# Patient Record
Sex: Female | Born: 1975 | Race: Black or African American | Hispanic: No | Smoking: Never smoker
Health system: Southern US, Community
[De-identification: ages and names within clinical notes are randomized; demographics above are authoritative.]

## PROBLEM LIST (undated history)

## (undated) ENCOUNTER — Inpatient Hospital Stay (HOSPITAL_COMMUNITY): Payer: Self-pay

## (undated) DIAGNOSIS — G8929 Other chronic pain: Secondary | ICD-10-CM

## (undated) DIAGNOSIS — E282 Polycystic ovarian syndrome: Secondary | ICD-10-CM

## (undated) DIAGNOSIS — R42 Dizziness and giddiness: Secondary | ICD-10-CM

## (undated) DIAGNOSIS — D219 Benign neoplasm of connective and other soft tissue, unspecified: Secondary | ICD-10-CM

## (undated) DIAGNOSIS — D373 Neoplasm of uncertain behavior of appendix: Secondary | ICD-10-CM

## (undated) DIAGNOSIS — O009 Unspecified ectopic pregnancy without intrauterine pregnancy: Secondary | ICD-10-CM

## (undated) DIAGNOSIS — N760 Acute vaginitis: Secondary | ICD-10-CM

## (undated) DIAGNOSIS — N939 Abnormal uterine and vaginal bleeding, unspecified: Secondary | ICD-10-CM

## (undated) DIAGNOSIS — T7840XA Allergy, unspecified, initial encounter: Secondary | ICD-10-CM

## (undated) DIAGNOSIS — B029 Zoster without complications: Secondary | ICD-10-CM

## (undated) DIAGNOSIS — A749 Chlamydial infection, unspecified: Secondary | ICD-10-CM

## (undated) DIAGNOSIS — M25562 Pain in left knee: Secondary | ICD-10-CM

## (undated) DIAGNOSIS — IMO0002 Reserved for concepts with insufficient information to code with codable children: Secondary | ICD-10-CM

## (undated) DIAGNOSIS — B9689 Other specified bacterial agents as the cause of diseases classified elsewhere: Secondary | ICD-10-CM

## (undated) HISTORY — PX: OTHER SURGICAL HISTORY: SHX169

## (undated) HISTORY — PX: ECTOPIC PREGNANCY SURGERY: SHX613

## (undated) HISTORY — PX: APPENDECTOMY: SHX54

## (undated) HISTORY — DX: Reserved for concepts with insufficient information to code with codable children: IMO0002

## (undated) HISTORY — PX: EAR TUBE REMOVAL: SHX1486

## (undated) HISTORY — DX: Allergy, unspecified, initial encounter: T78.40XA

## (undated) HISTORY — PX: WISDOM TOOTH EXTRACTION: SHX21

---

## 1998-12-29 ENCOUNTER — Other Ambulatory Visit: Admission: RE | Admit: 1998-12-29 | Discharge: 1998-12-29 | Payer: Self-pay | Admitting: Family Medicine

## 1999-08-21 ENCOUNTER — Other Ambulatory Visit: Admission: RE | Admit: 1999-08-21 | Discharge: 1999-08-21 | Payer: Self-pay | Admitting: Family Medicine

## 1999-10-11 ENCOUNTER — Other Ambulatory Visit: Admission: RE | Admit: 1999-10-11 | Discharge: 1999-10-11 | Payer: Self-pay | Admitting: Obstetrics and Gynecology

## 1999-10-11 ENCOUNTER — Encounter (INDEPENDENT_AMBULATORY_CARE_PROVIDER_SITE_OTHER): Payer: Self-pay

## 2000-01-11 ENCOUNTER — Encounter: Payer: Self-pay | Admitting: *Deleted

## 2000-01-11 ENCOUNTER — Inpatient Hospital Stay (HOSPITAL_COMMUNITY): Admission: AD | Admit: 2000-01-11 | Discharge: 2000-01-11 | Payer: Self-pay | Admitting: Obstetrics & Gynecology

## 2000-03-27 ENCOUNTER — Ambulatory Visit (HOSPITAL_COMMUNITY): Admission: RE | Admit: 2000-03-27 | Discharge: 2000-03-27 | Payer: Self-pay | Admitting: *Deleted

## 2000-03-29 ENCOUNTER — Inpatient Hospital Stay (HOSPITAL_COMMUNITY): Admission: AD | Admit: 2000-03-29 | Discharge: 2000-03-29 | Payer: Self-pay | Admitting: Obstetrics & Gynecology

## 2000-03-30 ENCOUNTER — Inpatient Hospital Stay (HOSPITAL_COMMUNITY): Admission: AD | Admit: 2000-03-30 | Discharge: 2000-03-30 | Payer: Self-pay | Admitting: Obstetrics

## 2000-04-25 ENCOUNTER — Inpatient Hospital Stay (HOSPITAL_COMMUNITY): Admission: AD | Admit: 2000-04-25 | Discharge: 2000-04-27 | Payer: Self-pay | Admitting: *Deleted

## 2001-04-23 ENCOUNTER — Emergency Department (HOSPITAL_COMMUNITY): Admission: EM | Admit: 2001-04-23 | Discharge: 2001-04-23 | Payer: Self-pay | Admitting: Emergency Medicine

## 2001-04-24 ENCOUNTER — Emergency Department (HOSPITAL_COMMUNITY): Admission: EM | Admit: 2001-04-24 | Discharge: 2001-04-24 | Payer: Self-pay | Admitting: Emergency Medicine

## 2001-08-31 ENCOUNTER — Encounter: Payer: Self-pay | Admitting: Obstetrics and Gynecology

## 2001-08-31 ENCOUNTER — Inpatient Hospital Stay (HOSPITAL_COMMUNITY): Admission: AD | Admit: 2001-08-31 | Discharge: 2001-08-31 | Payer: Self-pay | Admitting: *Deleted

## 2001-12-14 ENCOUNTER — Emergency Department (HOSPITAL_COMMUNITY): Admission: EM | Admit: 2001-12-14 | Discharge: 2001-12-14 | Payer: Self-pay | Admitting: Physical Therapy

## 2002-02-10 ENCOUNTER — Emergency Department (HOSPITAL_COMMUNITY): Admission: EM | Admit: 2002-02-10 | Discharge: 2002-02-10 | Payer: Self-pay | Admitting: *Deleted

## 2002-11-22 ENCOUNTER — Emergency Department (HOSPITAL_COMMUNITY): Admission: EM | Admit: 2002-11-22 | Discharge: 2002-11-22 | Payer: Self-pay | Admitting: Emergency Medicine

## 2002-12-30 ENCOUNTER — Encounter: Admission: RE | Admit: 2002-12-30 | Discharge: 2002-12-30 | Payer: Self-pay | Admitting: Family Medicine

## 2003-05-17 ENCOUNTER — Emergency Department (HOSPITAL_COMMUNITY): Admission: EM | Admit: 2003-05-17 | Discharge: 2003-05-17 | Payer: Self-pay | Admitting: Emergency Medicine

## 2004-02-18 ENCOUNTER — Emergency Department (HOSPITAL_COMMUNITY): Admission: EM | Admit: 2004-02-18 | Discharge: 2004-02-19 | Payer: Self-pay | Admitting: Emergency Medicine

## 2004-05-18 ENCOUNTER — Emergency Department (HOSPITAL_COMMUNITY): Admission: EM | Admit: 2004-05-18 | Discharge: 2004-05-18 | Payer: Self-pay | Admitting: Emergency Medicine

## 2004-09-05 ENCOUNTER — Inpatient Hospital Stay (HOSPITAL_COMMUNITY): Admission: AD | Admit: 2004-09-05 | Discharge: 2004-09-06 | Payer: Self-pay | Admitting: Family Medicine

## 2004-10-27 ENCOUNTER — Emergency Department (HOSPITAL_COMMUNITY): Admission: EM | Admit: 2004-10-27 | Discharge: 2004-10-27 | Payer: Self-pay | Admitting: Emergency Medicine

## 2004-12-03 ENCOUNTER — Emergency Department (HOSPITAL_COMMUNITY): Admission: EM | Admit: 2004-12-03 | Discharge: 2004-12-03 | Payer: Self-pay | Admitting: Emergency Medicine

## 2005-02-23 ENCOUNTER — Emergency Department (HOSPITAL_COMMUNITY): Admission: EM | Admit: 2005-02-23 | Discharge: 2005-02-24 | Payer: Self-pay | Admitting: Emergency Medicine

## 2005-04-02 ENCOUNTER — Emergency Department (HOSPITAL_COMMUNITY): Admission: EM | Admit: 2005-04-02 | Discharge: 2005-04-02 | Payer: Self-pay | Admitting: *Deleted

## 2005-04-06 ENCOUNTER — Emergency Department (HOSPITAL_COMMUNITY): Admission: EM | Admit: 2005-04-06 | Discharge: 2005-04-06 | Payer: Self-pay | Admitting: *Deleted

## 2005-06-26 ENCOUNTER — Emergency Department (HOSPITAL_COMMUNITY): Admission: EM | Admit: 2005-06-26 | Discharge: 2005-06-26 | Payer: Self-pay | Admitting: Emergency Medicine

## 2005-09-13 ENCOUNTER — Emergency Department (HOSPITAL_COMMUNITY): Admission: EM | Admit: 2005-09-13 | Discharge: 2005-09-13 | Payer: Self-pay | Admitting: Emergency Medicine

## 2005-09-20 ENCOUNTER — Ambulatory Visit: Payer: Self-pay | Admitting: Internal Medicine

## 2005-10-21 ENCOUNTER — Emergency Department (HOSPITAL_COMMUNITY): Admission: EM | Admit: 2005-10-21 | Discharge: 2005-10-22 | Payer: Self-pay | Admitting: Emergency Medicine

## 2006-02-27 ENCOUNTER — Ambulatory Visit: Payer: Self-pay | Admitting: Internal Medicine

## 2006-02-27 ENCOUNTER — Encounter (INDEPENDENT_AMBULATORY_CARE_PROVIDER_SITE_OTHER): Payer: Self-pay | Admitting: Pulmonary Disease

## 2006-02-27 ENCOUNTER — Encounter (INDEPENDENT_AMBULATORY_CARE_PROVIDER_SITE_OTHER): Payer: Self-pay | Admitting: Specialist

## 2006-02-27 LAB — CONVERTED CEMR LAB
Candida species: NEGATIVE
GC Probe Amp, Genital: NEGATIVE
Gardnerella vaginalis: POSITIVE — AB
Trichomonal Vaginitis: NEGATIVE

## 2006-03-01 ENCOUNTER — Emergency Department (HOSPITAL_COMMUNITY): Admission: EM | Admit: 2006-03-01 | Discharge: 2006-03-01 | Payer: Self-pay | Admitting: Emergency Medicine

## 2006-03-18 ENCOUNTER — Ambulatory Visit: Payer: Self-pay | Admitting: Hospitalist

## 2006-03-18 ENCOUNTER — Encounter (INDEPENDENT_AMBULATORY_CARE_PROVIDER_SITE_OTHER): Payer: Self-pay | Admitting: Unknown Physician Specialty

## 2006-03-18 LAB — CONVERTED CEMR LAB: Preg, Serum: NEGATIVE

## 2006-05-16 DIAGNOSIS — H6690 Otitis media, unspecified, unspecified ear: Secondary | ICD-10-CM | POA: Insufficient documentation

## 2006-06-10 DIAGNOSIS — N760 Acute vaginitis: Secondary | ICD-10-CM | POA: Insufficient documentation

## 2006-09-02 ENCOUNTER — Emergency Department (HOSPITAL_COMMUNITY): Admission: EM | Admit: 2006-09-02 | Discharge: 2006-09-02 | Payer: Self-pay | Admitting: Emergency Medicine

## 2006-10-02 ENCOUNTER — Emergency Department (HOSPITAL_COMMUNITY): Admission: EM | Admit: 2006-10-02 | Discharge: 2006-10-02 | Payer: Self-pay | Admitting: Emergency Medicine

## 2006-12-10 ENCOUNTER — Encounter (INDEPENDENT_AMBULATORY_CARE_PROVIDER_SITE_OTHER): Payer: Self-pay | Admitting: Nurse Practitioner

## 2006-12-10 ENCOUNTER — Ambulatory Visit: Payer: Self-pay | Admitting: Internal Medicine

## 2006-12-11 ENCOUNTER — Ambulatory Visit: Payer: Self-pay | Admitting: *Deleted

## 2007-04-10 ENCOUNTER — Emergency Department (HOSPITAL_COMMUNITY): Admission: EM | Admit: 2007-04-10 | Discharge: 2007-04-11 | Payer: Self-pay | Admitting: Emergency Medicine

## 2007-06-17 ENCOUNTER — Emergency Department (HOSPITAL_COMMUNITY): Admission: EM | Admit: 2007-06-17 | Discharge: 2007-06-17 | Payer: Self-pay | Admitting: Emergency Medicine

## 2007-07-12 ENCOUNTER — Emergency Department (HOSPITAL_COMMUNITY): Admission: EM | Admit: 2007-07-12 | Discharge: 2007-07-12 | Payer: Self-pay | Admitting: Emergency Medicine

## 2007-07-30 ENCOUNTER — Emergency Department (HOSPITAL_COMMUNITY): Admission: EM | Admit: 2007-07-30 | Discharge: 2007-07-30 | Payer: Self-pay | Admitting: Emergency Medicine

## 2007-12-26 ENCOUNTER — Emergency Department (HOSPITAL_COMMUNITY): Admission: EM | Admit: 2007-12-26 | Discharge: 2007-12-26 | Payer: Self-pay | Admitting: Emergency Medicine

## 2008-01-05 ENCOUNTER — Emergency Department (HOSPITAL_COMMUNITY): Admission: EM | Admit: 2008-01-05 | Discharge: 2008-01-05 | Payer: Self-pay | Admitting: Emergency Medicine

## 2008-01-09 ENCOUNTER — Ambulatory Visit: Payer: Self-pay | Admitting: Internal Medicine

## 2008-01-10 ENCOUNTER — Encounter (INDEPENDENT_AMBULATORY_CARE_PROVIDER_SITE_OTHER): Payer: Self-pay | Admitting: Internal Medicine

## 2008-03-17 ENCOUNTER — Emergency Department (HOSPITAL_COMMUNITY): Admission: EM | Admit: 2008-03-17 | Discharge: 2008-03-17 | Payer: Self-pay | Admitting: Emergency Medicine

## 2008-08-30 DIAGNOSIS — IMO0002 Reserved for concepts with insufficient information to code with codable children: Secondary | ICD-10-CM

## 2008-08-30 HISTORY — DX: Reserved for concepts with insufficient information to code with codable children: IMO0002

## 2008-11-03 ENCOUNTER — Ambulatory Visit: Payer: Self-pay | Admitting: Obstetrics and Gynecology

## 2008-11-03 ENCOUNTER — Encounter: Payer: Self-pay | Admitting: Obstetrics & Gynecology

## 2008-11-03 ENCOUNTER — Other Ambulatory Visit: Admission: RE | Admit: 2008-11-03 | Discharge: 2008-11-03 | Payer: Self-pay | Admitting: Obstetrics and Gynecology

## 2008-11-03 LAB — CONVERTED CEMR LAB: GC Probe Amp, Genital: NEGATIVE

## 2008-12-03 ENCOUNTER — Ambulatory Visit: Payer: Self-pay | Admitting: Obstetrics & Gynecology

## 2009-01-31 ENCOUNTER — Emergency Department (HOSPITAL_COMMUNITY): Admission: EM | Admit: 2009-01-31 | Discharge: 2009-01-31 | Payer: Self-pay | Admitting: Emergency Medicine

## 2009-02-05 ENCOUNTER — Emergency Department (HOSPITAL_COMMUNITY): Admission: EM | Admit: 2009-02-05 | Discharge: 2009-02-05 | Payer: Self-pay | Admitting: Emergency Medicine

## 2009-02-23 ENCOUNTER — Ambulatory Visit: Payer: Self-pay | Admitting: Obstetrics and Gynecology

## 2009-02-24 ENCOUNTER — Encounter: Payer: Self-pay | Admitting: Obstetrics and Gynecology

## 2009-02-24 LAB — CONVERTED CEMR LAB
Trich, Wet Prep: NONE SEEN
Yeast Wet Prep HPF POC: NONE SEEN

## 2009-03-17 ENCOUNTER — Ambulatory Visit: Payer: Self-pay | Admitting: Obstetrics and Gynecology

## 2009-03-18 ENCOUNTER — Encounter: Payer: Self-pay | Admitting: Obstetrics and Gynecology

## 2009-03-18 LAB — CONVERTED CEMR LAB
Chlamydia, DNA Probe: NEGATIVE
HCV Ab: NEGATIVE
Hepatitis B Surface Ag: NEGATIVE

## 2009-03-19 ENCOUNTER — Encounter: Payer: Self-pay | Admitting: Obstetrics and Gynecology

## 2009-03-19 LAB — CONVERTED CEMR LAB: Yeast Wet Prep HPF POC: NONE SEEN

## 2009-04-29 ENCOUNTER — Emergency Department (HOSPITAL_COMMUNITY): Admission: EM | Admit: 2009-04-29 | Discharge: 2009-04-29 | Payer: Self-pay | Admitting: Emergency Medicine

## 2009-05-14 HISTORY — PX: OTHER SURGICAL HISTORY: SHX169

## 2009-08-11 ENCOUNTER — Ambulatory Visit: Payer: Self-pay | Admitting: Obstetrics and Gynecology

## 2009-08-11 LAB — CONVERTED CEMR LAB: Pap Smear: NEGATIVE

## 2009-08-12 ENCOUNTER — Encounter: Payer: Self-pay | Admitting: Obstetrics and Gynecology

## 2009-08-12 LAB — CONVERTED CEMR LAB
Trich, Wet Prep: NONE SEEN
Yeast Wet Prep HPF POC: NONE SEEN

## 2009-08-31 ENCOUNTER — Emergency Department (HOSPITAL_COMMUNITY): Admission: EM | Admit: 2009-08-31 | Discharge: 2009-08-31 | Payer: Self-pay | Admitting: Emergency Medicine

## 2009-11-03 ENCOUNTER — Emergency Department (HOSPITAL_COMMUNITY): Admission: EM | Admit: 2009-11-03 | Discharge: 2009-11-03 | Payer: Self-pay | Admitting: Emergency Medicine

## 2009-11-23 ENCOUNTER — Emergency Department (HOSPITAL_COMMUNITY): Admission: EM | Admit: 2009-11-23 | Discharge: 2009-11-23 | Payer: Self-pay | Admitting: Emergency Medicine

## 2010-03-22 ENCOUNTER — Ambulatory Visit: Payer: Self-pay | Admitting: Obstetrics and Gynecology

## 2010-03-24 ENCOUNTER — Ambulatory Visit (HOSPITAL_COMMUNITY): Admission: RE | Admit: 2010-03-24 | Discharge: 2010-03-24 | Payer: Self-pay | Admitting: Family Medicine

## 2010-04-04 ENCOUNTER — Emergency Department (HOSPITAL_COMMUNITY): Admission: EM | Admit: 2010-04-04 | Discharge: 2010-04-04 | Payer: Self-pay | Admitting: Emergency Medicine

## 2010-04-16 ENCOUNTER — Emergency Department (HOSPITAL_COMMUNITY)
Admission: EM | Admit: 2010-04-16 | Discharge: 2010-04-16 | Payer: Self-pay | Source: Home / Self Care | Admitting: Emergency Medicine

## 2010-05-10 ENCOUNTER — Encounter (INDEPENDENT_AMBULATORY_CARE_PROVIDER_SITE_OTHER): Payer: Self-pay | Admitting: *Deleted

## 2010-05-10 ENCOUNTER — Ambulatory Visit: Payer: Self-pay | Admitting: Obstetrics and Gynecology

## 2010-05-10 LAB — CONVERTED CEMR LAB
HIV: NONREACTIVE
Hepatitis B Surface Ag: NEGATIVE
Yeast Wet Prep HPF POC: NONE SEEN

## 2010-05-11 ENCOUNTER — Encounter: Payer: Self-pay | Admitting: Obstetrics and Gynecology

## 2010-06-26 ENCOUNTER — Encounter: Payer: Self-pay | Admitting: Obstetrics and Gynecology

## 2010-06-26 ENCOUNTER — Other Ambulatory Visit: Payer: Self-pay

## 2010-06-26 ENCOUNTER — Ambulatory Visit (INDEPENDENT_AMBULATORY_CARE_PROVIDER_SITE_OTHER): Payer: Medicaid Other | Admitting: Occupational Therapy

## 2010-06-26 ENCOUNTER — Encounter: Payer: Self-pay | Admitting: Physician Assistant

## 2010-06-26 DIAGNOSIS — R5383 Other fatigue: Secondary | ICD-10-CM

## 2010-06-26 DIAGNOSIS — N912 Amenorrhea, unspecified: Secondary | ICD-10-CM

## 2010-06-26 DIAGNOSIS — R635 Abnormal weight gain: Secondary | ICD-10-CM

## 2010-06-26 DIAGNOSIS — R5381 Other malaise: Secondary | ICD-10-CM

## 2010-06-26 LAB — CONVERTED CEMR LAB
FSH: 2.9 milliintl units/mL
HCT: 38.4 % (ref 36.0–46.0)
Hemoglobin: 12.8 g/dL (ref 12.0–15.0)
LH: <0.1 milliintl units/mL
RBC: 4.5 M/uL (ref 3.87–5.11)
RDW: 14.3 % (ref 11.5–15.5)

## 2010-06-26 LAB — GLUCOSE, CAPILLARY: Glucose-Capillary: 111 mg/dL — ABNORMAL HIGH (ref 70–99)

## 2010-06-26 LAB — POCT PREGNANCY, URINE: Preg Test, Ur: NEGATIVE

## 2010-06-27 ENCOUNTER — Encounter: Payer: Self-pay | Admitting: Obstetrics and Gynecology

## 2010-06-27 LAB — CONVERTED CEMR LAB
Trich, Wet Prep: NONE SEEN
Yeast Wet Prep HPF POC: NONE SEEN

## 2010-07-13 ENCOUNTER — Emergency Department (HOSPITAL_COMMUNITY): Payer: Medicaid Other

## 2010-07-13 ENCOUNTER — Emergency Department (HOSPITAL_COMMUNITY)
Admission: EM | Admit: 2010-07-13 | Discharge: 2010-07-13 | Disposition: A | Discharge status: Home / Self Care | Payer: Medicaid Other | Attending: Emergency Medicine | Admitting: Emergency Medicine

## 2010-07-13 DIAGNOSIS — R5381 Other malaise: Secondary | ICD-10-CM | POA: Insufficient documentation

## 2010-07-13 DIAGNOSIS — J069 Acute upper respiratory infection, unspecified: Secondary | ICD-10-CM | POA: Insufficient documentation

## 2010-07-13 DIAGNOSIS — R509 Fever, unspecified: Secondary | ICD-10-CM | POA: Insufficient documentation

## 2010-07-13 DIAGNOSIS — H9209 Otalgia, unspecified ear: Secondary | ICD-10-CM | POA: Insufficient documentation

## 2010-07-13 DIAGNOSIS — J3489 Other specified disorders of nose and nasal sinuses: Secondary | ICD-10-CM | POA: Insufficient documentation

## 2010-07-13 DIAGNOSIS — R110 Nausea: Secondary | ICD-10-CM | POA: Insufficient documentation

## 2010-07-13 DIAGNOSIS — R0989 Other specified symptoms and signs involving the circulatory and respiratory systems: Secondary | ICD-10-CM | POA: Insufficient documentation

## 2010-07-13 DIAGNOSIS — R070 Pain in throat: Secondary | ICD-10-CM | POA: Insufficient documentation

## 2010-07-13 DIAGNOSIS — R059 Cough, unspecified: Secondary | ICD-10-CM | POA: Insufficient documentation

## 2010-07-13 DIAGNOSIS — R0609 Other forms of dyspnea: Secondary | ICD-10-CM | POA: Insufficient documentation

## 2010-07-13 DIAGNOSIS — R51 Headache: Secondary | ICD-10-CM | POA: Insufficient documentation

## 2010-07-13 DIAGNOSIS — R5383 Other fatigue: Secondary | ICD-10-CM | POA: Insufficient documentation

## 2010-07-13 DIAGNOSIS — R05 Cough: Secondary | ICD-10-CM | POA: Insufficient documentation

## 2010-07-13 DIAGNOSIS — B9789 Other viral agents as the cause of diseases classified elsewhere: Secondary | ICD-10-CM | POA: Insufficient documentation

## 2010-07-13 LAB — CBC
HCT: 39 % (ref 36.0–46.0)
MCH: 27.5 pg (ref 26.0–34.0)
MCV: 84.4 fL (ref 78.0–100.0)
RBC: 4.62 MIL/uL (ref 3.87–5.11)
RDW: 14 % (ref 11.5–15.5)
WBC: 5 10*3/uL (ref 4.0–10.5)

## 2010-07-13 LAB — DIFFERENTIAL
Eosinophils Relative: 5 % (ref 0–5)
Lymphocytes Relative: 42 % (ref 12–46)
Lymphs Abs: 2.1 10*3/uL (ref 0.7–4.0)
Monocytes Relative: 10 % (ref 3–12)

## 2010-07-13 LAB — POCT I-STAT, CHEM 8
BUN: 20 mg/dL (ref 6–23)
Chloride: 106 mEq/L (ref 96–112)
Glucose, Bld: 84 mg/dL (ref 70–99)
HCT: 41 % (ref 36.0–46.0)
Potassium: 4.4 mEq/L (ref 3.5–5.1)

## 2010-07-20 ENCOUNTER — Ambulatory Visit (INDEPENDENT_AMBULATORY_CARE_PROVIDER_SITE_OTHER): Payer: Medicaid Other | Admitting: Physician Assistant

## 2010-07-20 DIAGNOSIS — E282 Polycystic ovarian syndrome: Secondary | ICD-10-CM

## 2010-07-21 ENCOUNTER — Ambulatory Visit: Payer: Self-pay | Admitting: Family Medicine

## 2010-07-30 LAB — CBC
Hemoglobin: 12.9 g/dL (ref 12.0–15.0)
MCH: 29.4 pg (ref 26.0–34.0)
MCV: 88.5 fL (ref 78.0–100.0)
RBC: 4.38 MIL/uL (ref 3.87–5.11)

## 2010-07-30 LAB — POCT I-STAT, CHEM 8
Calcium, Ion: 1.1 mmol/L — ABNORMAL LOW (ref 1.12–1.32)
Creatinine, Ser: 0.9 mg/dL (ref 0.4–1.2)
Glucose, Bld: 88 mg/dL (ref 70–99)
Hemoglobin: 13.9 g/dL (ref 12.0–15.0)
TCO2: 27 mmol/L (ref 0–100)

## 2010-07-30 LAB — DIFFERENTIAL
Eosinophils Absolute: 0.1 10*3/uL (ref 0.0–0.7)
Eosinophils Relative: 3 % (ref 0–5)
Lymphs Abs: 1.9 10*3/uL (ref 0.7–4.0)
Monocytes Relative: 8 % (ref 3–12)

## 2010-08-04 NOTE — Progress Notes (Signed)
NAME:  Melissa [name], Melissa [name]               ACCOUNT NO.:  [id number]  MEDICAL RECORD NO.:  [id number]           PATIENT TYPE:  A  [facility]ATION[facility]                   FACILITY:  [facility]  PHYSICIAN:  Maylon Cos, CNM    DATE OF BIRTH:  [birth year]  DATE OF SERVICE:  07/20/2010                                 CLINIC NOTE  The patient is being seen in GYN Clinic at [facility].  Reason for today's visit is 2-week followup for results.  HISTORY OF PRESENT ILLNESS:  The patient is a 35 year old gravida 2, para 1-0-1-1 who originally presented to me 1 month ago with complaints of amenorrhea, weight gain, and fatigue.  At that time, labs were drawn to assess for PCOS.  The patient is strongly desiring pregnancy.  She was given Provera 10 mg for 10 days for withdrawal bleed.  She reports that she did have spotting that lasted for 2 days after she finished the Provera and none since.  The labs that were drawn are resulted as follow:  She had a wet prep that showed many clues.  Gonorrhea and Chlamydia were negative x2.  Her hemoglobin is 12.8, hematocrit is 38.4, her platelets are 309.  Her TSH is 1.5.  Her FSH is 2.9 and LH is less than 0.1.  I have discussed these results with the patient today and the diagnosis is in fact leading towards PCOS as we did suspect.  She is extensively counselled on diet modification due to insulin sensitivity, need for weight loss, and to start medications of metformin.  She is in agreement with this plan.  ASSESSMENT: 1. Polycystic ovary syndrome. 2. Amenorrhea. 3. Weight gain. 4. Fatigue.  PLAN:  The patient is handed information on PCOS, diabetic diet, and exercise today.  Additionally, the patient is being started on metformin 500 mg p.o. at bedtime x7 days and increasing to 500 mg twice a day x10 days with the ultimate goal of increasing to 1000 mg twice a day.  FOLLOWUP:  The patient should follow up in 2 months to assess her weight loss as well as  regulation of her menstrual cycles.  Additionally, the patient is also scheduled to follow up for repeat Pap secondary to history of abnormal Pap.  We will conduct both of those visits into the next with repeat Pap and follow up.  The patient should follow up p.r.n. prior to that if she should have problems.          ______________________________ Maylon Cos, CNM    SS/MEDQ  D:  07/20/2010  T:  07/21/2010  Job:  [id number]

## 2010-08-04 NOTE — Progress Notes (Signed)
NAME:  Melissa [name], Melissa [name]               ACCOUNT NO.:  [id number]  MEDICAL RECORD NO.:  [id number]           PATIENT TYPE:  A  [facility]                   FACILITY:  [facility]  PHYSICIAN:  Maylon Cos, CNM    DATE OF BIRTH:  04/30/1976  DATE OF SERVICE:  06/26/2010                                 CLINIC NOTE  The patient is being seen in GYN Clinic at [facility].  REASON FOR TODAY'S VISIT:  Pelvic pain, extreme fatigue, increased appetite and weight gain.  HISTORY OF PRESENT ILLNESS:  The patient is a 35 year old, gravida 2, para 1-0-1-1, whose last menstrual period was in October 2011.  She presents with complaints that started about a week ago for pelvic pain that is similar to when she was here in November and diagnosed with a resolving corpus luteum cyst on her left ovary.  She states that the pain is in her lower left side.  It first began a week ago, that is crampy and sharp at the same time.  It was not relieved by 800 mg of ibuprofen; however, her pain has resolved and lessened over the last 7 days, and she is really not feeling pain today.  Her main complaint today is over the last month she has had extreme amounts of fatigue, increased weight gain, and increased appetite.  She states that she is sleeping most hours of the day, she took a 2-hour nap prior to her visit today and is sleepy during her visit.  She is not currently on any medications.  She was given a Depo-Provera shot for presumed endometriosis in November of last year and has not received any since then.  She has not resumed having periods since having the Depo initiated in November.  PHYSICAL EXAMINATION:  GENERAL:  Shera is a morbidly obese African American female who appears to be her stated age. HEENT:  Hirsutism on her chin. NECK:  She may have slight enlargement of the left side of her thyroid gland, is nontender.  No nodules are palpated. ABDOMEN:  Large and obese and nontender to  palpation.  No hepatosplenomegaly. GENITOURINARY:  On examination, mucous membranes are pink with scant amount of mucousy discharge without odor, irregular rugae with moderate tone.  Cervix is pink and parous without lesions.  It is nonfriable. Bimanual exam is limited by the patient's habitus; however, it is nontender on examination.  Ovaries are palpated bilaterally and there is no enlargement on examination and they are nontender.  Urine pregnancy test today is negative.  ASSESSMENT: 1. Amenorrhea. 2. Weight gain. 3. Fatigue.  PLAN:  We will obtain labs today assessing for PCOS and also assessing her thyroid for causes of her weight gain, fatigue, and amenorrhea. Additionally, the patient is being given a prescription for Provera 10 times ten days to see if we can elicit a withdrawal bleed.  The patient is in much desire of a future pregnancy and is not interested in any hormonal treatment if this is PCOS.  I have discussed with her diet modifications. She is in agreement with the plan today and will return in 2 weeks for the results of her  labs and to see if she was able to have a withdrawal bleed on the Provera challenge.          ______________________________ Maylon Cos, CNM    SS/MEDQ  D:  06/26/2010  T:  06/27/2010  Job:  [id number]

## 2010-08-17 ENCOUNTER — Other Ambulatory Visit: Payer: Self-pay | Admitting: Advanced Practice Midwife

## 2010-08-17 ENCOUNTER — Ambulatory Visit (INDEPENDENT_AMBULATORY_CARE_PROVIDER_SITE_OTHER): Payer: Medicaid Other | Admitting: Advanced Practice Midwife

## 2010-08-17 DIAGNOSIS — Z01419 Encounter for gynecological examination (general) (routine) without abnormal findings: Secondary | ICD-10-CM

## 2010-08-18 NOTE — Progress Notes (Unsigned)
NAME:  Melissa [name], Melissa [name]               ACCOUNT NO.:  [id number]  MEDICAL RECORD NO.:  [id number]           PATIENT TYPE:  A  [facility]ATION[facility]                   FACILITY:  [facility]  PHYSICIAN:  Wynelle Bourgeois, CNM    DATE OF BIRTH:  1975-07-05  DATE OF SERVICE:  08/17/2010                                 CLINIC NOTE  This is a 35 year old gravida 2, para 1-0-1-1, who presents for an annual exam today.  She was last seen by Maylon Cos a month ago for test results, which showed polycystic ovarian syndrome.  She has had persistent oligomenorrhea with her last period being in October 2011. She has been trying to get pregnant over the past year without success because she has such infrequent periods.  She states that she has trouble exercising because her metformin makes her very sleepy.  She has no other symptoms to report other than her oligomenorrhea.  She does state that she feels like she had a cyst on her left ovary with intermittent pain there.  PHYSICAL EXAMINATION:  Temperature 97.8, pulse 73, blood pressure 128/75, weight 108.5, height 65 inches.  Weight l08.5 kg, she has lost 2 pounds in the last few weeks and 6 pounds over the last month.  ALLERGIES:  None.  History per previous notes.  HEENT:  Shows hirsutism on her chin. Thyroid normal, not enlarged.  Chest:  Clear to auscultation.  Heart, rate regular rate and rhythm.  Abdomen:  Obese, nontender.  No masses. Pelvic exam shows BUS within normal limits with normal vaginal rugae. Cervix is multiparous and closed and long.  Uterus is small and firm without masses.  Adnexa, nontender without masses, but difficult to palpate secondary to habitus.  Urine pregnancy test pending.  ASSESSMENT: 1. Polycystic ovarian syndrome. 2. Oligomenorrhea. 3. Obesity. 4. Fatigue.  PLAN: 1. Pap sent. 2. Declines cultures. 3. Continue metformin. 4. The patient expresses interest in proceeding with fertility     treatment including  Clomid.  We discussed that she needs to come to     the Infertility Clinic for that and we will discuss costs with the     fertility staff.          ______________________________ Wynelle Bourgeois, CNM   MW/MEDQ  D:  08/17/2010  T:  08/18/2010  Job:  [id number]

## 2010-09-27 ENCOUNTER — Ambulatory Visit: Payer: Self-pay | Admitting: Physician Assistant

## 2010-10-16 ENCOUNTER — Inpatient Hospital Stay (HOSPITAL_COMMUNITY)
Admission: AD | Admit: 2010-10-16 | Discharge: 2010-10-16 | Disposition: A | Discharge status: Home / Self Care | Payer: Medicaid Other | Source: Ambulatory Visit | Attending: Obstetrics & Gynecology | Admitting: Obstetrics & Gynecology

## 2010-10-16 DIAGNOSIS — N390 Urinary tract infection, site not specified: Secondary | ICD-10-CM

## 2010-10-16 DIAGNOSIS — R300 Dysuria: Secondary | ICD-10-CM

## 2010-10-16 LAB — URINE MICROSCOPIC-ADD ON

## 2010-10-16 LAB — DIFFERENTIAL
Basophils Absolute: 0 10*3/uL (ref 0.0–0.1)
Basophils Relative: 1 % (ref 0–1)
Monocytes Absolute: 0.4 10*3/uL (ref 0.1–1.0)
Neutro Abs: 2.3 10*3/uL (ref 1.7–7.7)

## 2010-10-16 LAB — POCT PREGNANCY, URINE: Preg Test, Ur: NEGATIVE

## 2010-10-16 LAB — URINALYSIS, ROUTINE W REFLEX MICROSCOPIC
Nitrite: POSITIVE — AB
Specific Gravity, Urine: >1.03 — ABNORMAL HIGH (ref 1.005–1.030)
pH: 5 (ref 5.0–8.0)

## 2010-10-16 LAB — CBC
Hemoglobin: 13.9 g/dL (ref 12.0–15.0)
MCHC: 32.7 g/dL (ref 30.0–36.0)
Platelets: 325 10*3/uL (ref 150–400)
RDW: 15 % (ref 11.5–15.5)

## 2010-10-19 ENCOUNTER — Ambulatory Visit (INDEPENDENT_AMBULATORY_CARE_PROVIDER_SITE_OTHER): Payer: Medicaid Other | Admitting: Physician Assistant

## 2010-10-19 DIAGNOSIS — E282 Polycystic ovarian syndrome: Secondary | ICD-10-CM

## 2010-10-19 DIAGNOSIS — N912 Amenorrhea, unspecified: Secondary | ICD-10-CM

## 2010-10-20 NOTE — Group Therapy Note (Signed)
NAME:  Melissa [name], Melissa [name]               ACCOUNT NO.:  [id number]  MEDICAL RECORD NO.:  [id number]           PATIENT TYPE:  A  [facility]                   [facility]:  [facility]  PHYSICIAN:  Maylon Cos, CNM    DATE OF BIRTH:  [birth year]  DATE OF SERVICE:  10/19/2010                                 CLINIC NOTE  REASON FOR TODAY'S VISIT:  Discuss medications.  HISTORY OF PRESENT ILLNESS:  The patient is a 35 year old gravida 2, para 1-0-1-1, who I had originally seen several months ago for amenorrhea and fatigue and I have given her the diagnosis of PCOS.  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  At her last visit in March, the patient was started on metformin 500 mg p.o. at bedtime and it was to increase her dose to a max dose of 1500 mg, 500 mg 3 times a day for a total of 1500 mg in hopes of treating her amenorrhea, anovulatory cycles.  The patient returns today stating that she did have a period.  She had been taking the medications with some GI upset.  She did have a period on September 07, 2010; however, she stopped taking the medications and did not have a period again in May and had a questionable period beginning of June as she had brown spotting that only lasted a couple of days.  She is very hopeful for the use of metformin and would thus desire to continue.  PHYSICAL EXAMINATION:  GENERAL:  [name] is a pleasant African American female who appears her stated age of 54.  She is in no apparent distress. HEENT:  Grossly normal. NEUROLOGICAL:  Cranial nerves are intact grossly and the patient is appropriate during the course of our discussion.  MEDICATION CONSULT:  We have discussed that we are likely on the right track saying that she did achieve a period when she was on the medications and I strongly recommend restarting those medications. Given the patient's hormone levels during her last  visit and LH being less than 0.1, I do recommend once that we are achieving a period that we start Clomid.  The patient agrees as she is strongly desiring pregnancy.  We have also again discussed need for her weight loss and that will aid in this process as well.  ASSESSMENT: 1. Polycystic ovarian syndrome. 2. Oligomenorrhea. 3. Obesity. 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  PLAN: 1. Prescription given for metformin 500 mg to be taken at bedtime and     increase to 500 mg t.i.d. to achieve period. 2. We have also given a prescription for Clomid as the infertility     treatment was 50 mg on days 5 through 9.  Instruction sheet is     given and the patient should follow up in Infertility Clinic in the     next 2 months if     pregnancy has not been achieved so we can dose these medications,     maximum dosing of Clomid in our clinic will go up to 150 mg daily     on  days 5 through 9.  The patient understands this and agrees with     plan.          ______________________________ Maylon Cos, CNM    SS/MEDQ  D:  10/19/2010  T:  10/20/2010  Job:  [id number]

## 2010-12-21 ENCOUNTER — Encounter: Payer: Self-pay | Admitting: *Deleted

## 2010-12-21 DIAGNOSIS — IMO0002 Reserved for concepts with insufficient information to code with codable children: Secondary | ICD-10-CM | POA: Insufficient documentation

## 2010-12-21 DIAGNOSIS — E669 Obesity, unspecified: Secondary | ICD-10-CM

## 2010-12-21 DIAGNOSIS — E282 Polycystic ovarian syndrome: Secondary | ICD-10-CM

## 2010-12-21 DIAGNOSIS — R87612 Low grade squamous intraepithelial lesion on cytologic smear of cervix (LGSIL): Secondary | ICD-10-CM | POA: Insufficient documentation

## 2010-12-21 DIAGNOSIS — N912 Amenorrhea, unspecified: Secondary | ICD-10-CM | POA: Insufficient documentation

## 2010-12-21 DIAGNOSIS — R5383 Other fatigue: Secondary | ICD-10-CM

## 2010-12-28 ENCOUNTER — Ambulatory Visit: Payer: Medicaid Other | Admitting: Physician Assistant

## 2011-01-24 ENCOUNTER — Ambulatory Visit: Payer: Self-pay | Admitting: Advanced Practice Midwife

## 2011-02-02 LAB — URINALYSIS, ROUTINE W REFLEX MICROSCOPIC
Glucose, UA: NEGATIVE
Glucose, UA: NEGATIVE
Hgb urine dipstick: NEGATIVE
Nitrite: NEGATIVE
Protein, ur: NEGATIVE
Specific Gravity, Urine: 1.028
Urobilinogen, UA: 1
pH: 6.5

## 2011-02-02 LAB — URINE MICROSCOPIC-ADD ON

## 2011-02-05 LAB — I-STAT 8, (EC8 V) (CONVERTED LAB)
BUN: 13
Bicarbonate: 27.4 — ABNORMAL HIGH
Chloride: 107
Glucose, Bld: 86
HCT: 44
Hemoglobin: 15
Sodium: 138

## 2011-02-05 LAB — CBC
HCT: 39.7
MCV: 87.8
RBC: 4.52
WBC: 4.4

## 2011-02-05 LAB — URINALYSIS, ROUTINE W REFLEX MICROSCOPIC
Bilirubin Urine: NEGATIVE
Glucose, UA: NEGATIVE
Hgb urine dipstick: NEGATIVE
Protein, ur: NEGATIVE
Urobilinogen, UA: 1

## 2011-02-05 LAB — DIFFERENTIAL
Eosinophils Absolute: 0.2
Eosinophils Relative: 4
Lymphocytes Relative: 31
Lymphs Abs: 1.4
Monocytes Relative: 10

## 2011-02-05 LAB — POCT I-STAT CREATININE
Creatinine, Ser: 0.9
Operator id: 146091

## 2011-02-13 LAB — URINALYSIS, ROUTINE W REFLEX MICROSCOPIC
Glucose, UA: NEGATIVE
Nitrite: NEGATIVE
Specific Gravity, Urine: 1.027
pH: 5.5

## 2011-02-13 LAB — URINE CULTURE

## 2011-02-13 LAB — URINE MICROSCOPIC-ADD ON

## 2011-02-13 LAB — POCT PREGNANCY, URINE: Preg Test, Ur: NEGATIVE

## 2011-02-16 ENCOUNTER — Ambulatory Visit: Payer: Self-pay | Admitting: Obstetrics and Gynecology

## 2011-03-22 ENCOUNTER — Ambulatory Visit (INDEPENDENT_AMBULATORY_CARE_PROVIDER_SITE_OTHER): Payer: Medicaid Other | Admitting: Physician Assistant

## 2011-03-22 ENCOUNTER — Encounter: Payer: Self-pay | Admitting: Physician Assistant

## 2011-03-22 DIAGNOSIS — F411 Generalized anxiety disorder: Secondary | ICD-10-CM

## 2011-03-22 DIAGNOSIS — F419 Anxiety disorder, unspecified: Secondary | ICD-10-CM

## 2011-03-22 DIAGNOSIS — R55 Syncope and collapse: Secondary | ICD-10-CM

## 2011-03-22 LAB — COMPREHENSIVE METABOLIC PANEL
BUN: 16 mg/dL (ref 6–23)
CO2: 26 mEq/L (ref 19–32)
Calcium: 9 mg/dL (ref 8.4–10.5)
Chloride: 106 mEq/L (ref 96–112)
Creat: 0.88 mg/dL (ref 0.50–1.10)
Total Bilirubin: 0.3 mg/dL (ref 0.3–1.2)

## 2011-03-22 LAB — CBC
HCT: 38.9 % (ref 36.0–46.0)
MCH: 27.9 pg (ref 26.0–34.0)
MCV: 87.4 fL (ref 78.0–100.0)
RBC: 4.45 MIL/uL (ref 3.87–5.11)
WBC: 4.6 10*3/uL (ref 4.0–10.5)

## 2011-03-22 LAB — POCT PREGNANCY, URINE: Preg Test, Ur: NEGATIVE

## 2011-03-22 MED ORDER — ALPRAZOLAM 0.5 MG PO TABS: 0.5000 mg | ORAL_TABLET | Freq: Three times a day (TID) | ORAL | Status: DC | PRN

## 2011-03-22 NOTE — Patient Instructions (Signed)
Anxiety and Panic Attacks Your caregiver has informed you that you are having an anxiety or panic attack. There may be many forms of this. Most of the time these attacks come suddenly and without warning. They come at any time of day, including periods of sleep, and at any time of life. They may be strong and unexplained. Although panic attacks are very scary, they are physically harmless. Sometimes the cause of your anxiety is not known. Anxiety is a protective mechanism of the body in its fight or flight mechanism. Most of these perceived danger situations are actually nonphysical situations (such as anxiety over losing a job). CAUSES  The causes of an anxiety or panic attack are many. Panic attacks may occur in otherwise healthy people given a certain set of circumstances. There may be a genetic cause for panic attacks. Some medications may also have anxiety as a side effect. SYMPTOMS  Some of the most common feelings are:  Intense terror.   Dizziness, feeling faint.   Hot and cold flashes.   Fear of going crazy.   Feelings that nothing is real.   Sweating.   Shaking.   Chest pain or a fast heartbeat (palpitations).   Smothering, choking sensations.   Feelings of impending doom and that death is near.   Tingling of extremities, this may be from over-breathing.   Altered reality (derealization).   Being detached from yourself (depersonalization).  Several symptoms can be present to make up anxiety or panic attacks. DIAGNOSIS  The evaluation by your caregiver will depend on the type of symptoms you are experiencing. The diagnosis of anxiety or panic attack is made when no physical illness can be determined to be a cause of the symptoms. TREATMENT  Treatment to prevent anxiety and panic attacks may include:  Avoidance of circumstances that cause anxiety.   Reassurance and relaxation.   Regular exercise.   Relaxation therapies, such as yoga.   Psychotherapy with a  psychiatrist or therapist.   Avoidance of caffeine, alcohol and illegal drugs.   Prescribed medication.  SEEK IMMEDIATE MEDICAL CARE IF:   You experience panic attack symptoms that are different than your usual symptoms.   You have any worsening or concerning symptoms.  Document Released: 04/30/2005 Document Revised: 01/10/2011 Document Reviewed: 09/01/2009 [facility] Patient Information 2012 [place], [place].Syncope You have had a fainting (syncopal) spell. A fainting episode is a sudden, short-lived loss of consciousness. It results in complete recovery. It occurs because there has been a temporary shortage of oxygen and/or sugar (glucose) to the brain. CAUSES   Blood pressure pills and other medications that may lower blood pressure below normal. Sudden changes in posture (sudden standing).   Over-medication. Take your medications as directed.   Standing too long. This can cause blood to pool in the legs.   Seizure disorders.   Low blood sugar (hypoglycemia) of diabetes. This more commonly causes coma.   Bearing down to go to the bathroom. This can cause your blood pressure to rise suddenly. Your body compensates by making the blood pressure too low when you stop bearing down.   Hardening of the arteries where the brain temporarily does not receive enough blood.   Irregular heart beat and circulatory problems.   Fear, emotional distress, injury, sight of blood, or illness.  Your caregiver will send you home if the syncope was from non-worrisome causes (benign). Depending on your age and health, you may stay to be monitored and observed. If you return home, have someone stay  with you if your caregiver feels that is desirable. It is very important to keep all follow-up referrals and appointments in order to properly manage this condition. This is a serious problem which can lead to serious illness and death if not carefully managed.  WARNING: Do not drive or operate machinery until  your caregiver feels that it is safe for you to do so. SEEK IMMEDIATE MEDICAL CARE IF:   You have another fainting episode or faint while lying or sitting down. DO NOT DRIVE YOURSELF. Call 911 if no other help is available.   You have chest pain, are feeling sick to your stomach (nausea), vomiting or abdominal pain.   You have an irregular heartbeat or one that is very fast (pulse over 120 beats per minute).   You have a loss of feeling in some part of your body or lose movement in your arms or legs.   You have difficulty with speech, confusion, severe weakness, or visual problems.   You become sweaty and/or feel light headed.  Make sure you are rechecked as instructed. Document Released: 04/30/2005 Document Revised: 01/10/2011 Document Reviewed: 12/19/2006 [facility] Patient Information 2012 [place], [place].

## 2011-03-22 NOTE — Progress Notes (Signed)
Chief Complaint:  Vaginal Pain, Bleeding post intercourse and Near Syncope   Melissa [name] is  35 y.o. G2P1011.  Patient's last menstrual period was 02/25/2011.Marland Kitchen  Her pregnancy status is negative.  She presents complaining of Vaginal Pain, Bleeding post intercourse and Near Syncope  Reports multiple syncopal and near syncopal events over the last 2-3 months. First happened with husband was trying to break-up an argument b/t 2 other men in neighborhood. Last event was yesterday during argument with husband. States with both the 1st and most recent event patient's heart started racing, then everything went dark. Pt states awoke to husbands slapping at cheeks to wake up.  Obstetrical/Gynecological History: OB History    Grav Para Term Preterm Abortions TAB SAB Ect Mult Living   2 1 1  0 1  1 0 0 1      Past Medical History: Past Medical History  Diagnosis Date  . Abnormal Pap smear 08/30/2008    LGSIL  . Infertility     Secondary PCOS    Past Surgical History: History reviewed. No pertinent past surgical history.  Family History: History reviewed. No pertinent family history.  Social History: History  Substance Use Topics  . Smoking status: Never Smoker   . Smokeless tobacco: Never Used  . Alcohol Use: No    Allergies: No Known Allergies   Review of Systems - Negative except what has been reviewed in the HPI  Physical Exam   Blood pressure 130/78, pulse 94, temperature 97.8 F (36.6 C), temperature source Oral, height 5\' 9"  (1.753 m), weight 251 lb 6.4 oz (114.034 kg), last menstrual period 02/25/2011.  General: General appearance - alert, well appearing, and in no distress, oriented to person, place, and time and overweight Mental status - alert, oriented to person, place, and time, normal mood, behavior, speech, dress, motor activity, and thought processes, affect appropriate to mood Heart:RRR, no murmurs or bruits, orthostatic VS NL Focused Gynecological Exam: VULVA:  Abrasion noted at 11 o'clock, peri-urethral, VAGINA: normal appearing vagina with normal color and discharge, no lesions, CERVIX: normal appearing cervix without discharge or lesions, UTERUS: Non tender, unable to appreciate size r/t pt habitus, ADNEXA:  Non tender, unable to appreciate size r/t pt habitus  Referrals: Cardiology  Labs: CBC, CMET   Assessment: Vaginal abrasion Probable Anxiety Syncope  Plan: Recommend additional lubricant with intimacy to decrease friction IH:KVQQV .5mg  po q 8 hours prn anxiety Discussed with patient syncopal events likely r/t to anxiety, however, need full wu to rule out other etiology. CBC, CMP, TSH today. Cardiology Referral Plan to start daily anti-anxiety med if improvement of s/s and NL cards WU, Pt agrees with plan.  Melissa [name] E. 03/22/2011,4:57 PM

## 2011-04-13 ENCOUNTER — Encounter: Payer: Self-pay | Admitting: Cardiovascular Disease

## 2011-04-13 ENCOUNTER — Ambulatory Visit (INDEPENDENT_AMBULATORY_CARE_PROVIDER_SITE_OTHER): Payer: Medicaid Other | Admitting: Cardiovascular Disease

## 2011-04-13 VITALS — BP 126/81 | HR 65 | Ht 66.0 in | Wt 254.4 lb

## 2011-04-13 DIAGNOSIS — R55 Syncope and collapse: Secondary | ICD-10-CM

## 2011-04-13 NOTE — Patient Instructions (Signed)
Your physician recommends that you schedule a follow-up appointment ; AS NEEDED BASIS,  Your physician has requested that you have an echocardiogram. Echocardiography is a painless test that uses sound waves to create images of your heart. It provides your doctor with information about the size and shape of your heart and how well your heart's chambers and valves are working. This procedure takes approximately one hour. There are no restrictions for this procedure.  Your physician has recommended that you wear an event monitor. Event monitors are medical devices that record the heart's electrical activity. Doctors most often [place] these monitors to diagnose arrhythmias. Arrhythmias are problems with the speed or rhythm of the heartbeat. The monitor is a small, portable device. You can wear one while you do your normal daily activities. This is usually used to diagnose what is causing palpitations/syncope (passing out).

## 2011-04-13 NOTE — Assessment & Plan Note (Signed)
Melissa [name] presents with several episodes of syncope. I doubt that there is any cardiac etiology. We will get an echocardiogram and place a monitor on her.  I should think that she has a sleep disorder. Her husband states that she doesn't snore. She's she just does not sleep well at night. She'll stay up all night long and sleep during the day. She's tired all the time. She has a chronic headache.  I've advised her to try to eat and sleep on regular basis. She is to get exercise on regular basis. I think that this will help her sleep pattern. We'll see her on an as-needed basis he'll see her sooner if the echocardiogram or event monitor reveal any significant problems.

## 2011-04-13 NOTE — Progress Notes (Signed)
    Melissa [name] Date of Birth  1976-04-08 [facility] [address]    [address] [place], [place]  [address] [phone]  Fax  [phone]  History of Present Illness:  35 yo with a hx of syncope.  She's had 3 episodes of syncope. One episode occurred when she received out from her husband. She was out for about 30 seconds. He had a temperature several times to get her to wake up. Was no postictal symptoms. No incontinence.    She had another episode of 60 that occurred when she was walking to the bathroom. She has occasional episodes of lightheadedness that proceed these episodes.  She does not get any regular exercise. She's tired all daylong. She wakes up with a headache and keeps a headache all along.  She does not snore according to her husband. She admits that she does not sleep well. She'll  Stay up at all night long.  Current Outpatient Prescriptions on File Prior to Visit  Medication Sig Dispense Refill  . cetirizine (ZYRTEC) 10 MG tablet Take 10 mg by mouth daily as needed.        . Prenatal Vit-Fe Psac Cmplx-FA (PRENATAL MULTIVITAMIN) 60-1 MG tablet Take 1 tablet by mouth daily.          No Known Allergies  Past Medical History  Diagnosis Date  . Abnormal Pap smear 08/30/2008    LGSIL  . Infertility     Secondary PCOS    No past surgical history on file.  History  Smoking status  . Never Smoker   Smokeless tobacco  . Never Used    History  Alcohol Use No    No family history on file.  Reviw of Systems:  Reviewed in the HPI.  All other systems are negative.  Physical Exam: BP 126/81  Pulse 65  Ht 5\' 6"  (1.676 m)  Wt 254 lb 6.4 oz (115.395 kg)  BMI 41.06 kg/m2  LMP 02/25/2011 The patient is alert and oriented x 3.  The mood and affect are normal.   Skin: warm and dry.  Color is normal.    HEENT:   Normocephalic/atraumatic. She is no JVD. Carotids are normal. Her neck is supple.  Lungs: Lung exam is clear   Heart: Regular rate  S1-S2.    Abdomen: Abdominal exam is moderately obese. Chest good bowel sounds. There is no hepatosplenomegaly.  Extremities:  No clubbing cyanosis or edema.  Neuro:  Neuro exam is nonfocal    ECG: Normal sinus rhythm with sinus arrhythmia. It is a normal EKG.  Assessment / Plan:

## 2011-04-19 ENCOUNTER — Encounter (INDEPENDENT_AMBULATORY_CARE_PROVIDER_SITE_OTHER): Payer: Medicaid Other

## 2011-04-19 ENCOUNTER — Ambulatory Visit (HOSPITAL_COMMUNITY): Payer: Medicaid Other | Attending: Internal Medicine | Admitting: Radiology

## 2011-04-19 DIAGNOSIS — I359 Nonrheumatic aortic valve disorder, unspecified: Secondary | ICD-10-CM | POA: Insufficient documentation

## 2011-04-19 DIAGNOSIS — R5381 Other malaise: Secondary | ICD-10-CM | POA: Insufficient documentation

## 2011-04-19 DIAGNOSIS — E669 Obesity, unspecified: Secondary | ICD-10-CM | POA: Insufficient documentation

## 2011-04-19 DIAGNOSIS — R55 Syncope and collapse: Secondary | ICD-10-CM

## 2011-04-19 DIAGNOSIS — I079 Rheumatic tricuspid valve disease, unspecified: Secondary | ICD-10-CM | POA: Insufficient documentation

## 2011-04-19 DIAGNOSIS — R5383 Other fatigue: Secondary | ICD-10-CM | POA: Insufficient documentation

## 2011-05-03 ENCOUNTER — Ambulatory Visit: Payer: Medicaid Other | Admitting: Family

## 2011-05-15 HISTORY — PX: ECTOPIC PREGNANCY SURGERY: SHX613

## 2011-06-07 ENCOUNTER — Ambulatory Visit: Payer: Medicaid Other | Admitting: Physician Assistant

## 2011-06-12 ENCOUNTER — Telehealth: Payer: Self-pay | Admitting: *Deleted

## 2011-06-12 NOTE — Telephone Encounter (Signed)
Message copied by Antony Odea on Tue Jun 12, 2011 11:57 AM ------      Message from: Pricilla Holm      Created: Fri Apr 13, 2011 12:11 PM      Regarding: monitor/echo        04-19-11  Echo/ event monitor                   Melissa [name]  - pt didn't get monitor put on by [facility].

## 2011-06-12 NOTE — Telephone Encounter (Signed)
Called with normal event monitor

## 2011-06-15 DIAGNOSIS — O009 Unspecified ectopic pregnancy without intrauterine pregnancy: Secondary | ICD-10-CM

## 2011-06-15 HISTORY — DX: Unspecified ectopic pregnancy without intrauterine pregnancy: O00.90

## 2011-06-15 HISTORY — PX: DILATION AND CURETTAGE OF UTERUS: SHX78

## 2011-06-27 ENCOUNTER — Ambulatory Visit: Payer: Medicaid Other | Admitting: Physician Assistant

## 2011-07-02 ENCOUNTER — Encounter: Payer: Self-pay | Admitting: Physician Assistant

## 2011-07-02 ENCOUNTER — Ambulatory Visit (INDEPENDENT_AMBULATORY_CARE_PROVIDER_SITE_OTHER): Payer: Medicaid Other | Admitting: Physician Assistant

## 2011-07-02 VITALS — BP 134/78 | HR 74 | Temp 99.1°F | Ht 65.0 in | Wt 252.3 lb

## 2011-07-02 DIAGNOSIS — N912 Amenorrhea, unspecified: Secondary | ICD-10-CM

## 2011-07-02 DIAGNOSIS — Z01812 Encounter for preprocedural laboratory examination: Secondary | ICD-10-CM

## 2011-07-02 DIAGNOSIS — Z3201 Encounter for pregnancy test, result positive: Secondary | ICD-10-CM

## 2011-07-02 LAB — CBC
Platelets: 328 10*3/uL (ref 150–400)
RBC: 4.65 MIL/uL (ref 3.87–5.11)
WBC: 4.9 10*3/uL (ref 4.0–10.5)

## 2011-07-02 LAB — POCT PREGNANCY, URINE: Preg Test, Ur: POSITIVE — AB

## 2011-07-02 NOTE — Patient Instructions (Addendum)
________________________________________     To schedule your Maternity Eligibility Appointment, please call [phone].  When you arrive for your appointment you must bring the following items or information listed below.  Your appointment will be rescheduled if you do not have these items or are 15 minutes late. If currently receiving Medicaid, you MUST bring: 1. Medicaid Card 2. Social Security Card 3. Picture ID 4. Proof of Pregnancy 5. Verification of current address if the address on Medicaid card is incorrect "postmarked mail" If not receiving Medicaid, you MUST bring: 1. Social Security Card 2. Picture ID 3. Birth Certificate (if available) Passport or *Green Card 4. Proof of Pregnancy 5. Verification of current address "postmarked mail" for each income presented. 6. Verification of insurance coverage, if any 7. Check stubs from each employer for the previous month (if unable to present check stub  for each week, we will accept check stub for the first and last week ill the same month.) If you can't locate check stubs, you must bring a letter from the employer(s) and it must have the following information on letterhead, typed, in English: o name of company o company telephone number o how long been with the company, if less than one month o how much person earns per hour o how many hours per week work o the gross pay the person earned for the previous month If you are 34 years old or less, you do not have to bring proof of income unless you work or live with the father of the baby and at that time we will need proof of income from you and/or the father of the baby. Green Card recipients are eligible for Medicaid for Pregnant Women (MPW)   Pregnancy If you are planning on getting pregnant, it is a good idea to make a preconception appointment with your care- giver to discuss having a healthy lifestyle before getting pregnant. Such as, diet, weight, exercise, taking  prenatal vitamins especially folic acid (it helps prevent brain and spinal cord defects), avoiding alcohol, smoking and illegal drugs, medical problems (diabetes, convulsions), family history of genetic problems, working conditions and immunizations. It is better to have knowledge of these things and do something about them before getting pregnant. In your pregnancy, it is important to follow certain guidelines to have a healthy baby. It is very important to get good prenatal care and follow your caregiver's instructions. Prenatal care includes all the medical care you receive before your baby's birth. This helps to prevent problems during the pregnancy and childbirth. HOME CARE INSTRUCTIONS  Start your prenatal visits by the 12th week of pregnancy or before when possible. They are usually scheduled monthly at first. They are more often in the last 2 months before delivery. It is important that you keep your caregiver's appointments and follow your caregiver's instructions regarding medication use, exercise, and diet.  During pregnancy, you are providing food for you and your baby. Eat a regular, well-balanced diet. Choose foods such as meat, fish, milk and other dairy products, vegetables, fruits, whole-grain breads and cereals. Your caregiver will inform you of the ideal weight gain depending on your current height and weight. Drink lots of liquids. Try to drink 8 glasses of water a day.  Alcohol is associated with a number of birth defects including fetal alcohol syndrome. It is best to avoid alcohol completely. Smoking will cause low birth rate and prematurity. Use of alcohol and nicotine during your pregnancy also increases the chances that your child  will be chemically dependent later in their life and may contribute to SIDS (Sudden Infant Death Syndrome).  Do not use illegal drugs.  Only take prescription or over-the-counter medications that are recommended by your caregiver. Other medications can  cause genetic and physical problems in the baby.  Morning sickness can often be helped by keeping soda crackers at the bedside. Eat a couple before arising in the morning.  A sexual relationship may be continued until near the end of pregnancy if there are no other problems such as early (premature) leaking of amniotic fluid from the membranes, vaginal bleeding, painful intercourse or belly (abdominal) pain.  Exercise regularly. Check with your caregiver if you are unsure of the safety of some of your exercises.  Do not use hot tubs, steam rooms or saunas. These increase the risk of fainting or passing out and hurting yourself and the baby. Swimming is OK for exercise. Get plenty of rest, including afternoon naps when possible especially in the third trimester.  Avoid toxic odors and chemicals.  Do not wear high heels. They may cause you to lose your balance and fall.  Do not lift over 5 pounds. If you do lift anything, lift with your legs and thighs, not your back.  Avoid long trips, especially in the third trimester.  If you have to travel out of the city or state, take a copy of your medical records with you.  SEEK IMMEDIATE MEDICAL CARE IF:  You develop an unexplained oral temperature above 102 F (38.9 C), or as your caregiver suggests.  You have leaking of fluid from the vagina. If leaking membranes are suspected, take your temperature and inform your caregiver of this when you call.  There is vaginal spotting or bleeding. Notify your caregiver of the amount and how many pads are used.  You continue to feel sick to your stomach (nauseous) and have no relief from remedies suggested, or you throw up (vomit) blood or coffee ground like materials.  You develop upper abdominal pain.  You have round ligament discomfort in the lower abdominal area. This still must be evaluated by your caregiver.  You feel contractions of the uterus.  You do not feel the baby move, or there is less movement than  before.  You have painful urination.  You have abnormal vaginal discharge.  You have persistent diarrhea.  You get a severe headache.  You have problems with your vision.  You develop muscle weakness.  You feel dizzy and faint.  You develop shortness of breath.  You develop chest pain.  You have back pain that travels down to your leg and feet.  You feel irregular or a very fast heartbeat.  You develop excessive weight gain in a short period of time (5 pounds in 3 to 5 days).  You are involved with a domestic violence situation.  Document Released: 04/30/2005 Document Revised: 01/10/2011 Document Reviewed: 10/22/2008 [facility] Patient Information 2012 [place], [place].

## 2011-07-03 LAB — RH TYPE: Rh Type: POSITIVE

## 2011-07-03 LAB — ABO

## 2011-07-03 LAB — HCG, QUANTITATIVE, PREGNANCY: hCG, Beta Chain, Quant, S: 3276.5 m[IU]/mL

## 2011-07-04 ENCOUNTER — Encounter (HOSPITAL_COMMUNITY): Payer: Self-pay | Admitting: *Deleted

## 2011-07-04 ENCOUNTER — Inpatient Hospital Stay (HOSPITAL_COMMUNITY): Payer: Medicaid Other

## 2011-07-04 ENCOUNTER — Inpatient Hospital Stay (HOSPITAL_COMMUNITY)
Admission: AD | Admit: 2011-07-04 | Discharge: 2011-07-05 | Disposition: A | Discharge status: Home / Self Care | Payer: Medicaid Other | Source: Ambulatory Visit | Attending: Obstetrics and Gynecology | Admitting: Obstetrics and Gynecology

## 2011-07-04 DIAGNOSIS — R109 Unspecified abdominal pain: Secondary | ICD-10-CM

## 2011-07-04 DIAGNOSIS — E282 Polycystic ovarian syndrome: Secondary | ICD-10-CM | POA: Insufficient documentation

## 2011-07-04 DIAGNOSIS — O99891 Other specified diseases and conditions complicating pregnancy: Secondary | ICD-10-CM | POA: Insufficient documentation

## 2011-07-04 DIAGNOSIS — R1031 Right lower quadrant pain: Secondary | ICD-10-CM | POA: Insufficient documentation

## 2011-07-04 DIAGNOSIS — O26899 Other specified pregnancy related conditions, unspecified trimester: Secondary | ICD-10-CM

## 2011-07-04 HISTORY — DX: Polycystic ovarian syndrome: E28.2

## 2011-07-04 LAB — DIFFERENTIAL
Blasts: 0 %
Metamyelocytes Relative: 0 %
Monocytes Absolute: 0.6 10*3/uL (ref 0.1–1.0)
Monocytes Relative: 9 % (ref 3–12)
Neutrophils Relative %: 41 % — ABNORMAL LOW (ref 43–77)
nRBC: 0 /100 WBC

## 2011-07-04 LAB — CBC
MCV: 86.6 fL (ref 78.0–100.0)
Platelets: 286 10*3/uL (ref 150–400)
RDW: 14.7 % (ref 11.5–15.5)
WBC: 6.7 10*3/uL (ref 4.0–10.5)

## 2011-07-04 MED ORDER — HYDROMORPHONE HCL PF 1 MG/ML IJ SOLN: 1.0000 mg | Freq: Once | INTRAMUSCULAR | Status: DC

## 2011-07-04 MED ORDER — HYDROMORPHONE HCL PF 1 MG/ML IJ SOLN
1.0000 mg | Freq: Once | INTRAMUSCULAR | Status: AC
  Administered 2011-07-04: 1 mg via INTRAMUSCULAR
  Filled 2011-07-04: qty 1

## 2011-07-04 MED ORDER — LACTATED RINGERS IV SOLN: INTRAVENOUS | Status: DC

## 2011-07-04 NOTE — Progress Notes (Signed)
Patient complains of lower right abdominal pain that shoots down her right leg. Presently the pain is a 9/10 and is constant. The pain started around 5pm this evening. No vaginal bleeding or discharge

## 2011-07-04 NOTE — ED Provider Notes (Signed)
History     Chief Complaint  Patient presents with  . Abdominal Pain   HPI Melissa [name] is 36 y.o. G2P1011 Unknown weeks presenting with sudden onset of right lower quadrant  pain at 5pm today.  Hx of PCOS and had a +UPT and BHCG 3276.5 in [facility] 2/18.  Patient is very concerned.  Tearful.  Last bowel movement today, normal.  Last ate at 8pm.  Normal appetite.     Past Medical History  Diagnosis Date  . Abnormal Pap smear 08/30/2008    LGSIL  . Infertility     Secondary PCOS  . PCOS (polycystic ovarian syndrome)     Past Surgical History  Procedure Date  . Wisdom tooth extraction   . Right ear surgery     Family History  Problem Relation Age of Onset  . Anesthesia problems Neg Hx     History  Substance Use Topics  . Smoking status: Never Smoker   . Smokeless tobacco: Never Used  . Alcohol Use: No    Allergies:  Allergies  Allergen Reactions  . Spinach Itching    Prescriptions prior to admission  Medication Sig Dispense Refill  . cetirizine (ZYRTEC) 10 MG tablet Take 10 mg by mouth daily as needed. For allergies      . Prenatal Vit-Fe Psac Cmplx-FA (PRENATAL MULTIVITAMIN) 60-1 MG tablet Take 1 tablet by mouth daily.          Review of Systems  Constitutional:       Good appetite  Gastrointestinal: Positive for abdominal pain (lower right sided pain). Negative for nausea and vomiting.   Physical Exam   Blood pressure 151/83, pulse 74, temperature 98.4 F (36.9 C), temperature source Oral, resp. rate 20, last menstrual period 06/10/2011, SpO2 100.00%.  Physical Exam  Constitutional: She is oriented to person, place, and time. She appears well-developed and well-nourished.       Uncomfortable, teary  Cardiovascular: Normal rate.   Respiratory: Effort normal.  GI: Soft. She exhibits no distension and no mass. There is no tenderness. There is no rebound and no guarding.  Genitourinary: Uterus is not enlarged and not tender. Cervix exhibits no motion  tenderness, no discharge and no friability. Right adnexum displays no mass, no tenderness and no fullness. Left adnexum displays no mass, no tenderness and no fullness. There is bleeding (a very small amount of blood mixed in mucus on the cervix) around the vagina.  Neurological: She is alert and oriented to person, place, and time.  Skin: Skin is warm and dry.   BLOOD TYPE PREVIOUS RECORD  O Positive.     *RADIOLOGY REPORT*  Clinical Data: Right lower quadrant abdominal pain.  OBSTETRIC <14 WK [place] AND TRANSVAGINAL OB US  Technique: Both transabdominal and transvaginal ultrasound  examinations were performed for complete evaluation of the  gestation as well as the maternal uterus, adnexal regions, and  pelvic cul-de-sac. Transvaginal technique was performed to assess  early pregnancy.  Comparison: Prior pelvic ultrasound performed 03/24/2010  Intrauterine gestational sac: None seen.  Yolk sac: N/A  Embryo: N/A  Cardiac Activity: N/A  Maternal uterus/adnexae:  The uterus demonstrates a 2.3 x 2.3 x 2.1 cm right anterolateral  subserosal fibroid, as well as two tiny posterior fibroids. The  uterus is otherwise unremarkable in appearance. The endometrial  echo complex measures approximately 1.3 cm in thickness.  The ovaries are within normal limits. The right ovary measures 4.6  x 4.1 x 3.5 cm, while the left ovary  measures 2.5 x 1.3 x 1.7 cm.  A right adnexal anechoic cyst, measuring 3.7 cm in size, is likely  physiologic in nature. There is no definite evidence for ectopic  pregnancy. There is no evidence for ovarian torsion.  The adnexa are difficult to fully characterize due to the patient's  habitus and overlying bowel gas.  Trace free fluid is noted within the pelvic cul-de-sac.  IMPRESSION:  1. No intrauterine gestational sac seen. Given the quantitative  beta HCG level of 3276 on 07/02/2011, this raises suspicion for a  spontaneous abortion. Would follow up quantitative beta  HCG level;  if it continues to trend upward, this would raise concern for a non-  visualized ectopic pregnancy, though follow-up pelvic ultrasound  could be considered in 7-10 days for further evaluation, as deemed  clinically appropriate.  2. Fibroid uterus noted.  3. 3.7 cm right adnexal anechoic cyst is likely physiologic in  nature. This could be further assessed in two to three cycles'  time, to ensure resolution.  Original Report Authenticated By: Tonia Ghent, M.D.   Results for orders placed during the hospital encounter of 07/04/11 (from the past 24 hour(s))  CBC     Status: Normal   Collection Time   07/04/11 11:20 PM      Component Value Range   WBC 6.7  4.0 - 10.5 (K/uL)   RBC 4.34  3.87 - 5.11 (MIL/uL)   Hemoglobin 12.4  12.0 - 15.0 (g/dL)   HCT 40.9  81.1 - 91.4 (%)   MCV 86.6  78.0 - 100.0 (fL)   MCH 28.6  26.0 - 34.0 (pg)   MCHC 33.0  30.0 - 36.0 (g/dL)   RDW 78.2  95.6 - 21.3 (%)   Platelets 286  150 - 400 (K/uL)  DIFFERENTIAL     Status: Abnormal   Collection Time   07/04/11 11:20 PM      Component Value Range   Neutrophils Relative 41 (*) 43 - 77 (%)   Lymphocytes Relative 43  12 - 46 (%)   Monocytes Relative 9  3 - 12 (%)   Eosinophils Relative 4  0 - 5 (%)   Basophils Relative 0  0 - 1 (%)   Band Neutrophils 3  0 - 10 (%)   Metamyelocytes Relative 0     Myelocytes 0     Promyelocytes Absolute 0     Blasts 0     nRBC 0  0 (/100 WBC)   Neutro Abs 2.9  1.7 - 7.7 (K/uL)   Lymphs Abs 2.9  0.7 - 4.0 (K/uL)   Monocytes Absolute 0.6  0.1 - 1.0 (K/uL)   Eosinophils Absolute 0.3  0.0 - 0.7 (K/uL)   Basophils Absolute 0.0  0.0 - 0.1 (K/uL)  ABO/RH     Status: Normal   Collection Time   07/04/11 11:20 PM      Component Value Range   ABO/RH(D) O POS    HCG, QUANTITATIVE, PREGNANCY     Status: Abnormal   Collection Time   07/05/11 12:50 AM      Component Value Range   hCG, Beta Chain, Quant, S 3482 (*) <5 (mIU/mL)  WET PREP, GENITAL     Status: Normal    Collection Time   07/05/11  1:08 AM      Component Value Range   Yeast Wet Prep HPF POC NONE SEEN  NONE SEEN    Trich, Wet Prep NONE SEEN  NONE SEEN  Clue Cells Wet Prep HPF POC NONE SEEN  NONE SEEN    WBC, Wet Prep HPF POC NONE SEEN  NONE SEEN     MAU Course  Procedures  Rn reports difficulty getting IV started.  Will change Dilaudid to IM for now to get pain relieved.   Per Ultrasound tech-IUP not seen.  Will order BHCG.  MDM Discussed patient with Dr. Emelda Fear.  Will start IV and Give Dilaudid 1mg  IV for pain control. Discussed difficulty of IV insertion with Dr. Emelda Fear.  Will hold off inserting at this time until [facility] and Ultrasound report back.   Exam was done after Dilaudid and back from Ultrasound.    1:20  Ultrasound report and individual views reviewed by Dr. Emelda Fear.  Order given to follow BHCGs--Saturday am.  Dr. Emelda Fear in to see patient. Reviewed labs and [place] with the patient and concern for ectopic pregnancy. Return in 48 hrs for repeat BHCG>  Instructed patient to return before that time if pain or bleeding worsens.  Dr. Emelda Fear came out of the room and stated it is the patient's wish to follow blood levels.    Assessment and Plan  A:  Abdominal pain in early pregnancy     Probably ectopic pregnancy given the slight rise of BHCG in 48 hrs       P:  Return Saturday Jul 07, 2011 for repeat lab work or sooner if pain or bleeding increases.   Eupha Lobb,EVE M 07/04/2011, 11:15 PM   Matt Holmes, NP 07/05/11 0153

## 2011-07-05 ENCOUNTER — Ambulatory Visit: Payer: Self-pay | Admitting: Physician Assistant

## 2011-07-05 ENCOUNTER — Encounter (HOSPITAL_COMMUNITY): Payer: Self-pay

## 2011-07-05 ENCOUNTER — Encounter (HOSPITAL_COMMUNITY): Payer: Self-pay | Admitting: Anesthesiology

## 2011-07-05 ENCOUNTER — Encounter (HOSPITAL_COMMUNITY)
Admission: AD | Disposition: A | Discharge status: Home / Self Care | Payer: Self-pay | Source: Ambulatory Visit | Attending: Obstetrics & Gynecology

## 2011-07-05 ENCOUNTER — Inpatient Hospital Stay (HOSPITAL_COMMUNITY): Payer: Medicaid Other | Admitting: Anesthesiology

## 2011-07-05 ENCOUNTER — Inpatient Hospital Stay (HOSPITAL_COMMUNITY): Payer: Medicaid Other

## 2011-07-05 ENCOUNTER — Ambulatory Visit (HOSPITAL_COMMUNITY)
Admission: AD | Admit: 2011-07-05 | Discharge: 2011-07-05 | Disposition: A | Discharge status: Home / Self Care | Payer: Medicaid Other | Source: Ambulatory Visit | Attending: Obstetrics & Gynecology | Admitting: Obstetrics & Gynecology

## 2011-07-05 ENCOUNTER — Other Ambulatory Visit: Payer: Self-pay | Admitting: Obstetrics & Gynecology

## 2011-07-05 DIAGNOSIS — O00109 Unspecified tubal pregnancy without intrauterine pregnancy: Principal | ICD-10-CM | POA: Insufficient documentation

## 2011-07-05 DIAGNOSIS — O009 Unspecified ectopic pregnancy without intrauterine pregnancy: Secondary | ICD-10-CM

## 2011-07-05 DIAGNOSIS — R1031 Right lower quadrant pain: Secondary | ICD-10-CM

## 2011-07-05 HISTORY — PX: LAPAROSCOPY: SHX197

## 2011-07-05 HISTORY — DX: Chlamydial infection, unspecified: A74.9

## 2011-07-05 LAB — CBC
Hemoglobin: 12.4 g/dL (ref 12.0–15.0)
MCH: 28.1 pg (ref 26.0–34.0)
MCHC: 32.6 g/dL (ref 30.0–36.0)
MCV: 86 fL (ref 78.0–100.0)

## 2011-07-05 LAB — WET PREP, GENITAL
Clue Cells Wet Prep HPF POC: NONE SEEN
Trich, Wet Prep: NONE SEEN

## 2011-07-05 LAB — HCG, QUANTITATIVE, PREGNANCY: hCG, Beta Chain, Quant, S: 3063 m[IU]/mL — ABNORMAL HIGH (ref <5)

## 2011-07-05 LAB — CREATININE, SERUM

## 2011-07-05 LAB — AST: AST: <5 U/L (ref 0–37)

## 2011-07-05 SURGERY — SALPINGECTOMY, UNILATERAL, LAPAROSCOPIC
Anesthesia: General | Site: Abdomen | Wound class: Clean Contaminated

## 2011-07-05 MED ORDER — LACTATED RINGERS IV SOLN
INTRAVENOUS | Status: DC | PRN
  Administered 2011-07-05 (×2): via INTRAVENOUS

## 2011-07-05 MED ORDER — FAMOTIDINE IN NACL 20-0.9 MG/50ML-% IV SOLN
20.0000 mg | Freq: Once | INTRAVENOUS | Status: AC
  Administered 2011-07-05: 20 mg via INTRAVENOUS

## 2011-07-05 MED ORDER — OXYCODONE-ACETAMINOPHEN 5-325 MG PO TABS: 1.0000 | ORAL_TABLET | Freq: Four times a day (QID) | ORAL | Status: AC | PRN

## 2011-07-05 MED ORDER — LACTATED RINGERS IR SOLN
Status: DC | PRN
  Administered 2011-07-05: 300 mL

## 2011-07-05 MED ORDER — NEOSTIGMINE METHYLSULFATE 1 MG/ML IJ SOLN
INTRAMUSCULAR | Status: DC | PRN
  Administered 2011-07-05: 4 mg via INTRAVENOUS

## 2011-07-05 MED ORDER — ONDANSETRON HCL 4 MG/2ML IJ SOLN
INTRAMUSCULAR | Status: DC | PRN
  Administered 2011-07-05: 4 mg via INTRAVENOUS

## 2011-07-05 MED ORDER — SUCCINYLCHOLINE CHLORIDE 20 MG/ML IJ SOLN
INTRAMUSCULAR | Status: DC | PRN
  Administered 2011-07-05: 140 mg via INTRAVENOUS

## 2011-07-05 MED ORDER — KETOROLAC TROMETHAMINE 30 MG/ML IJ SOLN
INTRAMUSCULAR | Status: DC | PRN
  Administered 2011-07-05: 30 mg via INTRAVENOUS

## 2011-07-05 MED ORDER — PROMETHAZINE HCL 25 MG/ML IJ SOLN: 6.2500 mg | INTRAMUSCULAR | Status: DC | PRN

## 2011-07-05 MED ORDER — CITRIC ACID-SODIUM CITRATE 334-500 MG/5ML PO SOLN
ORAL | Status: AC
  Administered 2011-07-05: 30 mL via ORAL
  Filled 2011-07-05: qty 15

## 2011-07-05 MED ORDER — KETOROLAC TROMETHAMINE 30 MG/ML IJ SOLN: 15.0000 mg | Freq: Once | INTRAMUSCULAR | Status: DC | PRN

## 2011-07-05 MED ORDER — IBUPROFEN 600 MG PO TABS
600.0000 mg | ORAL_TABLET | Freq: Four times a day (QID) | ORAL | Status: AC | PRN
Start: 2011-07-05 — End: 2011-07-15

## 2011-07-05 MED ORDER — ROCURONIUM BROMIDE 100 MG/10ML IV SOLN
INTRAVENOUS | Status: DC | PRN
  Administered 2011-07-05: 20 mg via INTRAVENOUS
  Administered 2011-07-05: 10 mg via INTRAVENOUS

## 2011-07-05 MED ORDER — FENTANYL CITRATE 0.05 MG/ML IJ SOLN: 25.0000 ug | INTRAMUSCULAR | Status: DC | PRN

## 2011-07-05 MED ORDER — LIDOCAINE HCL (CARDIAC) 20 MG/ML IV SOLN
INTRAVENOUS | Status: DC | PRN
  Administered 2011-07-05: 80 mg via INTRAVENOUS

## 2011-07-05 MED ORDER — HYDROMORPHONE HCL PF 1 MG/ML IJ SOLN
1.0000 mg | Freq: Once | INTRAMUSCULAR | Status: AC
  Administered 2011-07-05: 1 mg via INTRAVENOUS
  Filled 2011-07-05: qty 1

## 2011-07-05 MED ORDER — KETOROLAC TROMETHAMINE 30 MG/ML IJ SOLN
INTRAMUSCULAR | Status: AC
  Filled 2011-07-05: qty 1

## 2011-07-05 MED ORDER — CITRIC ACID-SODIUM CITRATE 334-500 MG/5ML PO SOLN
30.0000 mL | Freq: Once | ORAL | Status: AC
  Administered 2011-07-05: 30 mL via ORAL

## 2011-07-05 MED ORDER — PROPOFOL 10 MG/ML IV EMUL
INTRAVENOUS | Status: DC | PRN
  Administered 2011-07-05: 200 mg via INTRAVENOUS

## 2011-07-05 MED ORDER — ACETAMINOPHEN 325 MG PO TABS: 325.0000 mg | ORAL_TABLET | ORAL | Status: DC | PRN

## 2011-07-05 MED ORDER — DEXAMETHASONE SODIUM PHOSPHATE 10 MG/ML IJ SOLN
INTRAMUSCULAR | Status: DC | PRN
Start: 2011-07-05 — End: 2011-07-05
  Administered 2011-07-05: 10 mg via INTRAVENOUS

## 2011-07-05 MED ORDER — FENTANYL CITRATE 0.05 MG/ML IJ SOLN
INTRAMUSCULAR | Status: DC | PRN
  Administered 2011-07-05: 100 ug via INTRAVENOUS
  Administered 2011-07-05: 150 ug via INTRAVENOUS

## 2011-07-05 MED ORDER — GLYCOPYRROLATE 0.2 MG/ML IJ SOLN
INTRAMUSCULAR | Status: DC | PRN
  Administered 2011-07-05: .8 mg via INTRAVENOUS

## 2011-07-05 MED ORDER — SUCCINYLCHOLINE CHLORIDE 20 MG/ML IJ SOLN
INTRAMUSCULAR | Status: AC
  Filled 2011-07-05: qty 10

## 2011-07-05 MED ORDER — LACTATED RINGERS IV SOLN
INTRAVENOUS | Status: DC
  Administered 2011-07-05: 16:00:00 via INTRAVENOUS

## 2011-07-05 MED ORDER — BUPIVACAINE HCL (PF) 0.25 % IJ SOLN
INTRAMUSCULAR | Status: DC | PRN
  Administered 2011-07-05: 30 mL

## 2011-07-05 MED ORDER — MIDAZOLAM HCL 5 MG/5ML IJ SOLN
INTRAMUSCULAR | Status: DC | PRN
  Administered 2011-07-05: 2 mg via INTRAVENOUS

## 2011-07-05 MED ORDER — ONDANSETRON HCL 4 MG/2ML IJ SOLN
4.0000 mg | Freq: Once | INTRAMUSCULAR | Status: AC
  Administered 2011-07-05: 4 mg via INTRAVENOUS
  Filled 2011-07-05: qty 2

## 2011-07-05 MED ORDER — DOCUSATE SODIUM 100 MG PO CAPS: 100.0000 mg | ORAL_CAPSULE | Freq: Two times a day (BID) | ORAL | Status: AC | PRN

## 2011-07-05 MED ORDER — FAMOTIDINE IN NACL 20-0.9 MG/50ML-% IV SOLN
INTRAVENOUS | Status: AC
  Administered 2011-07-05: 20 mg via INTRAVENOUS
  Filled 2011-07-05: qty 50

## 2011-07-05 MED ORDER — ONDANSETRON 4 MG PO TBDP
4.0000 mg | ORAL_TABLET | Freq: Once | ORAL | Status: AC
  Administered 2011-07-05: 4 mg via ORAL

## 2011-07-05 MED ORDER — ROCURONIUM BROMIDE 50 MG/5ML IV SOLN
INTRAVENOUS | Status: AC
  Filled 2011-07-05: qty 1

## 2011-07-05 SURGICAL SUPPLY — 29 items
BAG SPEC RTRVL LRG 6X4 10 (ENDOMECHANICALS) ×1
CABLE HIGH FREQUENCY MONO STRZ (ELECTRODE) IMPLANT
CHLORAPREP W/TINT 26ML (MISCELLANEOUS) ×3 IMPLANT
CLOTH BEACON ORANGE TIMEOUT ST (SAFETY) ×3 IMPLANT
DRSG COVADERM PLUS 2X2 (GAUZE/BANDAGES/DRESSINGS) ×3 IMPLANT
GLOVE BIO SURGEON STRL SZ7 (GLOVE) ×3 IMPLANT
GLOVE BIOGEL PI IND STRL 7.0 (GLOVE) ×4 IMPLANT
GLOVE BIOGEL PI INDICATOR 7.0 (GLOVE) ×2
GOWN PREVENTION PLUS LG XLONG (DISPOSABLE) ×6 IMPLANT
NEEDLE GYRUS 33CM (NEEDLE) ×3 IMPLANT
NEEDLE INSUFFLATION 14GA 120MM (NEEDLE) ×3 IMPLANT
NS IRRIG 1000ML POUR BTL (IV SOLUTION) ×3 IMPLANT
PACK LAPAROSCOPY BASIN (CUSTOM PROCEDURE TRAY) ×3 IMPLANT
POUCH SPECIMEN RETRIEVAL 10MM (ENDOMECHANICALS) ×3 IMPLANT
PROTECTOR NERVE ULNAR (MISCELLANEOUS) ×3 IMPLANT
SEALER TISSUE G2 CVD JAW 35 (ENDOMECHANICALS) IMPLANT
SEALER TISSUE G2 CVD JAW 45CM (ENDOMECHANICALS) IMPLANT
SET IRRIG TUBING LAPAROSCOPIC (IRRIGATION / IRRIGATOR) ×3 IMPLANT
SUT VIC AB 3-0 X1 27 (SUTURE) IMPLANT
SUT VICRYL 0 UR6 27IN ABS (SUTURE) ×6 IMPLANT
SUT VICRYL 4-0 PS2 18IN ABS (SUTURE) ×3 IMPLANT
TOWEL OR 17X24 6PK STRL BLUE (TOWEL DISPOSABLE) ×6 IMPLANT
TRAY FOLEY CATH 14FR (SET/KITS/TRAYS/PACK) ×3 IMPLANT
TROCAR 12M 150ML BLUNT (TROCAR) ×3 IMPLANT
TROCAR 5M 150ML BLDLS (TROCAR) ×6 IMPLANT
TROCAR Z-THREAD BLADED 11X100M (TROCAR) ×6 IMPLANT
TROCAR Z-THREAD BLADED 5X100MM (TROCAR) ×6 IMPLANT
WARMER LAPAROSCOPE (MISCELLANEOUS) ×3 IMPLANT
WATER STERILE IRR 1000ML POUR (IV SOLUTION) ×3 IMPLANT

## 2011-07-05 NOTE — Progress Notes (Signed)
Pt states was here yesterday for rlq pain, given IM injection for pain. Still ahving pain, worsening, rates 6/10.

## 2011-07-05 NOTE — ED Provider Notes (Signed)
History     CSN: [id number]  Arrival date & time 07/05/11  1335   None     Chief Complaint  Patient presents with  . Abdominal Pain    HPI Melissa [name] is a 36 y.o. female who presents to MAU for abdominal pain in pregnancy. She was evaluated her last night and had a Bhcg of 3,482 and ultrasound that showed no IUP. Dr. Emelda Fear discussed with the patient possibility of ectopic pregnancy. The patient was to return in 2 days for follow up Bhcg; however, the pain increased today so she returned. She describes the pain as sharp and radiating from the right lower abdomen to her back and hip. She rates her pain a 7/10. The history was provided by the patient and her previous medical record.  Past Medical History  Diagnosis Date  . Abnormal Pap smear 08/30/2008    LGSIL  . Infertility     Secondary PCOS  . PCOS (polycystic ovarian syndrome)   . Chlamydia     Past Surgical History  Procedure Date  . Wisdom tooth extraction   . Right ear surgery     Family History  Problem Relation Age of Onset  . Anesthesia problems Neg Hx     History  Substance Use Topics  . Smoking status: Never Smoker   . Smokeless tobacco: Never Used  . Alcohol Use: No    OB History    Grav Para Term Preterm Abortions TAB SAB Ect Mult Living   3 1 1  0 1  1 0 0 1      Review of Systems  Constitutional: Negative for fever and chills.  HENT: Negative.   Cardiovascular: Negative.   Gastrointestinal: Positive for nausea, vomiting and abdominal pain.  Genitourinary: Positive for pelvic pain. Negative for dysuria, frequency, vaginal bleeding and vaginal discharge.  Musculoskeletal: Positive for back pain.  Psychiatric/Behavioral: Negative for confusion and agitation.    Allergies  Spinach  Home Medications  No current outpatient prescriptions on file.  LMP 06/10/2011  Physical Exam  Nursing note and vitals reviewed. Constitutional: She is oriented to person, place, and time. She appears  well-developed and well-nourished.  HENT:  Head: Normocephalic.  Eyes: EOM are normal.  Neck: Neck supple.  Pulmonary/Chest: Effort normal.  Abdominal: Soft. There is tenderness in the right lower quadrant. There is guarding. There is no rigidity and no rebound.  Musculoskeletal: Normal range of motion.  Neurological: She is alert and oriented to person, place, and time. No cranial nerve deficit.  Skin: Skin is warm and dry.  Psychiatric: She has a normal mood and affect. Her behavior is normal. Judgment and thought content normal.    ED Course  Procedures US Ob Comp Less 14 Wks  07/05/2011  *RADIOLOGY REPORT*  Clinical Data: Right lower quadrant abdominal pain.  OBSTETRIC <14 WK [place] AND TRANSVAGINAL OB US  Technique:  Both transabdominal and transvaginal ultrasound examinations were performed for complete evaluation of the gestation as well as the maternal uterus, adnexal regions, and pelvic cul-de-sac.  Transvaginal technique was performed to assess early pregnancy.  Comparison:  Prior pelvic ultrasound performed 03/24/2010  Intrauterine gestational sac:  None seen. Yolk sac: N/A Embryo: N/A Cardiac Activity: N/A  Maternal uterus/adnexae: The uterus demonstrates a 2.3 x 2.3 x 2.1 cm right anterolateral subserosal fibroid, as well as two tiny posterior fibroids.  The uterus is otherwise unremarkable in appearance.  The endometrial echo complex measures approximately 1.3 cm in thickness.  The ovaries are  within normal limits.  The right ovary measures 4.6 x 4.1 x 3.5 cm, while the left ovary measures 2.5 x 1.3 x 1.7 cm. A right adnexal anechoic cyst, measuring 3.7 cm in size, is likely physiologic in nature.  There is no definite evidence for ectopic pregnancy.  There is no evidence for ovarian torsion.  The adnexa are difficult to fully characterize due to the patient's habitus and overlying bowel gas.  Trace free fluid is noted within the pelvic cul-de-sac.  IMPRESSION:  1.  No intrauterine  gestational sac seen.  Given the quantitative beta HCG level of 3276 on 07/02/2011, this raises suspicion for a spontaneous abortion.  Would follow up quantitative beta HCG level; if it continues to trend upward, this would raise concern for a non- visualized ectopic pregnancy, though follow-up pelvic ultrasound could be considered in 7-10 days for further evaluation, as deemed clinically appropriate. 2.  Fibroid uterus noted. 3.  3.7 cm right adnexal anechoic cyst is likely physiologic in nature.  This could be further assessed in two to three cycles' time, to ensure resolution.  Original Report Authenticated By: Tonia Ghent, M.D.   US Ob Transvaginal  07/05/2011  *RADIOLOGY REPORT*  Clinical Data: Right lower quadrant abdominal pain.  OBSTETRIC <14 WK [place] AND TRANSVAGINAL OB US  Technique:  Both transabdominal and transvaginal ultrasound examinations were performed for complete evaluation of the gestation as well as the maternal uterus, adnexal regions, and pelvic cul-de-sac.  Transvaginal technique was performed to assess early pregnancy.  Comparison:  Prior pelvic ultrasound performed 03/24/2010  Intrauterine gestational sac:  None seen. Yolk sac: N/A Embryo: N/A Cardiac Activity: N/A  Maternal uterus/adnexae: The uterus demonstrates a 2.3 x 2.3 x 2.1 cm right anterolateral subserosal fibroid, as well as two tiny posterior fibroids.  The uterus is otherwise unremarkable in appearance.  The endometrial echo complex measures approximately 1.3 cm in thickness.  The ovaries are within normal limits.  The right ovary measures 4.6 x 4.1 x 3.5 cm, while the left ovary measures 2.5 x 1.3 x 1.7 cm. A right adnexal anechoic cyst, measuring 3.7 cm in size, is likely physiologic in nature.  There is no definite evidence for ectopic pregnancy.  There is no evidence for ovarian torsion.  The adnexa are difficult to fully characterize due to the patient's habitus and overlying bowel gas.  Trace free fluid is noted within  the pelvic cul-de-sac.  IMPRESSION:  1.  No intrauterine gestational sac seen.  Given the quantitative beta HCG level of 3276 on 07/02/2011, this raises suspicion for a spontaneous abortion.  Would follow up quantitative beta HCG level; if it continues to trend upward, this would raise concern for a non- visualized ectopic pregnancy, though follow-up pelvic ultrasound could be considered in 7-10 days for further evaluation, as deemed clinically appropriate. 2.  Fibroid uterus noted. 3.  3.7 cm right adnexal anechoic cyst is likely physiologic in nature.  This could be further assessed in two to three cycles' time, to ensure resolution.  Original Report Authenticated By: Tonia Ghent, M.D.   Results for orders placed during the hospital encounter of 07/05/11 (from the past 24 hour(s))  CBC     Status: Normal   Collection Time   07/05/11  2:46 PM      Component Value Range   WBC 7.4  4.0 - 10.5 (K/uL)   RBC 4.42  3.87 - 5.11 (MIL/uL)   Hemoglobin 12.4  12.0 - 15.0 (g/dL)   HCT 78.2  95.6 -  46.0 (%)   MCV 86.0  78.0 - 100.0 (fL)   MCH 28.1  26.0 - 34.0 (pg)   MCHC 32.6  30.0 - 36.0 (g/dL)   RDW 16.1  09.6 - 04.5 (%)   Platelets 293  150 - 400 (K/uL)  HCG, QUANTITATIVE, PREGNANCY     Status: Abnormal   Collection Time   07/05/11  2:46 PM      Component Value Range   hCG, Beta Chain, Quant, S 3063 (*) <5 (mIU/mL)   IVLR @ 125 cc/hr Dilaudid 1 mg IV Zofran 4 mg IV  17:00 patient returned from ultrasound and pain is better after dilaudid 17:08 call from Dr. Kyung Rudd in Radiology with report of ectopic pregnancy right  Assessment: Increased pelvic pain   Right ectopic pregnancy   Uterine fibroid   Ovarian cyst  Plan:  Consult with Dr. Macon Large and he will discuss options with patient   Attending Addendum  36 y.o. G3P1011 with right ectopic pregnancy and severe pain on examination.   On exam, she had stable vital signs, stable labs. Patient was counseled regarding need for laparoscopic  removal of right ectopic pregnancy, possible salpingectomy.  Risks of surgery including bleeding which may require transfusion or reoperation, infection, injury to bowel or other surrounding organs, need for additional procedures including laparotomy were explained to patient and written informed consent was obtained.  Patient has been NPO for food since yesterday, but had a cup of water at 1 pm today, and she will remain NPO for procedure.  Anesthesia and OR aware. To OR when ready.  Jaynie Collins, M.D. 07/05/2011 7:01 PM

## 2011-07-05 NOTE — Anesthesia Preprocedure Evaluation (Signed)
Anesthesia Evaluation  Patient identified by MRN, date of birth, ID band Patient awake    Reviewed: Allergy & Precautions, H&P , Patient's Chart, lab work & pertinent test results, reviewed documented beta blocker date and time   History of Anesthesia Complications Negative for: history of anesthetic complications  Airway Mallampati: III TM Distance: >3 FB Neck ROM: full    Dental No notable dental hx.    Pulmonary neg pulmonary ROS,  clear to auscultation  Pulmonary exam normal       Cardiovascular Exercise Tolerance: Good neg cardio ROS regular Normal    Neuro/Psych Negative Neurological ROS  Negative Psych ROS   GI/Hepatic negative GI ROS, Neg liver ROS,   Endo/Other  Negative Endocrine ROSMorbid obesity  Renal/GU negative Renal ROS     Musculoskeletal   Abdominal   Peds  Hematology negative hematology ROS (+)   Anesthesia Other Findings   Reproductive/Obstetrics negative OB ROS                           Anesthesia Physical Anesthesia Plan  ASA: III  Anesthesia Plan: General ETT   Post-op Pain Management:    Induction:   Airway Management Planned:   Additional Equipment:   Intra-op Plan:   Post-operative Plan:   Informed Consent: I have reviewed the patients History and Physical, chart, labs and discussed the procedure including the risks, benefits and alternatives for the proposed anesthesia with the patient or authorized representative who has indicated his/her understanding and acceptance.   Dental Advisory Given  Plan Discussed with: CRNA and Surgeon  Anesthesia Plan Comments:         Anesthesia Quick Evaluation

## 2011-07-05 NOTE — Transfer of Care (Signed)
Immediate Anesthesia Transfer of Care Note  Patient: Melissa [name]  Procedure(s) Performed: Procedure(s) (LRB): LAPAROSCOPY OPERATIVE (N/A) LAPAROSCOPIC UNILATERAL SALPINGECTOMY (Left)  Patient Location: PACU  Anesthesia Type: General  Level of Consciousness: oriented and sedated  Airway & Oxygen Therapy: Patient Spontanous Breathing and Patient connected to nasal cannula oxygen  Post-op Assessment: Report given to PACU RN and Post -op Vital signs reviewed and stable  Post vital signs: stable  Complications: No apparent anesthesia complications

## 2011-07-05 NOTE — Anesthesia Postprocedure Evaluation (Signed)
Anesthesia Post Note  Patient: Melissa [name]  Procedure(s) Performed: Procedure(s) (LRB): LAPAROSCOPY OPERATIVE (N/A) LAPAROSCOPIC UNILATERAL SALPINGECTOMY (Left)  Anesthesia type: GA  Patient location: PACU  Post pain: Pain level controlled  Post assessment: Post-op Vital signs reviewed  Last Vitals:  Filed Vitals:   07/05/11 2130  BP: 143/73  Pulse: 63  Temp: 37.1 C  Resp: 18    Post vital signs: Reviewed  Level of consciousness: sedated  Complications: No apparent anesthesia complications

## 2011-07-05 NOTE — Op Note (Signed)
Melissa [name] PROCEDURE DATE: 07/05/2011  PREOPERATIVE DIAGNOSIS: Ruptured ectopic pregnancy POSTOPERATIVE DIAGNOSIS: Ruptured right fallopian tube ectopic pregnancy PROCEDURE: Laparoscopic right salpingectomy and removal of ectopic pregnancy SURGEON:  Dr. Jaynie Collins ANESTHESIOLOGIST:  Velna Hatchet, MD - Anesthesiologist Fanny Dance, CRNA - CRNA   INDICATIONS: 36 y.o. [id number] with LMP 06/10/11 here with ruptured ectopic pregnancy. On exam, she had stable vital signs, and an acute abdomen. Hgb 12.4,, blood type O POS . Patient was counseled regarding need for laparoscopic salpingectomy. Risks of surgery including bleeding which may require transfusion or reoperation, infection, injury to bowel or other surrounding organs, need for additional procedures including laparotomy and other postoperative/anesthesia complications were explained to patient.  Written informed consent was obtained.  FINDINGS:  Moderate amount of hemoperitoneum estimated to be about 200 ml of blood and clots.  Dilated right fallopian tube containing ectopic gestation. Small normal appearing uterus, normal left fallopian tube, right ovary and left ovary.  ANESTHESIA: General INTRAVENOUS FLUIDS: 1000 ml ESTIMATED BLOOD LOSS: 200 ml URINE OUTPUT: 200 ml SPECIMENS: Right fallopian tube containing ectopic gestation COMPLICATIONS: None immediate  PROCEDURE IN DETAIL:  The patient was taken to the operating room where general anesthesia was administered and was found to be adequate.  She was placed in the dorsal lithotomy position, and was prepped and draped in a sterile manner.  A Foley catheter was inserted into her bladder and attached to constant drainage and a uterine manipulator was then advanced into the uterus .  After an adequate timeout was performed, attention was then turned to the patient's abdomen where a 11-mm skin incision was made on the umbilical fold.  The Veress needle was carefully introduced into the  peritoneal cavity at a 45-degree angle into the abdominal wall.  Intraperitoneal placement was confirmed by drop in intraabdominal pressure with insufflation of carbon dioxide gas.  Adequate pneumoperitoneum was obtained, and the 11-mm trocar and sleeve were then advanced without difficulty into the abdomen where intraabdominal placement was confirmed by the laparoscope. A survey of the patient's pelvis and abdomen revealed the findings as above.  Two 5-mm left lower quadrant ports were placed under direct visualization.  The  Nezhat suction irrigator was then used to suction the hemoperitoneum and irrigate the pelvis.  Attention was then turned to the right fallopian tube which was grasped and ligated from the underlying mesosalpinx and uterine attachment using the Gyrus instrument.  Good hemostasis was noted.  The specimen was placed in an EndoCatch bag and removed from the abdomen intact.  The abdomen was desufflated, and all instruments were removed.  The fascial incisions of the 11-mm site was reapproximated with 0 Vicryl figure-of-eight stich; and all skin incisions were closed Dermabond. The patient tolerated the procedures well.  All instruments, needles, and sponge counts were correct x 2. The patient was taken to the recovery room in stable condition.   The patient will be discharged to home as per PACU criteria.  Routine postoperative instructions given.  She was prescribed Percocet, Ibuprofen and Colace.  She will follow up in the clinic in 3-4 weeks for postoperative evaluation .

## 2011-07-05 NOTE — Discharge Instructions (Signed)
Ectopic Pregnancy An ectopic pregnancy happens when a fertilized egg grows outside the uterus. A pregnancy cannot survive outside of the uterus. You may or may not need surgery to remove it. HOME CARE If you were given medicine only:  Do not drink alcohol.   Do not take vitamins with folic acid.   Only take medicine as told by your doctor.  If you had surgery:  Do not have sex (intercourse) until your doctor says it is okay.   Do not lift anything over 5 pounds (2.3 kilograms).   Only take medicine as told by your doctor. Do not take aspirin.   Avoid alcohol if you are taking pain medicine.   Change your bandages (dressings) as told by your doctor.   Keep all doctor visits as told.   Get plenty of rest.  GET HELP RIGHT AWAY IF:  You were given medicine and:  You feel sick to your stomach (nauseous).   You throw up (vomit).   You have watery poop (diarrhea).   You feel dizzy.  You had surgery and:  You have bleeding from the surgical cut (incision).   You have redness, puffiness (swelling), and pain around the surgical cut.   You have yellowish-white fluid (pus) coming out of the surgical cut.   Your surgical cut opens up.   You are short of breath.   You have chest or leg pain.   You are dizzy or pass out (faint).   You have a fever.  MAKE SURE YOU:   Understand these instructions.   Will watch your condition.   Will get help right away if you are not doing well or get worse.  Document Released: 07/27/2008 Document Revised: 01/10/2011 Document Reviewed: 03/18/2009 [facility] Patient Information 2012 [place], [place].   IT IS IMPORTANT TO RETURN BEFORE THE DATE TO HAVE YOUR BLOOD DRAWN IF YOU HAVE ANY INCREASE OF PAIN OR VAGINAL BLEEDING

## 2011-07-05 NOTE — Progress Notes (Signed)
Seen in MAU during the night today.  C/o continued RLQ pain that was worse during the night, but continues down leg.  No bleeding or d/c.

## 2011-07-05 NOTE — H&P (Signed)
None        Chief Complaint   Patient presents with   .  Abdominal Pain      HPI Melissa [name] is a 36 y.o. female who presents to MAU for abdominal pain in pregnancy. She was evaluated her last night and had a Bhcg of 3,482 and ultrasound that showed no IUP. Dr. Emelda Fear discussed with the patient possibility of ectopic pregnancy. The patient was to return in 2 days for follow up Bhcg; however, the pain increased today so she returned. She describes the pain as sharp and radiating from the right lower abdomen to her back and hip. She rates her pain a 7/10. The history was provided by the patient and her previous medical record.    Past Medical History   Diagnosis  Date   .  Abnormal Pap smear  08/30/2008       LGSIL   .  Infertility         Secondary PCOS   .  PCOS (polycystic ovarian syndrome)     .  Chlamydia         Past Surgical History   Procedure  Date   .  Wisdom tooth extraction     .  Right ear surgery         Family History   Problem  Relation  Age of Onset   .  Anesthesia problems  Neg Hx         History   Substance Use Topics   .  Smoking status:  Never Smoker    .  Smokeless tobacco:  Never Used   .  Alcohol Use:  No       OB History      Grav  Para  Term  Preterm  Abortions  TAB  SAB  Ect  Mult  Living     3  1  1   0  1    1  0  0  1      Review of Systems  Constitutional: Negative for fever and chills.  HENT: Negative.   Cardiovascular: Negative.   Gastrointestinal: Positive for nausea, vomiting and abdominal pain.  Genitourinary: Positive for pelvic pain. Negative for dysuria, frequency, vaginal bleeding and vaginal discharge.  Musculoskeletal: Positive for back pain.  Psychiatric/Behavioral: Negative for confusion and agitation.     Allergies    Spinach    Home Medications    No current outpatient prescriptions on file.   LMP 06/10/2011   Physical Exam  Nursing note and vitals reviewed. Constitutional: She is oriented to person,  place, and time. She appears well-developed and well-nourished.  HENT:   Head: Normocephalic.  Eyes: EOM are normal.  Neck: Neck supple.  Pulmonary/Chest: Effort normal.  Abdominal: Soft. There is tenderness in the right lower quadrant. There is guarding. There is no rigidity and no rebound.  Musculoskeletal: Normal range of motion.  Neurological: She is alert and oriented to person, place, and time. No cranial nerve deficit.  Skin: Skin is warm and dry.  Psychiatric: She has a normal mood and affect. Her behavior is normal. Judgment and thought content normal.     ED Course    Procedures US Ob Comp Less 14 Wks   07/05/2011  *RADIOLOGY REPORT*  Clinical Data: Right lower quadrant abdominal pain.  OBSTETRIC <14 WK [place] AND TRANSVAGINAL OB US  Technique:  Both transabdominal and transvaginal ultrasound examinations were performed for complete evaluation of the gestation as well as the  maternal uterus, adnexal regions, and pelvic cul-de-sac.  Transvaginal technique was performed to assess early pregnancy.  Comparison:  Prior pelvic ultrasound performed 03/24/2010  Intrauterine gestational sac: None seen. Yolk sac: N/A Embryo: N/A Cardiac Activity: N/A  Maternal uterus/adnexae: The uterus demonstrates a 2.3 x 2.3 x 2.1 cm right anterolateral subserosal fibroid, as well as two tiny posterior fibroids.  The uterus is otherwise unremarkable in appearance.  The endometrial echo complex measures approximately 1.3 cm in thickness.  The ovaries are within normal limits.  The right ovary measures 4.6 x 4.1 x 3.5 cm, while the left ovary measures 2.5 x 1.3 x 1.7 cm. A right adnexal anechoic cyst, measuring 3.7 cm in size, is likely physiologic in nature.  There is no definite evidence for ectopic pregnancy.  There is no evidence for ovarian torsion.  The adnexa are difficult to fully characterize due to the patient's habitus and overlying bowel gas.  Trace free fluid is noted within the pelvic cul-de-sac.   IMPRESSION:  1.  No intrauterine gestational sac seen.  Given the quantitative beta HCG level of 3276 on 07/02/2011, this raises suspicion for a spontaneous abortion.  Would follow up quantitative beta HCG level; if it continues to trend upward, this would raise concern for a non- visualized ectopic pregnancy, though follow-up pelvic ultrasound could be considered in 7-10 days for further evaluation, as deemed clinically appropriate. 2.  Fibroid uterus noted. 3.  3.7 cm right adnexal anechoic cyst is likely physiologic in nature.  This could be further assessed in two to three cycles' time, to ensure resolution.  Original Report Authenticated By: Tonia Ghent, M.D.    US Ob Transvaginal   07/05/2011  *RADIOLOGY REPORT*  Clinical Data: Right lower quadrant abdominal pain.  OBSTETRIC <14 WK [place] AND TRANSVAGINAL OB US  Technique:  Both transabdominal and transvaginal ultrasound examinations were performed for complete evaluation of the gestation as well as the maternal uterus, adnexal regions, and pelvic cul-de-sac.  Transvaginal technique was performed to assess early pregnancy.  Comparison:  Prior pelvic ultrasound performed 03/24/2010  Intrauterine gestational sac: None seen. Yolk sac: N/A Embryo: N/A Cardiac Activity: N/A  Maternal uterus/adnexae: The uterus demonstrates a 2.3 x 2.3 x 2.1 cm right anterolateral subserosal fibroid, as well as two tiny posterior fibroids.  The uterus is otherwise unremarkable in appearance.  The endometrial echo complex measures approximately 1.3 cm in thickness.  The ovaries are within normal limits.  The right ovary measures 4.6 x 4.1 x 3.5 cm, while the left ovary measures 2.5 x 1.3 x 1.7 cm. A right adnexal anechoic cyst, measuring 3.7 cm in size, is likely physiologic in nature.  There is no definite evidence for ectopic pregnancy.  There is no evidence for ovarian torsion.  The adnexa are difficult to fully characterize due to the patient's habitus and overlying bowel gas.   Trace free fluid is noted within the pelvic cul-de-sac.  IMPRESSION:  1.  No intrauterine gestational sac seen.  Given the quantitative beta HCG level of 3276 on 07/02/2011, this raises suspicion for a spontaneous abortion.  Would follow up quantitative beta HCG level; if it continues to trend upward, this would raise concern for a non- visualized ectopic pregnancy, though follow-up pelvic ultrasound could be considered in 7-10 days for further evaluation, as deemed clinically appropriate. 2.  Fibroid uterus noted. 3.  3.7 cm right adnexal anechoic cyst is likely physiologic in nature.  This could be further assessed in two to three cycles' time, to  ensure resolution.  Original Report Authenticated By: Tonia Ghent, M.D.   Results for orders placed during the hospital encounter of 07/05/11 (from the past 24 hour(s))   CBC     Status: Normal     Collection Time     07/05/11  2:46 PM       Component  Value  Range     WBC  7.4   4.0 - 10.5 (K/uL)     RBC  4.42   3.87 - 5.11 (MIL/uL)     Hemoglobin  12.4   12.0 - 15.0 (g/dL)     HCT  29.5   62.1 - 46.0 (%)     MCV  86.0   78.0 - 100.0 (fL)     MCH  28.1   26.0 - 34.0 (pg)     MCHC  32.6   30.0 - 36.0 (g/dL)     RDW  30.8   65.7 - 15.5 (%)     Platelets  293   150 - 400 (K/uL)   HCG, QUANTITATIVE, PREGNANCY     Status: Abnormal     Collection Time     07/05/11  2:46 PM       Component  Value  Range     hCG, Beta Chain, Quant, S  3063 (*)  <5 (mIU/mL)    IVLR @ 125 cc/hr Dilaudid 1 mg IV Zofran 4 mg IV   17:00 patient returned from ultrasound and pain is better after dilaudid 17:08 call from Dr. Kyung Rudd in Radiology with report of ectopic pregnancy right   Assessment:   Increased pelvic pain                         Right ectopic pregnancy                         Uterine fibroid                         Ovarian cyst   Plan:                Consult with Dr. Macon Large and he will discuss options with patient  NEESE, HOPE, NP   Attending  Addendum   36 y.o. [phone] with right ectopic pregnancy and severe pain on examination.   On exam, she had stable vital signs, stable labs. Patient was counseled regarding need for laparoscopic removal of right ectopic pregnancy, possible salpingectomy.  Risks of surgery including bleeding which may require transfusion or reoperation, infection, injury to bowel or other surrounding organs, need for additional procedures including laparotomy were explained to patient and written informed consent was obtained.  Patient has been NPO for food since yesterday, but had a cup of water at 1 pm today, and she will remain NPO for procedure.  Anesthesia and OR aware. To OR when ready.   Jaynie Collins, M.D. 07/05/2011 7:01 PM

## 2011-07-05 NOTE — ED Notes (Signed)
Patient offered Zofran for nausea and vomiting prior to leaving. Patient states she has zofran at home and just wants to go home and sleep. Patient taken to the chair via wheelchair.

## 2011-07-06 ENCOUNTER — Encounter (HOSPITAL_COMMUNITY): Payer: Self-pay | Admitting: Obstetrics & Gynecology

## 2011-07-06 LAB — GC/CHLAMYDIA PROBE AMP, GENITAL
Chlamydia, DNA Probe: NEGATIVE
GC Probe Amp, Genital: NEGATIVE

## 2011-07-17 NOTE — ED Provider Notes (Signed)
Attestation of Attending Supervision of Advanced Practitioner: Evaluation and management procedures were performed by the PA/NP/CNM/OB Fellow under my supervision/collaboration. Chart reviewed and agree with management and plan.  Hadasah Brugger V 07/17/2011 8:17 AM

## 2011-07-23 ENCOUNTER — Ambulatory Visit: Payer: Medicaid Other | Admitting: Physician Assistant

## 2011-08-01 ENCOUNTER — Encounter: Payer: Self-pay | Admitting: Obstetrics & Gynecology

## 2011-08-01 ENCOUNTER — Ambulatory Visit (INDEPENDENT_AMBULATORY_CARE_PROVIDER_SITE_OTHER): Payer: Medicaid Other | Admitting: Obstetrics & Gynecology

## 2011-08-01 VITALS — BP 130/73 | HR 91 | Temp 98.7°F | Ht 66.0 in | Wt 252.8 lb

## 2011-08-01 DIAGNOSIS — G4700 Insomnia, unspecified: Secondary | ICD-10-CM

## 2011-08-01 DIAGNOSIS — O009 Unspecified ectopic pregnancy without intrauterine pregnancy: Secondary | ICD-10-CM

## 2011-08-01 DIAGNOSIS — Z09 Encounter for follow-up examination after completed treatment for conditions other than malignant neoplasm: Secondary | ICD-10-CM

## 2011-08-01 MED ORDER — ZOLPIDEM TARTRATE 5 MG PO TABS: 5.0000 mg | ORAL_TABLET | Freq: Every evening | ORAL | Status: DC | PRN

## 2011-08-01 NOTE — Patient Instructions (Signed)
Preventive Care for Adults, Female A healthy lifestyle and preventive care can promote health and wellness. Preventive health guidelines for women include the following key practices.  A routine yearly physical is a good way to check with your caregiver about your health and preventive screening. It is a chance to share any concerns and updates on your health, and to receive a thorough exam.   Visit your dentist for a routine exam and preventive care every 6 months. Brush your teeth twice a day and floss once a day. Good oral hygiene prevents tooth decay and gum disease.   The frequency of eye exams is based on your age, health, family medical history, use of contact lenses, and other factors. Follow your caregiver's recommendations for frequency of eye exams.   Eat a healthy diet. Foods like vegetables, fruits, whole grains, low-fat dairy products, and lean protein foods contain the nutrients you need without too many calories. Decrease your intake of foods high in solid fats, added sugars, and salt. Eat the right amount of calories for you.Get information about a proper diet from your caregiver, if necessary.   Regular physical exercise is one of the most important things you can do for your health. Most adults should get at least 150 minutes of moderate-intensity exercise (any activity that increases your heart rate and causes you to sweat) each week. In addition, most adults need muscle-strengthening exercises on 2 or more days a week.   Maintain a healthy weight. The body mass index (BMI) is a screening tool to identify possible weight problems. It provides an estimate of body fat based on height and weight. Your caregiver can help determine your BMI, and can help you achieve or maintain a healthy weight.For adults 20 years and older:   A BMI below 18.5 is considered underweight.   A BMI of 18.5 to 24.9 is normal.   A BMI of 25 to 29.9 is considered overweight.   A BMI of 30 and above is  considered obese.   Maintain normal blood lipids and cholesterol levels by exercising and minimizing your intake of saturated fat. Eat a balanced diet with plenty of fruit and vegetables. Blood tests for lipids and cholesterol should begin at age 20 and be repeated every 5 years. If your lipid or cholesterol levels are high, you are over 50, or you are at high risk for heart disease, you may need your cholesterol levels checked more frequently.Ongoing high lipid and cholesterol levels should be treated with medicines if diet and exercise are not effective.   If you smoke, find out from your caregiver how to quit. If you do not use tobacco, do not start.   If you are pregnant, do not drink alcohol. If you are breastfeeding, be very cautious about drinking alcohol. If you are not pregnant and choose to drink alcohol, do not exceed 1 drink per day. One drink is considered to be 12 ounces (355 mL) of beer, 5 ounces (148 mL) of wine, or 1.5 ounces (44 mL) of liquor.   Avoid use of street drugs. Do not share needles with anyone. Ask for help if you need support or instructions about stopping the use of drugs.   High blood pressure causes heart disease and increases the risk of stroke. Your blood pressure should be checked at least every 1 to 2 years. Ongoing high blood pressure should be treated with medicines if weight loss and exercise are not effective.   If you are 55 to 34   years old, ask your caregiver if you should take aspirin to prevent strokes.   Diabetes screening involves taking a blood sample to check your fasting blood sugar level. This should be done once every 3 years, after age 45, if you are within normal weight and without risk factors for diabetes. Testing should be considered at a younger age or be carried out more frequently if you are overweight and have at least 1 risk factor for diabetes.   Breast cancer screening is essential preventive care for women. You should practice "breast  self-awareness." This means understanding the normal appearance and feel of your breasts and may include breast self-examination. Any changes detected, no matter how small, should be reported to a caregiver. Women in their 20s and 30s should have a clinical breast exam (CBE) by a caregiver as part of a regular health exam every 1 to 3 years. After age 40, women should have a CBE every year. Starting at age 40, women should consider having a mammography (breast X-ray test) every year. Women who have a family history of breast cancer should talk to their caregiver about genetic screening. Women at a high risk of breast cancer should talk to their caregivers about having magnetic resonance imaging (MRI) and a mammography every year.   The Pap test is a screening test for cervical cancer. A Pap test can show cell changes on the cervix that might become cervical cancer if left untreated. A Pap test is a procedure in which cells are obtained and examined from the lower end of the uterus (cervix).   Women should have a Pap test starting at age 21.   Between ages 21 and 29, Pap tests should be repeated every 2 years.   Beginning at age 30, you should have a Pap test every 3 years as long as the past 3 Pap tests have been normal.   Some women have medical problems that increase the chance of getting cervical cancer. Talk to your caregiver about these problems. It is especially important to talk to your caregiver if a new problem develops soon after your last Pap test. In these cases, your caregiver may recommend more frequent screening and Pap tests.   The above recommendations are the same for women who have or have not gotten the vaccine for human papillomavirus (HPV).   If you had a hysterectomy for a problem that was not cancer or a condition that could lead to cancer, then you no longer need Pap tests. Even if you no longer need a Pap test, a regular exam is a good idea to make sure no other problems are  starting.   If you are between ages 65 and 70, and you have had normal Pap tests going back 10 years, you no longer need Pap tests. Even if you no longer need a Pap test, a regular exam is a good idea to make sure no other problems are starting.   If you have had past treatment for cervical cancer or a condition that could lead to cancer, you need Pap tests and screening for cancer for at least 20 years after your treatment.   If Pap tests have been discontinued, risk factors (such as a new sexual partner) need to be reassessed to determine if screening should be resumed.   The HPV test is an additional test that may be used for cervical cancer screening. The HPV test looks for the virus that can cause the cell changes on the cervix.   The cells collected during the Pap test can be tested for HPV. The HPV test could be used to screen women aged 30 years and older, and should be used in women of any age who have unclear Pap test results. After the age of 30, women should have HPV testing at the same frequency as a Pap test.   Colorectal cancer can be detected and often prevented. Most routine colorectal cancer screening begins at the age of 50 and continues through age 75. However, your caregiver may recommend screening at an earlier age if you have risk factors for colon cancer. On a yearly basis, your caregiver may provide home test kits to check for hidden blood in the stool. Use of a small camera at the end of a tube, to directly examine the colon (sigmoidoscopy or colonoscopy), can detect the earliest forms of colorectal cancer. Talk to your caregiver about this at age 50, when routine screening begins. Direct examination of the colon should be repeated every 5 to 10 years through age 75, unless early forms of pre-cancerous polyps or small growths are found.   Hepatitis C blood testing is recommended for all people born from 1945 through 1965 and any individual with known risks for hepatitis C.    Practice safe sex. Use condoms and avoid high-risk sexual practices to reduce the spread of sexually transmitted infections (STIs). STIs include gonorrhea, chlamydia, syphilis, trichomonas, herpes, HPV, and human immunodeficiency virus (HIV). Herpes, HIV, and HPV are viral illnesses that have no cure. They can result in disability, cancer, and death. Sexually active women aged 25 and younger should be checked for chlamydia. Older women with new or multiple partners should also be tested for chlamydia. Testing for other STIs is recommended if you are sexually active and at increased risk.   Osteoporosis is a disease in which the bones lose minerals and strength with aging. This can result in serious bone fractures. The risk of osteoporosis can be identified using a bone density scan. Women ages 65 and over and women at risk for fractures or osteoporosis should discuss screening with their caregivers. Ask your caregiver whether you should take a calcium supplement or vitamin D to reduce the rate of osteoporosis.   Menopause can be associated with physical symptoms and risks. Hormone replacement therapy is available to decrease symptoms and risks. You should talk to your caregiver about whether hormone replacement therapy is right for you.   Use sunscreen with sun protection factor (SPF) of 30 or more. Apply sunscreen liberally and repeatedly throughout the day. You should seek shade when your shadow is shorter than you. Protect yourself by wearing long sleeves, pants, a wide-brimmed hat, and sunglasses year round, whenever you are outdoors.   Once a month, do a whole body skin exam, using a mirror to look at the skin on your back. Notify your caregiver of new moles, moles that have irregular borders, moles that are larger than a pencil eraser, or moles that have changed in shape or color.   Stay current with required immunizations.   Influenza. You need a dose every fall (or winter). The composition of  the flu vaccine changes each year, so being vaccinated once is not enough.   Pneumococcal polysaccharide. You need 1 to 2 doses if you smoke cigarettes or if you have certain chronic medical conditions. You need 1 dose at age 65 (or older) if you have never been vaccinated.   Tetanus, diphtheria, pertussis (Tdap, Td). Get 1 dose of   Tdap vaccine if you are younger than age 65, are over 65 and have contact with an infant, are a healthcare worker, are pregnant, or simply want to be protected from whooping cough. After that, you need a Td booster dose every 10 years. Consult your caregiver if you have not had at least 3 tetanus and diphtheria-containing shots sometime in your life or have a deep or dirty wound.   HPV. You need this vaccine if you are a woman age 26 or younger. The vaccine is given in 3 doses over 6 months.   Measles, mumps, rubella (MMR). You need at least 1 dose of MMR if you were born in 1957 or later. You may also need a second dose.   Meningococcal. If you are age 19 to 21 and a first-year college student living in a residence hall, or have one of several medical conditions, you need to get vaccinated against meningococcal disease. You may also need additional booster doses.   Zoster (shingles). If you are age 60 or older, you should get this vaccine.   Varicella (chickenpox). If you have never had chickenpox or you were vaccinated but received only 1 dose, talk to your caregiver to find out if you need this vaccine.   Hepatitis A. You need this vaccine if you have a specific risk factor for hepatitis A virus infection or you simply wish to be protected from this disease. The vaccine is usually given as 2 doses, 6 to 18 months apart.   Hepatitis B. You need this vaccine if you have a specific risk factor for hepatitis B virus infection or you simply wish to be protected from this disease. The vaccine is given in 3 doses, usually over 6 months.  Preventive Services /  Frequency Ages 19 to 39  Blood pressure check.** / Every 1 to 2 years.   Lipid and cholesterol check.** / Every 5 years beginning at age 20.   Clinical breast exam.** / Every 3 years for women in their 20s and 30s.   Pap test.** / Every 2 years from ages 21 through 29. Every 3 years starting at age 30 through age 65 or 70 with a history of 3 consecutive normal Pap tests.   HPV screening.** / Every 3 years from ages 30 through ages 65 to 70 with a history of 3 consecutive normal Pap tests.   Hepatitis C blood test.** / For any individual with known risks for hepatitis C.   Skin self-exam. / Monthly.   Influenza immunization.** / Every year.   Pneumococcal polysaccharide immunization.** / 1 to 2 doses if you smoke cigarettes or if you have certain chronic medical conditions.   Tetanus, diphtheria, pertussis (Tdap, Td) immunization. / A one-time dose of Tdap vaccine. After that, you need a Td booster dose every 10 years.   HPV immunization. / 3 doses over 6 months, if you are 26 and younger.   Measles, mumps, rubella (MMR) immunization. / You need at least 1 dose of MMR if you were born in 1957 or later. You may also need a second dose.   Meningococcal immunization. / 1 dose if you are age 19 to 21 and a first-year college student living in a residence hall, or have one of several medical conditions, you need to get vaccinated against meningococcal disease. You may also need additional booster doses.   Varicella immunization.** / Consult your caregiver.   Hepatitis A immunization.** / Consult your caregiver. 2 doses, 6 to 18 months   apart.   Hepatitis B immunization.** / Consult your caregiver. 3 doses usually over 6 months.  Ages 40 to 64  Blood pressure check.** / Every 1 to 2 years.   Lipid and cholesterol check.** / Every 5 years beginning at age 20.   Clinical breast exam.** / Every year after age 40.   Mammogram.** / Every year beginning at age 40 and continuing for as  long as you are in good health. Consult with your caregiver.   Pap test.** / Every 3 years starting at age 30 through age 65 or 70 with a history of 3 consecutive normal Pap tests.   HPV screening.** / Every 3 years from ages 30 through ages 65 to 70 with a history of 3 consecutive normal Pap tests.   Fecal occult blood test (FOBT) of stool. / Every year beginning at age 50 and continuing until age 75. You may not need to do this test if you get a colonoscopy every 10 years.   Flexible sigmoidoscopy or colonoscopy.** / Every 5 years for a flexible sigmoidoscopy or every 10 years for a colonoscopy beginning at age 50 and continuing until age 75.   Hepatitis C blood test.** / For all people born from 1945 through 1965 and any individual with known risks for hepatitis C.   Skin self-exam. / Monthly.   Influenza immunization.** / Every year.   Pneumococcal polysaccharide immunization.** / 1 to 2 doses if you smoke cigarettes or if you have certain chronic medical conditions.   Tetanus, diphtheria, pertussis (Tdap, Td) immunization.** / A one-time dose of Tdap vaccine. After that, you need a Td booster dose every 10 years.   Measles, mumps, rubella (MMR) immunization. / You need at least 1 dose of MMR if you were born in 1957 or later. You may also need a second dose.   Varicella immunization.** / Consult your caregiver.   Meningococcal immunization.** / Consult your caregiver.   Hepatitis A immunization.** / Consult your caregiver. 2 doses, 6 to 18 months apart.   Hepatitis B immunization.** / Consult your caregiver. 3 doses, usually over 6 months.  Ages 65 and over  Blood pressure check.** / Every 1 to 2 years.   Lipid and cholesterol check.** / Every 5 years beginning at age 20.   Clinical breast exam.** / Every year after age 40.   Mammogram.** / Every year beginning at age 40 and continuing for as long as you are in good health. Consult with your caregiver.   Pap test.** /  Every 3 years starting at age 30 through age 65 or 70 with a 3 consecutive normal Pap tests. Testing can be stopped between 65 and 70 with 3 consecutive normal Pap tests and no abnormal Pap or HPV tests in the past 10 years.   HPV screening.** / Every 3 years from ages 30 through ages 65 or 70 with a history of 3 consecutive normal Pap tests. Testing can be stopped between 65 and 70 with 3 consecutive normal Pap tests and no abnormal Pap or HPV tests in the past 10 years.   Fecal occult blood test (FOBT) of stool. / Every year beginning at age 50 and continuing until age 75. You may not need to do this test if you get a colonoscopy every 10 years.   Flexible sigmoidoscopy or colonoscopy.** / Every 5 years for a flexible sigmoidoscopy or every 10 years for a colonoscopy beginning at age 50 and continuing until age 75.   Hepatitis   C blood test.** / For all people born from 1945 through 1965 and any individual with known risks for hepatitis C.   Osteoporosis screening.** / A one-time screening for women ages 65 and over and women at risk for fractures or osteoporosis.   Skin self-exam. / Monthly.   Influenza immunization.** / Every year.   Pneumococcal polysaccharide immunization.** / 1 dose at age 65 (or older) if you have never been vaccinated.   Tetanus, diphtheria, pertussis (Tdap, Td) immunization. / A one-time dose of Tdap vaccine if you are over 65 and have contact with an infant, are a healthcare worker, or simply want to be protected from whooping cough. After that, you need a Td booster dose every 10 years.   Varicella immunization.** / Consult your caregiver.   Meningococcal immunization.** / Consult your caregiver.   Hepatitis A immunization.** / Consult your caregiver. 2 doses, 6 to 18 months apart.   Hepatitis B immunization.** / Check with your caregiver. 3 doses, usually over 6 months.  ** Family history and personal history of risk and conditions may change your caregiver's  recommendations. Document Released: 06/26/2001 Document Revised: 04/19/2011 Document Reviewed: 09/25/2010 [employer] Patient Information 2012 [employer]. 

## 2011-08-01 NOTE — Progress Notes (Signed)
Declines flu shot

## 2011-08-01 NOTE — Progress Notes (Signed)
  Subjective:     Melissa [name] is a 36 y.o. female who presents to the clinic 4 weeks status post laparoscopic right salpingectomy for ruptured ectopic pregnancy. Eating a regular diet without difficulty. Bowel movements are normal. The patient is not having any pain. She is very emotional about her loss and reports insomnia not helped by OTC meds or herbs.  Denies any SI/HI.  No other concerns.  The following portions of the patient's history were reviewed and updated as appropriate: allergies, current medications, past family history, past medical history, past social history, past surgical history and problem list.  Last pap smear in 08/2010 was normal.  Review of Systems Pertinent items are noted in HPI.    Objective:    BP 130/73  Pulse 91  Temp(Src) 98.7 F (37.1 C) (Oral)  Ht 5\' 6"  (1.676 m)  Wt 252 lb 12.8 oz (114.669 kg)  BMI 40.80 kg/m2  LMP 06/10/2011  Breastfeeding? Unknown General:  alert and no distress  Abdomen: soft, bowel sounds active, non-tender  Incision:   healing well, no drainage, no erythema, no hernia, no seroma, no swelling, no dehiscence, incision well approximated   Pathology  07/05/11 Fallopian tube, ectopic pregnancy, right - FALLOPIAN TUBAL TISSUE WITH ASSOCIATED IMMATURE CHORIONIC VILLI AND HEMORRHAGE, CONSISTENT WITH ECTOPIC TUBAL PREGNANCY. Microscopic Comment:There are immature chorionic villi present without evidence of hydatidiform change. Assessment:    Doing well postoperatively. Operative findings again reviewed. Pathology report discussed.    Plan:    1. Discussed that it is appropriate to grieve for her loss, recommended [facility] or support group. Also offered referral to Mental Health provider, patient declined this for now. Ambien Rx given to patient for insomnia.  2. Activity restrictions: none 4. Anticipated return to work: now. 5. Follow up soon for annual exam after 08/2011 or earlier as needed

## 2011-08-08 ENCOUNTER — Encounter: Payer: Self-pay | Admitting: Physician Assistant

## 2011-08-23 ENCOUNTER — Other Ambulatory Visit: Payer: Self-pay | Admitting: Physician Assistant

## 2011-08-23 ENCOUNTER — Other Ambulatory Visit (HOSPITAL_COMMUNITY)
Admission: RE | Admit: 2011-08-23 | Discharge: 2011-08-23 | Disposition: A | Discharge status: Home / Self Care | Payer: Medicaid Other | Source: Ambulatory Visit | Attending: Obstetrics & Gynecology | Admitting: Obstetrics & Gynecology

## 2011-08-23 ENCOUNTER — Ambulatory Visit (INDEPENDENT_AMBULATORY_CARE_PROVIDER_SITE_OTHER): Payer: Medicaid Other | Admitting: Physician Assistant

## 2011-08-23 ENCOUNTER — Encounter: Payer: Self-pay | Admitting: Physician Assistant

## 2011-08-23 VITALS — BP 128/87 | HR 74 | Temp 97.6°F

## 2011-08-23 DIAGNOSIS — Z1159 Encounter for screening for other viral diseases: Secondary | ICD-10-CM | POA: Insufficient documentation

## 2011-08-23 DIAGNOSIS — Z01419 Encounter for gynecological examination (general) (routine) without abnormal findings: Secondary | ICD-10-CM

## 2011-08-23 DIAGNOSIS — N83209 Unspecified ovarian cyst, unspecified side: Secondary | ICD-10-CM

## 2011-08-23 NOTE — Patient Instructions (Signed)
Pregnancy  If you are planning on getting pregnant, it is a good idea to make a preconception appointment with your care- giver to discuss having a healthy lifestyle before getting pregnant. Such as, diet, weight, exercise, taking prenatal vitamins especially folic acid (it helps prevent brain and spinal cord defects), avoiding alcohol, smoking and illegal drugs, medical problems (diabetes, convulsions), family history of genetic problems, working conditions and immunizations. It is better to have knowledge of these things and do something about them before getting pregnant.  In your pregnancy, it is important to follow certain guidelines to have a healthy baby. It is very important to get good prenatal care and follow your caregiver's instructions. Prenatal care includes all the medical care you receive before your baby's birth. This helps to prevent problems during the pregnancy and childbirth.  HOME CARE INSTRUCTIONS    Start your prenatal visits by the 12th week of pregnancy or before when possible. They are usually scheduled monthly at first. They are more often in the last 2 months before delivery. It is important that you keep your caregiver's appointments and follow your caregiver's instructions regarding medication use, exercise, and diet.   During pregnancy, you are providing food for you and your baby. Eat a regular, well-balanced diet. Choose foods such as meat, fish, milk and other dairy products, vegetables, fruits, whole-grain breads and cereals. Your caregiver will inform you of the ideal weight gain depending on your current height and weight. Drink lots of liquids. Try to drink 8 glasses of water a day.   Alcohol is associated with a number of birth defects including fetal alcohol syndrome. It is best to avoid alcohol completely. Smoking will cause low birth rate and prematurity. Use of alcohol and nicotine during your pregnancy also increases the chances that your child will be chemically  dependent later in their life and may contribute to SIDS (Sudden Infant Death Syndrome).   Do not use illegal drugs.   Only take prescription or over-the-counter medications that are recommended by your caregiver. Other medications can cause genetic and physical problems in the baby.   Morning sickness can often be helped by keeping soda crackers at the bedside. Eat a couple before arising in the morning.   A sexual relationship may be continued until near the end of pregnancy if there are no other problems such as early (premature) leaking of amniotic fluid from the membranes, vaginal bleeding, painful intercourse or belly (abdominal) pain.   Exercise regularly. Check with your caregiver if you are unsure of the safety of some of your exercises.   Do not use hot tubs, steam rooms or saunas. These increase the risk of fainting or passing out and hurting yourself and the baby. Swimming is OK for exercise. Get plenty of rest, including afternoon naps when possible especially in the third trimester.   Avoid toxic odors and chemicals.   Do not wear high heels. They may cause you to lose your balance and fall.   Do not lift over 5 pounds. If you do lift anything, lift with your legs and thighs, not your back.   Avoid long trips, especially in the third trimester.   If you have to travel out of the city or state, take a copy of your medical records with you.  SEEK IMMEDIATE MEDICAL CARE IF:    You develop an unexplained oral temperature above 102 F (38.9 C), or as your caregiver suggests.   You have leaking of fluid from the vagina. If   your temperature and inform your caregiver of this when you call.   There is vaginal spotting or bleeding. Notify your caregiver of the amount and how many pads are used.   You continue to feel sick to your stomach (nauseous) and have no relief from remedies suggested, or you throw up (vomit) blood or coffee ground like  materials.   You develop upper abdominal pain.   You have round ligament discomfort in the lower abdominal area. This still must be evaluated by your caregiver.   You feel contractions of the uterus.   You do not feel the baby move, or there is less movement than before.   You have painful urination.   You have abnormal vaginal discharge.   You have persistent diarrhea.   You get a severe headache.   You have problems with your vision.   You develop muscle weakness.   You feel dizzy and faint.   You develop shortness of breath.   You develop chest pain.   You have back pain that travels down to your leg and feet.   You feel irregular or a very fast heartbeat.   You develop excessive weight gain in a short period of time (5 pounds in 3 to 5 days).   You are involved with a domestic violence situation.  Document Released: 04/30/2005 Document Revised: 04/19/2011 Document Reviewed: 10/22/2008 [facility] Patient Information 2012 [place], [place].Pap Test A Pap test is a sampling of cells from a woman's cervix. The cervix is the opening between the vagina (birth canal) and the uterus (the bottom part of the womb). The cells are scraped from the cervix during a pelvic exam. These cells are then looked at under a microscope to see if the cells are normal or to see if a cancer is developing or there are changes that suggest a cancer will develop. Cervical dysplasia is a condition in which a woman has abnormal changes in the top layer of cells of her cervix. These changes are an early sign that cervical cancer may develop. Pap tests also look for the human papilloma virus (HPV) because it has 4 types that are responsible for 70% of cervical cancer. Infections can also be found during a Pap test such as bacteria, fungus, protozoa and viruses.  Cervical cancer is harder to treat and less likely to have a good outcome if left untreated. Catching the disease at an early stage leads to a better  outcome. Since the Pap test was introduced 60 years ago, deaths from cervical cancer have decreased by 70%. Every woman should keep up to date with Pap tests. RISK FACTORS FOR CERVICAL CANCER INCLUDE:   Becoming sexually active before age 76.   Being the daughter of a woman who took diethylstilbestrol (DES) during pregnancy.   Having a sexual partner who has or has had cancer of the penis.   Having a sexual partner whose past partner had cervical cancer or cervical dysplasia (early cell changes which suggest a cancer may develop).   Having a weakened immune system. An example would be HIV or other immunodeficiency disorder.   Having had a sexually transmitted infection such as chlamydia, gonorrhea or HPV.   Having had an abnormal Pap or cancer of the vagina or vulva.   Having had more than one sexual partner.   A history of cervical cancer in a woman's sister or mother.   Not using condoms with new sexual partners.   Smoking.  WHO SHOULD HAVE PAP TESTS  A Pap test is done to screen for cervical cancer.   The first Pap test should be done at age 10.   Between ages 60 and 73, Pap tests are repeated every 2 years.   Beginning at age 47, you are advised to have a Pap test every 3 years as long as your past 3 Pap tests have been normal.   Some women have medical problems that increase the chance of getting cervical cancer. Talk to your caregiver about these problems. It is especially important to talk to your caregiver if a new problem develops soon after your last Pap test. In these cases, your caregiver may recommend more frequent screening and Pap tests.   The above recommendations are the same for women who have or have not gotten the vaccine for HPV (Human Papillomavirus).   If you had a hysterectomy for a problem that was not a cancer or a condition that could lead to cancer, then you no longer need Pap tests. However, even if you no longer need a Pap test, a regular exam is a  good idea to make sure no other problems are starting.    If you are between ages 96 and 47, and you have had normal Pap tests going back 10 years, you no longer need Pap tests. However, even if you no longer need a Pap test, a regular exam is a good idea to make sure no other problems are starting.    If you have had past treatment for cervical cancer or a condition that could lead to cancer, you need Pap tests and screening for cancer for at least 20 years after your treatment.   If Pap tests have been discontinued, risk factors (such as a new sexual partner) need to be re-assessed to determine if screening should be resumed.   Some women may need screenings more often if they are at high risk for cervical cancer.  PREPARATION FOR A PAP TEST A Pap test should be performed during the weeks before the start of menstruation. Women should not douche or have sexual intercourse for 24 hours before the test. No vaginal creams, diaphragms, or tampons should be used for 24 hours before the test. To minimize discomfort, a woman should empty her bladder just before the exam. TAKING THE PAP TEST The caregiver will perform a pelvic exam. A metal or plastic instrument (speculum) is placed in the vagina. This is done before your caregiver does a bimanual exam of your internal female organs. This instrument allows your caregiver to see the inside of the vagina and look at the cervix. A small, sterile brush is used to take a sample of cells from the internal opening of the cervix. A small wooden spatula is used to scrape the outside of the cervix. Neither of these two methods to collect cells will cause you pain. These two scrapings are placed on a glass slide or in a small bottle filled with a special liquid. The cells are looked at later under a microscope in a lab. A specialist will look at these cells and determine if the cells are normal. RESULTS OF YOUR PAP TEST  A healthy Pap test shows no abnormal  cells or evidence of inflammation.   The presence of abnormally growing cells on the surface of the cervix may be reported as an abnormal Pap test. Different categories of findings are used to describe your Pap test. Your caregiver will go over the importance of these findings with you.  The caregiver will then determine what follow-up is needed or when you should have your next pap test.   If you have had two or more abnormal Pap tests:   You may be asked to have a colposcopy. This is a test in which the cervix is viewed with a special lighted microscope.   A cervical tissue sample (biopsy) may also be needed. This involves taking a small tissue sample from the cervix. The sample is looked at under a microscope to find the cause of the abnormal cells. Make sure you find out the results of the Pap test. If you have not received the results within two weeks, contact your caregiver's office for the results. Do not assume everything is normal if you have not heard from your caregiver or medical facility. It is important to follow up on all of your test results.  Document Released: 07/21/2002 Document Revised: 04/19/2011 Document Reviewed: 04/24/2011 [facility] Patient Information 2012 [place], [place].

## 2011-08-23 NOTE — Progress Notes (Signed)
SW called by Clinic staff to see patient due to social stressors and recent ectopic pregnancy.  SW met with patient who was extremely open and pleasant with SW.  She discussed stress within her family and labeled herself as the, "black sheep."  She told SW about her relationships with her husband and daughter, which sound positive for the most part.  She continually said, "it's a lot," but did not always elaborate.  She did not seem closed or guarded, but rather felt like she did not have the time to delve into all of the details.  SW highly recommends outpatient counseling, and specifically CBT.  Patient seems eager and willing to seek therapy and states she has Medicaid at this time.  She does not have a preference on where to go, so SW recommended [facility] and thinks this would be a good fit for patient.  SW gave her the brochure and offered to make referral for her or allow her to call on her own time.  She decided she would like to think about it and will call when she is ready.  SW encouraged her and explained the benefits once again.  She replied, "I'm going to get right on it."  Patient states she is not interested in taking medication and denies SI/HI.  She reports that she, her husband and her 24 year old daughter have been living in a hotel for over a year, paying 600.00 per month.  Patient states desire to find an apartment, but states she cannot pay for rent and utilities.  She states she is not working because the last time she worked, she had heart problems.  SW asked if she has had medical follow up for that.  She said everything checked out fine.  SW asked if she thinks she is suffering from Anxiety.  She considers this as a possibility.  SW states she may benefit from an anti-anxiety medication if all other medical conditions have been ruled out.  She states she has not had the shortness of breath, dizzy spells or chest pains recently and wants to apply for a job in a cafeteria  with the school system.  SW states that if she finds that these symptoms return when she seeks work, to talk with her doctor.  She agreed.  SW gave her information for [facility], which she said her husband would not be interested in, and [employer] as possible housing resources.  Patient was very appreciative and seemed to be in better spirits after talking with SW.

## 2011-08-23 NOTE — Progress Notes (Signed)
Chief Complaint:  Gynecologic Exam   Melissa [name] is  36 y.o. [phone].  Patient's last menstrual period was 08/17/2011.. She presents complaining of Gynecologic Exam  Pt presents for routine gyn exam. No Gyn complaints. Reports continued insomnia not being relieved by [place]. Pt has long standing hx of PCOS and infertility. Had ectopic pregnancy in 06/2011 that ruptured and required surgery. First menstrual cycle last week.  Obstetrical/Gynecological History: OB History    Grav Para Term Preterm Abortions TAB SAB Ect Mult Living   3 1 1  0 2  1 1  0 1      Past Medical History: Past Medical History  Diagnosis Date  . Abnormal Pap smear 08/30/2008    LGSIL  . Infertility     Secondary PCOS  . PCOS (polycystic ovarian syndrome)   . Chlamydia     Past Surgical History: Past Surgical History  Procedure Date  . Wisdom tooth extraction   . Right ear surgery   . Laparoscopy 07/05/2011    Procedure: LAPAROSCOPY OPERATIVE;  Surgeon: Tereso Newcomer, MD;  Location: [facility];  Service: Gynecology;  Laterality: N/A;    Family History: Family History  Problem Relation Age of Onset  . Anesthesia problems Neg Hx   . Diabetes Mother   . Hypertension Mother   . Cancer Father   . Fibroids Sister   . Hypertension Sister   . Crohn's disease Brother   . Cancer Brother   . Diabetes Brother   . Hypertension Brother   . Kidney disease Brother     Social History: History  Substance Use Topics  . Smoking status: Never Smoker   . Smokeless tobacco: Never Used  . Alcohol Use: No    Allergies:  Allergies  Allergen Reactions  . Spinach Itching    Review of Systems - Negative except what has been reviewed in HPI  Physical Exam   Blood pressure 128/87, pulse 74, temperature 97.6 F (36.4 C), temperature source Oral, last menstrual period 08/17/2011.  General: General appearance - alert, well appearing, and in no distress, oriented to person, place, and time and overweight Mental  status - alert, oriented to person, place, and time, normal mood, behavior, speech, dress, motor activity, and thought processes, affect appropriate to mood Eyes - left eye normal, right eye normal Nose - normal and patent, no erythema, discharge or polyps Mouth - mucous membranes moist, pharynx normal without lesions Neck - supple, no significant adenopathy Lymphatics - no palpable lymphadenopathy, no hepatosplenomegaly Chest - clear to auscultation, no wheezes, rales or rhonchi, symmetric air entry Heart - normal rate, regular rhythm, normal S1, S2, no murmurs, rubs, clicks or gallops Abdomen - soft, nontender, nondistended, no masses or organomegaly Breasts - breasts appear normal, no suspicious masses, no skin or nipple changes or axillary nodes Extremities - peripheral pulses normal, no pedal edema, no clubbing or cyanosis Focused Gynecological Exam: VULVA: normal appearing vulva with no masses, tenderness or lesions, VAGINA: normal appearing vagina with normal color and discharge, no lesions, CERVIX: normal appearing cervix without discharge or lesions, UTERUS: uterus is normal size, shape, consistency and nontender, ADNEXA: right sided fullness without pain   Assessment: Well Woman Exam 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 08/23/2011,2:14 PM

## 2011-08-27 ENCOUNTER — Ambulatory Visit (HOSPITAL_COMMUNITY)
Admission: RE | Admit: 2011-08-27 | Discharge: 2011-08-27 | Disposition: A | Discharge status: Home / Self Care | Payer: Medicaid Other | Source: Ambulatory Visit | Attending: Physician Assistant | Admitting: Physician Assistant

## 2011-08-27 ENCOUNTER — Other Ambulatory Visit: Payer: Self-pay | Admitting: Physician Assistant

## 2011-08-27 DIAGNOSIS — Z01419 Encounter for gynecological examination (general) (routine) without abnormal findings: Secondary | ICD-10-CM

## 2011-08-27 DIAGNOSIS — N83209 Unspecified ovarian cyst, unspecified side: Secondary | ICD-10-CM

## 2011-08-27 DIAGNOSIS — D259 Leiomyoma of uterus, unspecified: Secondary | ICD-10-CM | POA: Insufficient documentation

## 2011-08-29 ENCOUNTER — Telehealth: Payer: Self-pay | Admitting: *Deleted

## 2011-08-29 NOTE — Telephone Encounter (Signed)
Pt left message requesting [place] test results

## 2011-08-30 ENCOUNTER — Ambulatory Visit (INDEPENDENT_AMBULATORY_CARE_PROVIDER_SITE_OTHER): Payer: Medicaid Other | Admitting: Obstetrics & Gynecology

## 2011-08-30 ENCOUNTER — Encounter: Payer: Self-pay | Admitting: Obstetrics & Gynecology

## 2011-08-30 VITALS — BP 133/82 | HR 92 | Temp 98.1°F | Ht 66.0 in | Wt 253.2 lb

## 2011-08-30 DIAGNOSIS — D259 Leiomyoma of uterus, unspecified: Secondary | ICD-10-CM

## 2011-08-30 DIAGNOSIS — D219 Benign neoplasm of connective and other soft tissue, unspecified: Secondary | ICD-10-CM | POA: Insufficient documentation

## 2011-08-30 NOTE — Patient Instructions (Signed)
Pelvic Pain in Women, Generic  Pelvic pain may be constant or come and go. It may be mild or severe. It is important to tell your caregiver exactly where the pain is located, when and how it occurs, and if it is related to your menstrual periods or stress. We have not found a definite cause for your pelvic pain today and you may need follow-up testing and examination.  CAUSES    Sexually transmitted diseases (STDS) cause pelvic inflammatory disease (PID). This is one of the most common causes of pelvic pain. It is an infection of the female sexual organs.   Endometriosis - This is a condition where some of the inside lining of the uterus is growing in the pelvis and abdomen outside the uterus. Along with (chronic) pain, this can cause infertility.   Tubal pregnancy - This is a serious condition where the pregnancy has occurred in a fallopian tube. Rupture of the tube can bleed heavily and cause death if it is not found in time.   Interstitial cystitis is an inflammation of the bladder that causes pelvic pain. People with severe cases of IC may urinate as many as 60 times a day.   Fibroids: A small percentage of women have uterine fibroids (non-cancerous smooth muscle growths in the uterus). Fibroids do not always cause pain.   Fibromyalgia is a disorder with symptoms of widespread muscle pain, fatigue and multiple tender points on the body.   Dysmenorrhea is painful menstrual periods.   M[place]lesmertz is pain with ovulation.   Pelvic congestive syndrome, is engorgement of the pelvic veins just before and during a menstrual period.   Cervical stenosis is when the opening of the cervix is too small and causes pain during menstruation.   Adenomyosis (a type of endometriosis) glands that line the inside of the uterus lying in the muscle layer of the uterus.   Intestinal problems such as irritable bowel syndrome colitis or ileitis.   Appendicitis.   Pelvic cancer. Usually the cancer has been there for awhile  before causing pain.   Bladder infection.   Cysts or ovarian tumors.   Kidney stone.   Psychological factors (stress, sexual abuse or depression).   IUD (intrauterine device).   Prolapse (falling down of the uterus).   Retroflexed uterus - the uterus is tipped too far backwards.   Muscle spasms of the pelvic muscles.   Muscular-skeletal problems of the back (herniated disc).  DIAGNOSIS    Your caregiver may order testing, such as:   Blood tests.   Cultures to test for infection.   Ultrasound.   Looking into the bladder with a metal tube with a light (cystoscopy).   Looking into the pelvis and abdomen with very small incisions through a metal tube with a light (laparoscopy).   Looking into the large intestine with a fibro-optic tube with a light (colonoscopy).   CT scan - a type of X-ray to view the internal organs of the pelvis and abdomen.   MRI - views the pelvic and abdominal organs with a magnetic machine.   Intravenous pyelogram - views the kidneys, ureter and bladder after injecting dye through the vein by X-rays.   Injecting barium into the large intestine to view the intestine with X-rays (barium enema).   Not all test results are available during your visit. If your test results are not back during the visit, make an appointment with your caregiver or the medical facility. It is important for you to follow up   on all of your test results.  TREATMENT   Treatment will depend on the cause of the pain, such as:   Medication, antibiotics, pain medication, muscle relaxants, anti-depression drugs, hormones or birth control pills.   Physical therapy.   Acupuncture.   Psychiatric counseling.   Nerve blocks.   Surgery.  HOME CARE INSTRUCTIONS    Finish all medication as prescribed. Incomplete treatment will put you at risk for sterility and tubal pregnancy if your caregiver feels your pain is caused by an infection.   Rest and eat a balanced diet with plenty of fluids.   If you do have an  infection, your recent sexual partners may need treatment even if they are symptom-free or have a negative culture or evaluation. You also need follow-up to make sure you are no longer infected.   Only take over-the-counter or prescription medicines for pain, discomfort or fever as directed by your caregiver.   Apply warm or cold compresses to the lower abdomen depending on which one helps the pain.   Avoid stressful situations that may cause the pain.   Group therapy is sometimes helpful.   Make sure to follow all instructions. Some of the conditions listed above can have very serious outcomes if you do not take the time to follow-up with your caregiver.  SEEK IMMEDIATE MEDICAL CARE IF:    There is heavier bleeding from the birth canal (vagina).   You develop increasing abdominal pain.   You feel lightheaded or pass out.   An unexplained oral temperature above 102 F (38.9 C) develops.   Any of the problems which brought you to us are getting worse.   You are being physically or sexually abused.   You have painful urination.   You are still having pain four hours after taking prescription pain medication.   You have uncontrolled diarrhea.   You have abnormal vaginal discharge.  Document Released: 03/27/2004 Document Revised: 04/19/2011 Document Reviewed: 04/27/2008  [employer] Patient Information 2012 [employer].

## 2011-08-30 NOTE — Progress Notes (Signed)
Patient ID: Melissa [name], female   DOB: 1975/12/22, 36 y.o.   MRN: [id number] Patient's last menstrual period was 08/17/2011. Patient returns to review [place] result from 08/23/11.  Had exam by [place] 08/23/11. Notes regular menses, some pelvic pain, trying to conceive after recent ectopic pregnancy. Past Medical History  Diagnosis Date  . Abnormal Pap smear 08/30/2008    LGSIL  . Infertility     Secondary PCOS  . PCOS (polycystic ovarian syndrome)   . Chlamydia    Past Surgical History  Procedure Date  . Wisdom tooth extraction   . Right ear surgery   . Laparoscopy 07/05/2011    Procedure: LAPAROSCOPY OPERATIVE;  Surgeon: Tereso Newcomer, MD;  Location: [facility];  Service: Gynecology;  Laterality: N/A;  Current outpatient prescriptions:cetirizine (ZYRTEC) 10 MG tablet, Take 10 mg by mouth as needed., Disp: , Rfl: ;  ondansetron (ZOFRAN) 4 MG tablet, Take 4 mg by mouth every 4 (four) hours as needed. For nausea, Disp: , Rfl: ;  Prenatal Vit-Fe Psac Cmplx-FA (PRENATAL MULTIVITAMIN) 60-1 MG tablet, Take 1 tablet by mouth daily.  , Disp: , Rfl:  zolpidem (AMBIEN) 5 MG tablet, Take 1-2 tablets (5-10 mg total) by mouth at bedtime as needed for sleep., Disp: 30 tablet, Rfl: 5;  DISCONTD: diphenhydrAMINE (BENADRYL) 12.5 MG/5ML liquid, Take 12.5 mg by mouth once., Disp: , Rfl:  Allergies  Allergen Reactions  . Spinach Itching   Filed Vitals:   08/30/11 1318  BP: 133/82  Pulse: 92  Temp: 98.1 F (36.7 C)  TempSrc: Oral  Height: 5\' 6"  (1.676 m)  Weight: 114.851 kg (253 lb 3.2 oz)   NAD, pleasant, normal affect    *RADIOLOGY REPORT*  Clinical Data: Reevaluate right ovarian cyst. Salpingectomy  February 2013 for ectopic pregnancy  TRANSVAGINAL ULTRASOUND OF PELVIS  Technique: Transvaginal ultrasound examination of the pelvis was  performed including evaluation of the uterus, ovaries, adnexal  regions, and pelvic cul-de-sac.  Comparison: 07/05/2011  Findings:  Uterus:  Anteverted, anteflexed. 9.1 x 6.3 x 4.5 cm. Right  lateral subserosal / intramural uterine fibroid measures 2.2 x 2.2  x 2.0 cm.  Endometrium: 8 mm. Trilaminar in appearance without focal  abnormality.  Right ovary: 2.6 x 2.0 x 1.6 cm. Normal.  Left ovary: 2.7 x 1.9 x 1.4 cm. Normal.  Other Findings: Trace free fluid.  IMPRESSION:  Uterine fibroid. Otherwise normal exam. Apparent interval  resolution of previously seen right ovarian cyst.  Original Report Authenticated By: Harrel Lemon, M.D.      Imp: No ovarian cyst, 2.2 cm intramural fibroid, o/w normal [place]  Plan: RTC yearly, report sx.  Lelar Farewell 08/30/11

## 2011-08-30 NOTE — Telephone Encounter (Signed)
Telephoned patient at home # and advised results showed fibroid. Patient coming to discuss results today at 1:45.

## 2011-09-19 ENCOUNTER — Other Ambulatory Visit: Payer: Self-pay

## 2011-09-19 DIAGNOSIS — Z3490 Encounter for supervision of normal pregnancy, unspecified, unspecified trimester: Secondary | ICD-10-CM

## 2011-09-19 LAB — HCG, QUANTITATIVE, PREGNANCY: hCG, Beta Chain, Quant, S: 1310 m[IU]/mL

## 2011-09-20 ENCOUNTER — Other Ambulatory Visit: Payer: Self-pay

## 2011-09-20 ENCOUNTER — Telehealth: Payer: Self-pay | Admitting: *Deleted

## 2011-09-20 DIAGNOSIS — Z3201 Encounter for pregnancy test, result positive: Secondary | ICD-10-CM

## 2011-09-20 NOTE — Telephone Encounter (Signed)
Spoke with patient about her results of quant. Per Melissa [name] patient will come in tomorrow for a repeat Beta HCG and run it stat.  Pt agrees with plan.

## 2011-09-20 NOTE — Telephone Encounter (Signed)
Message copied by Mannie Stabile on Thu Sep 20, 2011  8:34 AM ------      Message from: Zola Button L      Created: Wed Sep 19, 2011  2:16 PM      Regarding: results       Melissa [name] called wanting results of her lab work.  Maylon Cos ordered a quant that was drawn today.            I told patient that lab work would be back tomorrow (per TT), and that once Rosalita Chessman looked over it, you all would call her with results.

## 2011-09-21 ENCOUNTER — Other Ambulatory Visit: Payer: Self-pay

## 2011-09-21 DIAGNOSIS — Z3201 Encounter for pregnancy test, result positive: Secondary | ICD-10-CM

## 2011-09-21 NOTE — Progress Notes (Signed)
Patient ID: Melissa [name], female   DOB: 09-13-1975, 36 y.o.   MRN: [id number] Received phone call from lab with results of quant HCG of 2379 (from 1300 09/19/2011). Patient contacted by phone and reports doing well, denying any pelvic pain or vaginal bleeding. Pelvic ultrasound scheduled on May 14 at 10:15am to assess location of pregnancy. Ectopic precautions reviewed with patient.

## 2011-09-25 ENCOUNTER — Ambulatory Visit (HOSPITAL_COMMUNITY)
Admission: RE | Admit: 2011-09-25 | Discharge: 2011-09-25 | Disposition: A | Discharge status: Home / Self Care | Payer: Medicaid Other | Source: Ambulatory Visit | Attending: Obstetrics and Gynecology | Admitting: Obstetrics and Gynecology

## 2011-09-25 ENCOUNTER — Encounter (HOSPITAL_COMMUNITY): Payer: Self-pay

## 2011-09-25 DIAGNOSIS — O3410 Maternal care for benign tumor of corpus uteri, unspecified trimester: Secondary | ICD-10-CM | POA: Insufficient documentation

## 2011-09-25 DIAGNOSIS — O09529 Supervision of elderly multigravida, unspecified trimester: Secondary | ICD-10-CM | POA: Insufficient documentation

## 2011-09-25 DIAGNOSIS — Z3201 Encounter for pregnancy test, result positive: Secondary | ICD-10-CM

## 2011-09-26 ENCOUNTER — Telehealth: Payer: Self-pay | Admitting: *Deleted

## 2011-09-26 DIAGNOSIS — O0910 Supervision of pregnancy with history of ectopic or molar pregnancy, unspecified trimester: Secondary | ICD-10-CM

## 2011-09-26 NOTE — Telephone Encounter (Signed)
Pt left message wanting to know if she needs appt for her pregnancy.  Chart reviewed and pt had [place] yesterday showing [redacted]w[redacted]d gest. sac and yolk sac but no fetal pole. Call will be returned to pt after consult w/Dr. Constant later today.

## 2011-09-27 NOTE — Telephone Encounter (Addendum)
Consult w/ Dr. Jolayne Panther today. She ordered repeat US in 2 wks for viability, then begin prenatal care @ [facility] if appropriate.  Also, pt needs to begin taking prenatal vitamins.  I called pt and explained her [place] results and that she will need a repeat US to confirm pregnancy prior to beginning prenatal care.  I scheduled her follow up US on 10/09/11 @ 1130.  Pt voiced understanding.

## 2011-10-09 ENCOUNTER — Ambulatory Visit (HOSPITAL_COMMUNITY)
Admission: RE | Admit: 2011-10-09 | Discharge: 2011-10-09 | Disposition: A | Discharge status: Home / Self Care | Payer: Medicaid Other | Source: Ambulatory Visit | Attending: Obstetrics and Gynecology | Admitting: Obstetrics and Gynecology

## 2011-10-09 DIAGNOSIS — O3410 Maternal care for benign tumor of corpus uteri, unspecified trimester: Secondary | ICD-10-CM | POA: Insufficient documentation

## 2011-10-09 DIAGNOSIS — O09529 Supervision of elderly multigravida, unspecified trimester: Secondary | ICD-10-CM | POA: Insufficient documentation

## 2011-10-09 DIAGNOSIS — O3680X0 Pregnancy with inconclusive fetal viability, not applicable or unspecified: Secondary | ICD-10-CM | POA: Insufficient documentation

## 2011-10-09 DIAGNOSIS — O0910 Supervision of pregnancy with history of ectopic or molar pregnancy, unspecified trimester: Secondary | ICD-10-CM

## 2011-10-24 ENCOUNTER — Ambulatory Visit (INDEPENDENT_AMBULATORY_CARE_PROVIDER_SITE_OTHER): Payer: Medicaid Other | Admitting: Physician Assistant

## 2011-10-24 ENCOUNTER — Encounter: Payer: Self-pay | Admitting: Physician Assistant

## 2011-10-24 VITALS — BP 129/79 | Temp 97.8°F | Wt 244.0 lb

## 2011-10-24 DIAGNOSIS — K219 Gastro-esophageal reflux disease without esophagitis: Secondary | ICD-10-CM

## 2011-10-24 DIAGNOSIS — O99280 Endocrine, nutritional and metabolic diseases complicating pregnancy, unspecified trimester: Secondary | ICD-10-CM

## 2011-10-24 DIAGNOSIS — IMO0002 Reserved for concepts with insufficient information to code with codable children: Secondary | ICD-10-CM

## 2011-10-24 DIAGNOSIS — E079 Disorder of thyroid, unspecified: Secondary | ICD-10-CM

## 2011-10-24 DIAGNOSIS — Z3480 Encounter for supervision of other normal pregnancy, unspecified trimester: Secondary | ICD-10-CM

## 2011-10-24 LAB — DIFFERENTIAL
Lymphocytes Relative: 35 % (ref 12–46)
Lymphs Abs: 1.4 10*3/uL (ref 0.7–4.0)
Monocytes Absolute: 0.2 10*3/uL (ref 0.1–1.0)
Monocytes Relative: 6 % (ref 3–12)
Neutro Abs: 2.2 10*3/uL (ref 1.7–7.7)

## 2011-10-24 LAB — CBC
HCT: 38.2 % (ref 36.0–46.0)
Hemoglobin: 12.8 g/dL (ref 12.0–15.0)
WBC: 3.9 10*3/uL — ABNORMAL LOW (ref 4.0–10.5)

## 2011-10-24 LAB — POCT URINALYSIS DIP (DEVICE)
Ketones, ur: NEGATIVE mg/dL
Leukocytes, UA: NEGATIVE
Protein, ur: NEGATIVE mg/dL
pH: 5.5 (ref 5.0–8.0)

## 2011-10-24 MED ORDER — FAMOTIDINE 20 MG PO TABS: 20.0000 mg | ORAL_TABLET | Freq: Two times a day (BID) | ORAL | Status: DC

## 2011-10-24 NOTE — Progress Notes (Signed)
Pulse 88.    Abdominal pain; no pressure.

## 2011-10-24 NOTE — Patient Instructions (Signed)
I have sent in a prescription for Pepcid to your pharmacy.  Please see attached information about lifestyle and dietary changes regarding reflux. Continue your prenatal vitamin.    Here are some basic nutrition rules to remember:  "Eat Real Foods & Drink Real Drinks" - if you think it was made in a factory . . it is likely best to avoid it as a staple in your diet.  Limiting these types of foods to 1-2 times per week is a good idea.  Sticking with fresh fruits and vegetables as well as home cooked meals will typically provide more nutrition and less salt than prepackaged meals.     Limit the amount of sugar sweetened and artificially sweetened foods and beverages.  Sticking with water flavored with a slice of lemon, lime or orange is a great option if you want something with flavor in it.  Using flavored seltzer water to flavor plain water will also add some bite if you want something more than flavor.     Here are 2 of my favorite web sites that provide great nutrition and exercise advice.   www.eatsmartmovemore[place].com www.DisposableNylon.be   Heartburn During Pregnancy  Heartburn is a burning sensation in the chest caused by stomach acid backing up into the esophagus. Heartburn (also known as "reflux") is common in pregnancy because a certain hormone (progesterone) changes. The progesterone hormone may relax the valve that separates the esophagus from the stomach. This allows acid to go up into the esophagus, causing heartburn. Heartburn may also happen in pregnancy because the enlarging uterus pushes up on the stomach, which pushes more acid into the esophagus. This is especially true in the later stages of pregnancy. Heartburn problems usually go away after giving birth. CAUSES   The progesterone hormone.   Changing hormone levels.   The growing uterus that pushes stomach acid upward.   Large meals.   Certain foods and drinks.   Exercise.   Increased acid production.  SYMPTOMS     Burning pain in the chest or lower throat.   Bitter taste in the mouth.   Coughing.  DIAGNOSIS  Heartburn is typically diagnosed by your caregiver when taking a careful history of your concern. Your caregiver may order a blood test to check for a certain type of bacteria that is associated with heartburn. Sometimes, heartburn is diagnosed by prescribing a heartburn medicine to see if the symptoms improve. It is rare in pregnancy to have a procedure called an endoscopy. This is when a tube with a light and a camera on the end is used to examine the esophagus and the stomach. TREATMENT   Your caregiver may tell you to use certain over-the-counter medicines (antacids, acid reducers) for mild heartburn.   Your caregiver may prescribe medicines to decrease stomach acid or to protect your stomach lining.   Your caregiver may recommend certain diet changes.   For severe cases, your caregiver may recommend that the head of the bed be elevated on blocks. (Sleeping with more pillows is not an effective treatment as it only changes the position of your head and does not improve the main problem of stomach acid refluxing into the esophagus.)  HOME CARE INSTRUCTIONS   Take all medicines as directed by your caregiver.   Raise the head of your bed by putting blocks under the legs if instructed to by your caregiver.   Do not exercise right after eating.   Avoid eating 2 or 3 hours before bed. Do not lie  down right after eating.   Eat small meals throughout the day instead of 3 large meals.   Identify foods and beverages that make your symptoms worse and avoid them. Foods you may want to avoid include:   Peppers.   Chocolate.   High-fat foods, including fried foods.   Spicy foods.   Garlic and onions.   Citrus fruits, including oranges, grapefruit, lemons, and limes.   Food containing tomatoes or tomato products.   Mint.   Carbonated and caffeinated drinks.   Vinegar.  SEEK  IMMEDIATE MEDICAL CARE IF:   You have severe chest pain that goes down your arm or into your jaw or neck.   You feel sweaty, dizzy, or lightheaded.   You become short of breath.   You vomit blood.   You have difficulty or pain with swallowing.   You have bloody or black, tarry stools.   You have episodes of heartburn more than 3 times a week, for more than 2 weeks.  MAKE SURE YOU:  Understand these instructions.   Will watch your condition.   Will get help right away if you are not doing well or get worse.  Document Released: 04/27/2000 Document Revised: 04/19/2011 Document Reviewed: 10/19/2010 [facility] Patient Information 2012 [place], [place].

## 2011-10-24 NOTE — Progress Notes (Signed)
Subjective:    Melissa [name] is a [id number] [redacted]w[redacted]d being seen today for her first obstetrical visit.  Her obstetrical history is significant for obesity and PCOS. Pregnancy history fully reviewed.  Patient reports heartburn, no bleeding, no contractions, no cramping, no leaking and no vomiting, diarrhea, hematachezia, or melana.  Pt reports heartburn worse at night when initially laying down and when eating large fatty/spicy meal.  Eats immediately prior to bed time.  Filed Vitals:   10/24/11 1002  BP: 129/79  Temp: 97.8 F (36.6 C)  Weight: 244 lb (110.678 kg)    HISTORY: OB History    Grav Para Term Preterm Abortions TAB SAB Ect Mult Living   4 1 1  0 2  1 1  0 1     # Outc Date GA Lbr Len/2nd Wgt Sex Del Anes PTL Lv   1 TRM 12/01           2 ECT 2/13           Comments: System Generated. Please review and update pregnancy details.   3 SAB            4 CUR              Past Medical History  Diagnosis Date  . Abnormal Pap smear 08/30/2008    LGSIL  . Infertility     Secondary PCOS  . PCOS (polycystic ovarian syndrome)   . Chlamydia    Past Surgical History  Procedure Date  . Wisdom tooth extraction   . Right ear surgery   . Laparoscopy 07/05/2011    Procedure: LAPAROSCOPY OPERATIVE;  Surgeon: Tereso Newcomer, MD;  Location: [facility];  Service: Gynecology;  Laterality: N/A;   Family History  Problem Relation Age of Onset  . Anesthesia problems Neg Hx   . Diabetes Mother   . Hypertension Mother   . Cancer Father   . Fibroids Sister   . Hypertension Sister   . Crohn's disease Brother   . Cancer Brother   . Diabetes Brother   . Hypertension Brother   . Kidney disease Brother      Exam    Uterus:     Pelvic Exam:    Perineum: No Hemorrhoids   Vulva: normal   Vagina:  Normal vaginal introitus   Cervix: deferred; PAP smear 2 months prior   Adnexa: normal adnexa and no mass, fullness, tenderness   Bony Pelvis: average  System: Breast:     Skin: normal  coloration and turgor, no rashes    Neurologic: oriented, normal, normal mood, gait normal; reflexes normal and symmetric   Extremities: normal strength, tone, and muscle mass, no deformities, no erythema, induration, or nodules   HEENT extra ocular movement intact, sclera clear, anicteric and oropharynx clear, no lesions   Mouth/Teeth mucous membranes moist, pharynx normal without lesions   Neck supple   Cardiovascular: regular rate and rhythm   Respiratory:  appears well, vitals normal, no respiratory distress, acyanotic, normal RR, ear and throat exam is normal, neck free of mass or lymphadenopathy, chest clear, no wheezing, crepitations, rhonchi, normal symmetric air entry   Abdomen: soft, non-tender; bowel sounds normal; no masses,  no organomegaly and post laparscopy incisions healed well   Urinary: urethral meatus normal      Assessment:    Pregnancy: [id number] Patient Active Problem List  Diagnosis  . OTITIS MEDIA, RECURRENT  . BACTERIAL VAGINITIS  . LGSIL (low grade squamous intraepithelial lesion) on Pap smear  .  PCOS (polycystic ovarian syndrome)  . Amenorrhea  . Obesity  . Fatigue  . Syncope  . Ruptured ectopic pregnancy  . Fibroid uterus        Plan:     Initial labs drawn; GC/Chlamydia Urine Probe sent Prenatal vitamins. Problem list reviewed and updated. Genetic Screening discussed Quad Screen and Amniocentesis: undecided but leaning towards declined.  Ultrasound discussed; fetal survey: requested.  Follow up in 3 weeks. 50% of 45 min visit spent on counseling and coordination of care.  Early Glucola today  GERD: will rx Pepcid; lifestyle/dietary modification   Andrena Mews, DO [facility] Resident - PGY-1 10/24/2011 10:58 AM

## 2011-10-25 LAB — GC/CHLAMYDIA PROBE AMP, URINE: GC Probe Amp, Urine: NEGATIVE

## 2011-10-25 LAB — RPR

## 2011-10-28 ENCOUNTER — Other Ambulatory Visit: Payer: Self-pay | Admitting: Physician Assistant

## 2011-10-28 MED ORDER — CEPHALEXIN 500 MG PO CAPS: 500.0000 mg | ORAL_CAPSULE | Freq: Three times a day (TID) | ORAL | Status: AC

## 2011-10-29 ENCOUNTER — Telehealth: Payer: Self-pay | Admitting: *Deleted

## 2011-10-29 LAB — HEMOGLOBINOPATHY EVALUATION
Hgb A: 97.1 % (ref 96.8–97.8)
Hgb F Quant: 0 % (ref 0.0–2.0)
Hgb S Quant: 0 %

## 2011-10-29 NOTE — Telephone Encounter (Signed)
Called pt and  Informed her of +UTI and need for medication.  She may pick up her Rx today. Pt voiced understanding.

## 2011-10-29 NOTE — Telephone Encounter (Signed)
Message copied by Jill Side on Mon Oct 29, 2011  3:46 PM ------      Message from: Melissa [name]      Created: Sun Oct 28, 2011 11:51 PM       Please call patient with result. Rx for ABX sent to pharmacy

## 2011-11-05 ENCOUNTER — Inpatient Hospital Stay (HOSPITAL_COMMUNITY)
Admission: AD | Admit: 2011-11-05 | Discharge: 2011-11-05 | Disposition: A | Discharge status: Home / Self Care | Payer: Medicaid Other | Source: Ambulatory Visit | Attending: Obstetrics and Gynecology | Admitting: Obstetrics and Gynecology

## 2011-11-05 ENCOUNTER — Encounter (HOSPITAL_COMMUNITY): Payer: Self-pay | Admitting: *Deleted

## 2011-11-05 DIAGNOSIS — K219 Gastro-esophageal reflux disease without esophagitis: Secondary | ICD-10-CM

## 2011-11-05 DIAGNOSIS — J069 Acute upper respiratory infection, unspecified: Secondary | ICD-10-CM | POA: Insufficient documentation

## 2011-11-05 DIAGNOSIS — O99891 Other specified diseases and conditions complicating pregnancy: Secondary | ICD-10-CM | POA: Insufficient documentation

## 2011-11-05 DIAGNOSIS — O26859 Spotting complicating pregnancy, unspecified trimester: Secondary | ICD-10-CM | POA: Insufficient documentation

## 2011-11-05 HISTORY — DX: Benign neoplasm of connective and other soft tissue, unspecified: D21.9

## 2011-11-05 LAB — URINALYSIS, ROUTINE W REFLEX MICROSCOPIC
Glucose, UA: NEGATIVE mg/dL
Leukocytes, UA: NEGATIVE
Specific Gravity, Urine: >1.03 — ABNORMAL HIGH (ref 1.005–1.030)
Urobilinogen, UA: 0.2 mg/dL (ref 0.0–1.0)

## 2011-11-05 LAB — URINE MICROSCOPIC-ADD ON

## 2011-11-05 LAB — WET PREP, GENITAL: Yeast Wet Prep HPF POC: NONE SEEN

## 2011-11-05 NOTE — MAU Provider Note (Signed)
Melissa [name] [id number] @[redacted]w[redacted]d  by LMP No chief complaint on file.    First Provider Initiated Contact with Patient 11/05/11 1722      SUBJECTIVE  HPI: Pt presents with brown discharge when wiping x2 today.  She receives prenatal care at [facility] for previous ectopic pregnancy and has had one normal u/s at 7 weeks with this pregnancy.  Pt had intercourse yesterday.  She denies LOF, bright red vaginal bleeding, vaginal itching/burning, urinary symptoms, h/a, dizziness, n/v, or fever/chills.  She also reports pain in her right ear and mild sore throat starting yesterday.  Past Medical History  Diagnosis Date  . Abnormal Pap smear 08/30/2008    LGSIL  . Infertility     Secondary PCOS  . PCOS (polycystic ovarian syndrome)   . Chlamydia   . Fibroid    Past Surgical History  Procedure Date  . Wisdom tooth extraction   . Right ear surgery   . Laparoscopy 07/05/2011    Procedure: LAPAROSCOPY OPERATIVE;  Surgeon: Tereso Newcomer, MD;  Location: [facility];  Service: Gynecology;  Laterality: N/A;  . Ear tube removal    History   Social History  . Marital Status: Married    Spouse Name: N/A    Number of Children: N/A  . Years of Education: N/A   Occupational History  . Not on file.   Social History Main Topics  . Smoking status: Never Smoker   . Smokeless tobacco: Never Used  . Alcohol Use: No  . Drug Use: No  . Sexually Active: Yes    Birth Control/ Protection: None   Other Topics Concern  . Not on file   Social History Narrative  . No narrative on file   No current facility-administered medications on file prior to encounter.   Current Outpatient Prescriptions on File Prior to Encounter  Medication Sig Dispense Refill  . cephALEXin (KEFLEX) 500 MG capsule Take 1 capsule (500 mg total) by mouth 3 (three) times daily.  21 capsule  0  . cetirizine (ZYRTEC) 10 MG tablet Take 10 mg by mouth as needed.      . famotidine (PEPCID) 20 MG tablet Take 1 tablet (20 mg total) by  mouth 2 (two) times daily.  60 tablet  3  . Prenatal Vit-Fe Psac Cmplx-FA (PRENATAL MULTIVITAMIN) 60-1 MG tablet Take 1 tablet by mouth daily.        Marland Kitchen zolpidem (AMBIEN) 5 MG tablet Take 1-2 tablets (5-10 mg total) by mouth at bedtime as needed for sleep.  30 tablet  5  . DISCONTD: diphenhydrAMINE (BENADRYL) 12.5 MG/5ML liquid Take 12.5 mg by mouth once.       Allergies  Allergen Reactions  . Spinach Itching    ROS: Pertinent items in HPI  OBJECTIVE Blood pressure 139/81, pulse 94, temperature 98.8 F (37.1 C), temperature source Oral, resp. rate 16, height 5\' 5"  (1.651 m), weight 110.337 kg (243 lb 4 oz), last menstrual period 08/17/2011, not currently breastfeeding.  GENERAL: Well-developed, well-nourished female in no acute distress.  HEENT: Normocephalic, good dentition Physical Examination: Ears - left ear normal, right ear canal with mild erythema, TM normal HEART: normal rate RESP: normal effort ABDOMEN: Soft, nontender EXTREMITIES: Nontender, no edema NEURO: Alert and oriented Pelvic exam: Cervix pink, visually closed, without lesion, nonfriable, scant brown blood at cervical os, vaginal walls and external genitalia normal Bimanual exam: Cervix 0/long/high, firm, anterior, neg CMT, uterus nontender, ~11week size, adnexa without tenderness, enlargement, or mass   LAB RESULTS Results  for orders placed during the hospital encounter of 11/05/11 (from the past 24 hour(s))  URINALYSIS, ROUTINE W REFLEX MICROSCOPIC     Status: Abnormal   Collection Time   11/05/11  4:26 PM      Component Value Range   Color, Urine YELLOW  YELLOW   APPearance CLOUDY (*) CLEAR   Specific Gravity, Urine >1.030 (*) 1.005 - 1.030   pH 6.0  5.0 - 8.0   Glucose, UA NEGATIVE  NEGATIVE mg/dL   Hgb urine dipstick LARGE (*) NEGATIVE   Bilirubin Urine NEGATIVE  NEGATIVE   Ketones, ur 15 (*) NEGATIVE mg/dL   Protein, ur NEGATIVE  NEGATIVE mg/dL   Urobilinogen, UA 0.2  0.0 - 1.0 mg/dL   Nitrite NEGATIVE   NEGATIVE   Leukocytes, UA NEGATIVE  NEGATIVE  URINE MICROSCOPIC-ADD ON     Status: Abnormal   Collection Time   11/05/11  4:26 PM      Component Value Range   Squamous Epithelial / LPF MANY (*) RARE   WBC, UA 3-6  <3 WBC/hpf   Bacteria, UA FEW (*) RARE   Urine-Other MUCOUS PRESENT    WET PREP, GENITAL     Status: Abnormal   Collection Time   11/05/11  5:34 PM      Component Value Range   Yeast Wet Prep HPF POC NONE SEEN  NONE SEEN   Trich, Wet Prep NONE SEEN  NONE SEEN   Clue Cells Wet Prep HPF POC NONE SEEN  NONE SEEN   WBC, Wet Prep HPF POC MODERATE (*) NONE SEEN    IMAGING Bedside sono indicates active fetus.  Pt reassured by fetal movement and visible FHR.  ASSESSMENT Vaginal spotting in first trimester URI  PLAN D/C home with bleeding precautions Pelvic rest while bleeding GC Chlamydia pending Encourage increase in PO fluids F/U with HRC Return to MAU as needed   [place]IR[place], Esgar Barnick 11/05/2011 5:40 PM

## 2011-11-05 NOTE — Discharge Instructions (Signed)
Vaginal Bleeding During Pregnancy, First Trimester  A small amount of bleeding (spotting) is relatively common in early pregnancy. It usually stops on its own. There are many causes for bleeding or spotting in early pregnancy. Some bleeding may be related to the pregnancy and some may not. Cramping with the bleeding is more serious and concerning. Tell your caregiver if you have any vaginal bleeding.   CAUSES    It is normal in most cases.   The pregnancy ends (miscarriage).   The pregnancy may end (threatened miscarriage).   Infection or inflammation of the cervix.   Growths (polyps) on the cervix.   Pregnancy happens outside of the uterus and in a fallopian tube (tubal pregnancy).   Many tiny cysts in the uterus instead of pregnancy tissue (molar pregnancy).  SYMPTOMS   Vaginal bleeding or spotting with or without cramps.  DIAGNOSIS   To evaluate the pregnancy, your caregiver may:   Do a pelvic exam.   Take blood tests.   Do an ultrasound.  It is very important to follow your caregiver's instructions.   TREATMENT    Evaluation of the pregnancy with blood tests and ultrasound.   Bed rest (getting up to use the bathroom only).   Rho-gam immunization if the mother is Rh negative and the father is Rh positive.  HOME CARE INSTRUCTIONS    If your caregiver orders bed rest, you may need to make arrangements for the care of other children and for other responsibilities. However, your caregiver may allow you to continue light activity.   Keep track of the number of pads you use each day, how often you change pads and how soaked (saturated) they are. Write this down.   Do not use tampons. Do not douche.   Do not have sexual intercourse or orgasms until approved by your physician.   Save any tissue that you pass for your caregiver to see.   Take medicine for cramps only with your caregiver's permission.   Do not take aspirin because it can make you bleed.  SEEK IMMEDIATE MEDICAL CARE IF:    You  experience severe cramps in your stomach, back or belly (abdomen).   You have an oral temperature above 102 F (38.9 C), not controlled by medicine.   You pass large clots or tissue.   Your bleeding increases or you become light-headed, weak or have fainting episodes.   You develop chills.   You are leaking or have a gush of fluid from your vagina.   You pass out while having a bowel movement. That may mean you have a ruptured tubal pregnancy.  Document Released: 02/07/2005 Document Revised: 04/19/2011 Document Reviewed: 08/19/2008  [employer] Patient Information 2012 [employer].

## 2011-11-05 NOTE — MAU Note (Signed)
Pt states after 3pm noted brownish vaginal d/c, then noted more, called MD office [facility]) and was told to come in to be evaluated. Denies pain at present. [facility] for ectopic in February, had right fallopian tube removed.

## 2011-11-05 NOTE — MAU Note (Signed)
C/o increased belching and has a rx for Pepcid but pt is not taking it as prescribed; on an antibiotic for her bladder infection;

## 2011-11-06 LAB — GC/CHLAMYDIA PROBE AMP, GENITAL: GC Probe Amp, Genital: NEGATIVE

## 2011-11-06 NOTE — MAU Provider Note (Signed)
Agree with above note.  Melissa [name] 11/06/2011 7:30 AM   

## 2011-11-07 ENCOUNTER — Telehealth: Payer: Self-pay | Admitting: Advanced Practice Midwife

## 2011-11-07 ENCOUNTER — Other Ambulatory Visit: Payer: Self-pay | Admitting: Advanced Practice Midwife

## 2011-11-07 ENCOUNTER — Encounter: Payer: Self-pay | Admitting: Physician Assistant

## 2011-11-07 DIAGNOSIS — B029 Zoster without complications: Secondary | ICD-10-CM

## 2011-11-07 MED ORDER — VALACYCLOVIR HCL 1 G PO TABS: 1000.0000 mg | ORAL_TABLET | Freq: Three times a day (TID) | ORAL | Status: AC

## 2011-11-07 NOTE — Telephone Encounter (Signed)
Pt receiving prenatal care at [facility] outpatient clinic. Called MAU RE: Shingles outbreak on ear. Has been Dx at [facility] ED in past per pt. No fever. Rash C/W previous episodes. Rx Valtrex. May call clinic to see if they would liek to see her sooner. Than scheduled visit in 1 week.   [place], [place] 11/07/2011 9:58 PM

## 2011-11-12 DIAGNOSIS — B029 Zoster without complications: Secondary | ICD-10-CM

## 2011-11-12 HISTORY — DX: Zoster without complications: B02.9

## 2011-11-14 ENCOUNTER — Ambulatory Visit (INDEPENDENT_AMBULATORY_CARE_PROVIDER_SITE_OTHER): Payer: Medicaid Other | Admitting: Physician Assistant

## 2011-11-14 VITALS — BP 121/78 | Temp 98.5°F | Wt 244.6 lb

## 2011-11-14 DIAGNOSIS — Z3490 Encounter for supervision of normal pregnancy, unspecified, unspecified trimester: Secondary | ICD-10-CM

## 2011-11-14 DIAGNOSIS — IMO0002 Reserved for concepts with insufficient information to code with codable children: Secondary | ICD-10-CM

## 2011-11-14 LAB — POCT URINALYSIS DIP (DEVICE)
Glucose, UA: NEGATIVE mg/dL
Specific Gravity, Urine: 1.025 (ref 1.005–1.030)
Urobilinogen, UA: 0.2 mg/dL (ref 0.0–1.0)

## 2011-11-14 NOTE — Progress Notes (Signed)
Pulse 86 Patient reports lower pelvic pressure

## 2011-11-14 NOTE — Progress Notes (Signed)
No complaints. Anticipatory guidance.

## 2011-11-14 NOTE — Patient Instructions (Signed)
Pregnancy - Second Trimester The second trimester of pregnancy (3 to 6 months) is a period of rapid growth for you and your baby. At the end of the sixth month, your baby is about 9 inches long and weighs 1 1/2 pounds. You will begin to feel the baby move between 18 and 20 weeks of the pregnancy. This is called quickening. Weight gain is faster. A clear fluid (colostrum) may leak out of your breasts. You may feel small contractions of the womb (uterus). This is known as false labor or Braxton-Hicks contractions. This is like a practice for labor when the baby is ready to be born. Usually, the problems with morning sickness have usually passed by the end of your first trimester. Some women develop small dark blotches (called cholasma, mask of pregnancy) on their face that usually goes away after the baby is born. Exposure to the sun makes the blotches worse. Acne may also develop in some pregnant women and pregnant women who have acne, may find that it goes away. PRENATAL EXAMS  Blood work may continue to be done during prenatal exams. These tests are done to check on your health and the probable health of your baby. Blood work is used to follow your blood levels (hemoglobin). Anemia (low hemoglobin) is common during pregnancy. Iron and vitamins are given to help prevent this. You will also be checked for diabetes between 24 and 28 weeks of the pregnancy. Some of the previous blood tests may be repeated.   The size of the uterus is measured during each visit. This is to make sure that the baby is continuing to grow properly according to the dates of the pregnancy.   Your blood pressure is checked every prenatal visit. This is to make sure you are not getting toxemia.   Your urine is checked to make sure you do not have an infection, diabetes or protein in the urine.   Your weight is checked often to make sure gains are happening at the suggested rate. This is to ensure that both you and your baby are  growing normally.   Sometimes, an ultrasound is performed to confirm the proper growth and development of the baby. This is a test which bounces harmless sound waves off the baby so your caregiver can more accurately determine due dates.  Sometimes, a specialized test is done on the amniotic fluid surrounding the baby. This test is called an amniocentesis. The amniotic fluid is obtained by sticking a needle into the belly (abdomen). This is done to check the chromosomes in instances where there is a concern about possible genetic problems with the baby. It is also sometimes done near the end of pregnancy if an early delivery is required. In this case, it is done to help make sure the baby's lungs are mature enough for the baby to live outside of the womb. CHANGES OCCURING IN THE SECOND TRIMESTER OF PREGNANCY Your body goes through many changes during pregnancy. They vary from person to person. Talk to your caregiver about changes you notice that you are concerned about.  During the second trimester, you will likely have an increase in your appetite. It is normal to have cravings for certain foods. This varies from person to person and pregnancy to pregnancy.   Your lower abdomen will begin to bulge.   You may have to urinate more often because the uterus and baby are pressing on your bladder. It is also common to get more bladder infections during pregnancy (  pain with urination). You can help this by drinking lots of fluids and emptying your bladder before and after intercourse.   You may begin to get stretch marks on your hips, abdomen, and breasts. These are normal changes in the body during pregnancy. There are no exercises or medications to take that prevent this change.   You may begin to develop swollen and bulging veins (varicose veins) in your legs. Wearing support hose, elevating your feet for 15 minutes, 3 to 4 times a day and limiting salt in your diet helps lessen the problem.    Heartburn may develop as the uterus grows and pushes up against the stomach. Antacids recommended by your caregiver helps with this problem. Also, eating smaller meals 4 to 5 times a day helps.   Constipation can be treated with a stool softener or adding bulk to your diet. Drinking lots of fluids, vegetables, fruits, and whole grains are helpful.   Exercising is also helpful. If you have been very active up until your pregnancy, most of these activities can be continued during your pregnancy. If you have been less active, it is helpful to start an exercise program such as walking.   Hemorrhoids (varicose veins in the rectum) may develop at the end of the second trimester. Warm sitz baths and hemorrhoid cream recommended by your caregiver helps hemorrhoid problems.   Backaches may develop during this time of your pregnancy. Avoid heavy lifting, wear low heal shoes and practice good posture to help with backache problems.   Some pregnant women develop tingling and numbness of their hand and fingers because of swelling and tightening of ligaments in the wrist (carpel tunnel syndrome). This goes away after the baby is born.   As your breasts enlarge, you may have to get a bigger bra. Get a comfortable, cotton, support bra. Do not get a nursing bra until the last month of the pregnancy if you will be nursing the baby.   You may get a dark line from your belly button to the pubic area called the linea nigra.   You may develop rosy cheeks because of increase blood flow to the face.   You may develop spider looking lines of the face, neck, arms and chest. These go away after the baby is born.  HOME CARE INSTRUCTIONS   It is extremely important to avoid all smoking, herbs, alcohol, and unprescribed drugs during your pregnancy. These chemicals affect the formation and growth of the baby. Avoid these chemicals throughout the pregnancy to ensure the delivery of a healthy infant.   Most of your home  care instructions are the same as suggested for the first trimester of your pregnancy. Keep your caregiver's appointments. Follow your caregiver's instructions regarding medication use, exercise and diet.   During pregnancy, you are providing food for you and your baby. Continue to eat regular, well-balanced meals. Choose foods such as meat, fish, milk and other low fat dairy products, vegetables, fruits, and whole-grain breads and cereals. Your caregiver will tell you of the ideal weight gain.   A physical sexual relationship may be continued up until near the end of pregnancy if there are no other problems. Problems could include early (premature) leaking of amniotic fluid from the membranes, vaginal bleeding, abdominal pain, or other medical or pregnancy problems.   Exercise regularly if there are no restrictions. Check with your caregiver if you are unsure of the safety of some of your exercises. The greatest weight gain will occur in the   last 2 trimesters of pregnancy. Exercise will help you:   Control your weight.   Get you in shape for labor and delivery.   Lose weight after you have the baby.   Wear a good support or jogging bra for breast tenderness during pregnancy. This may help if worn during sleep. Pads or tissues may be used in the bra if you are leaking colostrum.   Do not use hot tubs, steam rooms or saunas throughout the pregnancy.   Wear your seat belt at all times when driving. This protects you and your baby if you are in an accident.   Avoid raw meat, uncooked cheese, cat litter boxes and soil used by cats. These carry germs that can cause birth defects in the baby.   The second trimester is also a good time to visit your dentist for your dental health if this has not been done yet. Getting your teeth cleaned is OK. Use a soft toothbrush. Brush gently during pregnancy.   It is easier to loose urine during pregnancy. Tightening up and strengthening the pelvic muscles will  help with this problem. Practice stopping your urination while you are going to the bathroom. These are the same muscles you need to strengthen. It is also the muscles you would use as if you were trying to stop from passing gas. You can practice tightening these muscles up 10 times a set and repeating this about 3 times per day. Once you know what muscles to tighten up, do not perform these exercises during urination. It is more likely to contribute to an infection by backing up the urine.   Ask for help if you have financial, counseling or nutritional needs during pregnancy. Your caregiver will be able to offer counseling for these needs as well as refer you for other special needs.   Your skin may become oily. If so, wash your face with mild soap, use non-greasy moisturizer and oil or cream based makeup.  MEDICATIONS AND DRUG USE IN PREGNANCY  Take prenatal vitamins as directed. The vitamin should contain 1 milligram of folic acid. Keep all vitamins out of reach of children. Only a couple vitamins or tablets containing iron may be fatal to a baby or young child when ingested.   Avoid use of all medications, including herbs, over-the-counter medications, not prescribed or suggested by your caregiver. Only take over-the-counter or prescription medicines for pain, discomfort, or fever as directed by your caregiver. Do not use aspirin.   Let your caregiver also know about herbs you may be using.   Alcohol is related to a number of birth defects. This includes fetal alcohol syndrome. All alcohol, in any form, should be avoided completely. Smoking will cause low birth rate and premature babies.   Street or illegal drugs are very harmful to the baby. They are absolutely forbidden. A baby born to an addicted mother will be addicted at birth. The baby will go through the same withdrawal an adult does.  SEEK MEDICAL CARE IF:  You have any concerns or worries during your pregnancy. It is better to call with  your questions if you feel they cannot wait, rather than worry about them. SEEK IMMEDIATE MEDICAL CARE IF:   An unexplained oral temperature above 102 F (38.9 C) develops, or as your caregiver suggests.   You have leaking of fluid from the vagina (birth canal). If leaking membranes are suspected, take your temperature and tell your caregiver of this when you call.   There   is vaginal spotting, bleeding, or passing clots. Tell your caregiver of the amount and how many pads are used. Light spotting in pregnancy is common, especially following intercourse.   You develop a bad smelling vaginal discharge with a change in the color from clear to white.   You continue to feel sick to your stomach (nauseated) and have no relief from remedies suggested. You vomit blood or coffee ground-like materials.   You lose more than 2 pounds of weight or gain more than 2 pounds of weight over 1 week, or as suggested by your caregiver.   You notice swelling of your face, hands, feet, or legs.   You get exposed to German measles and have never had them.   You are exposed to fifth disease or chickenpox.   You develop belly (abdominal) pain. Round ligament discomfort is a common non-cancerous (benign) cause of abdominal pain in pregnancy. Your caregiver still must evaluate you.   You develop a bad headache that does not go away.   You develop fever, diarrhea, pain with urination, or shortness of breath.   You develop visual problems, blurry, or double vision.   You fall or are in a car accident or any kind of trauma.   There is mental or physical violence at home.  Document Released: 04/24/2001 Document Revised: 04/19/2011 Document Reviewed: 10/27/2008 [employer] Patient Information 2012 [employer]. 

## 2011-11-20 ENCOUNTER — Telehealth: Payer: Self-pay | Admitting: *Deleted

## 2011-11-20 MED ORDER — ONDANSETRON HCL 4 MG PO TABS: 4.0000 mg | ORAL_TABLET | Freq: Three times a day (TID) | ORAL | Status: AC | PRN

## 2011-11-20 NOTE — Telephone Encounter (Signed)
Pt called earlier today requesting Rx for Zofran to treat her nausea.  I told pt I will call her back after speaking with MD on call.  I obtained Rx order from Dr. Erin Fulling and returned call to pt.  She may pick up her Rx later today.  If medicaid requires pre-auth of this med, she will need to call back to the clinic for alternate Rx for phenergan.  Pt voiced understanding.

## 2011-11-26 ENCOUNTER — Telehealth: Payer: Self-pay | Admitting: *Deleted

## 2011-11-26 NOTE — Telephone Encounter (Signed)
Pt left message stating that she is constipated and the medication she was told to use is not working.  I returned pt's call and was told by a gentleman that she was not there.  I tried pt's work number and the line was not answered.

## 2011-11-28 NOTE — Telephone Encounter (Signed)
Called pt's home number and left a message that I am trying again to speak with her regarding her message from 7/15.  If she still has questions or wishes to speak to a nurse please leave a new message.

## 2011-12-04 ENCOUNTER — Inpatient Hospital Stay (HOSPITAL_COMMUNITY)
Admission: AD | Admit: 2011-12-04 | Discharge: 2011-12-04 | Disposition: A | Discharge status: Home / Self Care | Payer: Medicaid Other | Source: Ambulatory Visit | Attending: Obstetrics & Gynecology | Admitting: Obstetrics & Gynecology

## 2011-12-04 ENCOUNTER — Encounter (HOSPITAL_COMMUNITY): Payer: Self-pay

## 2011-12-04 DIAGNOSIS — O99891 Other specified diseases and conditions complicating pregnancy: Secondary | ICD-10-CM | POA: Insufficient documentation

## 2011-12-04 DIAGNOSIS — Z3490 Encounter for supervision of normal pregnancy, unspecified, unspecified trimester: Secondary | ICD-10-CM

## 2011-12-04 DIAGNOSIS — Z3480 Encounter for supervision of other normal pregnancy, unspecified trimester: Secondary | ICD-10-CM

## 2011-12-04 DIAGNOSIS — O210 Mild hyperemesis gravidarum: Secondary | ICD-10-CM | POA: Insufficient documentation

## 2011-12-04 DIAGNOSIS — K219 Gastro-esophageal reflux disease without esophagitis: Secondary | ICD-10-CM | POA: Insufficient documentation

## 2011-12-04 DIAGNOSIS — O219 Vomiting of pregnancy, unspecified: Secondary | ICD-10-CM

## 2011-12-04 HISTORY — DX: Zoster without complications: B02.9

## 2011-12-04 LAB — URINALYSIS, ROUTINE W REFLEX MICROSCOPIC
Glucose, UA: NEGATIVE mg/dL
Leukocytes, UA: NEGATIVE
Protein, ur: NEGATIVE mg/dL
Urobilinogen, UA: 0.2 mg/dL (ref 0.0–1.0)

## 2011-12-04 MED ORDER — PROMETHAZINE HCL 25 MG PO TABS
25.0000 mg | ORAL_TABLET | ORAL | Status: AC
  Administered 2011-12-04: 25 mg via ORAL
  Filled 2011-12-04: qty 1

## 2011-12-04 MED ORDER — PROMETHAZINE HCL 12.5 MG PO TABS: 12.5000 mg | ORAL_TABLET | Freq: Four times a day (QID) | ORAL | Status: DC | PRN

## 2011-12-04 NOTE — MAU Note (Signed)
Pt reports for last few days when she eats she vomits, pt reports she is on valtrex for shingles. Indicates dime size place on rt face. Area is scabbed. States when she tries to take her meds she vomits. Urgency but no dysuria.

## 2011-12-04 NOTE — MAU Provider Note (Signed)
Melissa [name] [id number] @[redacted]w[redacted]d  by LMP Chief Complaint  Patient presents with  . Emesis     First Provider Initiated Contact with Patient 12/04/11 0341      SUBJECTIVE  HPI: Pt presents to MAU with nausea and vomiting which she has had throughout pregnancy, but has increased since she started taking Valtrex for recent episode of shingles.  She has difficulty taking Valtrex tablets and gags and often vomits the pill.  She also reports frequent constipation and heartburn.  She denies abdominal cramping/contractions, vaginal bleeding, vaginal itching/burning, urinary symptoms, h/a, dizziness, or fever/chills.    Past Medical History  Diagnosis Date  . Abnormal Pap smear 08/30/2008    LGSIL  . Infertility     Secondary PCOS  . PCOS (polycystic ovarian syndrome)   . Chlamydia   . Fibroid   . Shingles outbreak 11/2011   Past Surgical History  Procedure Date  . Wisdom tooth extraction   . Right ear surgery   . Laparoscopy 07/05/2011    Procedure: LAPAROSCOPY OPERATIVE;  Surgeon: Melissa Newcomer, MD;  Location: [facility];  Service: Gynecology;  Laterality: N/A;  . Ear tube removal   . Dilation and curettage of uterus 06/2011    ectopic   History   Social History  . Marital Status: Married    Spouse Name: N/A    Number of Children: N/A  . Years of Education: N/A   Occupational History  . Not on file.   Social History Main Topics  . Smoking status: Never Smoker   . Smokeless tobacco: Never Used  . Alcohol Use: No  . Drug Use: No  . Sexually Active: Yes    Birth Control/ Protection: None   Other Topics Concern  . Not on file   Social History Narrative  . No narrative on file   No current facility-administered medications on file prior to encounter.   Current Outpatient Prescriptions on File Prior to Encounter  Medication Sig Dispense Refill  . acetaminophen (TYLENOL) 325 MG suppository Place 325 mg rectally every 4 (four) hours as needed. headache      .  famotidine (PEPCID) 20 MG tablet Take 1 tablet (20 mg total) by mouth 2 (two) times daily.  60 tablet  3  . Prenatal Vit-Fe Psac Cmplx-FA (PRENATAL MULTIVITAMIN) 60-1 MG tablet Take 1 tablet by mouth daily.        . promethazine (PHENERGAN) 12.5 MG tablet Take 1 tablet (12.5 mg total) by mouth every 6 (six) hours as needed for nausea.  30 tablet  0  . DISCONTD: diphenhydrAMINE (BENADRYL) 12.5 MG/5ML liquid Take 12.5 mg by mouth once.       Allergies  Allergen Reactions  . Spinach Itching    ROS: Pertinent items in HPI  OBJECTIVE Blood pressure 114/72, pulse 96, temperature 98.2 F (36.8 C), temperature source Oral, resp. rate 20, height 5\' 4"  (1.626 m), weight 110.678 kg (244 lb), last menstrual period 08/17/2011, SpO2 100.00%, unknown if currently breastfeeding.  GENERAL: Well-developed, well-nourished female in no acute distress.  HEENT: Normocephalic, good dentition HEART: normal rate RESP: normal effort ABDOMEN: Soft, nontender EXTREMITIES: Nontender, no edema NEURO: Alert and oriented   LAB RESULTS  Results for orders placed during the hospital encounter of 12/04/11 (from the past 24 hour(s))  URINALYSIS, ROUTINE W REFLEX MICROSCOPIC     Status: Abnormal   Collection Time   12/04/11  1:58 AM      Component Value Range   Color, Urine YELLOW  YELLOW  APPearance CLEAR  CLEAR   Specific Gravity, Urine >1.030 (*) 1.005 - 1.030   pH 5.5  5.0 - 8.0   Glucose, UA NEGATIVE  NEGATIVE mg/dL   Hgb urine dipstick NEGATIVE  NEGATIVE   Bilirubin Urine NEGATIVE  NEGATIVE   Ketones, ur NEGATIVE  NEGATIVE mg/dL   Protein, ur NEGATIVE  NEGATIVE mg/dL   Urobilinogen, UA 0.2  0.0 - 1.0 mg/dL   Nitrite NEGATIVE  NEGATIVE   Leukocytes, UA NEGATIVE  NEGATIVE    ASSESSMENT  1. Supervision of normal pregnancy   2. Gastroesophageal reflux   3. Nausea/vomiting in pregnancy     PLAN Phenergan 25 mg PO x1 dose in MAU D/C home Phenergan 12.5 mg PO Q6 hours PRN n/v Take Pepcid BID as  prescribed Take Phenergan 20 minutes before taking Valtrex tablet Crush Valtrex if needed, but better to keep pill intact Increase PO fluids F/U with [facility] Return to MAU as needed  Medication List  As of 12/04/2011  4:11 AM   STOP taking these medications         cetirizine 10 MG tablet      zolpidem 5 MG tablet         TAKE these medications         acetaminophen 325 MG suppository   Commonly known as: TYLENOL   Place 325 mg rectally every 4 (four) hours as needed. headache      famotidine 20 MG tablet   Commonly known as: PEPCID   Take 1 tablet (20 mg total) by mouth 2 (two) times daily.      ondansetron 4 MG tablet   Commonly known as: ZOFRAN   Take 4 mg by mouth every 8 (eight) hours as needed.      prenatal multivitamin 60-1 MG tablet   Take 1 tablet by mouth daily.      promethazine 12.5 MG tablet   Commonly known as: PHENERGAN   Take 1 tablet (12.5 mg total) by mouth every 6 (six) hours as needed for nausea.      valACYclovir 1000 MG tablet   Commonly known as: VALTREX   Take 1,000 mg by mouth 3 (three) times daily.            [name], Melissa Palmisano 12/04/2011 4:11 AM

## 2011-12-12 ENCOUNTER — Ambulatory Visit (INDEPENDENT_AMBULATORY_CARE_PROVIDER_SITE_OTHER): Payer: Medicaid Other | Admitting: Family

## 2011-12-12 VITALS — BP 121/85 | Temp 98.4°F | Wt 243.9 lb

## 2011-12-12 DIAGNOSIS — IMO0002 Reserved for concepts with insufficient information to code with codable children: Secondary | ICD-10-CM

## 2011-12-12 DIAGNOSIS — Z3490 Encounter for supervision of normal pregnancy, unspecified, unspecified trimester: Secondary | ICD-10-CM

## 2011-12-12 LAB — POCT URINALYSIS DIP (DEVICE)
Glucose, UA: NEGATIVE mg/dL
Leukocytes, UA: NEGATIVE
Nitrite: NEGATIVE
Urobilinogen, UA: 1 mg/dL (ref 0.0–1.0)

## 2011-12-12 MED ORDER — ENSURE HEALTHY MOM PO LIQD: 1.0000 | Freq: Two times a day (BID) | ORAL | Status: DC

## 2011-12-12 NOTE — Progress Notes (Signed)
Reports vomiting has improved since taking Reglan and phenergan starting on 12/04/11; pt reports difficult to eat anything, would like to try Ensure for nutritional consumption; need ideas for obtaining nutrients with current inability to store food now as needed; schedule appointment for nutritionalist; schedule OB ultrasound for 18-[redacted] wks gestation.

## 2011-12-12 NOTE — Progress Notes (Signed)
US- Ob detail scheduled 12/25/11 @ 1400

## 2011-12-12 NOTE — Progress Notes (Signed)
Pulse: 99 Has a lot of pressure. Unable to keep anything down, not feeling well.  Feels very lightheaded.

## 2011-12-17 NOTE — MAU Provider Note (Signed)
Medical Screening exam and patient care preformed by advanced practice provider.  Agree with the above management.  

## 2011-12-21 ENCOUNTER — Encounter: Payer: Self-pay | Admitting: *Deleted

## 2011-12-21 DIAGNOSIS — IMO0002 Reserved for concepts with insufficient information to code with codable children: Secondary | ICD-10-CM | POA: Insufficient documentation

## 2011-12-25 ENCOUNTER — Ambulatory Visit (HOSPITAL_COMMUNITY)
Admission: RE | Admit: 2011-12-25 | Discharge: 2011-12-25 | Disposition: A | Discharge status: Home / Self Care | Payer: Medicaid Other | Source: Ambulatory Visit | Attending: Family | Admitting: Family

## 2011-12-25 DIAGNOSIS — O09219 Supervision of pregnancy with history of pre-term labor, unspecified trimester: Secondary | ICD-10-CM | POA: Insufficient documentation

## 2011-12-25 DIAGNOSIS — Z363 Encounter for antenatal screening for malformations: Secondary | ICD-10-CM | POA: Insufficient documentation

## 2011-12-25 DIAGNOSIS — O358XX0 Maternal care for other (suspected) fetal abnormality and damage, not applicable or unspecified: Secondary | ICD-10-CM | POA: Insufficient documentation

## 2011-12-25 DIAGNOSIS — Z3490 Encounter for supervision of normal pregnancy, unspecified, unspecified trimester: Secondary | ICD-10-CM

## 2011-12-25 DIAGNOSIS — O3410 Maternal care for benign tumor of corpus uteri, unspecified trimester: Secondary | ICD-10-CM | POA: Insufficient documentation

## 2011-12-25 DIAGNOSIS — Z1389 Encounter for screening for other disorder: Secondary | ICD-10-CM | POA: Insufficient documentation

## 2011-12-26 ENCOUNTER — Encounter: Payer: Medicaid Other | Admitting: Family

## 2011-12-27 ENCOUNTER — Encounter: Payer: Self-pay | Admitting: Family

## 2011-12-31 ENCOUNTER — Telehealth (HOSPITAL_COMMUNITY): Payer: Self-pay

## 2011-12-31 ENCOUNTER — Other Ambulatory Visit: Payer: Self-pay | Admitting: Obstetrics and Gynecology

## 2011-12-31 DIAGNOSIS — R1110 Vomiting, unspecified: Secondary | ICD-10-CM

## 2011-12-31 DIAGNOSIS — O210 Mild hyperemesis gravidarum: Secondary | ICD-10-CM

## 2011-12-31 MED ORDER — PROMETHAZINE HCL 12.5 MG PO TABS: 12.5000 mg | ORAL_TABLET | Freq: Four times a day (QID) | ORAL | Status: DC | PRN

## 2011-12-31 MED ORDER — ONDANSETRON HCL 4 MG PO TABS: 4.0000 mg | ORAL_TABLET | Freq: Three times a day (TID) | ORAL | Status: DC | PRN

## 2011-12-31 NOTE — Telephone Encounter (Signed)
Patient called requesting for phenergan 12.5mg  RF for nausea. Approved by Dr. Macon Large. Rx ordered and sent. Patient notified and satisfied.

## 2012-01-02 ENCOUNTER — Ambulatory Visit (INDEPENDENT_AMBULATORY_CARE_PROVIDER_SITE_OTHER): Payer: Medicaid Other | Admitting: Physician Assistant

## 2012-01-02 VITALS — BP 125/80 | Temp 97.8°F | Wt 241.4 lb

## 2012-01-02 DIAGNOSIS — Z3480 Encounter for supervision of other normal pregnancy, unspecified trimester: Secondary | ICD-10-CM

## 2012-01-02 DIAGNOSIS — N760 Acute vaginitis: Secondary | ICD-10-CM

## 2012-01-02 DIAGNOSIS — IMO0002 Reserved for concepts with insufficient information to code with codable children: Secondary | ICD-10-CM

## 2012-01-02 LAB — POCT URINALYSIS DIP (DEVICE)
Bilirubin Urine: NEGATIVE
Glucose, UA: NEGATIVE mg/dL
Leukocytes, UA: NEGATIVE
Nitrite: NEGATIVE

## 2012-01-02 MED ORDER — RANITIDINE HCL 150 MG PO TABS: 150.0000 mg | ORAL_TABLET | Freq: Two times a day (BID) | ORAL | Status: DC

## 2012-01-02 NOTE — Progress Notes (Signed)
Increased GERD. Will change pepcid to zantac. + FM. NL female anatomy. Poor visualization of heart. Will re-check at 24 weeks.

## 2012-01-02 NOTE — Progress Notes (Signed)
P= 103 Thick, white discharge

## 2012-01-02 NOTE — Patient Instructions (Signed)
Pregnancy - Second Trimester The second trimester of pregnancy (3 to 6 months) is a period of rapid growth for you and your baby. At the end of the sixth month, your baby is about 9 inches long and weighs 1 1/2 pounds. You will begin to feel the baby move between 18 and 20 weeks of the pregnancy. This is called quickening. Weight gain is faster. A clear fluid (colostrum) may leak out of your breasts. You may feel small contractions of the womb (uterus). This is known as false labor or Braxton-Hicks contractions. This is like a practice for labor when the baby is ready to be born. Usually, the problems with morning sickness have usually passed by the end of your first trimester. Some women develop small dark blotches (called cholasma, mask of pregnancy) on their face that usually goes away after the baby is born. Exposure to the sun makes the blotches worse. Acne may also develop in some pregnant women and pregnant women who have acne, may find that it goes away. PRENATAL EXAMS  Blood work may continue to be done during prenatal exams. These tests are done to check on your health and the probable health of your baby. Blood work is used to follow your blood levels (hemoglobin). Anemia (low hemoglobin) is common during pregnancy. Iron and vitamins are given to help prevent this. You will also be checked for diabetes between 24 and 28 weeks of the pregnancy. Some of the previous blood tests may be repeated.   The size of the uterus is measured during each visit. This is to make sure that the baby is continuing to grow properly according to the dates of the pregnancy.   Your blood pressure is checked every prenatal visit. This is to make sure you are not getting toxemia.   Your urine is checked to make sure you do not have an infection, diabetes or protein in the urine.   Your weight is checked often to make sure gains are happening at the suggested rate. This is to ensure that both you and your baby are  growing normally.   Sometimes, an ultrasound is performed to confirm the proper growth and development of the baby. This is a test which bounces harmless sound waves off the baby so your caregiver can more accurately determine due dates.  Sometimes, a specialized test is done on the amniotic fluid surrounding the baby. This test is called an amniocentesis. The amniotic fluid is obtained by sticking a needle into the belly (abdomen). This is done to check the chromosomes in instances where there is a concern about possible genetic problems with the baby. It is also sometimes done near the end of pregnancy if an early delivery is required. In this case, it is done to help make sure the baby's lungs are mature enough for the baby to live outside of the womb. CHANGES OCCURING IN THE SECOND TRIMESTER OF PREGNANCY Your body goes through many changes during pregnancy. They vary from person to person. Talk to your caregiver about changes you notice that you are concerned about.  During the second trimester, you will likely have an increase in your appetite. It is normal to have cravings for certain foods. This varies from person to person and pregnancy to pregnancy.   Your lower abdomen will begin to bulge.   You may have to urinate more often because the uterus and baby are pressing on your bladder. It is also common to get more bladder infections during pregnancy (  pain with urination). You can help this by drinking lots of fluids and emptying your bladder before and after intercourse.   You may begin to get stretch marks on your hips, abdomen, and breasts. These are normal changes in the body during pregnancy. There are no exercises or medications to take that prevent this change.   You may begin to develop swollen and bulging veins (varicose veins) in your legs. Wearing support hose, elevating your feet for 15 minutes, 3 to 4 times a day and limiting salt in your diet helps lessen the problem.    Heartburn may develop as the uterus grows and pushes up against the stomach. Antacids recommended by your caregiver helps with this problem. Also, eating smaller meals 4 to 5 times a day helps.   Constipation can be treated with a stool softener or adding bulk to your diet. Drinking lots of fluids, vegetables, fruits, and whole grains are helpful.   Exercising is also helpful. If you have been very active up until your pregnancy, most of these activities can be continued during your pregnancy. If you have been less active, it is helpful to start an exercise program such as walking.   Hemorrhoids (varicose veins in the rectum) may develop at the end of the second trimester. Warm sitz baths and hemorrhoid cream recommended by your caregiver helps hemorrhoid problems.   Backaches may develop during this time of your pregnancy. Avoid heavy lifting, wear low heal shoes and practice good posture to help with backache problems.   Some pregnant women develop tingling and numbness of their hand and fingers because of swelling and tightening of ligaments in the wrist (carpel tunnel syndrome). This goes away after the baby is born.   As your breasts enlarge, you may have to get a bigger bra. Get a comfortable, cotton, support bra. Do not get a nursing bra until the last month of the pregnancy if you will be nursing the baby.   You may get a dark line from your belly button to the pubic area called the linea nigra.   You may develop rosy cheeks because of increase blood flow to the face.   You may develop spider looking lines of the face, neck, arms and chest. These go away after the baby is born.  HOME CARE INSTRUCTIONS   It is extremely important to avoid all smoking, herbs, alcohol, and unprescribed drugs during your pregnancy. These chemicals affect the formation and growth of the baby. Avoid these chemicals throughout the pregnancy to ensure the delivery of a healthy infant.   Most of your home  care instructions are the same as suggested for the first trimester of your pregnancy. Keep your caregiver's appointments. Follow your caregiver's instructions regarding medication use, exercise and diet.   During pregnancy, you are providing food for you and your baby. Continue to eat regular, well-balanced meals. Choose foods such as meat, fish, milk and other low fat dairy products, vegetables, fruits, and whole-grain breads and cereals. Your caregiver will tell you of the ideal weight gain.   A physical sexual relationship may be continued up until near the end of pregnancy if there are no other problems. Problems could include early (premature) leaking of amniotic fluid from the membranes, vaginal bleeding, abdominal pain, or other medical or pregnancy problems.   Exercise regularly if there are no restrictions. Check with your caregiver if you are unsure of the safety of some of your exercises. The greatest weight gain will occur in the   last 2 trimesters of pregnancy. Exercise will help you:   Control your weight.   Get you in shape for labor and delivery.   Lose weight after you have the baby.   Wear a good support or jogging bra for breast tenderness during pregnancy. This may help if worn during sleep. Pads or tissues may be used in the bra if you are leaking colostrum.   Do not use hot tubs, steam rooms or saunas throughout the pregnancy.   Wear your seat belt at all times when driving. This protects you and your baby if you are in an accident.   Avoid raw meat, uncooked cheese, cat litter boxes and soil used by cats. These carry germs that can cause birth defects in the baby.   The second trimester is also a good time to visit your dentist for your dental health if this has not been done yet. Getting your teeth cleaned is OK. Use a soft toothbrush. Brush gently during pregnancy.   It is easier to loose urine during pregnancy. Tightening up and strengthening the pelvic muscles will  help with this problem. Practice stopping your urination while you are going to the bathroom. These are the same muscles you need to strengthen. It is also the muscles you would use as if you were trying to stop from passing gas. You can practice tightening these muscles up 10 times a set and repeating this about 3 times per day. Once you know what muscles to tighten up, do not perform these exercises during urination. It is more likely to contribute to an infection by backing up the urine.   Ask for help if you have financial, counseling or nutritional needs during pregnancy. Your caregiver will be able to offer counseling for these needs as well as refer you for other special needs.   Your skin may become oily. If so, wash your face with mild soap, use non-greasy moisturizer and oil or cream based makeup.  MEDICATIONS AND DRUG USE IN PREGNANCY  Take prenatal vitamins as directed. The vitamin should contain 1 milligram of folic acid. Keep all vitamins out of reach of children. Only a couple vitamins or tablets containing iron may be fatal to a baby or young child when ingested.   Avoid use of all medications, including herbs, over-the-counter medications, not prescribed or suggested by your caregiver. Only take over-the-counter or prescription medicines for pain, discomfort, or fever as directed by your caregiver. Do not use aspirin.   Let your caregiver also know about herbs you may be using.   Alcohol is related to a number of birth defects. This includes fetal alcohol syndrome. All alcohol, in any form, should be avoided completely. Smoking will cause low birth rate and premature babies.   Street or illegal drugs are very harmful to the baby. They are absolutely forbidden. A baby born to an addicted mother will be addicted at birth. The baby will go through the same withdrawal an adult does.  SEEK MEDICAL CARE IF:  You have any concerns or worries during your pregnancy. It is better to call with  your questions if you feel they cannot wait, rather than worry about them. SEEK IMMEDIATE MEDICAL CARE IF:   An unexplained oral temperature above 102 F (38.9 C) develops, or as your caregiver suggests.   You have leaking of fluid from the vagina (birth canal). If leaking membranes are suspected, take your temperature and tell your caregiver of this when you call.   There   is vaginal spotting, bleeding, or passing clots. Tell your caregiver of the amount and how many pads are used. Light spotting in pregnancy is common, especially following intercourse.   You develop a bad smelling vaginal discharge with a change in the color from clear to white.   You continue to feel sick to your stomach (nauseated) and have no relief from remedies suggested. You vomit blood or coffee ground-like materials.   You lose more than 2 pounds of weight or gain more than 2 pounds of weight over 1 week, or as suggested by your caregiver.   You notice swelling of your face, hands, feet, or legs.   You get exposed to German measles and have never had them.   You are exposed to fifth disease or chickenpox.   You develop belly (abdominal) pain. Round ligament discomfort is a common non-cancerous (benign) cause of abdominal pain in pregnancy. Your caregiver still must evaluate you.   You develop a bad headache that does not go away.   You develop fever, diarrhea, pain with urination, or shortness of breath.   You develop visual problems, blurry, or double vision.   You fall or are in a car accident or any kind of trauma.   There is mental or physical violence at home.  Document Released: 04/24/2001 Document Revised: 04/19/2011 Document Reviewed: 10/27/2008 [employer] Patient Information 2012 [employer]. 

## 2012-01-03 ENCOUNTER — Other Ambulatory Visit: Payer: Self-pay | Admitting: Physician Assistant

## 2012-01-03 DIAGNOSIS — N760 Acute vaginitis: Secondary | ICD-10-CM

## 2012-01-03 LAB — WET PREP, GENITAL: Trich, Wet Prep: NONE SEEN

## 2012-01-03 MED ORDER — TINIDAZOLE 500 MG PO TABS: 2.0000 g | ORAL_TABLET | Freq: Every day | ORAL | Status: AC

## 2012-01-03 MED ORDER — FLUCONAZOLE 150 MG PO TABS: 150.0000 mg | ORAL_TABLET | Freq: Once | ORAL | Status: AC

## 2012-01-09 ENCOUNTER — Encounter: Payer: Self-pay | Admitting: *Deleted

## 2012-01-09 ENCOUNTER — Encounter: Payer: Medicaid Other | Admitting: Obstetrics and Gynecology

## 2012-01-23 ENCOUNTER — Other Ambulatory Visit: Payer: Self-pay | Admitting: *Deleted

## 2012-01-23 DIAGNOSIS — Z3490 Encounter for supervision of normal pregnancy, unspecified, unspecified trimester: Secondary | ICD-10-CM

## 2012-01-23 DIAGNOSIS — IMO0002 Reserved for concepts with insufficient information to code with codable children: Secondary | ICD-10-CM

## 2012-01-23 DIAGNOSIS — K219 Gastro-esophageal reflux disease without esophagitis: Secondary | ICD-10-CM

## 2012-01-23 DIAGNOSIS — R1110 Vomiting, unspecified: Secondary | ICD-10-CM

## 2012-01-23 MED ORDER — PROMETHAZINE HCL 12.5 MG PO TABS: 12.5000 mg | ORAL_TABLET | Freq: Four times a day (QID) | ORAL | Status: DC | PRN

## 2012-01-23 NOTE — Telephone Encounter (Signed)
Pt called and requested a prescription for Promethazine. I spoke with Dr. Macon Large who gave verbal order. Medicine sent to her pharmacy. Message left for pt with her husband that rx was sent to pharmacy. She gave permission that I could tell her husband when I called back.

## 2012-01-30 ENCOUNTER — Encounter: Payer: Medicaid Other | Admitting: Family Medicine

## 2012-02-06 ENCOUNTER — Ambulatory Visit (INDEPENDENT_AMBULATORY_CARE_PROVIDER_SITE_OTHER): Payer: Medicaid Other | Admitting: Family Medicine

## 2012-02-06 ENCOUNTER — Encounter: Payer: Self-pay | Admitting: *Deleted

## 2012-02-06 VITALS — BP 116/73 | Temp 99.0°F | Wt 238.4 lb

## 2012-02-06 DIAGNOSIS — Z3490 Encounter for supervision of normal pregnancy, unspecified, unspecified trimester: Secondary | ICD-10-CM

## 2012-02-06 DIAGNOSIS — O3410 Maternal care for benign tumor of corpus uteri, unspecified trimester: Secondary | ICD-10-CM

## 2012-02-06 LAB — POCT URINALYSIS DIP (DEVICE)
Leukocytes, UA: NEGATIVE
Nitrite: NEGATIVE
Protein, ur: 30 mg/dL — AB
pH: 6 (ref 5.0–8.0)

## 2012-02-06 NOTE — Progress Notes (Signed)
No concerns. Has lost weight this pregnancy and since last visit. Does have nausea/reflux controlled with zantac and promethazine, but is admittedly a picky eater. Not trying to lose weight. Has not been able to see nutritionist due to schedule conflict. Fetal survey normal but not all anatomy visualized. F/u sono ordered. Discussed healthy diet/snacks.

## 2012-02-06 NOTE — Patient Instructions (Signed)
Pregnancy - Second Trimester The second trimester of pregnancy (3 to 6 months) is a period of rapid growth for you and your baby. At the end of the sixth month, your baby is about 9 inches long and weighs 1 1/2 pounds. You will begin to feel the baby move between 18 and 20 weeks of the pregnancy. This is called quickening. Weight gain is faster. A clear fluid (colostrum) may leak out of your breasts. You may feel small contractions of the womb (uterus). This is known as false labor or Braxton-Hicks contractions. This is like a practice for labor when the baby is ready to be born. Usually, the problems with morning sickness have usually passed by the end of your first trimester. Some women develop small dark blotches (called cholasma, mask of pregnancy) on their face that usually goes away after the baby is born. Exposure to the sun makes the blotches worse. Acne may also develop in some pregnant women and pregnant women who have acne, may find that it goes away. PRENATAL EXAMS  Blood work may continue to be done during prenatal exams. These tests are done to check on your health and the probable health of your baby. Blood work is used to follow your blood levels (hemoglobin). Anemia (low hemoglobin) is common during pregnancy. Iron and vitamins are given to help prevent this. You will also be checked for diabetes between 24 and 28 weeks of the pregnancy. Some of the previous blood tests may be repeated.   The size of the uterus is measured during each visit. This is to make sure that the baby is continuing to grow properly according to the dates of the pregnancy.   Your blood pressure is checked every prenatal visit. This is to make sure you are not getting toxemia.   Your urine is checked to make sure you do not have an infection, diabetes or protein in the urine.   Your weight is checked often to make sure gains are happening at the suggested rate. This is to ensure that both you and your baby are  growing normally.   Sometimes, an ultrasound is performed to confirm the proper growth and development of the baby. This is a test which bounces harmless sound waves off the baby so your caregiver can more accurately determine due dates.  Sometimes, a specialized test is done on the amniotic fluid surrounding the baby. This test is called an amniocentesis. The amniotic fluid is obtained by sticking a needle into the belly (abdomen). This is done to check the chromosomes in instances where there is a concern about possible genetic problems with the baby. It is also sometimes done near the end of pregnancy if an early delivery is required. In this case, it is done to help make sure the baby's lungs are mature enough for the baby to live outside of the womb. CHANGES OCCURING IN THE SECOND TRIMESTER OF PREGNANCY Your body goes through many changes during pregnancy. They vary from person to person. Talk to your caregiver about changes you notice that you are concerned about.  During the second trimester, you will likely have an increase in your appetite. It is normal to have cravings for certain foods. This varies from person to person and pregnancy to pregnancy.   Your lower abdomen will begin to bulge.   You may have to urinate more often because the uterus and baby are pressing on your bladder. It is also common to get more bladder infections during pregnancy (  pain with urination). You can help this by drinking lots of fluids and emptying your bladder before and after intercourse.   You may begin to get stretch marks on your hips, abdomen, and breasts. These are normal changes in the body during pregnancy. There are no exercises or medications to take that prevent this change.   You may begin to develop swollen and bulging veins (varicose veins) in your legs. Wearing support hose, elevating your feet for 15 minutes, 3 to 4 times a day and limiting salt in your diet helps lessen the problem.    Heartburn may develop as the uterus grows and pushes up against the stomach. Antacids recommended by your caregiver helps with this problem. Also, eating smaller meals 4 to 5 times a day helps.   Constipation can be treated with a stool softener or adding bulk to your diet. Drinking lots of fluids, vegetables, fruits, and whole grains are helpful.   Exercising is also helpful. If you have been very active up until your pregnancy, most of these activities can be continued during your pregnancy. If you have been less active, it is helpful to start an exercise program such as walking.   Hemorrhoids (varicose veins in the rectum) may develop at the end of the second trimester. Warm sitz baths and hemorrhoid cream recommended by your caregiver helps hemorrhoid problems.   Backaches may develop during this time of your pregnancy. Avoid heavy lifting, wear low heal shoes and practice good posture to help with backache problems.   Some pregnant women develop tingling and numbness of their hand and fingers because of swelling and tightening of ligaments in the wrist (carpel tunnel syndrome). This goes away after the baby is born.   As your breasts enlarge, you may have to get a bigger bra. Get a comfortable, cotton, support bra. Do not get a nursing bra until the last month of the pregnancy if you will be nursing the baby.   You may get a dark line from your belly button to the pubic area called the linea nigra.   You may develop rosy cheeks because of increase blood flow to the face.   You may develop spider looking lines of the face, neck, arms and chest. These go away after the baby is born.  HOME CARE INSTRUCTIONS   It is extremely important to avoid all smoking, herbs, alcohol, and unprescribed drugs during your pregnancy. These chemicals affect the formation and growth of the baby. Avoid these chemicals throughout the pregnancy to ensure the delivery of a healthy infant.   Most of your home  care instructions are the same as suggested for the first trimester of your pregnancy. Keep your caregiver's appointments. Follow your caregiver's instructions regarding medication use, exercise and diet.   During pregnancy, you are providing food for you and your baby. Continue to eat regular, well-balanced meals. Choose foods such as meat, fish, milk and other low fat dairy products, vegetables, fruits, and whole-grain breads and cereals. Your caregiver will tell you of the ideal weight gain.   A physical sexual relationship may be continued up until near the end of pregnancy if there are no other problems. Problems could include early (premature) leaking of amniotic fluid from the membranes, vaginal bleeding, abdominal pain, or other medical or pregnancy problems.   Exercise regularly if there are no restrictions. Check with your caregiver if you are unsure of the safety of some of your exercises. The greatest weight gain will occur in the   last 2 trimesters of pregnancy. Exercise will help you:   Control your weight.   Get you in shape for labor and delivery.   Lose weight after you have the baby.   Wear a good support or jogging bra for breast tenderness during pregnancy. This may help if worn during sleep. Pads or tissues may be used in the bra if you are leaking colostrum.   Do not use hot tubs, steam rooms or saunas throughout the pregnancy.   Wear your seat belt at all times when driving. This protects you and your baby if you are in an accident.   Avoid raw meat, uncooked cheese, cat litter boxes and soil used by cats. These carry germs that can cause birth defects in the baby.   The second trimester is also a good time to visit your dentist for your dental health if this has not been done yet. Getting your teeth cleaned is OK. Use a soft toothbrush. Brush gently during pregnancy.   It is easier to loose urine during pregnancy. Tightening up and strengthening the pelvic muscles will  help with this problem. Practice stopping your urination while you are going to the bathroom. These are the same muscles you need to strengthen. It is also the muscles you would use as if you were trying to stop from passing gas. You can practice tightening these muscles up 10 times a set and repeating this about 3 times per day. Once you know what muscles to tighten up, do not perform these exercises during urination. It is more likely to contribute to an infection by backing up the urine.   Ask for help if you have financial, counseling or nutritional needs during pregnancy. Your caregiver will be able to offer counseling for these needs as well as refer you for other special needs.   Your skin may become oily. If so, wash your face with mild soap, use non-greasy moisturizer and oil or cream based makeup.  MEDICATIONS AND DRUG USE IN PREGNANCY  Take prenatal vitamins as directed. The vitamin should contain 1 milligram of folic acid. Keep all vitamins out of reach of children. Only a couple vitamins or tablets containing iron may be fatal to a baby or young child when ingested.   Avoid use of all medications, including herbs, over-the-counter medications, not prescribed or suggested by your caregiver. Only take over-the-counter or prescription medicines for pain, discomfort, or fever as directed by your caregiver. Do not use aspirin.   Let your caregiver also know about herbs you may be using.   Alcohol is related to a number of birth defects. This includes fetal alcohol syndrome. All alcohol, in any form, should be avoided completely. Smoking will cause low birth rate and premature babies.   Street or illegal drugs are very harmful to the baby. They are absolutely forbidden. A baby born to an addicted mother will be addicted at birth. The baby will go through the same withdrawal an adult does.  SEEK MEDICAL CARE IF:  You have any concerns or worries during your pregnancy. It is better to call with  your questions if you feel they cannot wait, rather than worry about them. SEEK IMMEDIATE MEDICAL CARE IF:   An unexplained oral temperature above 102 F (38.9 C) develops, or as your caregiver suggests.   You have leaking of fluid from the vagina (birth canal). If leaking membranes are suspected, take your temperature and tell your caregiver of this when you call.   There   is vaginal spotting, bleeding, or passing clots. Tell your caregiver of the amount and how many pads are used. Light spotting in pregnancy is common, especially following intercourse.   You develop a bad smelling vaginal discharge with a change in the color from clear to white.   You continue to feel sick to your stomach (nauseated) and have no relief from remedies suggested. You vomit blood or coffee ground-like materials.   You lose more than 2 pounds of weight or gain more than 2 pounds of weight over 1 week, or as suggested by your caregiver.   You notice swelling of your face, hands, feet, or legs.   You get exposed to German measles and have never had them.   You are exposed to fifth disease or chickenpox.   You develop belly (abdominal) pain. Round ligament discomfort is a common non-cancerous (benign) cause of abdominal pain in pregnancy. Your caregiver still must evaluate you.   You develop a bad headache that does not go away.   You develop fever, diarrhea, pain with urination, or shortness of breath.   You develop visual problems, blurry, or double vision.   You fall or are in a car accident or any kind of trauma.   There is mental or physical violence at home.  Document Released: 04/24/2001 Document Revised: 04/19/2011 Document Reviewed: 10/27/2008 [employer] Patient Information 2012 [employer]. 

## 2012-02-06 NOTE — Progress Notes (Signed)
Pulse 96 Patient reports some pelvic pressure and occasional "braxton hicks"  Patient needs refill on promethazine 12.5mg 

## 2012-02-13 ENCOUNTER — Telehealth: Payer: Self-pay | Admitting: *Deleted

## 2012-02-13 NOTE — Telephone Encounter (Signed)
Called and scheduled prenatal ob fu ultrasound as ordered for 02/18/12 at 9:15am. Rebbeca Paul and left a message that the appointment she requested was scheduled for 02/18/12 at 9:15 call [phone] if that time does not work in your schedule or call clinic if you have other questions.

## 2012-02-13 NOTE — Telephone Encounter (Signed)
Melissa [name] called nurse line 02/12/12 at 1:50pm and left a message stating she saw Dr Thad Ranger last Wednesday and didn't get to schedule her Ultrasound. Requests a call to schedule her [place].

## 2012-02-18 ENCOUNTER — Ambulatory Visit (HOSPITAL_COMMUNITY)
Admission: RE | Admit: 2012-02-18 | Discharge: 2012-02-18 | Disposition: A | Discharge status: Home / Self Care | Payer: Medicaid Other | Source: Ambulatory Visit | Attending: Family Medicine | Admitting: Family Medicine

## 2012-02-18 DIAGNOSIS — Z1389 Encounter for screening for other disorder: Secondary | ICD-10-CM | POA: Insufficient documentation

## 2012-02-18 DIAGNOSIS — O358XX0 Maternal care for other (suspected) fetal abnormality and damage, not applicable or unspecified: Secondary | ICD-10-CM | POA: Insufficient documentation

## 2012-02-18 DIAGNOSIS — O3410 Maternal care for benign tumor of corpus uteri, unspecified trimester: Secondary | ICD-10-CM | POA: Insufficient documentation

## 2012-02-18 DIAGNOSIS — Z363 Encounter for antenatal screening for malformations: Secondary | ICD-10-CM | POA: Insufficient documentation

## 2012-02-18 DIAGNOSIS — Z3490 Encounter for supervision of normal pregnancy, unspecified, unspecified trimester: Secondary | ICD-10-CM

## 2012-03-05 ENCOUNTER — Encounter: Payer: Medicaid Other | Admitting: Family Medicine

## 2012-03-12 ENCOUNTER — Ambulatory Visit (INDEPENDENT_AMBULATORY_CARE_PROVIDER_SITE_OTHER): Payer: Medicaid Other | Admitting: Family Medicine

## 2012-03-12 VITALS — BP 124/67 | Temp 98.6°F | Wt 237.4 lb

## 2012-03-12 DIAGNOSIS — Z3490 Encounter for supervision of normal pregnancy, unspecified, unspecified trimester: Secondary | ICD-10-CM

## 2012-03-12 DIAGNOSIS — O3410 Maternal care for benign tumor of corpus uteri, unspecified trimester: Secondary | ICD-10-CM

## 2012-03-12 LAB — POCT URINALYSIS DIP (DEVICE)
Protein, ur: 30 mg/dL — AB
Specific Gravity, Urine: >=1.03 (ref 1.005–1.030)
Urobilinogen, UA: 1 mg/dL (ref 0.0–1.0)
pH: 6 (ref 5.0–8.0)

## 2012-03-12 LAB — CBC
MCHC: 35 g/dL (ref 30.0–36.0)
MCV: 86.1 fL (ref 78.0–100.0)
Platelets: 312 10*3/uL (ref 150–400)
RDW: 13.4 % (ref 11.5–15.5)
WBC: 6.6 10*3/uL (ref 4.0–10.5)

## 2012-03-12 NOTE — Patient Instructions (Signed)
Pregnancy - Third Trimester  The third trimester of pregnancy (the last 3 months) is a period of the most rapid growth for you and your baby. The baby approaches a length of 20 inches and a weight of 6 to 10 pounds. The baby is adding on fat and getting ready for life outside your body. While inside, babies have periods of sleeping and waking, suck their thumbs, and hiccups. You can often feel small contractions of the uterus. This is false labor. It is also called Braxton-Hicks contractions. This is like a practice for labor. The usual problems in this stage of pregnancy include more difficulty breathing, swelling of the hands and feet from water retention, and having to urinate more often because of the uterus and baby pressing on your bladder.   PRENATAL EXAMS  · Blood work may continue to be done during prenatal exams. These tests are done to check on your health and the probable health of your baby. Blood work is used to follow your blood levels (hemoglobin). Anemia (low hemoglobin) is common during pregnancy. Iron and vitamins are given to help prevent this. You may also continue to be checked for diabetes. Some of the past blood tests may be done again.  · The size of the uterus is measured during each visit. This makes sure your baby is growing properly according to your pregnancy dates.  · Your blood pressure is checked every prenatal visit. This is to make sure you are not getting toxemia.  · Your urine is checked every prenatal visit for infection, diabetes and protein.  · Your weight is checked at each visit. This is done to make sure gains are happening at the suggested rate and that you and your baby are growing normally.  · Sometimes, an ultrasound is performed to confirm the position and the proper growth and development of the baby. This is a test done that bounces harmless sound waves off the baby so your caregiver can more accurately determine due dates.  · Discuss the type of pain medication and  anesthesia you will have during your labor and delivery.  · Discuss the possibility and anesthesia if a Cesarean Section might be necessary.  · Inform your caregiver if there is any mental or physical violence at home.  Sometimes, a specialized non-stress test, contraction stress test and biophysical profile are done to make sure the baby is not having a problem. Checking the amniotic fluid surrounding the baby is called an amniocentesis. The amniotic fluid is removed by sticking a needle into the belly (abdomen). This is sometimes done near the end of pregnancy if an early delivery is required. In this case, it is done to help make sure the baby's lungs are mature enough for the baby to live outside of the womb. If the lungs are not mature and it is unsafe to deliver the baby, an injection of cortisone medication is given to the mother 1 to 2 days before the delivery. This helps the baby's lungs mature and makes it safer to deliver the baby.  CHANGES OCCURING IN THE THIRD TRIMESTER OF PREGNANCY  Your body goes through many changes during pregnancy. They vary from person to person. Talk to your caregiver about changes you notice and are concerned about.  · During the last trimester, you have probably had an increase in your appetite. It is normal to have cravings for certain foods. This varies from person to person and pregnancy to pregnancy.  · You may begin to   get stretch marks on your hips, abdomen, and breasts. These are normal changes in the body during pregnancy. There are no exercises or medications to take which prevent this change.  · Constipation may be treated with a stool softener or adding bulk to your diet. Drinking lots of fluids, fiber in vegetables, fruits, and whole grains are helpful.  · Exercising is also helpful. If you have been very active up until your pregnancy, most of these activities can be continued during your pregnancy. If you have been less active, it is helpful to start an exercise  program such as walking. Consult your caregiver before starting exercise programs.  · Avoid all smoking, alcohol, un-prescribed drugs, herbs and "street drugs" during your pregnancy. These chemicals affect the formation and growth of the baby. Avoid chemicals throughout the pregnancy to ensure the delivery of a healthy infant.  · Backache, varicose veins and hemorrhoids may develop or get worse.  · You will tire more easily in the third trimester, which is normal.  · The baby's movements may be stronger and more often.  · You may become short of breath easily.  · Your belly button may stick out.  · A yellow discharge may leak from your breasts called colostrum.  · You may have a bloody mucus discharge. This usually occurs a few days to a week before labor begins.  HOME CARE INSTRUCTIONS   · Keep your caregiver's appointments. Follow your caregiver's instructions regarding medication use, exercise, and diet.  · During pregnancy, you are providing food for you and your baby. Continue to eat regular, well-balanced meals. Choose foods such as meat, fish, milk and other low fat dairy products, vegetables, fruits, and whole-grain breads and cereals. Your caregiver will tell you of the ideal weight gain.  · A physical sexual relationship may be continued throughout pregnancy if there are no other problems such as early (premature) leaking of amniotic fluid from the membranes, vaginal bleeding, or belly (abdominal) pain.  · Exercise regularly if there are no restrictions. Check with your caregiver if you are unsure of the safety of your exercises. Greater weight gain will occur in the last 2 trimesters of pregnancy. Exercising helps:  · Control your weight.  · Get you in shape for labor and delivery.  · You lose weight after you deliver.  · Rest a lot with legs elevated, or as needed for leg cramps or low back pain.  · Wear a good support or jogging bra for breast tenderness during pregnancy. This may help if worn during  sleep. Pads or tissues may be used in the bra if you are leaking colostrum.  · Do not use hot tubs, steam rooms, or saunas.  · Wear your seat belt when driving. This protects you and your baby if you are in an accident.  · Avoid raw meat, cat litter boxes and soil used by cats. These carry germs that can cause birth defects in the baby.  · It is easier to loose urine during pregnancy. Tightening up and strengthening the pelvic muscles will help with this problem. You can practice stopping your urination while you are going to the bathroom. These are the same muscles you need to strengthen. It is also the muscles you would use if you were trying to stop from passing gas. You can practice tightening these muscles up 10 times a set and repeating this about 3 times per day. Once you know what muscles to tighten up, do not perform these   exercises during urination. It is more likely to cause an infection by backing up the urine.  · Ask for help if you have financial, counseling or nutritional needs during pregnancy. Your caregiver will be able to offer counseling for these needs as well as refer you for other special needs.  · Make a list of emergency phone numbers and have them available.  · Plan on getting help from family or friends when you go home from the hospital.  · Make a trial run to the hospital.  · Take prenatal classes with the father to understand, practice and ask questions about the labor and delivery.  · Prepare the baby's room/nursery.  · Do not travel out of the city unless it is absolutely necessary and with the advice of your caregiver.  · Wear only low or no heal shoes to have better balance and prevent falling.  MEDICATIONS AND DRUG USE IN PREGNANCY  · Take prenatal vitamins as directed. The vitamin should contain 1 milligram of folic acid. Keep all vitamins out of reach of children. Only a couple vitamins or tablets containing iron may be fatal to a baby or young child when ingested.  · Avoid use  of all medications, including herbs, over-the-counter medications, not prescribed or suggested by your caregiver. Only take over-the-counter or prescription medicines for pain, discomfort, or fever as directed by your caregiver. Do not use aspirin, ibuprofen (Motrin®, Advil®, Nuprin®) or naproxen (Aleve®) unless OK'd by your caregiver.  · Let your caregiver also know about herbs you may be using.  · Alcohol is related to a number of birth defects. This includes fetal alcohol syndrome. All alcohol, in any form, should be avoided completely. Smoking will cause low birth rate and premature babies.  · Street/illegal drugs are very harmful to the baby. They are absolutely forbidden. A baby born to an addicted mother will be addicted at birth. The baby will go through the same withdrawal an adult does.  SEEK MEDICAL CARE IF:  You have any concerns or worries during your pregnancy. It is better to call with your questions if you feel they cannot wait, rather than worry about them.  DECISIONS ABOUT CIRCUMCISION  You may or may not know the sex of your baby. If you know your baby is a boy, it may be time to think about circumcision. Circumcision is the removal of the foreskin of the penis. This is the skin that covers the sensitive end of the penis. There is no proven medical need for this. Often this decision is made on what is popular at the time or based upon religious beliefs and social issues. You can discuss these issues with your caregiver or pediatrician.  SEEK IMMEDIATE MEDICAL CARE IF:   · An unexplained oral temperature above 102° F (38.9° C) develops, or as your caregiver suggests.  · You have leaking of fluid from the vagina (birth canal). If leaking membranes are suspected, take your temperature and tell your caregiver of this when you call.  · There is vaginal spotting, bleeding or passing clots. Tell your caregiver of the amount and how many pads are used.  · You develop a bad smelling vaginal discharge with  a change in the color from clear to white.  · You develop vomiting that lasts more than 24 hours.  · You develop chills or fever.  · You develop shortness of breath.  · You develop burning on urination.  · You loose more than 2 pounds of weight   or gain more than 2 pounds of weight or as suggested by your caregiver.  · You notice sudden swelling of your face, hands, and feet or legs.  · You develop belly (abdominal) pain. Round ligament discomfort is a common non-cancerous (benign) cause of abdominal pain in pregnancy. Your caregiver still must evaluate you.  · You develop a severe headache that does not go away.  · You develop visual problems, blurred or double vision.  · If you have not felt your baby move for more than 1 hour. If you think the baby is not moving as much as usual, eat something with sugar in it and lie down on your left side for an hour. The baby should move at least 4 to 5 times per hour. Call right away if your baby moves less than that.  · You fall, are in a car accident or any kind of trauma.  · There is mental or physical violence at home.  Document Released: 04/24/2001 Document Revised: 07/23/2011 Document Reviewed: 10/27/2008  [employer]® Patient Information ©2013 [employer].

## 2012-03-12 NOTE — Progress Notes (Signed)
CSW called by clinic staff to see patient for social stressors.  SW has met with patient before and patient remembers this.  Her situation has not changed.  She states that she and her husband are still living in a hotel that they pay $600.00 per month for.  She states it is roach infested and last week the refrigerator stopped working.  She reports that they have the means to get a new refrigerator, but she and her husband have been arguing about whether to rent one or buy one.  She states they have been eating canned foods and peanut butter and jelly sandwiches.  CSW asked numerous times throughout the lengthy conversation if her daughter is eating regularly.  She always said yes.  She states that her daughter goes to school regularly and that her husband is still working as a Electrical engineer.  Patient states she cannot eat canned foods because this baby does not like it.  CSW gave her a list of places where they can have a hot meal.  She states they can't get there.  CSW offered bus passes and she states she gets sick on the bus.  CSW offered to get patient food from a food bank if they needed more food, but that it would of course not be fresh food.  She was not interested.  CSW asked about natural resources, remembering that patient's family is not supportive.  Patient states she used to attend church, but stopped when she no longer had transportation.  CSW thinks that people from the church would be willing to help transport if she asked.  She states she did this for a while, but that she wants to leave after the service and she didn't like having to wait on her ride to be ready to leave.  Patient had a reason why everything CSW suggested would not be an option for her.  CSW asked if she had followed through with counseling since the last time CSW spoke with her.  She states she met with [facility], and that she thought it was very beneficial.  She states transportation was an issue  again.  CSW suggested Medicaid transportation and the possibility of the counselor meeting patient at the hotel where she lives.  She states she does not want the counselor to see it.  CSW stated understanding, but encouraged patient to call counselor to discuss.  CSW thinks patient suffers from Anxiety and suggests that patient apply for Disability since she states every time she tries to get a job, she has racing heart and dizziness.  She says Disability will take too long.  CSW understands this and validates this feeling, but reminded patient that she will never have the chance of being approved if she never applies.  Patient eventually told CSW that her husband's family lives in [place] and are more than willing to help them with anything they need if they move up there.  Patient states both she and her husband want to.  Despite numerous questioning by CSW as to why patient and her family have not made plans to do this yet, since it sounds like everyone is in agreement that it would better their situation, patient did not seem to have a response.  She states her husband wants to save money, although it does not appear he is capable of this in their current situation and that she thinks they will be moving at the end of December.  CSW reminded patient that  traveling 2 weeks before her due date is not advisable and that they will need some time to get settled before having the baby.  Although CSW is concerned about patient's current living conditions for the two adults and 84 year old child, CSW thinks they are completely unsuitable for an infant and will consider involving Child Protective Services if patient has not made other living arrangements when this baby is born.  Although we did not discuss CPS at this time, patient agrees that current living environment is not suitable for a newborn.  CSW is unsure how to assist patient and her family as patient is not interested in anything CSW can offer her.  CSW stated  this to patient and asked if she could identify anything that CSW can specifically help her with.  She gave a sign of relief and stated that it just felt better to talk.  CSW asked her if she would use a meal ticket to get something to eat today if CSW provided.  She agreed.  CSW again encouraged her to call counselor to resume therapy.

## 2012-03-12 NOTE — Progress Notes (Signed)
Pulse- 100 Pt stated that she fell on her left side yesterday.  Called and spoke to a nurse "upstairs".  Has c/o of lightheadedness, contractions, pain, and fatigue.  Pt will schedule 28 week lab for Tues

## 2012-03-12 NOTE — Progress Notes (Signed)
Feeling dizzy/lightheaded today. Better after Ensure. Will check CBC today. Not eating well due to financial issues. Lives in hotel, no refrigerator. Will refer to social work. Has lost 9 lbs over course of pregnancy. CBG okay today (111). Passed out yesterday and fell on left side. No abdominal pain/tenderness/contractions/vaginal bleeding. Baby moving well. Brief possible decel to 120s on doppler auscultation. Will send to [facility] for NST. Pt not sure if her ride will wait while she goes to [facility]. Advised pt that it is important to assure that the baby is doing well. 1 hour GTT next week as pt not feeling well today and does not want to drink glucose for test.

## 2012-03-19 ENCOUNTER — Other Ambulatory Visit: Payer: Medicaid Other

## 2012-03-19 DIAGNOSIS — Z3490 Encounter for supervision of normal pregnancy, unspecified, unspecified trimester: Secondary | ICD-10-CM

## 2012-03-20 LAB — GLUCOSE TOLERANCE, 1 HOUR (50G) W/O FASTING: Glucose, 1 Hour GTT: 112 mg/dL (ref 70–140)

## 2012-03-26 ENCOUNTER — Ambulatory Visit (INDEPENDENT_AMBULATORY_CARE_PROVIDER_SITE_OTHER): Payer: Medicaid Other | Admitting: Family

## 2012-03-26 VITALS — BP 107/72 | Temp 98.3°F | Wt 236.0 lb

## 2012-03-26 DIAGNOSIS — R6889 Other general symptoms and signs: Secondary | ICD-10-CM

## 2012-03-26 DIAGNOSIS — IMO0002 Reserved for concepts with insufficient information to code with codable children: Secondary | ICD-10-CM

## 2012-03-26 LAB — POCT URINALYSIS DIP (DEVICE)
Glucose, UA: NEGATIVE mg/dL
Hgb urine dipstick: NEGATIVE
Nitrite: NEGATIVE
Protein, ur: 30 mg/dL — AB
Protein, ur: 30 mg/dL — AB
Specific Gravity, Urine: >=1.03 (ref 1.005–1.030)
Specific Gravity, Urine: >=1.03 (ref 1.005–1.030)
Urobilinogen, UA: 1 mg/dL (ref 0.0–1.0)
Urobilinogen, UA: 1 mg/dL (ref 0.0–1.0)
pH: 6 (ref 5.0–8.0)
pH: 6 (ref 5.0–8.0)

## 2012-03-26 NOTE — Progress Notes (Signed)
P = 108 Pain/pressure in lower abdomen

## 2012-03-27 ENCOUNTER — Ambulatory Visit (HOSPITAL_COMMUNITY)
Admission: RE | Admit: 2012-03-27 | Discharge: 2012-03-27 | Disposition: A | Discharge status: Home / Self Care | Payer: Medicaid Other | Source: Ambulatory Visit | Attending: Family | Admitting: Family

## 2012-03-27 DIAGNOSIS — IMO0002 Reserved for concepts with insufficient information to code with codable children: Secondary | ICD-10-CM

## 2012-03-27 DIAGNOSIS — Z3689 Encounter for other specified antenatal screening: Secondary | ICD-10-CM | POA: Insufficient documentation

## 2012-04-03 ENCOUNTER — Telehealth: Payer: Self-pay | Admitting: *Deleted

## 2012-04-03 DIAGNOSIS — Z3490 Encounter for supervision of normal pregnancy, unspecified, unspecified trimester: Secondary | ICD-10-CM

## 2012-04-03 DIAGNOSIS — IMO0002 Reserved for concepts with insufficient information to code with codable children: Secondary | ICD-10-CM

## 2012-04-03 DIAGNOSIS — K219 Gastro-esophageal reflux disease without esophagitis: Secondary | ICD-10-CM

## 2012-04-03 DIAGNOSIS — O3410 Maternal care for benign tumor of corpus uteri, unspecified trimester: Secondary | ICD-10-CM

## 2012-04-03 NOTE — Telephone Encounter (Signed)
Pt called and stated that she is having severe left sided pain. It hurts constantly and wakes her up at night. She denies any bleeding or discharge. Stated that baby is moving well. I advised that she come to [facility] to be evaluated. Pt states that she doesn't know if she can make it tonight because her husband has to go to work and she has her daughter at home. She states that she will come tomorrow morning. I told her that if she develops worsening pain or bleeding she should call 911 and go to [facility]. Pt agrees with plan.

## 2012-04-07 ENCOUNTER — Telehealth: Payer: Self-pay | Admitting: *Deleted

## 2012-04-07 DIAGNOSIS — K219 Gastro-esophageal reflux disease without esophagitis: Secondary | ICD-10-CM

## 2012-04-07 DIAGNOSIS — IMO0002 Reserved for concepts with insufficient information to code with codable children: Secondary | ICD-10-CM

## 2012-04-07 DIAGNOSIS — O3410 Maternal care for benign tumor of corpus uteri, unspecified trimester: Secondary | ICD-10-CM

## 2012-04-07 DIAGNOSIS — Z3490 Encounter for supervision of normal pregnancy, unspecified, unspecified trimester: Secondary | ICD-10-CM

## 2012-04-07 MED ORDER — PANTOPRAZOLE SODIUM 40 MG PO TBEC: 40.0000 mg | DELAYED_RELEASE_TABLET | Freq: Every day | ORAL | Status: DC

## 2012-04-07 NOTE — Telephone Encounter (Signed)
I spoke with Dr. Debroah Loop to see if patient could have refill. He reviewed her current meds and problem list and decided that she should discontinue Zantac and Promethazine and start on Protonix 40mg  daily. I informed patient and she agrees with plan. She is scheduled to followup here on Wednesday.

## 2012-04-07 NOTE — Telephone Encounter (Signed)
Pt is requesting a refill on her Phenergan. I checked with pharmacy to see if she had any refills, it was last filled on 10/28 with no remaining refills.  Would like a refill.

## 2012-04-09 ENCOUNTER — Ambulatory Visit (INDEPENDENT_AMBULATORY_CARE_PROVIDER_SITE_OTHER): Payer: Medicaid Other | Admitting: Obstetrics & Gynecology

## 2012-04-09 VITALS — BP 100/60 | Temp 96.8°F | Wt 235.0 lb

## 2012-04-09 DIAGNOSIS — Z3490 Encounter for supervision of normal pregnancy, unspecified, unspecified trimester: Secondary | ICD-10-CM

## 2012-04-09 DIAGNOSIS — IMO0002 Reserved for concepts with insufficient information to code with codable children: Secondary | ICD-10-CM

## 2012-04-09 DIAGNOSIS — O3410 Maternal care for benign tumor of corpus uteri, unspecified trimester: Secondary | ICD-10-CM

## 2012-04-09 LAB — POCT URINALYSIS DIP (DEVICE)
Hgb urine dipstick: NEGATIVE
Leukocytes, UA: NEGATIVE
Nitrite: NEGATIVE
Protein, ur: 100 mg/dL — AB
Urobilinogen, UA: 1 mg/dL (ref 0.0–1.0)
pH: 6 (ref 5.0–8.0)

## 2012-04-09 NOTE — Progress Notes (Signed)
Pulse  80 C/o pain on left groin; no pressure. Vaginal d/c as scant watery; no odor, no itch.

## 2012-04-09 NOTE — Progress Notes (Signed)
No other complaints or concerns.  Fetal movement and labor precautions reviewed.  Pelvic cultures next visit. 

## 2012-04-09 NOTE — Patient Instructions (Signed)
Return to clinic for any obstetric concerns or go to [facility] for evaluation  

## 2012-04-21 ENCOUNTER — Other Ambulatory Visit: Payer: Self-pay

## 2012-04-21 DIAGNOSIS — R1110 Vomiting, unspecified: Secondary | ICD-10-CM

## 2012-04-21 MED ORDER — RANITIDINE HCL 150 MG PO TABS: 150.0000 mg | ORAL_TABLET | Freq: Two times a day (BID) | ORAL | Status: DC

## 2012-04-21 MED ORDER — PROMETHAZINE HCL 12.5 MG PO TABS: 12.5000 mg | ORAL_TABLET | Freq: Four times a day (QID) | ORAL | Status: DC | PRN

## 2012-04-21 NOTE — Telephone Encounter (Signed)
Pt called and c/o having pain in her pelvic area and she stated "what exactly does it mean that he's moving down".  I explained to the pt that that she is approaching her due date the baby is preparing himself to progressively move down towards the vaginal canal.  With that she may experience the exact thing that she is experiencing pelvic pain, vaginal pressure.  Pt stated "ok, my husband was saying the exact same thing".  Pt then wanted to know if she could get a change in her protonix and going back to her zantac that she was on and she wanted to a refill on her phenergan.  Per Dr. Macon Large pt can have a refill on her phenergan and get her zantac back.

## 2012-04-23 ENCOUNTER — Other Ambulatory Visit (HOSPITAL_COMMUNITY)
Admission: RE | Admit: 2012-04-23 | Discharge: 2012-04-23 | Disposition: A | Discharge status: Home / Self Care | Payer: Medicaid Other | Source: Ambulatory Visit | Attending: Obstetrics & Gynecology | Admitting: Obstetrics & Gynecology

## 2012-04-23 ENCOUNTER — Ambulatory Visit (INDEPENDENT_AMBULATORY_CARE_PROVIDER_SITE_OTHER): Payer: Medicaid Other | Admitting: Obstetrics & Gynecology

## 2012-04-23 VITALS — BP 119/80 | Temp 98.0°F | Wt 234.0 lb

## 2012-04-23 DIAGNOSIS — Z3480 Encounter for supervision of other normal pregnancy, unspecified trimester: Secondary | ICD-10-CM

## 2012-04-23 DIAGNOSIS — O3410 Maternal care for benign tumor of corpus uteri, unspecified trimester: Secondary | ICD-10-CM

## 2012-04-23 DIAGNOSIS — Z113 Encounter for screening for infections with a predominantly sexual mode of transmission: Secondary | ICD-10-CM | POA: Insufficient documentation

## 2012-04-23 LAB — OB RESULTS CONSOLE GC/CHLAMYDIA
Chlamydia: NEGATIVE
Gonorrhea: NEGATIVE

## 2012-04-23 LAB — POCT URINALYSIS DIP (DEVICE)
Leukocytes, UA: NEGATIVE
Protein, ur: NEGATIVE mg/dL
Urobilinogen, UA: 0.2 mg/dL (ref 0.0–1.0)
pH: 5.5 (ref 5.0–8.0)

## 2012-04-23 NOTE — Patient Instructions (Addendum)
Pain Relief During Labor and Delivery All women are different when it comes to having pain. The amount of pain that a woman experiences during labor and delivery depends on their pain tolerance, contraction strength and the baby's size and the position.  There are many ways to prepare and deal with the pain. These ways include:  Taking prenatal classes to learn about labor and delivery. The more informed you are, the less anxious and afraid you may be, which can help lessen the pain.  Taking medication or anesthetics during labor and delivery.  Women preferring natural childbirth learn breathing and relaxation techniques and how to relax between contractions to control their pain. They may take a shower or bath, have their partner massage or place an ice pack on their back, or may just want to change positions during labor. METHODS AVAILABLE TO CONTROL PAIN:  Medications may be given by injection into the muscles or vein (intravenously) to ease the pain. These medications can be given along with another medication (barbiturates) to relax you. These medications keep you awake. There can be minor side effects such as nausea, trouble concentrating, becoming sleepy and lowering the heart rate of the baby. However, the dose given will not seriously affect the baby.  Paracervical block is given during labor by injecting numbing medication into the right and left sides of the cervix and vagina. This is found to relieve most of the labor pains. It may have to be given more than once.  A local anesthetic may be injected under the skin, on the outside of the vagina, in the perineal area to perform a small cut (episiotomy). This cut is done to prevent tearing of the vagina at the time of delivery.  Pudendal anesthesia is an injection given deep through the perineum into the area to the pudendal nerves. This numbs the outside of the vagina and perineum at the time of delivery.  Epidural anesthesia is an  injection of numbing medication into the area of the lower back and spinal column. This anesthetic is injected on the outside covering of the spinal cord. An epidural numbs the lower part of the body. It can be given continuously in small doses through a tube for prolonged anesthesia. It can also be used to do a Cesarean section. You are able to move your legs, but not allowed to walk. Epidural is a very common and popular form of anesthesia during labor and delivery. It should only be given by a trained and experienced anesthesiologist or anesthetist. Side effects may occur, such as:  Headache.  Drop in blood pressure.  Backache.  Dizziness.  Difficulty breathing if it affects your chest muscles.  Shivering.  Slowing down the contractions.  Require forceps or vacuum extraction delivery.  Higher incidence of Cesarean Section.  Rarely it can cause a convulsion.  Spinal block anesthesia is an injection of numbing medication into the lower back and spinal column area. However, this anesthetic is injected on the inside of the covering of the spinal cord. Spinal block anesthesia is only given once. Therefore, it is best to be given just before delivering the baby. It can also be used for Cesarean sections. The side effects are the same as the epidural side effects. It should only be given by a trained anesthesiologist or anesthetist. Whether a person takes medication or not during labor and delivery will depend on the needs of each individual and situation. Do not be afraid to ask for pain medication if you need  it. There is no reason to be ashamed, embarrassed or feel that you failed yourself if you take medication. You should discuss how you and your caregiver plan to control your pain during your prenatal visits. It may also be a good idea to talk to an anesthesiologist before your due date. Document Released: 08/16/2008 Document Revised: 07/23/2011 Document Reviewed: 08/16/2008 [facility]  Patient Information 2013 [place], [place]. Natural Childbirth Natural childbirth is going through labor and delivery without any drugs to relieve pain. You also do not use fetal monitors, have a cesarean delivery, or get a sugical cut to enlarge the vaginal opening (episiotomy). With the help of a birthing professional (midwife), you will direct your own labor and delivery as you choose. Many women chose natural childbirth because they feel more in control and in touch with their labor and delivery. They are also concerned about the medications affecting themselves and the baby. Pregnant women with a high risk pregnancy should not attempt natural childbirth. It is better to deliver the infant in a hospital if an emergency situation arises. Sometimes, the caregiver has to intervene for the health and safety of the mother and infant. TWO TECHNIQUES FOR NATURAL CHILDBIRTH:   The Lamaze method. This method teaches women that having a baby is normal, healthy, and natural. It also teaches the mother to take a neutral position regarding pain medication and anesthesia and to make an informed decision if and when it is right for them.  The Erven Colla (also called husband coached birth). This method teaches the father to be the birth coach and stresses a natural approach. It also encourages exercise and a balanced diet with good nutrition. The exercises teach relaxation and deep breathing techniques. However, there are also classes to prepare the parents for an emergency situation that may occur. METHODS OF DEALING WITH LABOR PAIN AND DELIVERY:  Meditation.  Yoga.  Hypnosis.  Acupuncture.  Massage.  Changing positions (walking, rocking, showering, leaning on birth balls).  Lying in warm water or a jacuzzi.  Find an activity that keeps your mind off of the labor pain.  Listen to soft music.  Visual imagery (focus on a particular object). BEFORE GOING INTO LABOR  Be sure you and your  spouse/partner are in agreement to have natural childbirth.  Decide if your caregiver or a midwife will deliver your baby.  Decide if you will have your baby in the hospital, birthing center, or at home.  If you have children, make plans to have someone to take care of them when you go to the hospital.  Know the distance and the time it takes to go to the delivery center. Make a dry run to be sure.  Have a bag packed with a night gown, bathrobe, and toiletries ready to take when you go into labor.  Keep phone numbers of your family and friends handy if you need to call someone when you go into labor.  Your spouse or partner should go to all the teaching classes.  Talk with your caregiver about the possibility of a medical emergency and what will happen if that occurs. ADVANTAGES OF NATURAL CHILDBIRTH  You are in control of your labor and delivery.  It is safe.  There are no medications or anesthetics that may affect you and the fetus.  There are no invasive procedures such as an episiotomy.  You and your partner will work together, which can increase your bond.  Meditation, yoga, massage, and breathing exercises can be learned while  pregnant and help you when you are in labor and at delivery.  In most delivery centers, the family and friends can be involved in the labor and delivery process. DISADVANTAGES OF NATURAL CHILDBIRTH  You will experience pain during your labor and delivery.  The methods of helping relieve your labor pains may not work for you.  You may feel embarrassed, disappointed, and like a failure if you decide to change your mind during labor and not have natural childbirth. AFTER THE DELIVERY  You will be very tired.  You will be uncomfortable because of your uterus contracting. You will feel soreness around the vagina.  You may feel cold and shaky.This is a natural reaction.  You will be excited, overwhelmed, accomplished, and proud to be a  mother. HOME CARE INSTRUCTIONS   Follow the advice and instructions of your caregiver.  Follow the instructions of your natural childbirth instructor (Lamaze or Bradley Method). Document Released: 04/12/2008 Document Revised: 07/23/2011 Document Reviewed: 04/12/2008 [facility] Patient Information 2013 [place], [place].

## 2012-04-23 NOTE — Progress Notes (Signed)
Pulse 85 C/o pelvic pain and pressure. Vaginal d/c stated as clear, thin mucous with itch, no odor.

## 2012-04-23 NOTE — Progress Notes (Signed)
Pt with no complaints.  Did not eat today got light headed after exam.  Undecided on post partum contraception.  Plans to breastfeed.

## 2012-04-26 LAB — CULTURE, BETA STREP (GROUP B ONLY)

## 2012-04-30 ENCOUNTER — Ambulatory Visit (INDEPENDENT_AMBULATORY_CARE_PROVIDER_SITE_OTHER): Payer: Medicaid Other | Admitting: Advanced Practice Midwife

## 2012-04-30 VITALS — BP 100/74 | Temp 96.2°F | Wt 236.7 lb

## 2012-04-30 DIAGNOSIS — O3410 Maternal care for benign tumor of corpus uteri, unspecified trimester: Secondary | ICD-10-CM

## 2012-04-30 DIAGNOSIS — Z3490 Encounter for supervision of normal pregnancy, unspecified, unspecified trimester: Secondary | ICD-10-CM

## 2012-04-30 LAB — POCT URINALYSIS DIP (DEVICE)
Bilirubin Urine: NEGATIVE
Glucose, UA: NEGATIVE mg/dL
Hgb urine dipstick: NEGATIVE
Ketones, ur: NEGATIVE mg/dL
Nitrite: NEGATIVE
Protein, ur: 30 mg/dL — AB
Specific Gravity, Urine: 1.025 (ref 1.005–1.030)
Urobilinogen, UA: 0.2 mg/dL (ref 0.0–1.0)
pH: 6.5 (ref 5.0–8.0)

## 2012-04-30 NOTE — Patient Instructions (Signed)
Pregnancy - Third Trimester  The third trimester of pregnancy (the last 3 months) is a period of the most rapid growth for you and your baby. The baby approaches a length of 20 inches and a weight of 6 to 10 pounds. The baby is adding on fat and getting ready for life outside your body. While inside, babies have periods of sleeping and waking, suck their thumbs, and hiccups. You can often feel small contractions of the uterus. This is false labor. It is also called Braxton-Hicks contractions. This is like a practice for labor. The usual problems in this stage of pregnancy include more difficulty breathing, swelling of the hands and feet from water retention, and having to urinate more often because of the uterus and baby pressing on your bladder.   PRENATAL EXAMS  · Blood work may continue to be done during prenatal exams. These tests are done to check on your health and the probable health of your baby. Blood work is used to follow your blood levels (hemoglobin). Anemia (low hemoglobin) is common during pregnancy. Iron and vitamins are given to help prevent this. You may also continue to be checked for diabetes. Some of the past blood tests may be done again.  · The size of the uterus is measured during each visit. This makes sure your baby is growing properly according to your pregnancy dates.  · Your blood pressure is checked every prenatal visit. This is to make sure you are not getting toxemia.  · Your urine is checked every prenatal visit for infection, diabetes and protein.  · Your weight is checked at each visit. This is done to make sure gains are happening at the suggested rate and that you and your baby are growing normally.  · Sometimes, an ultrasound is performed to confirm the position and the proper growth and development of the baby. This is a test done that bounces harmless sound waves off the baby so your caregiver can more accurately determine due dates.  · Discuss the type of pain medication and  anesthesia you will have during your labor and delivery.  · Discuss the possibility and anesthesia if a Cesarean Section might be necessary.  · Inform your caregiver if there is any mental or physical violence at home.  Sometimes, a specialized non-stress test, contraction stress test and biophysical profile are done to make sure the baby is not having a problem. Checking the amniotic fluid surrounding the baby is called an amniocentesis. The amniotic fluid is removed by sticking a needle into the belly (abdomen). This is sometimes done near the end of pregnancy if an early delivery is required. In this case, it is done to help make sure the baby's lungs are mature enough for the baby to live outside of the womb. If the lungs are not mature and it is unsafe to deliver the baby, an injection of cortisone medication is given to the mother 1 to 2 days before the delivery. This helps the baby's lungs mature and makes it safer to deliver the baby.  CHANGES OCCURING IN THE THIRD TRIMESTER OF PREGNANCY  Your body goes through many changes during pregnancy. They vary from person to person. Talk to your caregiver about changes you notice and are concerned about.  · During the last trimester, you have probably had an increase in your appetite. It is normal to have cravings for certain foods. This varies from person to person and pregnancy to pregnancy.  · You may begin to   get stretch marks on your hips, abdomen, and breasts. These are normal changes in the body during pregnancy. There are no exercises or medications to take which prevent this change.  · Constipation may be treated with a stool softener or adding bulk to your diet. Drinking lots of fluids, fiber in vegetables, fruits, and whole grains are helpful.  · Exercising is also helpful. If you have been very active up until your pregnancy, most of these activities can be continued during your pregnancy. If you have been less active, it is helpful to start an exercise  program such as walking. Consult your caregiver before starting exercise programs.  · Avoid all smoking, alcohol, un-prescribed drugs, herbs and "street drugs" during your pregnancy. These chemicals affect the formation and growth of the baby. Avoid chemicals throughout the pregnancy to ensure the delivery of a healthy infant.  · Backache, varicose veins and hemorrhoids may develop or get worse.  · You will tire more easily in the third trimester, which is normal.  · The baby's movements may be stronger and more often.  · You may become short of breath easily.  · Your belly button may stick out.  · A yellow discharge may leak from your breasts called colostrum.  · You may have a bloody mucus discharge. This usually occurs a few days to a week before labor begins.  HOME CARE INSTRUCTIONS   · Keep your caregiver's appointments. Follow your caregiver's instructions regarding medication use, exercise, and diet.  · During pregnancy, you are providing food for you and your baby. Continue to eat regular, well-balanced meals. Choose foods such as meat, fish, milk and other low fat dairy products, vegetables, fruits, and whole-grain breads and cereals. Your caregiver will tell you of the ideal weight gain.  · A physical sexual relationship may be continued throughout pregnancy if there are no other problems such as early (premature) leaking of amniotic fluid from the membranes, vaginal bleeding, or belly (abdominal) pain.  · Exercise regularly if there are no restrictions. Check with your caregiver if you are unsure of the safety of your exercises. Greater weight gain will occur in the last 2 trimesters of pregnancy. Exercising helps:  · Control your weight.  · Get you in shape for labor and delivery.  · You lose weight after you deliver.  · Rest a lot with legs elevated, or as needed for leg cramps or low back pain.  · Wear a good support or jogging bra for breast tenderness during pregnancy. This may help if worn during  sleep. Pads or tissues may be used in the bra if you are leaking colostrum.  · Do not use hot tubs, steam rooms, or saunas.  · Wear your seat belt when driving. This protects you and your baby if you are in an accident.  · Avoid raw meat, cat litter boxes and soil used by cats. These carry germs that can cause birth defects in the baby.  · It is easier to loose urine during pregnancy. Tightening up and strengthening the pelvic muscles will help with this problem. You can practice stopping your urination while you are going to the bathroom. These are the same muscles you need to strengthen. It is also the muscles you would use if you were trying to stop from passing gas. You can practice tightening these muscles up 10 times a set and repeating this about 3 times per day. Once you know what muscles to tighten up, do not perform these   exercises during urination. It is more likely to cause an infection by backing up the urine.  · Ask for help if you have financial, counseling or nutritional needs during pregnancy. Your caregiver will be able to offer counseling for these needs as well as refer you for other special needs.  · Make a list of emergency phone numbers and have them available.  · Plan on getting help from family or friends when you go home from the hospital.  · Make a trial run to the hospital.  · Take prenatal classes with the father to understand, practice and ask questions about the labor and delivery.  · Prepare the baby's room/nursery.  · Do not travel out of the city unless it is absolutely necessary and with the advice of your caregiver.  · Wear only low or no heal shoes to have better balance and prevent falling.  MEDICATIONS AND DRUG USE IN PREGNANCY  · Take prenatal vitamins as directed. The vitamin should contain 1 milligram of folic acid. Keep all vitamins out of reach of children. Only a couple vitamins or tablets containing iron may be fatal to a baby or young child when ingested.  · Avoid use  of all medications, including herbs, over-the-counter medications, not prescribed or suggested by your caregiver. Only take over-the-counter or prescription medicines for pain, discomfort, or fever as directed by your caregiver. Do not use aspirin, ibuprofen (Motrin®, Advil®, Nuprin®) or naproxen (Aleve®) unless OK'd by your caregiver.  · Let your caregiver also know about herbs you may be using.  · Alcohol is related to a number of birth defects. This includes fetal alcohol syndrome. All alcohol, in any form, should be avoided completely. Smoking will cause low birth rate and premature babies.  · Street/illegal drugs are very harmful to the baby. They are absolutely forbidden. A baby born to an addicted mother will be addicted at birth. The baby will go through the same withdrawal an adult does.  SEEK MEDICAL CARE IF:  You have any concerns or worries during your pregnancy. It is better to call with your questions if you feel they cannot wait, rather than worry about them.  DECISIONS ABOUT CIRCUMCISION  You may or may not know the sex of your baby. If you know your baby is a boy, it may be time to think about circumcision. Circumcision is the removal of the foreskin of the penis. This is the skin that covers the sensitive end of the penis. There is no proven medical need for this. Often this decision is made on what is popular at the time or based upon religious beliefs and social issues. You can discuss these issues with your caregiver or pediatrician.  SEEK IMMEDIATE MEDICAL CARE IF:   · An unexplained oral temperature above 102° F (38.9° C) develops, or as your caregiver suggests.  · You have leaking of fluid from the vagina (birth canal). If leaking membranes are suspected, take your temperature and tell your caregiver of this when you call.  · There is vaginal spotting, bleeding or passing clots. Tell your caregiver of the amount and how many pads are used.  · You develop a bad smelling vaginal discharge with  a change in the color from clear to white.  · You develop vomiting that lasts more than 24 hours.  · You develop chills or fever.  · You develop shortness of breath.  · You develop burning on urination.  · You loose more than 2 pounds of weight   or gain more than 2 pounds of weight or as suggested by your caregiver.  · You notice sudden swelling of your face, hands, and feet or legs.  · You develop belly (abdominal) pain. Round ligament discomfort is a common non-cancerous (benign) cause of abdominal pain in pregnancy. Your caregiver still must evaluate you.  · You develop a severe headache that does not go away.  · You develop visual problems, blurred or double vision.  · If you have not felt your baby move for more than 1 hour. If you think the baby is not moving as much as usual, eat something with sugar in it and lie down on your left side for an hour. The baby should move at least 4 to 5 times per hour. Call right away if your baby moves less than that.  · You fall, are in a car accident or any kind of trauma.  · There is mental or physical violence at home.  Document Released: 04/24/2001 Document Revised: 07/23/2011 Document Reviewed: 10/27/2008  [employer]® Patient Information ©2013 [employer].

## 2012-04-30 NOTE — Progress Notes (Signed)
Well, no contractions, just pressure. Rev'd precautions.

## 2012-04-30 NOTE — Progress Notes (Signed)
Pulse 118  Patient reports pelvic pressure

## 2012-05-01 ENCOUNTER — Telehealth: Payer: Self-pay | Admitting: *Deleted

## 2012-05-01 DIAGNOSIS — Z3490 Encounter for supervision of normal pregnancy, unspecified, unspecified trimester: Secondary | ICD-10-CM

## 2012-05-01 DIAGNOSIS — IMO0002 Reserved for concepts with insufficient information to code with codable children: Secondary | ICD-10-CM

## 2012-05-01 DIAGNOSIS — O3410 Maternal care for benign tumor of corpus uteri, unspecified trimester: Secondary | ICD-10-CM

## 2012-05-01 DIAGNOSIS — K219 Gastro-esophageal reflux disease without esophagitis: Secondary | ICD-10-CM

## 2012-05-01 NOTE — Telephone Encounter (Signed)
Patient called complaining of hemorrhoids. States that none of the otc remedies are helping. I spoke with Dr. Erin Fulling who suggested that she mix otc hydrocortisone cream with preparation-h and apply that to the hemorrhoids. Also advised patient to use tucks pads to clean herself and to do sitz baths to help with the swelling. Pt states that she will try this.

## 2012-05-08 ENCOUNTER — Ambulatory Visit (INDEPENDENT_AMBULATORY_CARE_PROVIDER_SITE_OTHER): Payer: Medicaid Other | Admitting: Obstetrics & Gynecology

## 2012-05-08 VITALS — BP 138/88 | Temp 99.0°F | Wt 237.9 lb

## 2012-05-08 DIAGNOSIS — O3410 Maternal care for benign tumor of corpus uteri, unspecified trimester: Secondary | ICD-10-CM

## 2012-05-08 DIAGNOSIS — Z3490 Encounter for supervision of normal pregnancy, unspecified, unspecified trimester: Secondary | ICD-10-CM

## 2012-05-08 LAB — POCT URINALYSIS DIP (DEVICE)
Bilirubin Urine: NEGATIVE
Glucose, UA: NEGATIVE mg/dL
Hgb urine dipstick: NEGATIVE
Ketones, ur: NEGATIVE mg/dL
Nitrite: NEGATIVE
pH: 6 (ref 5.0–8.0)

## 2012-05-08 MED ORDER — AMOXICILLIN 500 MG PO CAPS: 500.0000 mg | ORAL_CAPSULE | Freq: Three times a day (TID) | ORAL | Status: DC

## 2012-05-08 NOTE — Progress Notes (Signed)
p-85 

## 2012-05-08 NOTE — Progress Notes (Signed)
Pt complaining of ear pain and sore throat.  No cough, fever, SOB, rhinorrhea.  Exam consistent with right OM and pharyngitis.  Will treat with Amox 500 tid for a week.  No pregnancy complaints.  Wants circ, POPs, and is breast feeding.

## 2012-05-14 NOTE — L&D Delivery Note (Signed)
Delivery Note At 1:47 PM a viable female was delivered via Vaginal, Spontaneous Delivery Presentation: Right Occiput Anterior.  APGAR: 8, 9; weight pending .   Placenta status: Intact, Spontaneous.  Cord: 3 vessels with the following complications: None.   Anesthesia: Epidural  Episiotomy: None Lacerations: None Suture Repair: N/A Est. Blood Loss (mL): 300  Mom to postpartum.  Baby to nursery-stable.  Bonnita Nasuti 05/30/2012, 2:39 PM

## 2012-05-14 NOTE — L&D Delivery Note (Signed)
Placenta was meconium stained with old mec.   No difficulty with shoulders  I attended delivery with resident.  Agree with note  Wynelle Bourgeois CNM

## 2012-05-15 ENCOUNTER — Encounter: Payer: Self-pay | Admitting: Obstetrics and Gynecology

## 2012-05-15 ENCOUNTER — Ambulatory Visit (INDEPENDENT_AMBULATORY_CARE_PROVIDER_SITE_OTHER): Payer: Medicaid Other | Admitting: Obstetrics and Gynecology

## 2012-05-15 VITALS — BP 118/68 | Temp 98.9°F | Wt 236.8 lb

## 2012-05-15 DIAGNOSIS — O3410 Maternal care for benign tumor of corpus uteri, unspecified trimester: Secondary | ICD-10-CM

## 2012-05-15 DIAGNOSIS — N898 Other specified noninflammatory disorders of vagina: Secondary | ICD-10-CM

## 2012-05-15 DIAGNOSIS — O9989 Other specified diseases and conditions complicating pregnancy, childbirth and the puerperium: Secondary | ICD-10-CM

## 2012-05-15 DIAGNOSIS — E669 Obesity, unspecified: Secondary | ICD-10-CM

## 2012-05-15 DIAGNOSIS — O26899 Other specified pregnancy related conditions, unspecified trimester: Secondary | ICD-10-CM

## 2012-05-15 DIAGNOSIS — Z3490 Encounter for supervision of normal pregnancy, unspecified, unspecified trimester: Secondary | ICD-10-CM

## 2012-05-15 DIAGNOSIS — D259 Leiomyoma of uterus, unspecified: Secondary | ICD-10-CM

## 2012-05-15 MED ORDER — NYSTATIN 100000 UNIT/GM EX CREA: TOPICAL_CREAM | Freq: Two times a day (BID) | CUTANEOUS | Status: DC

## 2012-05-15 MED ORDER — FLUCONAZOLE 150 MG PO TABS: 150.0000 mg | ORAL_TABLET | Freq: Once | ORAL | Status: DC

## 2012-05-15 NOTE — Progress Notes (Signed)
P=99, States stopped taking the amoxicillin because it made her perineal area rash, swelling, white discharge. Declines flu shot and tdap today- states just doesn't feel like it.

## 2012-05-15 NOTE — Progress Notes (Signed)
Patient reports terrible perineal pruritis secondary to amoxicillin. Patient did not complete course. Patient states pruritis is so bad she is fearful to urinate secondary to burning sensation. Patient did not provide a urine sample this morning. Wet prep collected but on exam discharge consistent with yeast infection. Rx diflucan provided. Patient declined cervical check but is having irregular contractions.

## 2012-05-15 NOTE — Addendum Note (Signed)
Addended by: Catalina Antigua on: 05/15/2012 11:13 AM   Modules accepted: Orders

## 2012-05-15 NOTE — Addendum Note (Signed)
Addended by: Gerome Apley on: 05/15/2012 12:04 PM   Modules accepted: Orders

## 2012-05-19 ENCOUNTER — Encounter: Payer: Self-pay | Admitting: *Deleted

## 2012-05-19 ENCOUNTER — Other Ambulatory Visit: Payer: Self-pay | Admitting: Obstetrics and Gynecology

## 2012-05-19 MED ORDER — METRONIDAZOLE 500 MG PO TABS: 500.0000 mg | ORAL_TABLET | Freq: Two times a day (BID) | ORAL | Status: DC

## 2012-05-21 ENCOUNTER — Ambulatory Visit (INDEPENDENT_AMBULATORY_CARE_PROVIDER_SITE_OTHER): Payer: Medicaid Other | Admitting: Advanced Practice Midwife

## 2012-05-21 ENCOUNTER — Telehealth: Payer: Self-pay | Admitting: Obstetrics and Gynecology

## 2012-05-21 VITALS — BP 113/79 | Temp 97.4°F | Wt 236.0 lb

## 2012-05-21 DIAGNOSIS — O479 False labor, unspecified: Secondary | ICD-10-CM

## 2012-05-21 LAB — POCT URINALYSIS DIP (DEVICE)
Glucose, UA: NEGATIVE mg/dL
Ketones, ur: NEGATIVE mg/dL
Leukocytes, UA: NEGATIVE
Protein, ur: 30 mg/dL — AB
Specific Gravity, Urine: 1.025 (ref 1.005–1.030)
Urobilinogen, UA: 1 mg/dL (ref 0.0–1.0)

## 2012-05-21 NOTE — Telephone Encounter (Addendum)
Message copied by Toula Moos on Wed May 21, 2012  8:30 AM  ------ Called patient and left message to call clinic back for important message (pertaining to Dr. Deretha Emory copied note below for BV tx).          Message from: CONSTANT, PEGGY      Created: Mon May 19, 2012  2:35 PM       Please inform patient of positive BV in addition to yeast infection. Patient already received a rx for diflucan. Please inform patient of rx flagyl which was e-prescribed.            Thanks      [employer]

## 2012-05-21 NOTE — Telephone Encounter (Signed)
Patient called back and I informed her of BV and Rx at her pharmacy. Patient states that she's already picked it up and was told of this earlier.

## 2012-05-21 NOTE — Progress Notes (Signed)
Pulse: 113

## 2012-05-21 NOTE — Progress Notes (Signed)
Doing OK. Wants info on circs, number given to call [facility] re: cost.  Labor precautions reviewed

## 2012-05-21 NOTE — Patient Instructions (Addendum)
Normal Labor and Delivery  Your caregiver must first be sure you are in labor. Signs of labor include:  · You may pass what is called "the mucus plug" before labor begins. This is a small amount of blood stained mucus.  · Regular uterine contractions.  · The time between contractions get closer together.  · The discomfort and pain gradually gets more intense.  · Pains are mostly located in the back.  · Pains get worse when walking.  · The cervix (the opening of the uterus becomes thinner (begins to efface) and opens up (dilates).  Once you are in labor and admitted into the hospital or care center, your caregiver will do the following:  · A complete physical examination.  · Check your vital signs (blood pressure, pulse, temperature and the fetal heart rate).  · Do a vaginal examination (using a sterile glove and lubricant) to determine:  · The position (presentation) of the baby (head [vertex] or buttock first).  · The level (station) of the baby's head in the birth canal.  · The effacement and dilatation of the cervix.  · You may have your pubic hair shaved and be given an enema depending on your caregiver and the circumstance.  · An electronic monitor is usually placed on your abdomen. The monitor follows the length and intensity of the contractions, as well as the baby's heart rate.  · Usually, your caregiver will insert an IV in your arm with a bottle of sugar water. This is done as a precaution so that medications can be given to you quickly during labor or delivery.  NORMAL LABOR AND DELIVERY IS DIVIDED UP INTO 3 STAGES:  First Stage  This is when regular contractions begin and the cervix begins to efface and dilate. This stage can last from 3 to 15 hours. The end of the first stage is when the cervix is 100% effaced and 10 centimeters dilated. Pain medications may be given by   · Injection (morphine, demerol, etc.)  · Regional anesthesia (spinal, caudal or epidural, anesthetics given in different locations of  the spine). Paracervical pain medication may be given, which is an injection of and anesthetic on each side of the cervix.  A pregnant woman may request to have "Natural Childbirth" which is not to have any medications or anesthesia during her labor and delivery.  Second Stage  This is when the baby comes down through the birth canal (vagina) and is born. This can take 1 to 4 hours. As the baby's head comes down through the birth canal, you may feel like you are going to have a bowel movement. You will get the urge to bear down and push until the baby is delivered. As the baby's head is being delivered, the caregiver will decide if an episiotomy (a cut in the perineum and vagina area) is needed to prevent tearing of the tissue in this area. The episiotomy is sewn up after the delivery of the baby and placenta. Sometimes a mask with nitrous oxide is given for the mother to breath during the delivery of the baby to help if there is too much pain. The end of Stage 2 is when the baby is fully delivered. Then when the umbilical cord stops pulsating it is clamped and cut.  Third Stage  The third stage begins after the baby is completely delivered and ends after the placenta (afterbirth) is delivered. This usually takes 5 to 30 minutes. After the placenta is delivered, a medication   is given either by intravenous or injection to help contract the uterus and prevent bleeding. The third stage is not painful and pain medication is usually not necessary. If an episiotomy was done, it is repaired at this time.  After the delivery, the mother is watched and monitored closely for 1 to 2 hours to make sure there is no postpartum bleeding (hemorrhage). If there is a lot of bleeding, medication is given to contract the uterus and stop the bleeding.  Document Released: 02/07/2008 Document Revised: 07/23/2011 Document Reviewed: 02/07/2008  Exit[employer]® Patient Information ©2013 [employer].

## 2012-05-28 ENCOUNTER — Ambulatory Visit (INDEPENDENT_AMBULATORY_CARE_PROVIDER_SITE_OTHER): Payer: Medicaid Other | Admitting: Advanced Practice Midwife

## 2012-05-28 VITALS — BP 129/82 | Temp 98.3°F | Wt 237.8 lb

## 2012-05-28 DIAGNOSIS — O3410 Maternal care for benign tumor of corpus uteri, unspecified trimester: Secondary | ICD-10-CM

## 2012-05-28 DIAGNOSIS — K219 Gastro-esophageal reflux disease without esophagitis: Secondary | ICD-10-CM

## 2012-05-28 LAB — POCT URINALYSIS DIP (DEVICE)
Glucose, UA: NEGATIVE mg/dL
Nitrite: NEGATIVE
Specific Gravity, Urine: >=1.03 (ref 1.005–1.030)
Urobilinogen, UA: 1 mg/dL (ref 0.0–1.0)

## 2012-05-28 NOTE — Progress Notes (Signed)
Pulse: 86

## 2012-05-28 NOTE — Patient Instructions (Signed)
Normal Labor and Delivery  Your caregiver must first be sure you are in labor. Signs of labor include:  · You may pass what is called "the mucus plug" before labor begins. This is a small amount of blood stained mucus.  · Regular uterine contractions.  · The time between contractions get closer together.  · The discomfort and pain gradually gets more intense.  · Pains are mostly located in the back.  · Pains get worse when walking.  · The cervix (the opening of the uterus becomes thinner (begins to efface) and opens up (dilates).  Once you are in labor and admitted into the hospital or care center, your caregiver will do the following:  · A complete physical examination.  · Check your vital signs (blood pressure, pulse, temperature and the fetal heart rate).  · Do a vaginal examination (using a sterile glove and lubricant) to determine:  · The position (presentation) of the baby (head [vertex] or buttock first).  · The level (station) of the baby's head in the birth canal.  · The effacement and dilatation of the cervix.  · You may have your pubic hair shaved and be given an enema depending on your caregiver and the circumstance.  · An electronic monitor is usually placed on your abdomen. The monitor follows the length and intensity of the contractions, as well as the baby's heart rate.  · Usually, your caregiver will insert an IV in your arm with a bottle of sugar water. This is done as a precaution so that medications can be given to you quickly during labor or delivery.  NORMAL LABOR AND DELIVERY IS DIVIDED UP INTO 3 STAGES:  First Stage  This is when regular contractions begin and the cervix begins to efface and dilate. This stage can last from 3 to 15 hours. The end of the first stage is when the cervix is 100% effaced and 10 centimeters dilated. Pain medications may be given by   · Injection (morphine, demerol, etc.)  · Regional anesthesia (spinal, caudal or epidural, anesthetics given in different locations of  the spine). Paracervical pain medication may be given, which is an injection of and anesthetic on each side of the cervix.  A pregnant woman may request to have "Natural Childbirth" which is not to have any medications or anesthesia during her labor and delivery.  Second Stage  This is when the baby comes down through the birth canal (vagina) and is born. This can take 1 to 4 hours. As the baby's head comes down through the birth canal, you may feel like you are going to have a bowel movement. You will get the urge to bear down and push until the baby is delivered. As the baby's head is being delivered, the caregiver will decide if an episiotomy (a cut in the perineum and vagina area) is needed to prevent tearing of the tissue in this area. The episiotomy is sewn up after the delivery of the baby and placenta. Sometimes a mask with nitrous oxide is given for the mother to breath during the delivery of the baby to help if there is too much pain. The end of Stage 2 is when the baby is fully delivered. Then when the umbilical cord stops pulsating it is clamped and cut.  Third Stage  The third stage begins after the baby is completely delivered and ends after the placenta (afterbirth) is delivered. This usually takes 5 to 30 minutes. After the placenta is delivered, a medication   is given either by intravenous or injection to help contract the uterus and prevent bleeding. The third stage is not painful and pain medication is usually not necessary. If an episiotomy was done, it is repaired at this time.  After the delivery, the mother is watched and monitored closely for 1 to 2 hours to make sure there is no postpartum bleeding (hemorrhage). If there is a lot of bleeding, medication is given to contract the uterus and stop the bleeding.  Document Released: 02/07/2008 Document Revised: 07/23/2011 Document Reviewed: 02/07/2008  ExitCare® Patient Information ©2013 [employer].

## 2012-05-28 NOTE — Progress Notes (Signed)
Discussed induction of labor. May need Cytotec or Foley+Pitocin.   Scheduled Friday at 7am.

## 2012-05-29 ENCOUNTER — Inpatient Hospital Stay (HOSPITAL_COMMUNITY)
Admission: AD | Admit: 2012-05-29 | Discharge: 2012-06-01 | DRG: 775 | Disposition: A | Discharge status: Home / Self Care | Payer: Medicaid Other | Source: Ambulatory Visit | Attending: Obstetrics and Gynecology | Admitting: Obstetrics and Gynecology

## 2012-05-29 ENCOUNTER — Telehealth (HOSPITAL_COMMUNITY): Payer: Self-pay | Admitting: *Deleted

## 2012-05-29 ENCOUNTER — Encounter (HOSPITAL_COMMUNITY): Payer: Self-pay | Admitting: *Deleted

## 2012-05-29 DIAGNOSIS — O4290 Premature rupture of membranes, unspecified as to length of time between rupture and onset of labor, unspecified weeks of gestation: Secondary | ICD-10-CM

## 2012-05-29 DIAGNOSIS — O3410 Maternal care for benign tumor of corpus uteri, unspecified trimester: Secondary | ICD-10-CM

## 2012-05-29 DIAGNOSIS — O480 Post-term pregnancy: Principal | ICD-10-CM | POA: Diagnosis present

## 2012-05-29 DIAGNOSIS — K219 Gastro-esophageal reflux disease without esophagitis: Secondary | ICD-10-CM

## 2012-05-29 DIAGNOSIS — IMO0002 Reserved for concepts with insufficient information to code with codable children: Secondary | ICD-10-CM

## 2012-05-29 DIAGNOSIS — Z3490 Encounter for supervision of normal pregnancy, unspecified, unspecified trimester: Secondary | ICD-10-CM

## 2012-05-29 NOTE — Telephone Encounter (Signed)
Preadmission screen  

## 2012-05-29 NOTE — MAU Note (Signed)
Pt reports ?ROM at 2015, contractions q 12 minutes

## 2012-05-30 ENCOUNTER — Encounter (HOSPITAL_COMMUNITY): Payer: Self-pay | Admitting: Anesthesiology

## 2012-05-30 ENCOUNTER — Inpatient Hospital Stay (HOSPITAL_COMMUNITY): Admission: RE | Admit: 2012-05-30 | Payer: Medicaid Other | Source: Ambulatory Visit

## 2012-05-30 ENCOUNTER — Encounter (HOSPITAL_COMMUNITY): Payer: Self-pay | Admitting: Obstetrics and Gynecology

## 2012-05-30 ENCOUNTER — Inpatient Hospital Stay (HOSPITAL_COMMUNITY): Payer: Medicaid Other | Admitting: Anesthesiology

## 2012-05-30 DIAGNOSIS — O480 Post-term pregnancy: Secondary | ICD-10-CM

## 2012-05-30 DIAGNOSIS — O4290 Premature rupture of membranes, unspecified as to length of time between rupture and onset of labor, unspecified weeks of gestation: Secondary | ICD-10-CM

## 2012-05-30 LAB — CBC
Hemoglobin: 11.4 g/dL — ABNORMAL LOW (ref 12.0–15.0)
MCH: 29 pg (ref 26.0–34.0)
Platelets: 290 10*3/uL (ref 150–400)
RBC: 3.93 MIL/uL (ref 3.87–5.11)
WBC: 6.4 10*3/uL (ref 4.0–10.5)

## 2012-05-30 LAB — RPR: RPR Ser Ql: NONREACTIVE

## 2012-05-30 LAB — PREPARE RBC (CROSSMATCH)

## 2012-05-30 MED ORDER — ZOLPIDEM TARTRATE 5 MG PO TABS: 5.0000 mg | ORAL_TABLET | Freq: Every evening | ORAL | Status: DC | PRN

## 2012-05-30 MED ORDER — FENTANYL 2.5 MCG/ML BUPIVACAINE 1/10 % EPIDURAL INFUSION (WH - ANES)
14.0000 mL/h | INTRAMUSCULAR | Status: DC
  Administered 2012-05-30: 14 mL/h via EPIDURAL
  Filled 2012-05-30 (×2): qty 125

## 2012-05-30 MED ORDER — DIPHENHYDRAMINE HCL 25 MG PO CAPS: 25.0000 mg | ORAL_CAPSULE | Freq: Four times a day (QID) | ORAL | Status: DC | PRN

## 2012-05-30 MED ORDER — OXYTOCIN BOLUS FROM INFUSION: 500.0000 mL | INTRAVENOUS | Status: DC

## 2012-05-30 MED ORDER — TETANUS-DIPHTH-ACELL PERTUSSIS 5-2.5-18.5 LF-MCG/0.5 IM SUSP
0.5000 mL | Freq: Once | INTRAMUSCULAR | Status: AC
  Administered 2012-05-31: 0.5 mL via INTRAMUSCULAR
  Filled 2012-05-30: qty 0.5

## 2012-05-30 MED ORDER — OXYTOCIN 40 UNITS IN LACTATED RINGERS INFUSION - SIMPLE MED
1.0000 m[IU]/min | INTRAVENOUS | Status: DC
  Administered 2012-05-30: 1 m[IU]/min via INTRAVENOUS
  Filled 2012-05-30: qty 1000

## 2012-05-30 MED ORDER — SIMETHICONE 80 MG PO CHEW: 80.0000 mg | CHEWABLE_TABLET | ORAL | Status: DC | PRN

## 2012-05-30 MED ORDER — PHENYLEPHRINE 40 MCG/ML (10ML) SYRINGE FOR IV PUSH (FOR BLOOD PRESSURE SUPPORT)
80.0000 ug | PREFILLED_SYRINGE | INTRAVENOUS | Status: DC | PRN
  Filled 2012-05-30: qty 5

## 2012-05-30 MED ORDER — OXYTOCIN 40 UNITS IN LACTATED RINGERS INFUSION - SIMPLE MED
62.5000 mL/h | INTRAVENOUS | Status: DC
  Administered 2012-05-30: 62.5 mL/h via INTRAVENOUS

## 2012-05-30 MED ORDER — TERBUTALINE SULFATE 1 MG/ML IJ SOLN: 0.2500 mg | Freq: Once | INTRAMUSCULAR | Status: DC | PRN

## 2012-05-30 MED ORDER — LIDOCAINE HCL (PF) 1 % IJ SOLN
INTRAMUSCULAR | Status: DC | PRN
  Administered 2012-05-30 (×2): 4 mL

## 2012-05-30 MED ORDER — BUPIVACAINE HCL (PF) 0.25 % IJ SOLN
INTRAMUSCULAR | Status: DC | PRN
  Administered 2012-05-30: 3 mL
  Administered 2012-05-30: 5 mL
  Administered 2012-05-30: 2 mL

## 2012-05-30 MED ORDER — LACTATED RINGERS IV SOLN
INTRAVENOUS | Status: DC
  Administered 2012-05-30 (×3): via INTRAVENOUS

## 2012-05-30 MED ORDER — LACTATED RINGERS IV SOLN: 500.0000 mL | Freq: Once | INTRAVENOUS | Status: DC

## 2012-05-30 MED ORDER — ONDANSETRON HCL 4 MG/2ML IJ SOLN
4.0000 mg | Freq: Four times a day (QID) | INTRAMUSCULAR | Status: DC | PRN
  Administered 2012-05-30: 4 mg via INTRAVENOUS
  Filled 2012-05-30: qty 2

## 2012-05-30 MED ORDER — EPHEDRINE 5 MG/ML INJ
10.0000 mg | INTRAVENOUS | Status: DC | PRN
  Filled 2012-05-30: qty 4

## 2012-05-30 MED ORDER — LIDOCAINE HCL (PF) 1 % IJ SOLN: 30.0000 mL | INTRAMUSCULAR | Status: DC | PRN

## 2012-05-30 MED ORDER — IBUPROFEN 600 MG PO TABS: 600.0000 mg | ORAL_TABLET | Freq: Four times a day (QID) | ORAL | Status: DC | PRN

## 2012-05-30 MED ORDER — FLEET ENEMA 7-19 GM/118ML RE ENEM: 1.0000 | ENEMA | RECTAL | Status: DC | PRN

## 2012-05-30 MED ORDER — DIPHENHYDRAMINE HCL 50 MG/ML IJ SOLN: 12.5000 mg | INTRAMUSCULAR | Status: DC | PRN

## 2012-05-30 MED ORDER — LANOLIN HYDROUS EX OINT: TOPICAL_OINTMENT | CUTANEOUS | Status: DC | PRN

## 2012-05-30 MED ORDER — NALBUPHINE SYRINGE 5 MG/0.5 ML
10.0000 mg | INJECTION | INTRAMUSCULAR | Status: DC | PRN
  Administered 2012-05-30: 10 mg via INTRAVENOUS
  Filled 2012-05-30: qty 1

## 2012-05-30 MED ORDER — SENNOSIDES-DOCUSATE SODIUM 8.6-50 MG PO TABS
2.0000 | ORAL_TABLET | Freq: Every day | ORAL | Status: DC
  Administered 2012-05-30: 2 via ORAL

## 2012-05-30 MED ORDER — OXYTOCIN 40 UNITS IN LACTATED RINGERS INFUSION - SIMPLE MED: 62.5000 mL/h | INTRAVENOUS | Status: DC

## 2012-05-30 MED ORDER — IBUPROFEN 600 MG PO TABS
600.0000 mg | ORAL_TABLET | Freq: Four times a day (QID) | ORAL | Status: DC
  Administered 2012-05-30 – 2012-06-01 (×8): 600 mg via ORAL
  Filled 2012-05-30 (×8): qty 1

## 2012-05-30 MED ORDER — PRENATAL MULTIVITAMIN CH
1.0000 | ORAL_TABLET | Freq: Every day | ORAL | Status: DC
  Administered 2012-05-30 – 2012-06-01 (×3): 1 via ORAL
  Filled 2012-05-30 (×2): qty 1

## 2012-05-30 MED ORDER — EPHEDRINE 5 MG/ML INJ: 10.0000 mg | INTRAVENOUS | Status: DC | PRN

## 2012-05-30 MED ORDER — FENTANYL CITRATE 0.05 MG/ML IJ SOLN
INTRAMUSCULAR | Status: AC
  Administered 2012-05-30: 50 ug via INTRAVENOUS
  Filled 2012-05-30: qty 2

## 2012-05-30 MED ORDER — FENTANYL 2.5 MCG/ML BUPIVACAINE 1/10 % EPIDURAL INFUSION (WH - ANES)
INTRAMUSCULAR | Status: DC | PRN
  Administered 2012-05-30: 14 mL/h via EPIDURAL

## 2012-05-30 MED ORDER — ONDANSETRON HCL 4 MG/2ML IJ SOLN: 4.0000 mg | INTRAMUSCULAR | Status: DC | PRN

## 2012-05-30 MED ORDER — WITCH HAZEL-GLYCERIN EX PADS
1.0000 "application " | MEDICATED_PAD | CUTANEOUS | Status: DC | PRN
  Administered 2012-05-31: 1 via TOPICAL

## 2012-05-30 MED ORDER — CITRIC ACID-SODIUM CITRATE 334-500 MG/5ML PO SOLN: 30.0000 mL | ORAL | Status: DC | PRN

## 2012-05-30 MED ORDER — ACETAMINOPHEN 325 MG PO TABS: 650.0000 mg | ORAL_TABLET | ORAL | Status: DC | PRN

## 2012-05-30 MED ORDER — OXYCODONE-ACETAMINOPHEN 5-325 MG PO TABS: 1.0000 | ORAL_TABLET | ORAL | Status: DC | PRN

## 2012-05-30 MED ORDER — ONDANSETRON HCL 4 MG PO TABS
4.0000 mg | ORAL_TABLET | ORAL | Status: DC | PRN
  Administered 2012-05-30: 4 mg via ORAL
  Filled 2012-05-30: qty 1

## 2012-05-30 MED ORDER — FENTANYL CITRATE 0.05 MG/ML IJ SOLN
50.0000 ug | Freq: Once | INTRAMUSCULAR | Status: AC
  Administered 2012-05-30: 50 ug via INTRAVENOUS
  Filled 2012-05-30: qty 2

## 2012-05-30 MED ORDER — DIBUCAINE 1 % RE OINT
1.0000 "application " | TOPICAL_OINTMENT | RECTAL | Status: DC | PRN
  Administered 2012-05-31: 1 via RECTAL
  Filled 2012-05-30: qty 28

## 2012-05-30 MED ORDER — LACTATED RINGERS IV SOLN
INTRAVENOUS | Status: DC
  Administered 2012-05-30: 13:00:00 via INTRAUTERINE

## 2012-05-30 MED ORDER — LACTATED RINGERS IV SOLN: 500.0000 mL | INTRAVENOUS | Status: DC | PRN

## 2012-05-30 MED ORDER — BENZOCAINE-MENTHOL 20-0.5 % EX AERO: 1.0000 "application " | INHALATION_SPRAY | CUTANEOUS | Status: DC | PRN

## 2012-05-30 MED ORDER — PHENYLEPHRINE 40 MCG/ML (10ML) SYRINGE FOR IV PUSH (FOR BLOOD PRESSURE SUPPORT)
80.0000 ug | PREFILLED_SYRINGE | INTRAVENOUS | Status: DC | PRN
  Administered 2012-05-30: 80 ug via INTRAVENOUS

## 2012-05-30 NOTE — Anesthesia Preprocedure Evaluation (Signed)
Anesthesia Evaluation  Patient identified by MRN, date of birth, ID band Patient awake    Reviewed: Allergy & Precautions, H&P , Patient's Chart, lab work & pertinent test results  Airway Mallampati: III TM Distance: >3 FB Neck ROM: Full    Dental No notable dental hx. (+) Teeth Intact   Pulmonary neg pulmonary ROS,  breath sounds clear to auscultation  Pulmonary exam normal       Cardiovascular Rhythm:Regular Rate:Normal     Neuro/Psych negative neurological ROS  negative psych ROS   GI/Hepatic GERD-  Medicated and Controlled,  Endo/Other  Morbid obesityPCOS  Renal/GU   negative genitourinary   Musculoskeletal negative musculoskeletal ROS (+)   Abdominal (+) + obese,   Peds  Hematology negative hematology ROS (+)   Anesthesia Other Findings   Reproductive/Obstetrics (+) Pregnancy Hx/o Infertility                           Anesthesia Physical Anesthesia Plan  ASA: III  Anesthesia Plan: Epidural   Post-op Pain Management:    Induction:   Airway Management Planned: Natural Airway  Additional Equipment:   Intra-op Plan:   Post-operative Plan:   Informed Consent: I have reviewed the patients History and Physical, chart, labs and discussed the procedure including the risks, benefits and alternatives for the proposed anesthesia with the patient or authorized representative who has indicated his/her understanding and acceptance.   Dental advisory given  Plan Discussed with: Anesthesiologist  Anesthesia Plan Comments:         Anesthesia Quick Evaluation

## 2012-05-30 NOTE — Progress Notes (Signed)
Patient ID: ONEITA ALLMON, female   DOB: January 09, 1976, 37 y.o.   MRN: [id number]  S:  Pt admitted, comfortable, IV in place.  O:   Filed Vitals:   05/30/12 0048  BP: 129/78  Pulse: 95  Temp: 98.3 F (36.8 C)  Resp: 18    Cervix:  1/50/-2 Foley bulb placed.   FHTs:  130, mod var, accels present, occasional variable TOCO: irregular ctx  A/P Will start pitocin when foley bulb out.  Napoleon Form, MD 05/30/2012  1:32 AM

## 2012-05-30 NOTE — Progress Notes (Signed)
Melissa [name] is a 37 y.o. G4P1021 at [redacted]w[redacted]d by admitted for SROM, SOL.  Subjective: Uncomfortable and tired, tearful throughout evaluation.   Objective: BP 135/80  Pulse 104  Temp 97.8 F (36.6 C) (Oral)  Resp 18  Ht 5\' 4"  (1.626 m)  Wt 107.502 kg (237 lb)  BMI 40.68 kg/m2  LMP 08/17/2011   Total I/O In: -  Out: 350 [Urine:350]  FHT:  FHR: 125 bpm, variability: moderate,  accelerations:  Abscent,  decelerations:  Present early UC:   irregular, every 1.5-5 minutes SVE:   Dilation: 5 Effacement (%): 80 Station: -2 Exam by:: Mayford Knife, M. CNM  Labs: Lab Results  Component Value Date   WBC 6.4 05/30/2012   HGB 11.4* 05/30/2012   HCT 33.8* 05/30/2012   MCV 86.0 05/30/2012   PLT 290 05/30/2012    Assessment / Plan: Spontaneous labor, progressing normally IUPC placed, Starting amnioinfusion  Labor: Progressing normally Fetal Wellbeing:  Category I Pain Control:  Epidural and Fentanyl Anticipated MOD:  NSVD  Melissa [name] 05/30/2012, 11:03 AM

## 2012-05-30 NOTE — H&P (Signed)
Melissa [name] is a 37 y.o. female presenting for Ruptured Membranes.  Pt gets prenatal care in [facility]. No complications this pregnancy, late to care. No complications of other pregnancies. ROM occurred at 8:15 PM. Not really contracting, only occasionally. Scheduled for induction 05/30/12 for post-dates.  Maternal Medical History:  Reason for admission: Reason for admission: rupture of membranes.  Reason for Admission:   nauseaContractions: Onset was 3-5 hours ago.   Frequency: irregular.   Perceived severity is mild.    Fetal activity: Perceived fetal activity is normal.   Last perceived fetal movement was within the past hour.    Prenatal complications: no prenatal complications   OB History    Grav Para Term Preterm Abortions TAB SAB Ect Mult Living   4 1 1  0 2  1 1  0 1     Past Medical History  Diagnosis Date  . Abnormal Pap smear 08/30/2008    LGSIL  . Infertility     Secondary PCOS  . PCOS (polycystic ovarian syndrome)   . Chlamydia   . Fibroid   . Shingles outbreak 11/2011   Past Surgical History  Procedure Date  . Wisdom tooth extraction   . Right ear surgery   . Laparoscopy 07/05/2011    Procedure: LAPAROSCOPY OPERATIVE;  Surgeon: Tereso Newcomer, MD;  Location: [facility];  Service: Gynecology;  Laterality: N/A;  . Ear tube removal   . Dilation and curettage of uterus 06/2011    ectopic   Family History: family history includes Cancer in her brother and father; Crohn's disease in her brother; Diabetes in her brother and mother; Fibroids in her sister; Hypertension in her brother, mother, and sister; and Kidney disease in her brother.  There is no history of Anesthesia problems. Social History:  reports that she has never smoked. She has never used smokeless tobacco. She reports that she does not drink alcohol or use illicit drugs.   Prenatal Transfer Tool  Maternal Diabetes: No Genetic Screening: Declined Maternal  Ultrasounds/Referrals: Normal Fetal Ultrasounds or other Referrals:  None Maternal Substance Abuse:  No Significant Maternal Medications:  None Significant Maternal Lab Results:  Lab values include: Group B Strep negative Other Comments:  None  Review of Systems  Constitutional: Negative for fever and chills.  Eyes: Negative for blurred vision and double vision.  Respiratory: Negative for shortness of breath.   Gastrointestinal: Negative for nausea and vomiting.  Genitourinary: Negative for dysuria.  Neurological: Negative for dizziness and headaches.    Dilation: 1 Effacement (%): 50 Station: -2 Exam by:: M.Topp,RN Blood pressure 116/80, pulse 128, temperature 98.3 F (36.8 C), temperature source Oral, resp. rate 18, height 5\' 4"  (1.626 m), weight 107.502 kg (237 lb), last menstrual period 08/17/2011. Maternal Exam:  Uterine Assessment: Contraction strength is mild.  Introitus: Normal vulva. Vagina is positive for vaginal discharge (yellow/green fluid at introitus).  Ferning test: positive.   Pelvis: adequate for delivery.   Cervix: Cervix evaluated by digital exam.     Fetal Exam Fetal Monitor Review: Mode: ultrasound.   Baseline rate: 135.  Variability: moderate (6-25 bpm).   Pattern: accelerations present and variable decelerations.       Physical Exam  Constitutional: She is oriented to person, place, and time. She appears well-developed and well-nourished. No distress.  HENT:  Head: Normocephalic and atraumatic.  Eyes: Conjunctivae normal and EOM are normal.  Neck: Normal range of motion. Neck supple.  Cardiovascular: Normal rate.   Respiratory: Effort normal.  No respiratory distress.  GI: Soft. There is no tenderness. There is no rebound and no guarding.  Genitourinary: Vaginal discharge (yellow/green fluid at introitus) found.  Musculoskeletal: Normal range of motion. She exhibits no edema and no tenderness.  Neurological: She is alert and oriented to person,  place, and time.  Skin: Skin is warm and dry.  Psychiatric: She has a normal mood and affect.    Prenatal labs: ABO, Rh: --/--/O POS (02/20 2320) Antibody:   Rubella: 84.3 (06/12 1151) RPR: NON REAC (10/30 1212)  HBsAg: NEGATIVE (06/12 1151)  HIV: NON REACTIVE (06/12 1151)  GBS: Negative (12/11 0000)   Assessment/Plan: 37 y.o. G4P1021 at [redacted]w[redacted]d with ROM at 8:15 PM - Admit for labor, FB then pitocin if not contracting on own - GBS neg - Light meconium stained fluid - Anticipate SVD  Napoleon Form 05/30/2012, 12:31 AM

## 2012-05-30 NOTE — Progress Notes (Signed)
Patient ID: Melissa [name], female   DOB: 11-19-1975, 37 y.o.   MRN: [id number]   S:  Came to room for decels. Patient in tears, very anxious. Sobbing after cervical check. O:   Filed Vitals:   05/30/12 0048  BP: 129/78  Pulse: 95  Temp: 98.3 F (36.8 C)  Resp: 18     Cervix:  5/50/-2 Foley bulb out  FHTs:  First to 90s with slow return to baseline. Second to 100s with quicker return. Then a decel to 90 taking about 1 minute to nadir, 1 minute in 90s and 1 minute to go back up to baseline. Good variability throughout and good recovery. Baseline now back to 130, mod var, accels.  CTX: not tracing well. Fetal scalp electrode placed  A/P Induction of labor for PROM. Progressing rapidly with foley bulb. Pt to get epidural. Will monitor FHTs closely and recheck in a few hours.  If not contracting, will consider pitocin.  Napoleon Form, MD 05/30/2012 3:07 AM

## 2012-05-30 NOTE — Progress Notes (Signed)
Lying on side, with eyes closed. Patient asking for c/s delivery. Sates she is tired of having pain and feeling nauseated. Got meds for nausea which helped, but expresses frustration.  FHR stable and reassuring  UCs every 3-4 minutes  Cervix deferred.  Will continue to observe

## 2012-05-30 NOTE — Anesthesia Procedure Notes (Signed)
Epidural Patient location during procedure: OB Start time: 05/30/2012 3:29 AM  Staffing Anesthesiologist: Salvatrice Morandi A. Performed by: anesthesiologist   Preanesthetic Checklist Completed: patient identified, site marked, surgical consent, pre-op evaluation, timeout performed, IV checked, risks and benefits discussed and monitors and equipment checked  Epidural Patient position: sitting Prep: site prepped and draped and DuraPrep Patient monitoring: continuous pulse ox and blood pressure Approach: midline Injection technique: LOR air  Needle:  Needle type: Tuohy  Needle gauge: 17 G Needle length: 9 cm and 9 Needle insertion depth: 6 cm Catheter type: closed end flexible Catheter size: 19 Gauge Catheter at skin depth: 11 cm Test dose: negative and Other  Assessment Events: blood not aspirated, injection not painful, no injection resistance, negative IV test and no paresthesia  Additional Notes Patient identified. Risks and benefits discussed including failed block, incomplete  Pain control, post dural puncture headache, nerve damage, paralysis, blood pressure Changes, nausea, vomiting, reactions to medications-both toxic and allergic and post Partum back pain. All questions were answered. Patient expressed understanding and wished to proceed. Sterile technique was used throughout procedure. Epidural site was Dressed with sterile barrier dressing. No paresthesias, signs of intravascular injection Or signs of intrathecal spread were encountered.  Patient was more comfortable after the epidural was dosed. Please see RN's note for documentation of vital signs and FHR which are stable.

## 2012-05-31 NOTE — Clinical Social Work Note (Signed)
CSW aware of consult and will consult with MOB in a.m. 1/19.  Please page CSW if any needs arise or if discharge is planned before this time.   [phone] 

## 2012-05-31 NOTE — Progress Notes (Signed)
I examined patient and agree with resident's note and plan of care.  Pt having some difficulties breastfeeding- still waiting on lactation consultant.  Prefers to stay until tomorrow.   Planning outpatient circumcision. Micronor for contraception Cheral Marker, CNM, [facility]NP-BC

## 2012-05-31 NOTE — Anesthesia Postprocedure Evaluation (Signed)
  Anesthesia Post-op Note  Anesthesia Post Note  Patient: Melissa [name]  Anesthesia type: Epidural  Patient location: Mother/Baby  Post pain: Pain level controlled  Post assessment: Post-op Vital signs reviewed  Last Vitals: BP 112/71  Pulse 74  Temp 37.1 C (Oral)  Resp 16  Ht 5\' 4"  (1.626 m)  Wt 237 lb (107.502 kg)  BMI 40.68 kg/m2  SpO2 99%  LMP 08/17/2011  Breastfeeding? Unknown  Post vital signs: Reviewed  Level of consciousness: awake  Complications: No apparent anesthesia complications

## 2012-05-31 NOTE — Progress Notes (Signed)
Post Partum Day 1 Subjective: no complaints, up ad lib, voiding, tolerating PO and + flatus  Objective: Blood pressure 112/71, pulse 74, temperature 98.7 F (37.1 C), temperature source Oral, resp. rate 16, height 5\' 4"  (1.626 m), weight 107.502 kg (237 lb), last menstrual period 08/17/2011, SpO2 99.00%, unknown if currently breastfeeding.  Physical Exam:  General: alert, cooperative, appears stated age and no distress Lochia: appropriate Uterine Fundus: firm Incision: n/a DVT Evaluation: No evidence of DVT seen on physical exam.   Basename 05/30/12 0110  HGB 11.4*  HCT 33.8*    Assessment/Plan: Plan for discharge tomorrow, Breastfeeding and Lactation consult   LOS: 2 days   Melissa [name] 05/31/2012, 7:45 AM

## 2012-06-01 MED ORDER — IBUPROFEN 600 MG PO TABS: 600.0000 mg | ORAL_TABLET | Freq: Four times a day (QID) | ORAL | Status: DC | PRN

## 2012-06-01 NOTE — Discharge Summary (Signed)
Obstetric Discharge Summary Reason for Admission: rupture of membranes Prenatal Procedures: none Intrapartum Procedures: spontaneous vaginal delivery Postpartum Procedures: none Complications-Operative and Postpartum: none Eating, drinking, voiding, ambulating well.  +flatus and bm.  Lochia and pain wnl.  No complaints.  Hemoglobin  Date Value Range Status  05/30/2012 11.4* 12.0 - 15.0 g/dL Final     HCT  Date Value Range Status  05/30/2012 33.8* 36.0 - 46.0 % Final    Physical Exam:  General: alert, cooperative and no distress Lochia: appropriate Uterine Fundus: firm Incision: n/a DVT Evaluation: No evidence of DVT seen on physical exam. Negative Homan's sign. No cords or calf tenderness. No significant calf/ankle edema.  Discharge Diagnoses: Term Pregnancy-delivered  Discharge Information: Date: 06/01/2012 Activity: pelvic rest Diet: routine Medications: PNV and Ibuprofen Condition: stable Instructions: refer to practice specific booklet Discharge to: home Follow-up Information    Schedule an appointment as soon as possible for a visit with [facility]. (4-6 weeks for your postpartum check)    Contact information:   [address] [place] [address] [phone]         Newborn Data: Live born female  Birth Weight: 6 lb 10 oz (3005 g) APGAR: 8, 9  Home with mother. Breastfeeding, wants micronor for contraception, OP circumcision  Marge Duncans 06/01/2012, 7:33 AM

## 2012-06-01 NOTE — Progress Notes (Signed)
Seen and agree with note Keelan Pomerleau CNM 

## 2012-06-01 NOTE — Clinical Social Work Note (Signed)
CSW consulted with MOB.  No barriers to discharge at this time. MOB is living in hotel while looking into other housing and reports no concerns with living environment. Full consult report to follow.    [phone] 

## 2012-06-02 LAB — TYPE AND SCREEN
Antibody Screen: NEGATIVE
Unit division: 0

## 2012-06-02 NOTE — Discharge Summary (Signed)
Agree with above note.  Montoya Brandel H. 06/02/2012 11:53 AM  

## 2012-06-02 NOTE — H&P (Signed)
Attestation of Attending Supervision of Advanced Practitioner (CNM/NP): Evaluation and management procedures were performed by the Advanced Practitioner under my supervision and collaboration.  I have reviewed the Advanced Practitioner's note and chart, and I agree with the management and plan.  Tujuana Kilmartin 06/02/2012 2:24 PM

## 2012-06-02 NOTE — Progress Notes (Signed)
Post discharge chart review completed.  

## 2012-06-09 ENCOUNTER — Telehealth: Payer: Self-pay | Admitting: *Deleted

## 2012-06-09 DIAGNOSIS — N390 Urinary tract infection, site not specified: Secondary | ICD-10-CM

## 2012-06-09 MED ORDER — PHENAZOPYRIDINE HCL 200 MG PO TABS: 200.0000 mg | ORAL_TABLET | Freq: Three times a day (TID) | ORAL | Status: DC | PRN

## 2012-06-09 MED ORDER — SULFAMETHOXAZOLE-TRIMETHOPRIM 800-160 MG PO TABS: 1.0000 | ORAL_TABLET | Freq: Two times a day (BID) | ORAL | Status: DC

## 2012-06-09 NOTE — Telephone Encounter (Signed)
Patient left a message stating that she has uti symptoms. Per Gyn Protocol  Septra DS 1 tablet PO BID x 3days sent to pharmacy. Advised patient to call back if symptoms not resolved.

## 2012-06-11 ENCOUNTER — Ambulatory Visit: Payer: Medicaid Other

## 2012-06-18 ENCOUNTER — Ambulatory Visit: Payer: Medicaid Other

## 2012-06-18 NOTE — Progress Notes (Signed)
Patient is here for followup.  She reports amenorrhea.  UPT in clinic is positive.  Patient was given information on how to start care at [facility] Department.   Total encounter time: 10 minutes

## 2012-07-04 ENCOUNTER — Ambulatory Visit (INDEPENDENT_AMBULATORY_CARE_PROVIDER_SITE_OTHER): Payer: Medicaid Other | Admitting: Medical

## 2012-07-04 ENCOUNTER — Encounter: Payer: Self-pay | Admitting: Medical

## 2012-07-04 VITALS — BP 119/82 | HR 83 | Temp 97.5°F | Wt 235.0 lb

## 2012-07-04 DIAGNOSIS — Z3490 Encounter for supervision of normal pregnancy, unspecified, unspecified trimester: Secondary | ICD-10-CM

## 2012-07-04 DIAGNOSIS — Z309 Encounter for contraceptive management, unspecified: Secondary | ICD-10-CM

## 2012-07-04 DIAGNOSIS — IMO0001 Reserved for inherently not codable concepts without codable children: Secondary | ICD-10-CM

## 2012-07-04 MED ORDER — NORETHINDRONE 0.35 MG PO TABS: 1.0000 | ORAL_TABLET | Freq: Every day | ORAL | Status: DC

## 2012-07-04 NOTE — Progress Notes (Signed)
Subjective:     Patient ID: Melissa [name], female   DOB: 26-Jul-1975, 37 y.o.   MRN: [id number]  HPI Melissa [name] is a 37 yo female who presents to the clinic for her post-partum visit. She had SROM and a vaginal delivery on Jan. 17 and delivered a baby boy. She has been doing well post-partum. She does report that she has been very tired due to lack of sleep, but otherwise has been feeling well. She reports that she just started her cycle this week, and reports a normal amount of vaginal bleeding with her period. She denies any abnormal vaginal discharge. She denies any abdominal pain, but reports mild cramping with her periods. She reports that she is no longer breast feeding, because her mild production was unable to keep up with the demands of baby, and only breast fed for about 2 weeks. Patient is using Gerber soy formula.  She is interested in getting the progestin-only OCP today. She reports that she has a uterine fibroid, and is interested in the progestin-only pill so that she does not have increased bleeding with an estrogen pill. She reports she has had a previous reaction to the Depo shot, so is not interested in getting it. She is also not interested in the [place].  She also requests the Shingles vaccine, and reports that she has had Shingles 14 times in the past. She was previously treated with Valtrex, but reports that she would like to have the shot today. Her last Pap was done in April 2013 and was normal. She has a history of an abnormal Pap, but reports she has had at least 3 normals results on her Pap since then.  Review of Systems  Constitutional: Positive for fatigue.  Cardiovascular: Negative for leg swelling.  Gastrointestinal: Negative for nausea, vomiting and abdominal pain.  Genitourinary: Positive for vaginal bleeding (currently on her period). Negative for dysuria, urgency, frequency, vaginal discharge and pelvic pain.  Neurological: Negative for headaches.        Objective:   Physical Exam  General appearance - alert, well appearing, and in no distress and oriented to person, place, and time Chest - clear to auscultation, no wheezes, rales or rhonchi, symmetric air entry Heart - normal rate, regular rhythm, normal S1, S2, no murmurs, rubs, clicks or gallops Abdomen - soft, nontender, nondistended, no masses or organomegaly Pelvic - VULVA: normal appearing vulva with no masses, tenderness or lesions, VAGINA: normal appearing vagina with normal, no lesions, vaginal discharge - bloody, CERVIX: normal appearing cervix without lesions, cervical discharge present - bloody, UTERUS: uterus is normal size, shape, consistency and nontender, ADNEXA: normal adnexa in size, nontender and no masses Extremities - peripheral pulses normal, no pedal edema, no clubbing or cyanosis  Assessment:     1. Post-partum visit- doing well, no complications with labor, no complications since delivery 2. Contraception- progestin-only OCPs  3. Shingles vaccine    Plan:     1. Post-partum visit- no complications 2. Contraception- Mirco-nor OCPs prescribed  3. Shingles vaccine- unavailable today. Pharmacy will plan to order prior to next visit 4. Patient is due for an annual exam in April, 2014. Next pap is due April 2017.  5. Patient has been encouraged to find a PCP to establish primary care. They may also be able to give the shingles vaccine sooner.  6. Rx for Micronor sent to patient's pharmacy     I have seen and evaluated this patient with the PA student and added/edited the  above note to reflect my observations.  I agree with the assessment and plan as it is written above

## 2012-07-04 NOTE — Patient Instructions (Signed)
Oral Contraception Use Oral contraceptives (OCs) are medicines taken to prevent pregnancy. OCs work by preventing the ovaries from releasing eggs. The hormones in OCs also cause the cervical mucus to thicken, preventing the sperm from entering the uterus. The hormones also cause the uterine lining to become thin, not allowing a fertilized egg to attach to the inside of the uterus. OCs are highly effective when taken exactly as prescribed. However, OCs do not prevent sexually transmitted diseases (STDs). Safe sex practices, such as using condoms along with an OC, can help prevent STDs.  Before taking OCs, you may have a physical exam and Pap test. Your caregiver may also order blood tests if necessary. Your caregiver will make sure you are a good candidate for oral contraception. Discuss with your caregiver the possible side effects of the OC you may be prescribed. When starting an OC, it can take 2 to 3 months for the body to adjust to the changes in hormone levels in your body.  HOW TO TAKE ORAL CONTRACEPTIVES Your caregiver may advise you on how to start taking the first cycle of OCs. Otherwise, you can:  Start on day 1 of your menstrual period. You will not need any backup contraceptive protection with this start time.  Start on the first Sunday after your menstrual period or the day you get your prescription. In these cases, you will need to use backup contraceptive protection for the first 7-day cycle. After you have started taking OCs:  If you forget to take 1 pill, take it as soon as you remember. Take the next pill at the regular time.  If you miss 2 or more pills, use backup birth control until your next menstrual period starts.  If you use a 28-day pack that contains inactive pills and you miss 1 of the last 7 pills (pills with no hormones), it will not matter. Throw away the rest of the non-hormone pills and start a new pill pack. No matter which day you start the OC, you will always start  a new pack on that same day of the week. Have an extra pack of OCs and a backup contraceptive method available in case you miss some pills or lose your OC pack. HOME CARE INSTRUCTIONS   Do not smoke.  Always use a condom to protect against STDs. OCs do not protect against STDs.  Use a calendar to mark your menstrual period days.  Read the information and directions that come with your OC. Talk to your caregiver if you have questions. SEEK MEDICAL CARE IF:   You develop nausea and vomiting.  You have abnormal vaginal discharge or bleeding.  You develop a rash.  You miss your menstrual period.  You are losing your hair.  You need treatment for mood swings or depression.  You get dizzy when taking the OC.  You develop acne from taking the OC.  You become pregnant. SEEK IMMEDIATE MEDICAL CARE IF:   You develop chest pain.  You develop shortness of breath.  You have an uncontrolled or severe headache.  You develop numbness or slurred speech.  You develop visual problems.  You develop pain, redness, and swelling in the legs. Document Released: 04/19/2011 Document Revised: 07/23/2011 Document Reviewed: 04/19/2011 Exit[employer] Patient Information 2013 [employer].  

## 2012-09-23 ENCOUNTER — Other Ambulatory Visit (INDEPENDENT_AMBULATORY_CARE_PROVIDER_SITE_OTHER): Payer: Medicaid Other

## 2012-09-23 DIAGNOSIS — Z3202 Encounter for pregnancy test, result negative: Secondary | ICD-10-CM

## 2012-09-24 LAB — POCT URINE PREGNANCY: Preg Test, Ur: NEGATIVE

## 2012-09-24 NOTE — Progress Notes (Signed)
Patient had urine pregnancy test done in clinic on 09/23/12.  The results came back negative for pregnancy.

## 2012-10-13 ENCOUNTER — Ambulatory Visit: Payer: Medicaid Other | Admitting: Obstetrics and Gynecology

## 2012-10-13 ENCOUNTER — Telehealth: Payer: Self-pay | Admitting: Obstetrics and Gynecology

## 2012-10-13 NOTE — Telephone Encounter (Signed)
Patient wanted to do an HCG blood test. Appt made for her to come in tomorrow 10/14/12 @ 0915.

## 2012-10-14 ENCOUNTER — Other Ambulatory Visit: Payer: Medicaid Other

## 2012-10-14 DIAGNOSIS — Z3200 Encounter for pregnancy test, result unknown: Secondary | ICD-10-CM

## 2012-10-15 LAB — HCG, QUANTITATIVE, PREGNANCY: hCG, Beta Chain, Quant, S: 35766 m[IU]/mL

## 2012-10-20 ENCOUNTER — Telehealth: Payer: Self-pay | Admitting: *Deleted

## 2012-10-20 NOTE — Telephone Encounter (Signed)
Message copied by Mannie Stabile on Mon Oct 20, 2012  8:41 AM ------      Message from: Adam Phenix      Created: Sat Oct 18, 2012  3:40 PM       Advise to schedule prenatal care ------

## 2012-10-24 ENCOUNTER — Encounter: Payer: Self-pay | Admitting: Obstetrics & Gynecology

## 2012-10-27 ENCOUNTER — Telehealth: Payer: Self-pay | Admitting: *Deleted

## 2012-10-27 NOTE — Telephone Encounter (Signed)
Patient called and stated that she had shingles under her eye. She wanted [place] to call her in something to take for it because shes had shingles many times before and knows that this is what she has. I spoke with ZOXWRUE and she stated that patient needs to go to Emergency Room at [facility] [place] as this could be a serious condition that can cause blindness. I informed patient of the recommendations and she said she didn't understand why we couldn't just call it in. I reiterated that this was very serious as it is near her eye. I encouraged her to go to the ER for treatment asap. Pt stated thankyou and hung up the phone.

## 2012-10-28 ENCOUNTER — Encounter: Payer: Self-pay | Admitting: Obstetrics & Gynecology

## 2012-10-29 ENCOUNTER — Emergency Department (HOSPITAL_COMMUNITY)
Admission: EM | Admit: 2012-10-29 | Discharge: 2012-10-29 | Disposition: A | Discharge status: Home / Self Care | Payer: Medicaid Other | Attending: Emergency Medicine | Admitting: Emergency Medicine

## 2012-10-29 ENCOUNTER — Encounter (HOSPITAL_COMMUNITY): Payer: Self-pay

## 2012-10-29 DIAGNOSIS — Z8742 Personal history of other diseases of the female genital tract: Secondary | ICD-10-CM | POA: Insufficient documentation

## 2012-10-29 DIAGNOSIS — Z8559 Personal history of malignant neoplasm of other urinary tract organ: Secondary | ICD-10-CM | POA: Insufficient documentation

## 2012-10-29 DIAGNOSIS — Z862 Personal history of diseases of the blood and blood-forming organs and certain disorders involving the immune mechanism: Secondary | ICD-10-CM | POA: Insufficient documentation

## 2012-10-29 DIAGNOSIS — O98519 Other viral diseases complicating pregnancy, unspecified trimester: Secondary | ICD-10-CM | POA: Insufficient documentation

## 2012-10-29 DIAGNOSIS — O9989 Other specified diseases and conditions complicating pregnancy, childbirth and the puerperium: Secondary | ICD-10-CM | POA: Insufficient documentation

## 2012-10-29 DIAGNOSIS — B0229 Other postherpetic nervous system involvement: Secondary | ICD-10-CM

## 2012-10-29 DIAGNOSIS — R509 Fever, unspecified: Secondary | ICD-10-CM | POA: Insufficient documentation

## 2012-10-29 DIAGNOSIS — Z3490 Encounter for supervision of normal pregnancy, unspecified, unspecified trimester: Secondary | ICD-10-CM

## 2012-10-29 DIAGNOSIS — R51 Headache: Secondary | ICD-10-CM | POA: Insufficient documentation

## 2012-10-29 DIAGNOSIS — R21 Rash and other nonspecific skin eruption: Secondary | ICD-10-CM | POA: Insufficient documentation

## 2012-10-29 DIAGNOSIS — Z8639 Personal history of other endocrine, nutritional and metabolic disease: Secondary | ICD-10-CM | POA: Insufficient documentation

## 2012-10-29 MED ORDER — ACYCLOVIR 200 MG/5ML PO SUSP: 400.0000 mg | Freq: Every day | ORAL | Status: AC

## 2012-10-29 MED ORDER — FLUORESCEIN SODIUM 1 MG OP STRP
1.0000 | ORAL_STRIP | Freq: Once | OPHTHALMIC | Status: AC
  Administered 2012-10-29: 1 via OPHTHALMIC
  Filled 2012-10-29: qty 1

## 2012-10-29 MED ORDER — PROPARACAINE HCL 0.5 % OP SOLN
1.0000 [drp] | Freq: Once | OPHTHALMIC | Status: AC
  Administered 2012-10-29: 1 [drp] via OPHTHALMIC
  Filled 2012-10-29: qty 15

## 2012-10-29 MED ORDER — ACYCLOVIR 200 MG/5ML PO SUSP
400.0000 mg | Freq: Every day | ORAL | Status: DC
  Administered 2012-10-29: 400 mg via ORAL
  Filled 2012-10-29 (×3): qty 10

## 2012-10-29 MED ORDER — HYDROCODONE-ACETAMINOPHEN 5-325 MG PO TABS: 1.0000 | ORAL_TABLET | Freq: Once | ORAL | Status: DC

## 2012-10-29 NOTE — ED Notes (Signed)
Bed:WA12<BR> Expected date:<BR> Expected time:<BR> Means of arrival:<BR> Comments:<BR> Hold for triage

## 2012-10-29 NOTE — Progress Notes (Signed)
Pt's pcp listed on her medicaid card for 2014 indicates [facility]

## 2012-10-29 NOTE — ED Notes (Signed)
Per EMS pt c/o shingles to rt cheek, states is 2 months pregnant.

## 2012-10-29 NOTE — ED Notes (Signed)
MD at bedside. 

## 2012-10-29 NOTE — ED Notes (Signed)
Pt state her OBGYN told her to come to the ED cause she is 2mon preg, pt c/o severe pain/headache

## 2012-10-29 NOTE — ED Provider Notes (Signed)
Medical screening examination/treatment/procedure(s) were performed by non-physician practitioner and as supervising physician I was immediately available for consultation/collaboration.    Birtha Hatler D Owais Pruett, MD 10/29/12 2327 

## 2012-10-29 NOTE — ED Provider Notes (Signed)
History     CSN: [id number]  Arrival date & time 10/29/12  1540   First MD Initiated Contact with Patient 10/29/12 1620      Chief Complaint  Patient presents with  . Herpes Zoster    (Consider location/radiation/quality/duration/timing/severity/associated sxs/prior treatment) HPI  37 year old female who is 2 months pregnant was sent here from the OB/GYN office for evaluations of shingle on the right cheek. Patient has history of shingles in the past. States this is the 18th episode. The symptoms first began 4 days ago when she developed a sharp throbbing burning headache. Headache started from the back of her head which radiates to the front. Headache was persistent, not improve after taking Tylenol. Two days ago she noticed a rash from her right ear which radiates to her right cheek. Describe rash is a stabbing burning itching sensation that felt very similar to prior shingle episode. She  endorsed a fever of 102 several days ago.  She denies vision changes, pain with eye movement, nose pain, sore throat, chest pain, shortness of breath, productive cough, abdominal pain, nausea, vomiting, diarrhea, or rash anywhere else. She has tried to pour chlorox and peroxide on the lesion which provide some improvement. She went to her OB/GYN today for evaluation and was sent here to the ER for further management.  Past Medical History  Diagnosis Date  . Abnormal Pap smear 08/30/2008    LGSIL  . Infertility     Secondary PCOS  . PCOS (polycystic ovarian syndrome)   . Chlamydia   . Fibroid   . Shingles outbreak 11/2011    Past Surgical History  Procedure Laterality Date  . Wisdom tooth extraction    . Right ear surgery    . Laparoscopy  07/05/2011    Procedure: LAPAROSCOPY OPERATIVE;  Surgeon: Tereso Newcomer, MD;  Location: [facility];  Service: Gynecology;  Laterality: N/A;  . Ear tube removal    . Dilation and curettage of uterus  06/2011    ectopic    Family History  Problem Relation  Age of Onset  . Anesthesia problems Neg Hx   . Diabetes Mother   . Hypertension Mother   . Cancer Father   . Fibroids Sister   . Hypertension Sister   . Crohn's disease Brother   . Cancer Brother   . Diabetes Brother   . Hypertension Brother   . Kidney disease Brother     History  Substance Use Topics  . Smoking status: Never Smoker   . Smokeless tobacco: Never Used  . Alcohol Use: No    OB History   Grav Para Term Preterm Abortions TAB SAB Ect Mult Living   4 2 2  0 2  1 1  0 2      Review of Systems  All other systems reviewed and are negative.    Allergies  Spinach  Home Medications   Current Outpatient Rx  Name  Route  Sig  Dispense  Refill  . acetaminophen (TYLENOL) 500 MG tablet   Oral   Take 500 mg by mouth every 6 (six) hours as needed.         . Prenatal Vit-Fe Fumarate-FA (PRENATAL MULTIVITAMIN) TABS   Oral   Take 1 tablet by mouth daily.           BP 127/69  Pulse 89  Temp(Src) 100 F (37.8 C) (Oral)  Resp 20  SpO2 100%  Physical Exam  Nursing note and vitals reviewed. Constitutional: She is oriented to  person, place, and time. She appears well-developed and well-nourished.  Moderately obese.  HENT:  Head: Normocephalic and atraumatic.  Left Ear: External ear normal.  Nose: Nose normal.  Mouth/Throat: Oropharynx is clear and moist. No oropharyngeal exudate.  Vesicular lesion to R cheek, ttp, no pustular exudates.   R ear with erythematous TM, no edema noted to ear canal.    Eyes: Conjunctivae, EOM and lids are normal. Pupils are equal, round, and reactive to light. No foreign bodies found. Right eye exhibits no chemosis, no discharge, no exudate and no hordeolum. No foreign body present in the right eye. Left eye exhibits no chemosis, no discharge, no exudate and no hordeolum. No foreign body present in the left eye. Right conjunctiva is not injected. Left conjunctiva is not injected. No scleral icterus.  Fundoscopic exam:      The  right eye shows no hemorrhage and no papilledema.  Slit lamp exam:      The right eye shows no corneal abrasion, no corneal flare, no corneal ulcer and no fluorescein uptake.  I with normal appearance, extraocular movement intact, Funduscopic exam unremarkable  Neck: Normal range of motion. Neck supple. No JVD present.  Cardiovascular: Normal rate and regular rhythm.   Pulmonary/Chest: Effort normal and breath sounds normal. She has no wheezes. She has no rales. She exhibits no tenderness.  Abdominal: Soft. There is no tenderness.  Musculoskeletal: Normal range of motion. She exhibits no edema and no tenderness.  Neurological: She is alert and oriented to person, place, and time.  Skin: Skin is warm. No rash noted.  Psychiatric: She has a normal mood and affect.    ED Course  Procedures (including critical care time)  Pt with shingle rash on R cheek on face.  This is her 18th episodes.  Sts valtrex or acyclovir works well in the past.  Pt request for liquid medication because she has difficulty swallowing her pills.  Will order acyclovir suspension.  No ocular involvement, no Hutchinsons' sign.  5:54 PM Pt to f/u with OBGYN and also with opthalmologist for further management of her shingle infection.  Will d/c with acyclovir.  Care discussed with attending.  Pt stable for discharge.    Labs Reviewed - No data to display No results found.   1. Herpes zoster virus infection of face and ear nerves   2. Pregnancy       MDM  BP 148/81  Pulse 65  Temp(Src) 100 F (37.8 C) (Oral)  Resp 16  SpO2 100%  I have reviewed nursing notes and vital signs.  I reviewed available ER/hospitalization records thought the EMR         Fayrene Helper, [place] 10/29/12 1755

## 2012-11-19 ENCOUNTER — Other Ambulatory Visit: Payer: Medicaid Other

## 2012-11-19 DIAGNOSIS — Z3201 Encounter for pregnancy test, result positive: Secondary | ICD-10-CM

## 2012-11-19 LAB — POCT URINALYSIS DIP (DEVICE)
Bilirubin Urine: NEGATIVE
Glucose, UA: NEGATIVE mg/dL
Hgb urine dipstick: NEGATIVE
Leukocytes, UA: NEGATIVE
Nitrite: NEGATIVE

## 2012-11-20 ENCOUNTER — Telehealth: Payer: Self-pay

## 2012-11-20 LAB — OBSTETRIC PANEL
Basophils Absolute: 0 10*3/uL (ref 0.0–0.1)
Eosinophils Relative: 3 % (ref 0–5)
Hemoglobin: 12.8 g/dL (ref 12.0–15.0)
Hepatitis B Surface Ag: NEGATIVE
Lymphs Abs: 2.2 10*3/uL (ref 0.7–4.0)
MCHC: 33.9 g/dL (ref 30.0–36.0)
Monocytes Relative: 9 % (ref 3–12)
Neutro Abs: 2.8 10*3/uL (ref 1.7–7.7)
Neutrophils Relative %: 50 % (ref 43–77)
Platelets: 351 10*3/uL (ref 150–400)
RBC: 4.55 MIL/uL (ref 3.87–5.11)
Rh Type: POSITIVE
WBC: 5.6 10*3/uL (ref 4.0–10.5)

## 2012-11-20 MED ORDER — PRENATAL VITAMINS PLUS 27-1 MG PO TABS: 1.0000 | ORAL_TABLET | Freq: Every day | ORAL | Status: DC

## 2012-11-20 NOTE — Telephone Encounter (Signed)
Pt called and stated that she wanted have PNV ordered.   Called pt and informed her that a PNV Rx has been sent to her [facility].  Pt stated understanding with no further questions.

## 2012-11-26 ENCOUNTER — Encounter: Payer: Medicaid Other | Admitting: Obstetrics and Gynecology

## 2012-12-03 ENCOUNTER — Encounter: Payer: Self-pay | Admitting: Family

## 2012-12-03 ENCOUNTER — Other Ambulatory Visit (HOSPITAL_COMMUNITY)
Admission: RE | Admit: 2012-12-03 | Discharge: 2012-12-03 | Disposition: A | Discharge status: Home / Self Care | Payer: Medicaid Other | Source: Ambulatory Visit | Attending: Family | Admitting: Family

## 2012-12-03 ENCOUNTER — Ambulatory Visit (INDEPENDENT_AMBULATORY_CARE_PROVIDER_SITE_OTHER): Payer: Medicaid Other | Admitting: Family

## 2012-12-03 VITALS — BP 117/76 | Temp 99.8°F | Wt 251.3 lb

## 2012-12-03 DIAGNOSIS — O3410 Maternal care for benign tumor of corpus uteri, unspecified trimester: Secondary | ICD-10-CM

## 2012-12-03 DIAGNOSIS — IMO0002 Reserved for concepts with insufficient information to code with codable children: Secondary | ICD-10-CM

## 2012-12-03 DIAGNOSIS — Z3492 Encounter for supervision of normal pregnancy, unspecified, second trimester: Secondary | ICD-10-CM | POA: Insufficient documentation

## 2012-12-03 DIAGNOSIS — Z113 Encounter for screening for infections with a predominantly sexual mode of transmission: Secondary | ICD-10-CM | POA: Insufficient documentation

## 2012-12-03 DIAGNOSIS — Z01419 Encounter for gynecological examination (general) (routine) without abnormal findings: Secondary | ICD-10-CM | POA: Insufficient documentation

## 2012-12-03 LAB — POCT URINALYSIS DIP (DEVICE)
Hgb urine dipstick: NEGATIVE
Nitrite: NEGATIVE
Specific Gravity, Urine: >=1.03 (ref 1.005–1.030)
Urobilinogen, UA: 0.2 mg/dL (ref 0.0–1.0)
pH: 5.5 (ref 5.0–8.0)

## 2012-12-03 NOTE — Addendum Note (Signed)
Addended by: Jill Side on: 12/03/2012 05:02 PM   Modules accepted: Orders

## 2012-12-03 NOTE — Progress Notes (Signed)
Pulse- 93 Patient voices great concern over fetal well being and would like an ultrasound today if possible

## 2012-12-03 NOTE — Progress Notes (Signed)
Pt seen in [place] on 11/28/12 with report of vaginal bleeding.  Diagnosed with threatened miscarriage.  No bleeding since.  Here for new OB appt.  Declines genetic screen.  Pap smear obtained today.   Exam   BP 117/76  Temp(Src) 99.8 F (37.7 C)  Wt 251 lb 4.8 oz (113.989 kg)  BMI 43.11 kg/m2  LMP 08/29/2012 Uterine Size: size equals dates  Pelvic Exam:    Perineum: No Hemorrhoids, Normal Perineum   Vulva: normal   Vagina:  normal mucosa, normal discharge, no palpable nodules   pH: Not done   Cervix: no bleeding following Pap, no cervical motion tenderness and no lesions   Adnexa: normal adnexa and no mass, fullness, tenderness   Bony Pelvis: Adequate  System: Breast:  No nipple retraction or dimpling, No nipple discharge or bleeding, No axillary or supraclavicular adenopathy, Normal to palpation without dominant masses   Skin: normal coloration and turgor, no rashes    Neurologic: negative   Extremities: normal strength, tone, and muscle mass   HEENT neck supple with midline trachea and thyroid without masses   Mouth/Teeth mucous membranes moist, pharynx normal without lesions   Neck supple and no masses   Cardiovascular: regular rate and rhythm, no murmurs or gallops   Respiratory:  appears well, vitals normal, no respiratory distress, acyanotic, normal RR, neck free of mass or lymphadenopathy, chest clear, no wheezing, crepitations, rhonchi, normal symmetric air entry   Abdomen: soft, non-tender; bowel sounds normal; no masses,  no organomegaly   Urinary: urethral meatus normal

## 2012-12-06 ENCOUNTER — Encounter: Payer: Self-pay | Admitting: Family

## 2012-12-22 ENCOUNTER — Telehealth: Payer: Self-pay | Admitting: *Deleted

## 2012-12-22 DIAGNOSIS — O210 Mild hyperemesis gravidarum: Secondary | ICD-10-CM

## 2012-12-22 MED ORDER — PROMETHAZINE HCL 12.5 MG PO TABS: 12.5000 mg | ORAL_TABLET | Freq: Four times a day (QID) | ORAL | Status: DC | PRN

## 2012-12-22 NOTE — Telephone Encounter (Signed)
Discussed with Dr. Debroah Loop and he approved phenegan 12. 5 mg tablets. Called [place] and left a message we got her message and have sent the medicine she requested to her pharmacy on file. Please call if you have any further issues.

## 2012-12-22 NOTE — Telephone Encounter (Signed)
Melissa [name] left a  Message stating she has been having morning sickness/vomiting for the last few days and it is making her feel weak.  States she didn't know if they could give her phenergan so she could keep her food down.  Request a call

## 2012-12-31 ENCOUNTER — Ambulatory Visit (INDEPENDENT_AMBULATORY_CARE_PROVIDER_SITE_OTHER): Payer: Medicaid Other | Admitting: Family

## 2012-12-31 VITALS — BP 129/78 | Temp 99.3°F | Wt 252.3 lb

## 2012-12-31 DIAGNOSIS — O3410 Maternal care for benign tumor of corpus uteri, unspecified trimester: Secondary | ICD-10-CM

## 2012-12-31 DIAGNOSIS — Z3492 Encounter for supervision of normal pregnancy, unspecified, second trimester: Secondary | ICD-10-CM

## 2012-12-31 LAB — POCT URINALYSIS DIP (DEVICE)
Ketones, ur: NEGATIVE mg/dL
Leukocytes, UA: NEGATIVE
Protein, ur: NEGATIVE mg/dL
Specific Gravity, Urine: 1.025 (ref 1.005–1.030)

## 2012-12-31 LAB — GLUCOSE TOLERANCE, 1 HOUR (50G) W/O FASTING: Glucose, 1 Hour GTT: 106 mg/dL (ref 70–140)

## 2012-12-31 NOTE — Progress Notes (Signed)
Pulse: 86 Early 1 hr today

## 2012-12-31 NOTE — Progress Notes (Signed)
Doing well; no bleeding; schedule anatomy US.

## 2013-01-02 ENCOUNTER — Encounter: Payer: Self-pay | Admitting: Family

## 2013-01-02 ENCOUNTER — Ambulatory Visit (HOSPITAL_COMMUNITY)
Admission: RE | Admit: 2013-01-02 | Discharge: 2013-01-02 | Disposition: A | Discharge status: Home / Self Care | Payer: Medicaid Other | Source: Ambulatory Visit | Attending: Family | Admitting: Family

## 2013-01-02 DIAGNOSIS — Z363 Encounter for antenatal screening for malformations: Secondary | ICD-10-CM | POA: Insufficient documentation

## 2013-01-02 DIAGNOSIS — Z1389 Encounter for screening for other disorder: Secondary | ICD-10-CM | POA: Insufficient documentation

## 2013-01-02 DIAGNOSIS — Z3492 Encounter for supervision of normal pregnancy, unspecified, second trimester: Secondary | ICD-10-CM

## 2013-01-02 DIAGNOSIS — O358XX0 Maternal care for other (suspected) fetal abnormality and damage, not applicable or unspecified: Secondary | ICD-10-CM | POA: Insufficient documentation

## 2013-01-07 ENCOUNTER — Telehealth: Payer: Self-pay | Admitting: Obstetrics and Gynecology

## 2013-01-07 NOTE — Telephone Encounter (Signed)
Patient called requesting what to take for cold symptoms. Per protocol advised that she can take vicks 44, mucinex DM, robitussin plain/dm and tylenol sinus. Patient agrees and satisfied.

## 2013-01-28 ENCOUNTER — Encounter: Payer: Medicaid Other | Admitting: Family Medicine

## 2013-02-04 ENCOUNTER — Ambulatory Visit (INDEPENDENT_AMBULATORY_CARE_PROVIDER_SITE_OTHER): Payer: Medicaid Other | Admitting: Family

## 2013-02-04 VITALS — BP 116/71 | Temp 98.9°F | Wt 248.5 lb

## 2013-02-04 DIAGNOSIS — O3410 Maternal care for benign tumor of corpus uteri, unspecified trimester: Secondary | ICD-10-CM

## 2013-02-04 DIAGNOSIS — Z3492 Encounter for supervision of normal pregnancy, unspecified, second trimester: Secondary | ICD-10-CM

## 2013-02-04 LAB — POCT URINALYSIS DIP (DEVICE)
Glucose, UA: NEGATIVE mg/dL
Hgb urine dipstick: NEGATIVE
Nitrite: NEGATIVE
Specific Gravity, Urine: >=1.03 (ref 1.005–1.030)

## 2013-02-04 MED ORDER — CONCEPT DHA 53.5-38-1 MG PO CAPS: 1.0000 | ORAL_CAPSULE | Freq: Every day | ORAL | Status: DC

## 2013-02-04 NOTE — Progress Notes (Signed)
Reports feeling dizziness last week, felt better when taking vitamins, new RX for [employer].  Reviewed [place] results.

## 2013-02-04 NOTE — Progress Notes (Signed)
P=88 

## 2013-03-04 ENCOUNTER — Ambulatory Visit (INDEPENDENT_AMBULATORY_CARE_PROVIDER_SITE_OTHER): Payer: Medicaid Other | Admitting: Advanced Practice Midwife

## 2013-03-04 ENCOUNTER — Encounter: Payer: Self-pay | Admitting: *Deleted

## 2013-03-04 VITALS — BP 127/80 | Temp 97.2°F | Wt 243.8 lb

## 2013-03-04 DIAGNOSIS — R634 Abnormal weight loss: Secondary | ICD-10-CM

## 2013-03-04 DIAGNOSIS — O3410 Maternal care for benign tumor of corpus uteri, unspecified trimester: Secondary | ICD-10-CM

## 2013-03-04 LAB — POCT URINALYSIS DIP (DEVICE)
Glucose, UA: NEGATIVE mg/dL
Hgb urine dipstick: NEGATIVE
Ketones, ur: 15 mg/dL — AB
Leukocytes, UA: NEGATIVE
pH: 6 (ref 5.0–8.0)

## 2013-03-04 LAB — TSH: TSH: 1.013 u[IU]/mL (ref 0.350–4.500)

## 2013-03-04 MED ORDER — ENSURE COMPLETE PO LIQD: 237.0000 mL | Freq: Two times a day (BID) | ORAL | Status: DC

## 2013-03-04 NOTE — Progress Notes (Signed)
Pulse- 95 Patient continues to lose weight since 8/20 visit- reports not much of an appetite for anything, just eating things like chips & crackers; requests Rx for ensure for insurance coverage; saw nutritionist on 10/16 at [facility] office, but states they were not much help; reports pelvic and vaginal pressure; reports watery d/c and is concerned for possible rupture of membranes

## 2013-03-10 NOTE — Progress Notes (Signed)
Good fetal movement, denies vaginal bleeding, LOF, regular contractions. Reports decreased appetite and weight loss this pregnancy.  She also has heat/cold sensitivity and sometimes feels like her heart rate is fast.  Denies palpitations, chest pain, fever/chills, or vomiting.  Pt reports watery discharge x2 weeks but declines exam to evaluate for rupture or infection.  PPROM/PTL precautions given. TSH ordered, diet reviewed.  Ensure prescribed and encouraged to supplement diet.

## 2013-03-18 ENCOUNTER — Encounter: Payer: Medicaid Other | Admitting: Advanced Practice Midwife

## 2013-03-25 ENCOUNTER — Ambulatory Visit (INDEPENDENT_AMBULATORY_CARE_PROVIDER_SITE_OTHER): Payer: Medicaid Other | Admitting: Advanced Practice Midwife

## 2013-03-25 VITALS — BP 117/72 | Temp 98.6°F | Wt 243.0 lb

## 2013-03-25 DIAGNOSIS — O3410 Maternal care for benign tumor of corpus uteri, unspecified trimester: Secondary | ICD-10-CM

## 2013-03-25 DIAGNOSIS — Z3492 Encounter for supervision of normal pregnancy, unspecified, second trimester: Secondary | ICD-10-CM

## 2013-03-25 LAB — POCT URINALYSIS DIP (DEVICE)
Glucose, UA: NEGATIVE mg/dL
Leukocytes, UA: NEGATIVE
Specific Gravity, Urine: >=1.03 (ref 1.005–1.030)
Urobilinogen, UA: 1 mg/dL (ref 0.0–1.0)
pH: 6 (ref 5.0–8.0)

## 2013-03-25 LAB — GLUCOSE TOLERANCE, 1 HOUR (50G) W/O FASTING: Glucose, 1 Hour GTT: 108 mg/dL (ref 70–140)

## 2013-03-25 LAB — CBC
HCT: 30.4 % — ABNORMAL LOW (ref 36.0–46.0)
Platelets: 284 10*3/uL (ref 150–400)
RBC: 3.62 MIL/uL — ABNORMAL LOW (ref 3.87–5.11)
RDW: 14.2 % (ref 11.5–15.5)
WBC: 6 10*3/uL (ref 4.0–10.5)

## 2013-03-25 MED ORDER — METOCLOPRAMIDE HCL 10 MG PO TABS: 10.0000 mg | ORAL_TABLET | Freq: Four times a day (QID) | ORAL | Status: DC

## 2013-03-25 NOTE — Progress Notes (Signed)
Doing well. Glucola today. Plans Depo for contraception

## 2013-03-25 NOTE — Patient Instructions (Signed)
Third Trimester of Pregnancy  The third trimester is from week 29 through week 42, months 7 through 9. The third trimester is a time when the fetus is growing rapidly. At the end of the ninth month, the fetus is about 20 inches in length and weighs 6 10 pounds.   BODY CHANGES  Your body goes through many changes during pregnancy. The changes vary from woman to woman.    Your weight will continue to increase. You can expect to gain 25 35 pounds (11 16 kg) by the end of the pregnancy.   You may begin to get stretch marks on your hips, abdomen, and breasts.   You may urinate more often because the fetus is moving lower into your pelvis and pressing on your bladder.   You may develop or continue to have heartburn as a result of your pregnancy.   You may develop constipation because certain hormones are causing the muscles that push waste through your intestines to slow down.   You may develop hemorrhoids or swollen, bulging veins (varicose veins).   You may have pelvic pain because of the weight gain and pregnancy hormones relaxing your joints between the bones in your pelvis. Back aches may result from over exertion of the muscles supporting your posture.   Your breasts will continue to grow and be tender. A yellow discharge may leak from your breasts called colostrum.   Your belly button may stick out.   You may feel short of breath because of your expanding uterus.   You may notice the fetus "dropping," or moving lower in your abdomen.   You may have a bloody mucus discharge. This usually occurs a few days to a week before labor begins.   Your cervix becomes thin and soft (effaced) near your due date.  WHAT TO EXPECT AT YOUR PRENATAL EXAMS   You will have prenatal exams every 2 weeks until week 36. Then, you will have weekly prenatal exams. During a routine prenatal visit:   You will be weighed to make sure you and the fetus are growing normally.   Your blood pressure is taken.   Your abdomen will be  measured to track your baby's growth.   The fetal heartbeat will be listened to.   Any test results from the previous visit will be discussed.   You may have a cervical check near your due date to see if you have effaced.  At around 36 weeks, your caregiver will check your cervix. At the same time, your caregiver will also perform a test on the secretions of the vaginal tissue. This test is to determine if a type of bacteria, Group B streptococcus, is present. Your caregiver will explain this further.  Your caregiver may ask you:   What your birth plan is.   How you are feeling.   If you are feeling the baby move.   If you have had any abnormal symptoms, such as leaking fluid, bleeding, severe headaches, or abdominal cramping.   If you have any questions.  Other tests or screenings that may be performed during your third trimester include:   Blood tests that check for low iron levels (anemia).   Fetal testing to check the health, activity level, and growth of the fetus. Testing is done if you have certain medical conditions or if there are problems during the pregnancy.  FALSE LABOR  You may feel small, irregular contractions that eventually go away. These are called Braxton Hicks contractions, or   false labor. Contractions may last for hours, days, or even weeks before true labor sets in. If contractions come at regular intervals, intensify, or become painful, it is best to be seen by your caregiver.   SIGNS OF LABOR    Menstrual-like cramps.   Contractions that are 5 minutes apart or less.   Contractions that start on the top of the uterus and spread down to the lower abdomen and back.   A sense of increased pelvic pressure or back pain.   A watery or bloody mucus discharge that comes from the vagina.  If you have any of these signs before the 37th week of pregnancy, call your caregiver right away. You need to go to the hospital to get checked immediately.  HOME CARE INSTRUCTIONS    Avoid all  smoking, herbs, alcohol, and unprescribed drugs. These chemicals affect the formation and growth of the baby.   Follow your caregiver's instructions regarding medicine use. There are medicines that are either safe or unsafe to take during pregnancy.   Exercise only as directed by your caregiver. Experiencing uterine cramps is a good sign to stop exercising.   Continue to eat regular, healthy meals.   Wear a good support bra for breast tenderness.   Do not use hot tubs, steam rooms, or saunas.   Wear your seat belt at all times when driving.   Avoid raw meat, uncooked cheese, cat litter boxes, and soil used by cats. These carry germs that can cause birth defects in the baby.   Take your prenatal vitamins.   Try taking a stool softener (if your caregiver approves) if you develop constipation. Eat more high-fiber foods, such as fresh vegetables or fruit and whole grains. Drink plenty of fluids to keep your urine clear or pale yellow.   Take warm sitz baths to soothe any pain or discomfort caused by hemorrhoids. Use hemorrhoid cream if your caregiver approves.   If you develop varicose veins, wear support hose. Elevate your feet for 15 minutes, 3 4 times a day. Limit salt in your diet.   Avoid heavy lifting, wear low heal shoes, and practice good posture.   Rest a lot with your legs elevated if you have leg cramps or low back pain.   Visit your dentist if you have not gone during your pregnancy. Use a soft toothbrush to brush your teeth and be gentle when you floss.   A sexual relationship may be continued unless your caregiver directs you otherwise.   Do not travel far distances unless it is absolutely necessary and only with the approval of your caregiver.   Take prenatal classes to understand, practice, and ask questions about the labor and delivery.   Make a trial run to the hospital.   Pack your hospital bag.   Prepare the baby's nursery.   Continue to go to all your prenatal visits as directed  by your caregiver.  SEEK MEDICAL CARE IF:   You are unsure if you are in labor or if your water has broken.   You have dizziness.   You have mild pelvic cramps, pelvic pressure, or nagging pain in your abdominal area.   You have persistent nausea, vomiting, or diarrhea.   You have a bad smelling vaginal discharge.   You have pain with urination.  SEEK IMMEDIATE MEDICAL CARE IF:    You have a fever.   You are leaking fluid from your vagina.   You have spotting or bleeding from your vagina.     You have severe abdominal cramping or pain.   You have rapid weight loss or gain.   You have shortness of breath with chest pain.   You notice sudden or extreme swelling of your face, hands, ankles, feet, or legs.   You have not felt your baby move in over an hour.   You have severe headaches that do not go away with medicine.   You have vision changes.  Document Released: 04/24/2001 Document Revised: 12/31/2012 Document Reviewed: 07/01/2012  [employer] Patient Information 2014 [employer].

## 2013-03-25 NOTE — Progress Notes (Signed)
Pulse- 87  Pain/pressure-lower abd

## 2013-03-26 LAB — RPR

## 2013-03-26 LAB — HIV ANTIBODY (ROUTINE TESTING W REFLEX): HIV: NONREACTIVE

## 2013-03-31 ENCOUNTER — Encounter: Payer: Self-pay | Admitting: Advanced Practice Midwife

## 2013-04-08 ENCOUNTER — Encounter: Payer: Medicaid Other | Admitting: Advanced Practice Midwife

## 2013-04-15 ENCOUNTER — Encounter: Payer: Medicaid Other | Admitting: Advanced Practice Midwife

## 2013-04-15 ENCOUNTER — Other Ambulatory Visit: Payer: Self-pay

## 2013-04-15 ENCOUNTER — Telehealth: Payer: Self-pay

## 2013-04-15 DIAGNOSIS — O2243 Hemorrhoids in pregnancy, third trimester: Secondary | ICD-10-CM

## 2013-04-15 DIAGNOSIS — O210 Mild hyperemesis gravidarum: Secondary | ICD-10-CM

## 2013-04-15 MED ORDER — PROMETHAZINE HCL 12.5 MG PO TABS: 12.5000 mg | ORAL_TABLET | Freq: Four times a day (QID) | ORAL | Status: DC | PRN

## 2013-04-15 MED ORDER — PRAMOXINE HCL 1 % RE FOAM: 1.0000 "application " | Freq: Three times a day (TID) | RECTAL | Status: DC | PRN

## 2013-04-15 NOTE — Telephone Encounter (Signed)
Pt. Called stating she needs a refill of promethazine. Pt. Also requests something stronger than OTC hemorrhoid cream because she is very sore and sitz baths and OTC creams are not helping.  Spoke with Wynelle Bourgeois who agreed to refill patient's promethazine. Also agreed to order proctofoam for pt.'s hemorrhoids.   Called pt. And left message stating we sent two prescriptions to her pharmacy. If she has any questions or concerns call clinic.

## 2013-04-20 ENCOUNTER — Telehealth: Payer: Self-pay | Admitting: *Deleted

## 2013-04-20 NOTE — Telephone Encounter (Signed)
Patient called and stated that her hemorrhoids are terrible and she is having a lot of pain with them. She states that she can't even walk. She was prescribed Proctofoam but she can't afford it. She wanted to know if there is anything else that she can use. She has used multiple otc products without relief. Spoke with Dr. Jolayne Panther who informed me that proctofoam is the only prescription medication to use. She asked patient to consider talking with a general surgeon about them. Patient stated that she will consider it and let provider know tomorrow while she is in the clinic. Patient had no further questions and stated that she will followup tomorrow.

## 2013-04-21 ENCOUNTER — Ambulatory Visit (INDEPENDENT_AMBULATORY_CARE_PROVIDER_SITE_OTHER): Payer: Medicaid Other | Admitting: Advanced Practice Midwife

## 2013-04-21 ENCOUNTER — Telehealth: Payer: Self-pay | Admitting: *Deleted

## 2013-04-21 VITALS — BP 123/75 | Temp 97.6°F | Wt 237.9 lb

## 2013-04-21 DIAGNOSIS — R634 Abnormal weight loss: Secondary | ICD-10-CM

## 2013-04-21 DIAGNOSIS — Z3492 Encounter for supervision of normal pregnancy, unspecified, second trimester: Secondary | ICD-10-CM

## 2013-04-21 DIAGNOSIS — O3410 Maternal care for benign tumor of corpus uteri, unspecified trimester: Secondary | ICD-10-CM

## 2013-04-21 LAB — POCT URINALYSIS DIP (DEVICE)
Hgb urine dipstick: NEGATIVE
Ketones, ur: 40 mg/dL — AB
Specific Gravity, Urine: 1.025 (ref 1.005–1.030)
Urobilinogen, UA: 1 mg/dL (ref 0.0–1.0)
pH: 6.5 (ref 5.0–8.0)

## 2013-04-21 NOTE — Progress Notes (Signed)
14 lb weight loss since early pregnancy. Doesn't eat much some days. Denies depression.  Will check [place] for fetal weight. Lost weight with last baby too. All babies good weights. TSH normal

## 2013-04-21 NOTE — Patient Instructions (Signed)
Third Trimester of Pregnancy  The third trimester is from week 29 through week 42, months 7 through 9. The third trimester is a time when the fetus is growing rapidly. At the end of the ninth month, the fetus is about 20 inches in length and weighs 6 10 pounds.   BODY CHANGES  Your body goes through many changes during pregnancy. The changes vary from woman to woman.    Your weight will continue to increase. You can expect to gain 25 35 pounds (11 16 kg) by the end of the pregnancy.   You may begin to get stretch marks on your hips, abdomen, and breasts.   You may urinate more often because the fetus is moving lower into your pelvis and pressing on your bladder.   You may develop or continue to have heartburn as a result of your pregnancy.   You may develop constipation because certain hormones are causing the muscles that push waste through your intestines to slow down.   You may develop hemorrhoids or swollen, bulging veins (varicose veins).   You may have pelvic pain because of the weight gain and pregnancy hormones relaxing your joints between the bones in your pelvis. Back aches may result from over exertion of the muscles supporting your posture.   Your breasts will continue to grow and be tender. A yellow discharge may leak from your breasts called colostrum.   Your belly button may stick out.   You may feel short of breath because of your expanding uterus.   You may notice the fetus "dropping," or moving lower in your abdomen.   You may have a bloody mucus discharge. This usually occurs a few days to a week before labor begins.   Your cervix becomes thin and soft (effaced) near your due date.  WHAT TO EXPECT AT YOUR PRENATAL EXAMS   You will have prenatal exams every 2 weeks until week 36. Then, you will have weekly prenatal exams. During a routine prenatal visit:   You will be weighed to make sure you and the fetus are growing normally.   Your blood pressure is taken.   Your abdomen will be  measured to track your baby's growth.   The fetal heartbeat will be listened to.   Any test results from the previous visit will be discussed.   You may have a cervical check near your due date to see if you have effaced.  At around 36 weeks, your caregiver will check your cervix. At the same time, your caregiver will also perform a test on the secretions of the vaginal tissue. This test is to determine if a type of bacteria, Group B streptococcus, is present. Your caregiver will explain this further.  Your caregiver may ask you:   What your birth plan is.   How you are feeling.   If you are feeling the baby move.   If you have had any abnormal symptoms, such as leaking fluid, bleeding, severe headaches, or abdominal cramping.   If you have any questions.  Other tests or screenings that may be performed during your third trimester include:   Blood tests that check for low iron levels (anemia).   Fetal testing to check the health, activity level, and growth of the fetus. Testing is done if you have certain medical conditions or if there are problems during the pregnancy.  FALSE LABOR  You may feel small, irregular contractions that eventually go away. These are called Braxton Hicks contractions, or   false labor. Contractions may last for hours, days, or even weeks before true labor sets in. If contractions come at regular intervals, intensify, or become painful, it is best to be seen by your caregiver.   SIGNS OF LABOR    Menstrual-like cramps.   Contractions that are 5 minutes apart or less.   Contractions that start on the top of the uterus and spread down to the lower abdomen and back.   A sense of increased pelvic pressure or back pain.   A watery or bloody mucus discharge that comes from the vagina.  If you have any of these signs before the 37th week of pregnancy, call your caregiver right away. You need to go to the hospital to get checked immediately.  HOME CARE INSTRUCTIONS    Avoid all  smoking, herbs, alcohol, and unprescribed drugs. These chemicals affect the formation and growth of the baby.   Follow your caregiver's instructions regarding medicine use. There are medicines that are either safe or unsafe to take during pregnancy.   Exercise only as directed by your caregiver. Experiencing uterine cramps is a good sign to stop exercising.   Continue to eat regular, healthy meals.   Wear a good support bra for breast tenderness.   Do not use hot tubs, steam rooms, or saunas.   Wear your seat belt at all times when driving.   Avoid raw meat, uncooked cheese, cat litter boxes, and soil used by cats. These carry germs that can cause birth defects in the baby.   Take your prenatal vitamins.   Try taking a stool softener (if your caregiver approves) if you develop constipation. Eat more high-fiber foods, such as fresh vegetables or fruit and whole grains. Drink plenty of fluids to keep your urine clear or pale yellow.   Take warm sitz baths to soothe any pain or discomfort caused by hemorrhoids. Use hemorrhoid cream if your caregiver approves.   If you develop varicose veins, wear support hose. Elevate your feet for 15 minutes, 3 4 times a day. Limit salt in your diet.   Avoid heavy lifting, wear low heal shoes, and practice good posture.   Rest a lot with your legs elevated if you have leg cramps or low back pain.   Visit your dentist if you have not gone during your pregnancy. Use a soft toothbrush to brush your teeth and be gentle when you floss.   A sexual relationship may be continued unless your caregiver directs you otherwise.   Do not travel far distances unless it is absolutely necessary and only with the approval of your caregiver.   Take prenatal classes to understand, practice, and ask questions about the labor and delivery.   Make a trial run to the hospital.   Pack your hospital bag.   Prepare the baby's nursery.   Continue to go to all your prenatal visits as directed  by your caregiver.  SEEK MEDICAL CARE IF:   You are unsure if you are in labor or if your water has broken.   You have dizziness.   You have mild pelvic cramps, pelvic pressure, or nagging pain in your abdominal area.   You have persistent nausea, vomiting, or diarrhea.   You have a bad smelling vaginal discharge.   You have pain with urination.  SEEK IMMEDIATE MEDICAL CARE IF:    You have a fever.   You are leaking fluid from your vagina.   You have spotting or bleeding from your vagina.     You have severe abdominal cramping or pain.   You have rapid weight loss or gain.   You have shortness of breath with chest pain.   You notice sudden or extreme swelling of your face, hands, ankles, feet, or legs.   You have not felt your baby move in over an hour.   You have severe headaches that do not go away with medicine.   You have vision changes.  Document Released: 04/24/2001 Document Revised: 12/31/2012 Document Reviewed: 07/01/2012  [employer] Patient Information 2014 [employer].

## 2013-04-21 NOTE — Progress Notes (Signed)
P= 100 C/o of intermittent lower abdomina/pelvic pressure.

## 2013-04-21 NOTE — Telephone Encounter (Signed)
Pt called nurse line requesting prescription for Ensure?? For [facility] office.  This is in regard to her losing weight.  She provided fax number for [facility] office [phone].

## 2013-04-24 MED ORDER — ENSURE COMPLETE PO LIQD: 237.0000 mL | Freq: Two times a day (BID) | ORAL | Status: DC

## 2013-04-24 NOTE — Telephone Encounter (Signed)
Called pt and discussed her request. She states that she had not been able to get the Ensure last October when originally prescribed because it was not covered by [facility]. When she was at [facility] office this week she learned that she can get it through them but needs a printed Rx to be faxed. I printed original prescription and asked [place] CNM to sign which she agreed. I faxed Rx to [facility] # [phone] and informed pt. She voiced understanding.

## 2013-04-28 ENCOUNTER — Ambulatory Visit (HOSPITAL_COMMUNITY)
Admission: RE | Admit: 2013-04-28 | Discharge: 2013-04-28 | Disposition: A | Discharge status: Home / Self Care | Payer: Medicaid Other | Source: Ambulatory Visit | Attending: Advanced Practice Midwife | Admitting: Advanced Practice Midwife

## 2013-04-28 ENCOUNTER — Encounter: Payer: Self-pay | Admitting: Advanced Practice Midwife

## 2013-04-28 DIAGNOSIS — O99891 Other specified diseases and conditions complicating pregnancy: Secondary | ICD-10-CM | POA: Insufficient documentation

## 2013-04-28 DIAGNOSIS — Z3492 Encounter for supervision of normal pregnancy, unspecified, second trimester: Secondary | ICD-10-CM

## 2013-04-28 DIAGNOSIS — R634 Abnormal weight loss: Secondary | ICD-10-CM

## 2013-04-29 ENCOUNTER — Telehealth: Payer: Self-pay | Admitting: *Deleted

## 2013-04-29 MED ORDER — ENSURE COMPLETE PO LIQD: 237.0000 mL | Freq: Two times a day (BID) | ORAL | Status: DC

## 2013-04-29 NOTE — Telephone Encounter (Addendum)
Pt left message stating that the [facility] office did not receive the fax prescription for Ensure which was previously sent. Please resend to fax # [phone].  I called pt and informed her that I had received a fax confirmation for the prescription which was sent on 04/21/13.  I confirmed the fax number with her and faxed the Rx again. Fax confirmation was received from the fax machine. I also requested for the [facility] office to call me back and confirm receipt of Rx. 1500 -  No return call received from [facility] office yet to confirm receipt of the faxed Rx. Also I have been calling their number [phone] multiple times since faxing the Rx and getting no answer- unable to even leave a message. I called pt and left message for her explaining that she should come to the clinic to obtain the prescription and take it to her next [facility] appt in the event they have lost it or did not receive it.

## 2013-05-11 ENCOUNTER — Ambulatory Visit (INDEPENDENT_AMBULATORY_CARE_PROVIDER_SITE_OTHER): Payer: Medicaid Other | Admitting: Advanced Practice Midwife

## 2013-05-11 VITALS — BP 87/58 | Temp 97.7°F | Wt 238.2 lb

## 2013-05-11 DIAGNOSIS — Z3493 Encounter for supervision of normal pregnancy, unspecified, third trimester: Secondary | ICD-10-CM

## 2013-05-11 DIAGNOSIS — E669 Obesity, unspecified: Secondary | ICD-10-CM

## 2013-05-11 DIAGNOSIS — O3410 Maternal care for benign tumor of corpus uteri, unspecified trimester: Secondary | ICD-10-CM

## 2013-05-11 DIAGNOSIS — O99210 Obesity complicating pregnancy, unspecified trimester: Secondary | ICD-10-CM

## 2013-05-11 LAB — POCT URINALYSIS DIP (DEVICE)
Leukocytes, UA: NEGATIVE
Nitrite: NEGATIVE
Protein, ur: 30 mg/dL — AB
Urobilinogen, UA: 1 mg/dL (ref 0.0–1.0)
pH: 6 (ref 5.0–8.0)

## 2013-05-11 NOTE — Progress Notes (Signed)
Doing well. Gained one pound. States [facility] will not provide Ensure. I suggested frequent small meals and may use Instant Breakfast. GC/Ch and GBS done.

## 2013-05-11 NOTE — Progress Notes (Signed)
p-94 

## 2013-05-11 NOTE — Patient Instructions (Signed)
Third Trimester of Pregnancy  The third trimester is from week 29 through week 42, months 7 through 9. The third trimester is a time when the fetus is growing rapidly. At the end of the ninth month, the fetus is about 20 inches in length and weighs 6 10 pounds.   BODY CHANGES  Your body goes through many changes during pregnancy. The changes vary from woman to woman.    Your weight will continue to increase. You can expect to gain 25 35 pounds (11 16 kg) by the end of the pregnancy.   You may begin to get stretch marks on your hips, abdomen, and breasts.   You may urinate more often because the fetus is moving lower into your pelvis and pressing on your bladder.   You may develop or continue to have heartburn as a result of your pregnancy.   You may develop constipation because certain hormones are causing the muscles that push waste through your intestines to slow down.   You may develop hemorrhoids or swollen, bulging veins (varicose veins).   You may have pelvic pain because of the weight gain and pregnancy hormones relaxing your joints between the bones in your pelvis. Back aches may result from over exertion of the muscles supporting your posture.   Your breasts will continue to grow and be tender. A yellow discharge may leak from your breasts called colostrum.   Your belly button may stick out.   You may feel short of breath because of your expanding uterus.   You may notice the fetus "dropping," or moving lower in your abdomen.   You may have a bloody mucus discharge. This usually occurs a few days to a week before labor begins.   Your cervix becomes thin and soft (effaced) near your due date.  WHAT TO EXPECT AT YOUR PRENATAL EXAMS   You will have prenatal exams every 2 weeks until week 36. Then, you will have weekly prenatal exams. During a routine prenatal visit:   You will be weighed to make sure you and the fetus are growing normally.   Your blood pressure is taken.   Your abdomen will be  measured to track your baby's growth.   The fetal heartbeat will be listened to.   Any test results from the previous visit will be discussed.   You may have a cervical check near your due date to see if you have effaced.  At around 36 weeks, your caregiver will check your cervix. At the same time, your caregiver will also perform a test on the secretions of the vaginal tissue. This test is to determine if a type of bacteria, Group B streptococcus, is present. Your caregiver will explain this further.  Your caregiver may ask you:   What your birth plan is.   How you are feeling.   If you are feeling the baby move.   If you have had any abnormal symptoms, such as leaking fluid, bleeding, severe headaches, or abdominal cramping.   If you have any questions.  Other tests or screenings that may be performed during your third trimester include:   Blood tests that check for low iron levels (anemia).   Fetal testing to check the health, activity level, and growth of the fetus. Testing is done if you have certain medical conditions or if there are problems during the pregnancy.  FALSE LABOR  You may feel small, irregular contractions that eventually go away. These are called Braxton Hicks contractions, or   false labor. Contractions may last for hours, days, or even weeks before true labor sets in. If contractions come at regular intervals, intensify, or become painful, it is best to be seen by your caregiver.   SIGNS OF LABOR    Menstrual-like cramps.   Contractions that are 5 minutes apart or less.   Contractions that start on the top of the uterus and spread down to the lower abdomen and back.   A sense of increased pelvic pressure or back pain.   A watery or bloody mucus discharge that comes from the vagina.  If you have any of these signs before the 37th week of pregnancy, call your caregiver right away. You need to go to the hospital to get checked immediately.  HOME CARE INSTRUCTIONS    Avoid all  smoking, herbs, alcohol, and unprescribed drugs. These chemicals affect the formation and growth of the baby.   Follow your caregiver's instructions regarding medicine use. There are medicines that are either safe or unsafe to take during pregnancy.   Exercise only as directed by your caregiver. Experiencing uterine cramps is a good sign to stop exercising.   Continue to eat regular, healthy meals.   Wear a good support bra for breast tenderness.   Do not use hot tubs, steam rooms, or saunas.   Wear your seat belt at all times when driving.   Avoid raw meat, uncooked cheese, cat litter boxes, and soil used by cats. These carry germs that can cause birth defects in the baby.   Take your prenatal vitamins.   Try taking a stool softener (if your caregiver approves) if you develop constipation. Eat more high-fiber foods, such as fresh vegetables or fruit and whole grains. Drink plenty of fluids to keep your urine clear or pale yellow.   Take warm sitz baths to soothe any pain or discomfort caused by hemorrhoids. Use hemorrhoid cream if your caregiver approves.   If you develop varicose veins, wear support hose. Elevate your feet for 15 minutes, 3 4 times a day. Limit salt in your diet.   Avoid heavy lifting, wear low heal shoes, and practice good posture.   Rest a lot with your legs elevated if you have leg cramps or low back pain.   Visit your dentist if you have not gone during your pregnancy. Use a soft toothbrush to brush your teeth and be gentle when you floss.   A sexual relationship may be continued unless your caregiver directs you otherwise.   Do not travel far distances unless it is absolutely necessary and only with the approval of your caregiver.   Take prenatal classes to understand, practice, and ask questions about the labor and delivery.   Make a trial run to the hospital.   Pack your hospital bag.   Prepare the baby's nursery.   Continue to go to all your prenatal visits as directed  by your caregiver.  SEEK MEDICAL CARE IF:   You are unsure if you are in labor or if your water has broken.   You have dizziness.   You have mild pelvic cramps, pelvic pressure, or nagging pain in your abdominal area.   You have persistent nausea, vomiting, or diarrhea.   You have a bad smelling vaginal discharge.   You have pain with urination.  SEEK IMMEDIATE MEDICAL CARE IF:    You have a fever.   You are leaking fluid from your vagina.   You have spotting or bleeding from your vagina.     You have severe abdominal cramping or pain.   You have rapid weight loss or gain.   You have shortness of breath with chest pain.   You notice sudden or extreme swelling of your face, hands, ankles, feet, or legs.   You have not felt your baby move in over an hour.   You have severe headaches that do not go away with medicine.   You have vision changes.  Document Released: 04/24/2001 Document Revised: 12/31/2012 Document Reviewed: 07/01/2012  [employer] Patient Information 2014 [employer].

## 2013-05-12 ENCOUNTER — Telehealth: Payer: Self-pay

## 2013-05-12 DIAGNOSIS — O26893 Other specified pregnancy related conditions, third trimester: Secondary | ICD-10-CM

## 2013-05-12 MED ORDER — RANITIDINE HCL 150 MG PO TABS: 150.0000 mg | ORAL_TABLET | Freq: Two times a day (BID) | ORAL | Status: DC

## 2013-05-12 NOTE — Telephone Encounter (Addendum)
Pt. Called requesting zantac be sent to her [facility] pharmacy on [address] in [place]. States she forgot to ask marie williams yesterday for it but states she has been having bad acid reflux and heart burn. Would like a call back stating whether or not it has been sent to her pharmacy.   Called pt. And informed her we were able to prescribe Zantac per order of Dr. Erin Fulling and that it is ready for her at her pharmacy. Pt. Verbalized understanding and gratitude and had no further questions or concerns.

## 2013-05-13 LAB — CULTURE, BETA STREP (GROUP B ONLY)

## 2013-05-19 ENCOUNTER — Inpatient Hospital Stay (HOSPITAL_COMMUNITY)
Admission: AD | Admit: 2013-05-19 | Discharge: 2013-05-19 | Disposition: A | Discharge status: Home / Self Care | Payer: Medicaid Other | Source: Ambulatory Visit | Attending: Obstetrics and Gynecology | Admitting: Obstetrics and Gynecology

## 2013-05-19 ENCOUNTER — Encounter (HOSPITAL_COMMUNITY): Payer: Self-pay | Admitting: *Deleted

## 2013-05-19 ENCOUNTER — Telehealth: Payer: Self-pay | Admitting: *Deleted

## 2013-05-19 DIAGNOSIS — N898 Other specified noninflammatory disorders of vagina: Secondary | ICD-10-CM | POA: Insufficient documentation

## 2013-05-19 DIAGNOSIS — O26893 Other specified pregnancy related conditions, third trimester: Secondary | ICD-10-CM

## 2013-05-19 DIAGNOSIS — O99891 Other specified diseases and conditions complicating pregnancy: Secondary | ICD-10-CM | POA: Insufficient documentation

## 2013-05-19 DIAGNOSIS — O479 False labor, unspecified: Secondary | ICD-10-CM | POA: Insufficient documentation

## 2013-05-19 DIAGNOSIS — O9989 Other specified diseases and conditions complicating pregnancy, childbirth and the puerperium: Secondary | ICD-10-CM

## 2013-05-19 LAB — POCT FERN TEST: POCT Fern Test: NEGATIVE

## 2013-05-19 NOTE — MAU Provider Note (Signed)
  HPI:  [name] is a 38 y.o. female [phone] at [redacted]w[redacted]d who presents with possible ROM. She started "trickling fluid this morning". The fluid is thick like and clear. She reports good fetal movement, denies vaginal bleeding, vaginal itching/burning, urinary symptoms, h/a, dizziness, n/v, or fever/chills.    Objective:  GENERAL: Well-developed, well-nourished female in no acute distress.  HEENT: Normocephalic, atraumatic.   LUNGS: Effort normal HEART: Regular rate  SKIN: Warm, dry and without erythema PSYCH: Normal mood and affect  Filed Vitals:   05/19/13 1643  BP: 125/73  Pulse: 82  Temp: 98.3 F (36.8 C)  Resp: 16   Speculum exam: Vagina - Small amount of creamy, thick, white discharge, no odor. No pooling of fluid in the vagina. Cervix - No contact bleeding Chaperone present for exam. Dilation: 1 Effacement (%): Thick Cervical Position: Middle Presentation: Vertex Exam by:: J. Rasch, NP   Fetal Tracing: Baseline: 130 bpm Variability: Moderate Accelerations: 15x15 Decelerations: None Toco: Irregular contraction pattern    Results for orders placed during the hospital encounter of 05/19/13 (from the past 48 hour(s))  POCT FERN TEST     Status: None   Collection Time    05/19/13  5:47 PM      Result Value Range   POCT Fern Test Negative = intact amniotic membranes      MDM NST Fern- negative   A:  Vaginal discharge in pregnancy Pt here for possible ROM; fern negative   P: Discharge home Kick counts Labor precautions discussed Return to MAU as needed Keep your next scheduled OB appointment.  Darrelyn Hillock Rasch, NP 05/19/2013 5:54 PM

## 2013-05-19 NOTE — MAU Note (Signed)
Patient states she had a gush of clear fluid at 1500. States slight leaking now, not wearing a pad. Some mild contractions. Reports good fetal movement.

## 2013-05-19 NOTE — Telephone Encounter (Signed)
Archie called and reports she thinks her water may have broke. States she has fluid running out, states she feels baby moving, denies any contractions. Instructed Abelina to come to MAU for evaluation asap.

## 2013-05-20 NOTE — MAU Provider Note (Signed)
Attestation of Attending Supervision of Advanced Practitioner (CNM/NP): Evaluation and management procedures were performed by the Advanced Practitioner under my supervision and collaboration.  I have reviewed the Advanced Practitioner's note and chart, and I agree with the management and plan.  Vernel Donlan 05/20/2013 7:31 AM

## 2013-05-21 ENCOUNTER — Encounter: Payer: Medicaid Other | Admitting: Advanced Practice Midwife

## 2013-05-27 ENCOUNTER — Encounter: Payer: Medicaid Other | Admitting: Advanced Practice Midwife

## 2013-06-03 ENCOUNTER — Encounter: Payer: Medicaid Other | Admitting: Advanced Practice Midwife

## 2013-06-17 ENCOUNTER — Encounter: Payer: Self-pay | Admitting: Obstetrics and Gynecology

## 2013-06-17 ENCOUNTER — Telehealth: Payer: Self-pay | Admitting: Obstetrics and Gynecology

## 2013-06-17 NOTE — Telephone Encounter (Signed)
Patient called requesting information on how to increase breastmilk. Spoke to patient and advised to get fenagreek or mother's milk from [facility] or other drug store. Patient agrees and satisfied. FYI: patient states that she delivered a baby boy on 06/02/2013 @ [facility]

## 2013-07-15 ENCOUNTER — Telehealth: Payer: Self-pay | Admitting: *Deleted

## 2013-07-15 NOTE — Telephone Encounter (Signed)
Patient called to see if she can get her depo shot prior to her postpartum visit next week. I asked patient if she has had unprotected sex in the past week and she stated that she has and is not using condoms. I informed patient that she needs to abstain from sex or use condoms until she is seen for her next visit. Pt was upset and said that she didn't understand why we wouldn't do it now. I told her that if she is not using protection she can get pregnant.

## 2013-07-23 ENCOUNTER — Ambulatory Visit (INDEPENDENT_AMBULATORY_CARE_PROVIDER_SITE_OTHER): Payer: Medicaid Other | Admitting: Advanced Practice Midwife

## 2013-07-23 ENCOUNTER — Encounter: Payer: Self-pay | Admitting: Advanced Practice Midwife

## 2013-07-23 VITALS — BP 133/84 | HR 63 | Temp 96.9°F | Ht 64.0 in | Wt 246.4 lb

## 2013-07-23 DIAGNOSIS — Z3049 Encounter for surveillance of other contraceptives: Secondary | ICD-10-CM

## 2013-07-23 DIAGNOSIS — Z01812 Encounter for preprocedural laboratory examination: Secondary | ICD-10-CM

## 2013-07-23 DIAGNOSIS — IMO0001 Reserved for inherently not codable concepts without codable children: Secondary | ICD-10-CM

## 2013-07-23 LAB — POCT PREGNANCY, URINE: PREG TEST UR: NEGATIVE

## 2013-07-23 MED ORDER — MEDROXYPROGESTERONE ACETATE 104 MG/0.65ML ~~LOC~~ SUSP: 104.0000 mg | SUBCUTANEOUS | Status: DC

## 2013-07-23 MED ORDER — MEDROXYPROGESTERONE ACETATE 104 MG/0.65ML ~~LOC~~ SUSP
104.0000 mg | Freq: Once | SUBCUTANEOUS | Status: AC
  Administered 2013-07-23: 104 mg via SUBCUTANEOUS

## 2013-07-23 NOTE — Progress Notes (Signed)
  Subjective:     Melissa [name] is a 38 y.o. female who presents for a postpartum visit. She is 5 weeks postpartum following a spontaneous vaginal delivery. I have fully reviewed the prenatal and intrapartum course. The delivery was at term. Outcome: spontaneous vaginal delivery. Anesthesia: none. Postpartum course has been uneventful. Baby's course has been uneventful. Baby is feeding by Bottle. Bleeding no bleeding. Bowel function is normal. Bladder function is normal. Patient is sexually active. Contraception method is Depo-Provera injections. Postpartum depression screening: negative.  The following portions of the patient's history were reviewed and updated as appropriate: allergies, current medications, past family history, past medical history, past social history, past surgical history and problem list.  Review of Systems Pertinent items are noted in HPI.   Objective:    BP 133/84  Pulse 63  Temp(Src) 96.9 F (36.1 C)  Ht 5\' 4"  (1.626 m)  Wt 246 lb 6.4 oz (111.766 kg)  BMI 42.27 kg/m2  LMP 07/12/2013  Breastfeeding? No  General:  alert and no distress   Breasts:  inspection negative, no nipple discharge or bleeding, no masses or nodularity palpable  Lungs: clear to auscultation bilaterally  Heart:  regular rate and rhythm, S1, S2 normal, no murmur, click, rub or gallop  Abdomen: soft, non-tender; bowel sounds normal; no masses,  no organomegaly   Vulva:  not evaluated  Vagina: not evaluated  Cervix:  n/a  Corpus: not examined  Adnexa:  not evaluated  Rectal Exam: Not performed.        Assessment:     Normal postpartum exam. Pap smear not done at today's visit.   It is due in July  Plan:    1. Contraception: Depo-Provera injections 2.  Pt reports has had IC since delivery. Discussed risk of early pregnancy.  Pt feels she will not be able to avoid IC for 2 wks as per clinic protocol. Discussed with Dr Roselie Awkward. We feel it is best to go ahead and give Depo since UPT neg  and chance of conception will only increase with time. Pt accepts risk.  She agrees to take UPT in 2 wks and again a month later.  3. Follow up in: 4 months or as needed.

## 2013-07-23 NOTE — Patient Instructions (Signed)

## 2013-07-23 NOTE — Progress Notes (Signed)
Pt. Here today for postpartum visit. Pt. Would like depo provera for contraception. Pt. Has had unprotected sex and last time was Saturday. Pt. Will do UPT today and come back in two weeks for another one, if that test is negative then pt. Will get depo.

## 2013-07-31 ENCOUNTER — Telehealth: Payer: Self-pay | Admitting: *Deleted

## 2013-07-31 NOTE — Telephone Encounter (Signed)
Called patient and left message that she will need to contact her primary care physician for referral.  Informed if need any further assistance to contact the office.

## 2013-07-31 NOTE — Telephone Encounter (Signed)
Pt called nurse line requesting a referral for colonoscopy due to mother's diagnosis  with Stage II colon CA.

## 2013-08-06 ENCOUNTER — Ambulatory Visit: Payer: Medicaid Other

## 2013-08-19 ENCOUNTER — Telehealth: Payer: Self-pay

## 2013-08-19 NOTE — Telephone Encounter (Signed)
Pt. Called stating "I need a call ASAP. I have a bad sinus infection and nothing over the counter has been helping. I have tried everything I could try. I am sneezing, coughing, have a runny nose, my nose is bleeding I have sores in my nose. I need a call back now." Called pt. And informed her that she will need to go to her PCP or urgent care. Pt. States she is on the waiting list for PCP and therefore she will go to urgent care. No other questions or concerns.

## 2013-09-30 ENCOUNTER — Telehealth: Payer: Self-pay | Admitting: *Deleted

## 2013-09-30 NOTE — Telephone Encounter (Signed)
Patient said that she thinks she has a cyst. When she sits a certain way she feels it. She requests an appointment. I advised that she go to [facility] if she is very concerned about it or is in a lot of pain. Patient states that she can wait to be seen in July.

## 2013-10-13 ENCOUNTER — Telehealth: Payer: Self-pay

## 2013-10-13 MED ORDER — NORGESTIM-ETH ESTRAD TRIPHASIC 0.18/0.215/0.25 MG-25 MCG PO TABS: 1.0000 | ORAL_TABLET | Freq: Every day | ORAL | Status: DC

## 2013-10-13 NOTE — Telephone Encounter (Signed)
Pt called and requested that she now take a low estrogen BCP instead of her Depo Provera injection.  Pt next depo provera due 10/17/13, so pt is not late.  I asked pt will she remember to take medication once a day at the same time.  Pt stated yes.  Per Fatima Blank pt can have low estrogen BCP ordered w/o coming in since she is not late on her Depo injection. I informed pt and verified her pharmacy.  Pt had no further questions.

## 2013-10-14 ENCOUNTER — Ambulatory Visit: Payer: Medicaid Other

## 2013-10-19 ENCOUNTER — Ambulatory Visit (INDEPENDENT_AMBULATORY_CARE_PROVIDER_SITE_OTHER): Payer: Medicaid Other | Admitting: Family Medicine

## 2013-10-19 ENCOUNTER — Encounter: Payer: Self-pay | Admitting: Family Medicine

## 2013-10-19 VITALS — BP 108/74 | HR 90 | Temp 98.1°F | Ht 64.0 in | Wt 252.5 lb

## 2013-10-19 DIAGNOSIS — J00 Acute nasopharyngitis [common cold]: Secondary | ICD-10-CM

## 2013-10-19 DIAGNOSIS — Z7689 Persons encountering health services in other specified circumstances: Secondary | ICD-10-CM

## 2013-10-19 DIAGNOSIS — Z7189 Other specified counseling: Secondary | ICD-10-CM

## 2013-10-19 DIAGNOSIS — IMO0001 Reserved for inherently not codable concepts without codable children: Secondary | ICD-10-CM

## 2013-10-19 DIAGNOSIS — Z309 Encounter for contraceptive management, unspecified: Secondary | ICD-10-CM

## 2013-10-19 NOTE — Progress Notes (Signed)
Patient ID: Melissa [name], female   DOB: 26-Apr-1976, 38 y.o.   MRN: [id number] Subjective:   CC: Patient with no previous PCP presents today to establish care.She has no concerns other than getting over a cold and wanting to discuss birth control.  HPI:   Cold Patient is a 38 y.o. female with h/o HTN with a few days of cold symptoms with no fever. She does not report dyspnea or difficulty maintaining hydration. She thinks she caught a cold from her children and husband.  Desire to change contraceptive method Patient plans to stop taking her OCP today, which she has been on ~6 months, because it is giving her daily headaches. She would like to get on depo provera shot but does not have time for pregnancy test or shot today.    Review of Systems - Per HPI. Otherwise, no complaints  PMH: Allergies HTN diagnosed 2015  PSH: 2013: Right tube removed  Meds: OCP since 5-6 mo - plans to stop today. One-A-Day vitamin daily Zyrtec D 10mg  BID  OB:  Miscarriage July 2014 of one of twins Ectopic Jan 2013  FH: Sister with asthma, diabetes, HLD, and HTN Brother with crohns, "brain cancer", DM, early death, HLD, and HTN Grandmother with colon cancer in her 63s, Heart Disease, and stroke Mother with colon cancer in her 64s, DM, HLD, HTN, and thyroid disease Father with cancer, HLD, and HTN Paternal aunt with breast cancer, maternal aunt with cancer, DM, HLD, and HTN  SH: Unemployed Sexually active with her husband. Lives with husband, 29 year old daughter, 43 year old son, and 63 month old son. Occasional exercise. Never smoker. Never drug use or alcohol. Does not feel at risk for STD and does feel safe in relationship.     Objective:  Physical Exam BP 108/74  Pulse 90  Temp(Src) 98.1 F (36.7 C) (Oral)  Ht 5\' 4"  (1.626 m)  Wt 252 lb 8 oz (114.533 kg)  BMI 43.32 kg/m2 GEN: NAD, pleasant CV: RRR EXTR: No LE edema PULM: CTAB, normal effort HEENT: PERRL, o/p clear      Assessment:     Melissa [name] is a 38 y.o. female with h/o HTN here to establish care.    Plan:     Establish care visit Medical history reviewed and gave patient basic information about our clinic.  Cold symptoms Likely viral URI, mild symptoms with no fever, dyspnea, or dehydration. - Return precautions reviewed. - Rest, fluids, and handwashing.  Desire to change contraceptive method Patient would like to stop OCP due to headaches and prior rash and start depo provera shot which she has used before. She wants to come back for this due to taking her child to another appointment.  - Discussed importance of contraception (condoms if patient plans to stop OCP) until she can get in for depo provera shot if she does not want another child. She voices understanding but is also open to another child. - Return to discuss.  # Health Maintenance:  - last pap smear 2014 and normal; prior was abnormal before 2 sons - Due for visit. Call to make appt.  Follow-up: Follow up when convenient for pt within 1-2 months for health maintenance visit.   Hilton Sinclair, MD [place]

## 2013-10-20 ENCOUNTER — Ambulatory Visit (INDEPENDENT_AMBULATORY_CARE_PROVIDER_SITE_OTHER): Payer: Medicaid Other

## 2013-10-20 VITALS — BP 142/84 | HR 94 | Wt 253.8 lb

## 2013-10-20 DIAGNOSIS — Z3049 Encounter for surveillance of other contraceptives: Secondary | ICD-10-CM

## 2013-10-20 MED ORDER — MEDROXYPROGESTERONE ACETATE 104 MG/0.65ML ~~LOC~~ SUSP
104.0000 mg | Freq: Once | SUBCUTANEOUS | Status: AC
  Administered 2013-10-20: 104 mg via SUBCUTANEOUS

## 2013-10-20 NOTE — Progress Notes (Signed)
Pt called the front desk and phone was transferred.  Pt stated that she has started taking BCP a week ago due to wanting to change from the Depo Provera.  Pt stated that the BCP is currently having her breakout and wants to return to the Depo Provera.  Pt is still currently within parameters to receive the Depo Provera from her last injection.  Per Dr. Kennon Rounds, pt can come in for a Depo Provera injection.  Pt stated that she would be here today 10/20/13 to receive injection.

## 2013-10-21 NOTE — Progress Notes (Signed)
Agree with RN documentation for this encounter

## 2013-10-22 ENCOUNTER — Encounter: Payer: Self-pay | Admitting: Family Medicine

## 2013-10-22 DIAGNOSIS — Z309 Encounter for contraceptive management, unspecified: Secondary | ICD-10-CM | POA: Insufficient documentation

## 2013-10-22 DIAGNOSIS — IMO0001 Reserved for inherently not codable concepts without codable children: Secondary | ICD-10-CM | POA: Insufficient documentation

## 2013-10-22 NOTE — Assessment & Plan Note (Signed)
Patient would like to stop OCP due to headaches and prior rash and start depo provera shot which she has used before. She wants to come back for this due to taking her child to another appointment.  - Discussed importance of contraception (condoms if patient plans to stop OCP) until she can get in for depo provera shot if she does not want another child. She voices understanding but is also open to another child. - Return to discuss.

## 2013-10-22 NOTE — Assessment & Plan Note (Signed)
Likely viral URI, mild symptoms with no fever, dyspnea, or dehydration. - Return precautions reviewed. - Rest, fluids, and handwashing.

## 2013-11-26 ENCOUNTER — Encounter: Payer: Self-pay | Admitting: Obstetrics & Gynecology

## 2013-11-26 ENCOUNTER — Ambulatory Visit (INDEPENDENT_AMBULATORY_CARE_PROVIDER_SITE_OTHER): Payer: Medicaid Other | Admitting: Obstetrics & Gynecology

## 2013-11-26 VITALS — BP 136/88 | HR 88 | Temp 98.5°F | Ht 66.0 in | Wt 255.2 lb

## 2013-11-26 DIAGNOSIS — N949 Unspecified condition associated with female genital organs and menstrual cycle: Secondary | ICD-10-CM

## 2013-11-26 DIAGNOSIS — R102 Pelvic and perineal pain: Secondary | ICD-10-CM

## 2013-11-26 NOTE — Progress Notes (Signed)
Subjective:     Patient ID: Melissa [name], female   DOB: 10-Jul-1975, 38 y.o.   MRN: [id number]  HPI Pt c/o painful cyst in vagina.  She had it previously but, it resolved.  She has not felt anything since last months.   Review of Systems     Objective:   Physical Exam BP 136/88  Pulse 88  Temp(Src) 98.5 F (36.9 C) (Oral)  Ht 5\' 6"  (1.676 m)  Wt 255 lb 3.2 oz (115.758 kg)  BMI 41.21 kg/m2  Breastfeeding? No  Pt in NAD GU: EGBUS: no lesions Vagina: no blood in vault; no cyst noted. Prominent ruggae on left side but, otherwise normal Cervix: no lesion; no mucopurulent d/c      Assessment:     Vaginal pain     Plan:     F/u prn

## 2013-12-22 ENCOUNTER — Telehealth: Payer: Self-pay | Admitting: *Deleted

## 2013-12-22 NOTE — Telephone Encounter (Signed)
Patient called and request medication for shingles on her earlobe that is causing her severe headaches.  Encouraged patient to go to Urgent Care, Primary Doctor or ER.  Pt states she cannot leave her children.  Reinforced appropriate plan for this medical issue.  Pt verbalizes understanding.

## 2014-01-20 ENCOUNTER — Ambulatory Visit: Payer: Medicaid Other

## 2014-01-21 ENCOUNTER — Ambulatory Visit (INDEPENDENT_AMBULATORY_CARE_PROVIDER_SITE_OTHER): Payer: Medicaid Other | Admitting: *Deleted

## 2014-01-21 VITALS — BP 128/71 | HR 77 | Temp 98.4°F | Wt 264.2 lb

## 2014-01-21 DIAGNOSIS — Z3049 Encounter for surveillance of other contraceptives: Secondary | ICD-10-CM

## 2014-01-21 DIAGNOSIS — Z30013 Encounter for initial prescription of injectable contraceptive: Secondary | ICD-10-CM

## 2014-01-21 LAB — POCT PREGNANCY, URINE: PREG TEST UR: NEGATIVE

## 2014-01-21 MED ORDER — MEDROXYPROGESTERONE ACETATE 104 MG/0.65ML ~~LOC~~ SUSP
104.0000 mg | Freq: Once | SUBCUTANEOUS | Status: AC
  Administered 2014-01-21: 104 mg via SUBCUTANEOUS

## 2014-01-21 NOTE — Progress Notes (Signed)
Depo Provera 104 mg SQ

## 2014-01-26 ENCOUNTER — Telehealth: Payer: Self-pay | Admitting: *Deleted

## 2014-01-26 NOTE — Telephone Encounter (Signed)
Pt called with concerns about her recent Depo Provera injection on 9/10. She stated that after receiving the injection in her arm, the site became swollen and she had itching as well.  The swelling has disappeared but the area is still itching.  She also stated that her husband looked at the sits and told her there is a small black/purple spot where the injection was given. I advised pt that she has experienced some site irritation and possibly a bruise which can be normal. She denied pain, redness or current swelling of the area. I offered pt to come in for a nurse to look at her arm. She declined and stated that she will just watch it. I advised that if she has additional problems or questions to call back and we will be able to see her on the same Daniele Dillow. I also advised that pt may want to have her next Depo injection in her abdomen which is the preferred site.  Pt voiced understanding of all information given.

## 2014-03-08 ENCOUNTER — Ambulatory Visit: Payer: Medicaid Other | Admitting: Family Medicine

## 2014-03-09 ENCOUNTER — Telehealth: Payer: Self-pay | Admitting: Home Health Services

## 2014-03-09 NOTE — Telephone Encounter (Signed)
LVM to schedule flu clinic appointment.

## 2014-03-15 ENCOUNTER — Encounter: Payer: Self-pay | Admitting: Obstetrics & Gynecology

## 2014-03-25 ENCOUNTER — Ambulatory Visit (INDEPENDENT_AMBULATORY_CARE_PROVIDER_SITE_OTHER): Payer: Self-pay | Admitting: Obstetrics and Gynecology

## 2014-03-25 ENCOUNTER — Encounter: Payer: Self-pay | Admitting: Obstetrics and Gynecology

## 2014-03-25 VITALS — BP 113/77 | HR 77 | Temp 99.0°F | Ht 65.0 in | Wt 245.3 lb

## 2014-03-25 DIAGNOSIS — Z113 Encounter for screening for infections with a predominantly sexual mode of transmission: Secondary | ICD-10-CM

## 2014-03-25 NOTE — Progress Notes (Signed)
Patient ID: Melissa [name], female   DOB: [birth year], 38 y.o.   MRN: [id number] 38 yo [id number] presenting today requesting STD screen. Patient is very emotional in the examining room as she found out a week ago that her husband left her for another woman. She reports the presence of a pruritic vaginal discharge with an odor. Patient states that she has a strong support system but this news is affecting her spirits  Past Medical History  Diagnosis Date  . Abnormal Pap smear 08/30/2008    LGSIL  . Infertility     Secondary PCOS  . PCOS (polycystic ovarian syndrome)   . Chlamydia   . Fibroid   . Shingles outbreak 11/2011  . Allergy    Past Surgical History  Procedure Laterality Date  . Wisdom tooth extraction    . Right ear surgery    . Laparoscopy  07/05/2011    Procedure: LAPAROSCOPY OPERATIVE;  Surgeon: Osborne Oman, MD;  Location: [facility];  Service: Gynecology;  Laterality: N/A;  . Ear tube removal    . Dilation and curettage of uterus  06/2011    ectopic   Family History  Problem Relation Age of Onset  . Anesthesia problems Neg Hx   . Diabetes Mother   . Hypertension Mother   . Cancer Father   . Fibroids Sister   . Hypertension Sister   . Crohn's disease Brother   . Cancer Brother   . Diabetes Brother   . Hypertension Brother   . Kidney disease Brother    History  Substance Use Topics  . Smoking status: Never Smoker   . Smokeless tobacco: Never Used  . Alcohol Use: No   GENERAL: Well-developed, well-nourished female in no acute distress. Obese ABDOMEN: Soft, nontender, nondistended. No organomegaly. PELVIC: Normal external female genitalia. Vagina is pink and rugated.  Normal discharge. Normal appearing cervix. Uterus is normal in size. No adnexal mass or tenderness. EXTREMITIES: No cyanosis, clubbing, or edema, 2+ distal pulses.  A/P 38 yo here for STD screen - wet prep and vaginal cultures obtained - HIV, hepatitis B &C, and RPR ordered - emotional support  offered - Also discussed weight management with diet and exercise (at least 150 minutes of moderate cardio activity per week)

## 2014-03-26 LAB — RPR

## 2014-03-26 LAB — HEPATITIS C ANTIBODY: HCV Ab: NEGATIVE

## 2014-03-26 LAB — GC/CHLAMYDIA PROBE AMP
CT Probe RNA: NEGATIVE
GC Probe RNA: NEGATIVE

## 2014-03-26 LAB — HEPATITIS B SURFACE ANTIGEN: Hepatitis B Surface Ag: NEGATIVE

## 2014-03-26 LAB — WET PREP, GENITAL
Clue Cells Wet Prep HPF POC: NONE SEEN
TRICH WET PREP: NONE SEEN
YEAST WET PREP: NONE SEEN

## 2014-03-26 LAB — HIV ANTIBODY (ROUTINE TESTING W REFLEX): HIV 1&2 Ab, 4th Generation: NONREACTIVE

## 2014-03-30 ENCOUNTER — Telehealth: Payer: Self-pay | Admitting: *Deleted

## 2014-03-30 NOTE — Telephone Encounter (Signed)
Pt called into office to speak to a nurse. Results given, pt continues to experience brownish discharge and is concern over what this may be.  Explained to patient that it may be old blood or that she is getting ready to start her period. Pt continues to be concerned, stating this has never happened to be before. Offered to speak with Dr. Elly Modena and to call back with her recommendation.  Pt verbalizes understanding.

## 2014-03-31 NOTE — Telephone Encounter (Signed)
Contacted patient and informed of Dr. Domenic Schwab recommendations.  Discharge is normal and can vary in color due to hormonal changes.  We would be concerned if there was a foul odor or itching.  Pt verbalizes understanding.

## 2014-04-15 ENCOUNTER — Ambulatory Visit: Payer: Medicaid Other

## 2014-04-19 ENCOUNTER — Telehealth: Payer: Self-pay | Admitting: General Practice

## 2014-04-19 DIAGNOSIS — N83209 Unspecified ovarian cyst, unspecified side: Secondary | ICD-10-CM

## 2014-04-19 NOTE — Telephone Encounter (Signed)
Patient called and left message stating she thinks her cyst/fibroid has got bigger and needs an ultrasound. She was here recently two weeks ago and had a pelvic done.

## 2014-04-20 NOTE — Telephone Encounter (Signed)
Called patient back and she states that she has been taking ibuprofen and uses a heating pad daily and that this has been going on for months now. States she can feel a lump in that area and is concerned about cancer because it runs in her family. Also states they saw a 3cm cyst during her pregnancy as well last year and wants this checked out because she's not taking any chances with it being cancer. Told patient I will call her back. Called patient, no answer- left message I am trying to reach you

## 2014-04-20 NOTE — Telephone Encounter (Signed)
Mora Bellman, MD  [facility]           Please inform patient to treat her pain with ibuprofen and heating pad. If the pain does not improve after a week an ultrasound will be ordered   [employer]

## 2014-04-20 NOTE — Telephone Encounter (Signed)
Called patient back and she states that she is having pain in her left groin and pain is worsening since her visit with [place] last month and would like a ultrasound to check for cyst. Told patient I will have to speak with Dr Elly Modena and whenever I hear back from her we will call her. Patient verbalized understanding and had no questions

## 2014-04-27 NOTE — Telephone Encounter (Signed)
Called pt and I asked pt if she is still having pain.  Pt informed me that she is having pain off and on.  She states that she has used ibuprofen and a heating pad and it is not effectively " I really want to see some images other than a pelvic exam".  As recommendated by Dr. Elly Modena, [place] ordered and scheduled for 05/11/14 @ 1015am.   Called pt and informed pt of [place] appt .  Pt agreed.  Pt stated that she wanted a follow up appt made as well.  I informed pt that I would send a note to the front office and they will call her with an appt.  Pt agreed.

## 2014-05-11 ENCOUNTER — Ambulatory Visit (HOSPITAL_COMMUNITY): Payer: Medicaid Other

## 2014-05-11 ENCOUNTER — Telehealth: Payer: Self-pay | Admitting: *Deleted

## 2014-05-11 NOTE — Telephone Encounter (Signed)
Pt left message stating she is sick and needs to reschedule her [place] appt on 12/29. I returned her call and left message stating that I will notify the [place] dept of her cancellation. I stated that she can call them directly to reschedule and gave the number for her to call.  She should call us back if she has any difficulty with rescheduling the [place] appt.

## 2014-08-18 ENCOUNTER — Ambulatory Visit: Payer: Medicaid Other | Admitting: Obstetrics & Gynecology

## 2014-09-02 ENCOUNTER — Ambulatory Visit: Payer: Medicaid Other | Admitting: Obstetrics & Gynecology

## 2014-09-08 ENCOUNTER — Ambulatory Visit: Payer: Medicaid Other | Admitting: Obstetrics & Gynecology

## 2014-09-15 ENCOUNTER — Ambulatory Visit: Payer: Medicaid Other | Admitting: Obstetrics & Gynecology

## 2014-09-22 ENCOUNTER — Ambulatory Visit: Payer: Medicaid Other | Admitting: Obstetrics & Gynecology

## 2014-11-12 ENCOUNTER — Ambulatory Visit (INDEPENDENT_AMBULATORY_CARE_PROVIDER_SITE_OTHER): Payer: Medicaid Other | Admitting: Obstetrics & Gynecology

## 2014-11-12 ENCOUNTER — Encounter: Payer: Self-pay | Admitting: Obstetrics & Gynecology

## 2014-11-12 VITALS — BP 135/81 | HR 76 | Temp 98.0°F | Wt 248.0 lb

## 2014-11-12 DIAGNOSIS — N898 Other specified noninflammatory disorders of vagina: Secondary | ICD-10-CM | POA: Diagnosis present

## 2014-11-12 NOTE — Patient Instructions (Signed)
Bacterial Vaginosis Bacterial vaginosis is a vaginal infection that occurs when the normal balance of bacteria in the vagina is disrupted. It results from an overgrowth of certain bacteria. This is the most common vaginal infection in women of childbearing age. Treatment is important to prevent complications, especially in pregnant women, as it can cause a premature delivery. CAUSES  Bacterial vaginosis is caused by an increase in harmful bacteria that are normally present in smaller amounts in the vagina. Several different kinds of bacteria can cause bacterial vaginosis. However, the reason that the condition develops is not fully understood. RISK FACTORS Certain activities or behaviors can put you at an increased risk of developing bacterial vaginosis, including:  Having a new sex partner or multiple sex partners.  Douching.  Using an intrauterine device (IUD) for contraception. Women do not get bacterial vaginosis from toilet seats, bedding, swimming pools, or contact with objects around them. SIGNS AND SYMPTOMS  Some women with bacterial vaginosis have no signs or symptoms. Common symptoms include:  Grey vaginal discharge.  A fishlike odor with discharge, especially after sexual intercourse.  Itching or burning of the vagina and vulva.  Burning or pain with urination. DIAGNOSIS  Your health care provider will take a medical history and examine the vagina for signs of bacterial vaginosis. A sample of vaginal fluid may be taken. Your health care provider will look at this sample under a microscope to check for bacteria and abnormal cells. A vaginal pH test may also be done.  TREATMENT  Bacterial vaginosis may be treated with antibiotic medicines. These may be given in the form of a pill or a vaginal cream. A second round of antibiotics may be prescribed if the condition comes back after treatment.  HOME CARE INSTRUCTIONS   Only take over-the-counter or prescription medicines as  directed by your health care provider.  If antibiotic medicine was prescribed, take it as directed. Make sure you finish it even if you start to feel better.  Do not have sex until treatment is completed.  Tell all sexual partners that you have a vaginal infection. They should see their health care provider and be treated if they have problems, such as a mild rash or itching.  Practice safe sex by using condoms and only having one sex partner. SEEK MEDICAL CARE IF:   Your symptoms are not improving after 3 days of treatment.  You have increased discharge or pain.  You have a fever. MAKE SURE YOU:   Understand these instructions.  Will watch your condition.  Will get help right away if you are not doing well or get worse. FOR MORE INFORMATION  Centers for Disease Control and Prevention, Division of STD Prevention: www.cdc.gov/std American Sexual Health Association (ASHA): www.ashastd.org  Document Released: 04/30/2005 Document Revised: 02/18/2013 Document Reviewed: 12/10/2012 ExitCare Patient Information 2015 [employer]. This information is not intended to replace advice given to you by your health care provider. Make sure you discuss any questions you have with your health care provider.  

## 2014-11-12 NOTE — Progress Notes (Signed)
Patient ID: Melissa [name], female   DOB: 1975/08/30, 39 y.o.   MRN: [id number] Subjective:     Melissa [name] is a 39 y.o. female here for a routine exam.  Current complaints: clear vaginal discharge with fishy odor. Patient says that there is no pain but the discharge and odor is bothersome to her. She states that it started when her depo shot wore off and it has been present for several months.   Gynecologic History Last Pap: 12/03/2012. Results were: normal   Obstetric History OB History  Gravida Para Term Preterm AB SAB TAB Ectopic Multiple Living  5 2 2  0 2 1 0 1 0 2    # Outcome Date GA Lbr Len/2nd Weight Sex Delivery Anes PTL Lv  5 Term 05/30/12 [redacted]w[redacted]d 17:21 / 00:11 6 lb 10 oz (3.005 kg) [name] EPI  Y  4 Ectopic 07/05/11             Comments: D&C  3 Term 04/26/00     Vag-Spont   Y  2 Gravida              Comments: System Generated. Please review and update pregnancy details.  1 SAB                The following portions of the patient's history were reviewed and updated as appropriate: allergies, current medications, past family history, past medical history, past social history, past surgical history and problem list.  Review of Systems  Endorses: mild headaches, ocassional ear pain Objective:  BP 135/81 mmHg  Pulse 76  Temp(Src) 98 F (36.7 C) (Oral)  Wt 248 lb (112.492 kg) Gynecology  Pelvic: Cervix normal in appearance, external genitalia normal, normal physiological discharge noted near cervix on speculum exam. No cervical erythema or blood in vaginal vault.  Assessment:    Healthy female exam.    Plan:    Wet prep and GC/chlamydia was performed, results pending. Patient was educated on probable presence of normal physiological discharge since stopping Depo and resumption of normal menstruation. We will follow up with patients regarding results of labs. Call clinic with any further questions.

## 2014-11-12 NOTE — Progress Notes (Deleted)
Patient ID: Naiara Lombardozzi, female   DOB: [birth year], 39 y.o.   MRN: [id number] History:  39 y.o. [id number] here today for   The following portions of the patient's history were reviewed and updated as appropriate: allergies, current medications, past family history, past medical history, past social history, past surgical history and problem list.  Review of Systems:  {Ros - complete:30496}  Objective:  Physical Exam Blood pressure 135/81, pulse 76, temperature 98 F (36.7 C), temperature source Oral, weight 248 lb (112.492 kg). Gen: NAD Abd: Soft, nontender and nondistended Pelvic: Normal appearing external genitalia; normal appearing vaginal mucosa and cervix.  Normal discharge.  Small uterus, no other palpable masses, no uterine or adnexal tenderness  Labs and Imaging No results found.  Assessment & Plan:  ***

## 2014-11-17 LAB — GC/CHLAMYDIA PROBE AMP
CT PROBE, AMP APTIMA: NEGATIVE
GC PROBE AMP APTIMA: NEGATIVE

## 2014-12-13 ENCOUNTER — Telehealth: Payer: Self-pay | Admitting: *Deleted

## 2014-12-13 DIAGNOSIS — B379 Candidiasis, unspecified: Secondary | ICD-10-CM

## 2014-12-13 DIAGNOSIS — N760 Acute vaginitis: Secondary | ICD-10-CM

## 2014-12-13 DIAGNOSIS — B9689 Other specified bacterial agents as the cause of diseases classified elsewhere: Secondary | ICD-10-CM

## 2014-12-13 NOTE — Telephone Encounter (Signed)
Received voicemail message from patient left on nurse line on 12/13/14 at 1421.  Patient states she is still having itching and discharge since her last visit on 11/12/14.  States she has not been sexually active since October.  Is concerned about the cause of the discharge and requests a return call.

## 2014-12-14 MED ORDER — FLUCONAZOLE 150 MG PO TABS: 150.0000 mg | ORAL_TABLET | Freq: Once | ORAL | Status: DC

## 2014-12-14 MED ORDER — METRONIDAZOLE 500 MG PO TABS: 500.0000 mg | ORAL_TABLET | Freq: Two times a day (BID) | ORAL | Status: DC

## 2014-12-14 NOTE — Telephone Encounter (Signed)
Called patient and asked if she was having itch and odor with her discharge or just itching. Patient states she is having an itch and odor and it is very irritating. Told patient we will send her two prescriptions to her pharmacy, one for BV and one for a yeast infection because she might actually have a combination of the both. Patient verbalized understanding. Told patient to take flagyl twice a day for a week then on the last day she can take the diflucan for a yeast infection. Patient verbalized understanding and had no other questions

## 2015-01-05 ENCOUNTER — Encounter: Payer: Self-pay | Admitting: Obstetrics & Gynecology

## 2015-01-05 ENCOUNTER — Ambulatory Visit (INDEPENDENT_AMBULATORY_CARE_PROVIDER_SITE_OTHER): Payer: Medicaid Other | Admitting: Obstetrics & Gynecology

## 2015-01-05 VITALS — BP 122/78 | HR 91 | Temp 99.0°F | Ht 63.0 in | Wt 243.4 lb

## 2015-01-05 DIAGNOSIS — R102 Pelvic and perineal pain: Secondary | ICD-10-CM

## 2015-01-05 MED ORDER — NAPROXEN 500 MG PO TABS: 500.0000 mg | ORAL_TABLET | Freq: Two times a day (BID) | ORAL | Status: DC

## 2015-01-05 NOTE — Progress Notes (Signed)
Here for c/o lump in vaginal area with pain.

## 2015-01-05 NOTE — Progress Notes (Signed)
Patient ID: Melissa [name], female   DOB: August 25, 1975, 39 y.o.   MRN: [id number] History:  39 y.o. [id number] here today for eval of vaginal pain on left side.  She reports pain with sitting and lying down and walking.  She does not report pain with intercourse but, reports pain with washing.  She reports that the pain in internal.  She says that a 3cm mass was seen during her pregnancy and she is worried that this is a fibroid in the vagina.     The following portions of the patient's history were reviewed and updated as appropriate: allergies, current medications, past family history, past medical history, past social history, past surgical history and problem list.  Review of Systems:  Pertinent items are noted in HPI.  Objective:  Physical Exam Blood pressure 122/78, pulse 91, temperature 99 F (37.2 C), height 5\' 3"  (1.6 m), weight 243 lb 6.4 oz (110.406 kg), last menstrual period 01/05/2015. Gen: NAD Abd: Soft, nontender and nondistended Pelvic: Normal appearing external genitalia; normal appearing vaginal mucosa and cervix.  Normal discharge. Small amount of blood noted (pt started menses today)  No mass palpated.  There is an area of tenderness in the vagina on the left side it appears to be over the hip joint.   Assessment & Plan:  Vaginal pain.  Question pain from hip Naprosyn 500mg  bid for 2 weeks then prn   Melissa [name], M.D., Melissa [name]

## 2015-02-03 ENCOUNTER — Emergency Department (HOSPITAL_COMMUNITY)
Admission: EM | Admit: 2015-02-03 | Discharge: 2015-02-03 | Disposition: A | Discharge status: Home / Self Care | Payer: Medicaid Other | Attending: Emergency Medicine | Admitting: Emergency Medicine

## 2015-02-03 ENCOUNTER — Encounter (HOSPITAL_COMMUNITY): Payer: Self-pay | Admitting: *Deleted

## 2015-02-03 DIAGNOSIS — Z8619 Personal history of other infectious and parasitic diseases: Secondary | ICD-10-CM | POA: Diagnosis not present

## 2015-02-03 DIAGNOSIS — H9201 Otalgia, right ear: Secondary | ICD-10-CM | POA: Insufficient documentation

## 2015-02-03 DIAGNOSIS — R21 Rash and other nonspecific skin eruption: Secondary | ICD-10-CM | POA: Insufficient documentation

## 2015-02-03 DIAGNOSIS — Z86018 Personal history of other benign neoplasm: Secondary | ICD-10-CM | POA: Insufficient documentation

## 2015-02-03 DIAGNOSIS — E785 Hyperlipidemia, unspecified: Secondary | ICD-10-CM | POA: Diagnosis not present

## 2015-02-03 MED ORDER — ACYCLOVIR 200 MG PO CAPS
800.0000 mg | ORAL_CAPSULE | Freq: Once | ORAL | Status: DC
  Filled 2015-02-03: qty 4

## 2015-02-03 MED ORDER — IBUPROFEN 400 MG PO TABS
800.0000 mg | ORAL_TABLET | Freq: Once | ORAL | Status: AC
  Administered 2015-02-03: 800 mg via ORAL
  Filled 2015-02-03: qty 2

## 2015-02-03 MED ORDER — ACYCLOVIR 800 MG PO TABS: 800.0000 mg | ORAL_TABLET | Freq: Four times a day (QID) | ORAL | Status: DC

## 2015-02-03 MED ORDER — ACYCLOVIR 800 MG PO TABS
800.0000 mg | ORAL_TABLET | Freq: Once | ORAL | Status: AC
Start: 2015-02-03 — End: 2015-02-03
  Administered 2015-02-03: 800 mg via ORAL
  Filled 2015-02-03: qty 1

## 2015-02-03 NOTE — ED Provider Notes (Signed)
CSN: [id number]     Arrival date & time 02/03/15  1801 History  This chart was scribed for non-physician practitioner Ottie Glazier, PA-C working with Wandra Arthurs, MD by Meriel Pica, ED Scribe. This patient was seen in room TR05C/TR05C and the patient's care was started at 6:23 PM.  Chief Complaint  Patient presents with  . Herpes Zoster   The history is provided by the patient. No language interpreter was used.   HPI Comments: Melissa [name] is a 39 y.o. female, with a PMhx of shingles, who presents to the Emergency Department complaining of a progressively worsening, constant, pruritic, painfully burning vesicular rash to right side of face onset 1 day ago. She reports associated right eye twitching/irritation and discomfort to right ear. Pt also reports a severe headache onset 4 days ago that was not relieved by any OTC medication and a low grade fever of 101.55F 1 day ago. She has a PMhx of 32 episodes of the singles and states she has been told by an MD at [facility] that the rash was in her right ear on the last breakout. She reports her current symptoms are characteristic of her past shingles breakouts and attributes her breakouts to stress. She has been taking acyclovir that was prescribed from her last breakout and has been providing relief of her symptoms but she states she is almost out of this medication. Denies current fever, hearing loss or vision changes.   Past Medical History  Diagnosis Date  . Abnormal Pap smear 08/30/2008    LGSIL  . Infertility     Secondary PCOS  . PCOS (polycystic ovarian syndrome)   . Chlamydia   . Fibroid   . Shingles outbreak 11/2011  . Allergy    Past Surgical History  Procedure Laterality Date  . Wisdom tooth extraction    . Right ear surgery    . Laparoscopy  07/05/2011    Procedure: LAPAROSCOPY OPERATIVE;  Surgeon: Osborne Oman, MD;  Location: [facility];  Service: Gynecology;  Laterality: N/A;  . Ear tube removal    . Dilation  and curettage of uterus  06/2011    ectopic   Family History  Problem Relation Age of Onset  . Anesthesia problems Neg Hx   . Diabetes Mother   . Hypertension Mother   . Cancer Father   . Fibroids Sister   . Hypertension Sister   . Crohn's disease Brother   . Cancer Brother   . Diabetes Brother   . Hypertension Brother   . Kidney disease Brother    Social History  Substance Use Topics  . Smoking status: Never Smoker   . Smokeless tobacco: Never Used  . Alcohol Use: No   OB History    Gravida Para Term Preterm AB TAB SAB Ectopic Multiple Living   5 2 2  0 2 0 1 1 0 2     Review of Systems  Constitutional: Negative for fever.  HENT: Positive for ear pain ( right ear irritation).   Eyes: Negative for visual disturbance.  Skin: Positive for rash ( right side of face).   Allergies  Spinach  Home Medications   Prior to Admission medications   Medication Sig Start Date End Date Taking? Authorizing Yandell Mcjunkins  acyclovir (ZOVIRAX) 800 MG tablet Take 1 tablet (800 mg total) by mouth 4 (four) times daily. 02/03/15   Hanna Patel-Mills, PA-C  metroNIDAZOLE (FLAGYL) 500 MG tablet Take 1 tablet (500 mg total) by mouth 2 (two) times daily.  Patient not taking: Reported on 01/05/2015 12/14/14   Lavonia Drafts, MD  naproxen (NAPROSYN) 500 MG tablet Take 1 tablet (500 mg total) by mouth 2 (two) times daily with a meal. For 2 weeks then bid prn 01/05/15   Lavonia Drafts, MD   BP 130/75 mmHg  Pulse 81  Temp(Src) 98.6 F (37 C) (Oral)  Resp 16  SpO2 96%  LMP 01/05/2015 Physical Exam  Constitutional: She is oriented to person, place, and time. She appears well-developed and well-nourished. No distress.  Pt in NAD  HENT:  Head: Normocephalic and atraumatic.  Right Ear: External ear and ear canal normal.  Left Ear: Tympanic membrane, external ear and ear canal normal.  3 tiny erythematous vesicles below the right eye. No vision changes. EOM in tact.  PERRLA. No tenderness of  the vesicles.    No vesicles in the right ear canal, posterior ear, or on the pinna or tragus. TM is scarred but does not appear bulging or infected.   Left external ear, canal, and TM is normal.  Eyes: Conjunctivae and EOM are normal. Pupils are equal, round, and reactive to light.  Neck: Normal range of motion. Neck supple.  Cardiovascular: Normal rate.   Pulmonary/Chest: Effort normal. No respiratory distress.  Musculoskeletal: Normal range of motion.  Neurological: She is alert and oriented to person, place, and time. Coordination normal.  Skin: Skin is warm.  Psychiatric: She has a normal mood and affect. Her behavior is normal.  Nursing note and vitals reviewed.   ED Course  Procedures  DIAGNOSTIC STUDIES: Oxygen Saturation is 96% on RA, adequate by my interpretation.    COORDINATION OF CARE: 6:29 PM Discussed treatment plan with pt at bedside which includes to order and prescribe acyclovir. Pt acknowledged and pt agreed to plan.   MDM   Final diagnoses:  Rash  Patient presents for rash on the right side of her face. She states she has a long history of shingles and has had it multiple times in the past. She has been taking acyclovir for at least the past year. She states she is almost out of her antiviral medication and that the vesicle showed up 5 days ago. This does not appear to be shingles. The rash itself is nontender to palpation. There are no signs of shingles within the ocular nerve or the ear that would suggest Ramsay Hunt syndrome. She did not request pain medication. She did not appear to be in any acute distress or pain. Due to the patient's history I have placed her on acyclovir. I also discussed following up with a primary care Taura Lamarre since she has had this multiple times in the past. Patient verbally agrees with the plan.  I personally performed the services described in this documentation, which was scribed in my presence. The recorded information has been  reviewed and is accurate.    Ottie Glazier, PA-C 02/03/15 [place] Yao, MD 02/03/15 [phone]

## 2015-02-03 NOTE — ED Notes (Signed)
Patient with history of shingles, patient with dry scab like rash to right side of face, states right eye twitching and discomfort to right ear.

## 2015-02-03 NOTE — Discharge Instructions (Signed)
Shingles Return for any vision changes or pain in the ear. Follow up with a primary care provider using the resource guide below. Take antivirals as prescribed. Shingles (herpes zoster) is an infection that is caused by the same virus that causes chickenpox (varicella). The infection causes a painful skin rash and fluid-filled blisters, which eventually break open, crust over, and heal. It may occur in any area of the body, but it usually affects only one side of the body or face. The pain of shingles usually lasts about 1 month. However, some people with shingles may develop long-term (chronic) pain in the affected area of the body. Shingles often occurs many years after the person had chickenpox. It is more common:  In people older than 50 years.  In people with weakened immune systems, such as those with HIV, AIDS, or cancer.  In people taking medicines that weaken the immune system, such as transplant medicines.  In people under great stress. CAUSES  Shingles is caused by the varicella zoster virus (VZV), which also causes chickenpox. After a person is infected with the virus, it can remain in the person's body for years in an inactive state (dormant). To cause shingles, the virus reactivates and breaks out as an infection in a nerve root. The virus can be spread from person to person (contagious) through contact with open blisters of the shingles rash. It will only spread to people who have not had chickenpox. When these people are exposed to the virus, they may develop chickenpox. They will not develop shingles. Once the blisters scab over, the person is no longer contagious and cannot spread the virus to others. SIGNS AND SYMPTOMS  Shingles shows up in stages. The initial symptoms may be pain, itching, and tingling in an area of the skin. This pain is usually described as burning, stabbing, or throbbing.In a few days or weeks, a painful red rash will appear in the area where the pain,  itching, and tingling were felt. The rash is usually on one side of the body in a band or belt-like pattern. Then, the rash usually turns into fluid-filled blisters. They will scab over and dry up in approximately 2-3 weeks. Flu-like symptoms may also occur with the initial symptoms, the rash, or the blisters. These may include:  Fever.  Chills.  Headache.  Upset stomach. DIAGNOSIS  Your health care provider will perform a skin exam to diagnose shingles. Skin scrapings or fluid samples may also be taken from the blisters. This sample will be examined under a microscope or sent to a lab for further testing. TREATMENT  There is no specific cure for shingles. Your health care provider will likely prescribe medicines to help you manage the pain, recover faster, and avoid long-term problems. This may include antiviral drugs, anti-inflammatory drugs, and pain medicines. HOME CARE INSTRUCTIONS   Take a cool bath or apply cool compresses to the area of the rash or blisters as directed. This may help with the pain and itching.   Take medicines only as directed by your health care provider.   Rest as directed by your health care provider.  Keep your rash and blisters clean with mild soap and cool water or as directed by your health care provider.  Do not pick your blisters or scratch your rash. Apply an anti-itch cream or numbing creams to the affected area as directed by your health care provider.  Keep your shingles rash covered with a loose bandage (dressing).  Avoid skin contact with:  Babies.   Pregnant women.   Children with eczema.   Elderly people with transplants.   People with chronic illnesses, such as leukemia or AIDS.   Wear loose-fitting clothing to help ease the pain of material rubbing against the rash.  Keep all follow-up visits as directed by your health care provider.If the area involved is on your face, you may receive a referral for a specialist, such as  an eye doctor (ophthalmologist) or an ear, nose, and throat (ENT) doctor. Keeping all follow-up visits will help you avoid eye problems, chronic pain, or disability.  SEEK IMMEDIATE MEDICAL CARE IF:   You have facial pain, pain around the eye area, or loss of feeling on one side of your face.  You have ear pain or ringing in your ear.  You have loss of taste.  Your pain is not relieved with prescribed medicines.   Your redness or swelling spreads.   You have more pain and swelling.  Your condition is worsening or has changed.   You have a fever. MAKE SURE YOU:  Understand these instructions.  Will watch your condition.  Will get help right away if you are not doing well or get worse. Document Released: 04/30/2005 Document Revised: 09/14/2013 Document Reviewed: 12/13/2011 [facility] Patient Information 2015 [place], [place]. This information is not intended to replace advice given to you by your health care provider. Make sure you discuss any questions you have with your health care provider.

## 2015-02-09 ENCOUNTER — Telehealth: Payer: Self-pay | Admitting: *Deleted

## 2015-02-09 NOTE — Telephone Encounter (Signed)
Contacted patient, appointment date/time October 13 @ 2:00pm.  Pt verbalizes understanding.

## 2015-02-09 NOTE — Telephone Encounter (Signed)
Pt contacted the front office and states she needs an appointment for ultrasound to find out what is causing her pain.  Discussed with [facility] and appointment made in clinic but patient is unable to come to that appointment.

## 2015-02-11 ENCOUNTER — Ambulatory Visit: Payer: Medicaid Other | Admitting: Obstetrics & Gynecology

## 2015-02-22 ENCOUNTER — Encounter (HOSPITAL_COMMUNITY): Payer: Self-pay | Admitting: Emergency Medicine

## 2015-02-22 ENCOUNTER — Emergency Department (HOSPITAL_COMMUNITY)
Admission: EM | Admit: 2015-02-22 | Discharge: 2015-02-22 | Discharge status: Against Medical Advice | Payer: Medicaid Other | Attending: Emergency Medicine | Admitting: Emergency Medicine

## 2015-02-22 DIAGNOSIS — H9201 Otalgia, right ear: Secondary | ICD-10-CM | POA: Diagnosis not present

## 2015-02-22 NOTE — ED Notes (Addendum)
Pt from home for eval of right ear pain with itching x3 days, pt states was seen last week for ear pain and dx with shingles in the ear. Pt also reports headache with minimal relief from ibuprofen. Was given acyclovir and finished all of the medications but reports the ear pain is back. VSS, denies any n/v/d or fever, denies any rash on body.

## 2015-02-24 ENCOUNTER — Ambulatory Visit (INDEPENDENT_AMBULATORY_CARE_PROVIDER_SITE_OTHER): Payer: Medicaid Other | Admitting: Obstetrics and Gynecology

## 2015-02-24 VITALS — BP 125/86 | HR 79 | Temp 98.6°F | Wt 243.6 lb

## 2015-02-24 DIAGNOSIS — R102 Pelvic and perineal pain: Secondary | ICD-10-CM | POA: Diagnosis present

## 2015-02-24 MED ORDER — SULFAMETHOXAZOLE-TRIMETHOPRIM 800-160 MG PO TABS: 1.0000 | ORAL_TABLET | Freq: Two times a day (BID) | ORAL | Status: DC

## 2015-02-24 NOTE — Progress Notes (Signed)
Patient ID: Melissa [name], female   DOB: [birth year], 39 y.o.   MRN: [id number] 39 yo [id number] presenting today for the evaluation of vaginal pain. She states she has been experiencing this pain for the past year. Her pain is localized on her perineum and is reproducible with touch. She states the pain is worst when sitting in certain positions and definitely when riding a bicycle. She has taken the Naproxen previously prescribes without any relief in her symptoms. Patient kept stating that there is a strong family history of cancer and she wants to make sure nothing is wrong with her. She is requesting an ultrasound to evaluate the area.  Past Medical History  Diagnosis Date  . Abnormal Pap smear 08/30/2008    LGSIL  . Infertility     Secondary PCOS  . PCOS (polycystic ovarian syndrome)   . Chlamydia   . Fibroid   . Shingles outbreak 11/2011  . Allergy    Past Surgical History  Procedure Laterality Date  . Wisdom tooth extraction    . Right ear surgery    . Laparoscopy  07/05/2011    Procedure: LAPAROSCOPY OPERATIVE;  Surgeon: Osborne Oman, MD;  Location: [facility];  Service: Gynecology;  Laterality: N/A;  . Ear tube removal    . Dilation and curettage of uterus  06/2011    ectopic   Family History  Problem Relation Age of Onset  . Anesthesia problems Neg Hx   . Diabetes Mother   . Hypertension Mother   . Cancer Father   . Fibroids Sister   . Hypertension Sister   . Crohn's disease Brother   . Cancer Brother   . Diabetes Brother   . Hypertension Brother   . Kidney disease Brother    Social History  Substance Use Topics  . Smoking status: Never Smoker   . Smokeless tobacco: Never Used  . Alcohol Use: No   ROS See pertinent in HPI  GENERAL: Well-developed, well-nourished female in no acute distress.  ABDOMEN: Soft, nontender, nondistended. No organomegaly. PELVIC: Normal external female genitalia. Vagina is pink and rugated.  Normal discharge. Normal appearing cervix.  Uterus is normal in size. No adnexal mass or tenderness. Tenderness appreciated over the area of the bartholin glans and along the ischio-spine EXTREMITIES: No cyanosis, clubbing, or edema, 2+ distal pulses.  A/P 39 yo with vaginal pain - Rx Bactrim - Continue with naproxen - Advised to apply warm compress to the are as often as possible - If pain persists, may need to refer to pelvic pain clinic

## 2015-02-24 NOTE — Progress Notes (Signed)
Patient ID: Melissa [name], female   DOB: Mar 10, 1976, 39 y.o.   MRN: [id number] present today for vaginal pain x 1 year.

## 2015-06-08 ENCOUNTER — Emergency Department (HOSPITAL_COMMUNITY)
Admission: EM | Admit: 2015-06-08 | Discharge: 2015-06-08 | Disposition: A | Discharge status: Home / Self Care | Payer: Medicaid Other | Attending: Emergency Medicine | Admitting: Emergency Medicine

## 2015-06-08 ENCOUNTER — Encounter (HOSPITAL_COMMUNITY): Payer: Self-pay | Admitting: Emergency Medicine

## 2015-06-08 DIAGNOSIS — Z86018 Personal history of other benign neoplasm: Secondary | ICD-10-CM | POA: Insufficient documentation

## 2015-06-08 DIAGNOSIS — Z8619 Personal history of other infectious and parasitic diseases: Secondary | ICD-10-CM | POA: Insufficient documentation

## 2015-06-08 DIAGNOSIS — R21 Rash and other nonspecific skin eruption: Secondary | ICD-10-CM | POA: Diagnosis not present

## 2015-06-08 DIAGNOSIS — Z791 Long term (current) use of non-steroidal anti-inflammatories (NSAID): Secondary | ICD-10-CM | POA: Insufficient documentation

## 2015-06-08 DIAGNOSIS — Z792 Long term (current) use of antibiotics: Secondary | ICD-10-CM | POA: Insufficient documentation

## 2015-06-08 DIAGNOSIS — Z8639 Personal history of other endocrine, nutritional and metabolic disease: Secondary | ICD-10-CM | POA: Insufficient documentation

## 2015-06-08 DIAGNOSIS — Z8742 Personal history of other diseases of the female genital tract: Secondary | ICD-10-CM | POA: Diagnosis not present

## 2015-06-08 NOTE — ED Provider Notes (Signed)
CSN: [id number]     Arrival date & time 06/08/15  1535 History  By signing my name below, I, Melissa [name], attest that this documentation has been prepared under the direction and in the presence of [employer], PA-C. Electronically Signed: Hilda [name], ED Scribe. 06/08/2015. 4:03 PM.      Chief Complaint  Patient presents with  . Rash      Patient is a 40 y.o. female presenting with rash. The history is provided by the patient. No language interpreter was used.  Rash   HPI Comments: Melissa [name] is a 40 y.o. female who presents to the Emergency Department complaining of a constant, worsening itching and burning rash to the back of her neck that has been present for two days. Pt reports the rash worsened in appearance starting this morning, and states she did not do anything to treat her symptoms. Pt reports she has no rash anywhere else. Pt also reports she has a hx of frequent shingles and states that this rash feels nothing like it. Pt denies fever, chills, nausea, vomiting, or any other symptoms.   Past Medical History  Diagnosis Date  . Abnormal Pap smear 08/30/2008    LGSIL  . Infertility     Secondary PCOS  . PCOS (polycystic ovarian syndrome)   . Chlamydia   . Fibroid   . Shingles outbreak 11/2011  . Allergy    Past Surgical History  Procedure Laterality Date  . Wisdom tooth extraction    . Right ear surgery    . Laparoscopy  07/05/2011    Procedure: LAPAROSCOPY OPERATIVE;  Surgeon: Osborne Oman, MD;  Location: [facility];  Service: Gynecology;  Laterality: N/A;  . Ear tube removal    . Dilation and curettage of uterus  06/2011    ectopic   Family History  Problem Relation Age of Onset  . Anesthesia problems Neg Hx   . Diabetes Mother   . Hypertension Mother   . Cancer Father   . Fibroids Sister   . Hypertension Sister   . Crohn's disease Brother   . Cancer Brother   . Diabetes Brother   . Hypertension Brother   . Kidney disease Brother    Social  History  Substance Use Topics  . Smoking status: Never Smoker   . Smokeless tobacco: Never Used  . Alcohol Use: No   OB History    Gravida Para Term Preterm AB TAB SAB Ectopic Multiple Living   5 2 2  0 2 0 1 1 0 2     Review of Systems  Skin: Positive for rash.    A complete 10 system review of systems was obtained and all systems are negative except as noted in the HPI and PMH.    Allergies  Spinach  Home Medications   Prior to Admission medications   Medication Sig Start Date End Date Taking? Authorizing Provider  acyclovir (ZOVIRAX) 800 MG tablet Take 1 tablet (800 mg total) by mouth 4 (four) times daily. Patient not taking: Reported on 02/24/2015 02/03/15   Ottie Glazier, PA-C  metroNIDAZOLE (FLAGYL) 500 MG tablet Take 1 tablet (500 mg total) by mouth 2 (two) times daily. Patient not taking: Reported on 01/05/2015 12/14/14   Lavonia Drafts, MD  naproxen (NAPROSYN) 500 MG tablet Take 1 tablet (500 mg total) by mouth 2 (two) times daily with a meal. For 2 weeks then bid prn 01/05/15   Lavonia Drafts, MD  sulfamethoxazole-trimethoprim (BACTRIM DS,SEPTRA DS) 800-160 MG tablet Take  1 tablet by mouth 2 (two) times daily. 02/24/15   Peggy Constant, MD   BP 149/66 mmHg  Pulse 100  Temp(Src) 98.8 F (37.1 C) (Oral)  Resp 16  Ht 5\' 4"  (1.626 m)  Wt 110.224 kg  BMI 41.69 kg/m2  SpO2 100%  LMP 05/27/2015   Physical Exam  Constitutional: She is oriented to person, place, and time. She appears well-developed and well-nourished.  HENT:  Head: Normocephalic and atraumatic.  Cardiovascular: Normal rate.   Pulmonary/Chest: Effort normal.  Abdominal: She exhibits no distension.  Neurological: She is alert and oriented to person, place, and time.  Skin: Skin is warm and dry. Rash noted.  Papular rash to the base of the neck and upper back No spreading to shoulders or lower back No pustules  Psychiatric: She has a normal mood and affect.  Nursing note and vitals  reviewed.   ED Course  Procedures (including critical care time)  DIAGNOSTIC STUDIES: Oxygen Saturation is 100% on room air, normal by my interpretation.    COORDINATION OF CARE: 3:57 PM Discussed treatment plan with pt at bedside and pt agreed to plan.   Labs Review Labs Reviewed - No data to display  Imaging Review No results found. I have personally reviewed and evaluated these images and lab results as part of my medical decision-making.   EKG Interpretation None      MDM   Final diagnoses:  Rash   Labs:  Imaging:  Consults:  Therapeutics:  Discharge Meds:   Assessment/Plan: Patient presents with a papular rash to the back of her neck, no rash anywhere else. She has no systemic signs or symptoms. This just started prior to arrival call. I low suspicion for any infectious etiology, this is likely heat rash. There is a very beginning of the rash, this could progress into something other than he rash. Patient will be instructed to follow-up with her primary care provider 2 days for reevaluation. Patient has no known allergic exposure, no pustules or vesicles noted that would indicate contact dermatitis, shingles. Patient does have a significant past medical history shingles, this is central eyes, and per patient feels different. Symptomatic care instructions given, strict return precautions given, patient verbalized understanding and agreement to today's plan.  I personally performed the services described in this documentation, which was scribed in my presence. The recorded information has been reviewed and is accurate.         Okey Regal, PA-C 06/08/15 [place], MD 06/09/15 1539

## 2015-06-08 NOTE — Discharge Instructions (Signed)
Please read attached information. If you experience any new or worsening signs or symptoms please return to the emergency room for evaluation. Please follow-up with your primary care provider or specialist as discussed. Please use medication prescribed only as directed and discontinue taking if you have any concerning signs or symptoms.   °

## 2015-06-08 NOTE — ED Notes (Signed)
Pt c/o rash with itching to back of neck.  Onset earlier today.  Denies any other symptoms

## 2015-07-03 ENCOUNTER — Emergency Department (HOSPITAL_COMMUNITY)
Admission: EM | Admit: 2015-07-03 | Discharge: 2015-07-04 | Disposition: A | Discharge status: Home / Self Care | Payer: Medicaid Other | Attending: Emergency Medicine | Admitting: Emergency Medicine

## 2015-07-03 ENCOUNTER — Encounter (HOSPITAL_COMMUNITY): Payer: Self-pay | Admitting: Emergency Medicine

## 2015-07-03 DIAGNOSIS — Z8742 Personal history of other diseases of the female genital tract: Secondary | ICD-10-CM | POA: Insufficient documentation

## 2015-07-03 DIAGNOSIS — B349 Viral infection, unspecified: Secondary | ICD-10-CM | POA: Insufficient documentation

## 2015-07-03 DIAGNOSIS — Z8639 Personal history of other endocrine, nutritional and metabolic disease: Secondary | ICD-10-CM | POA: Diagnosis not present

## 2015-07-03 DIAGNOSIS — Z792 Long term (current) use of antibiotics: Secondary | ICD-10-CM | POA: Insufficient documentation

## 2015-07-03 DIAGNOSIS — Z86018 Personal history of other benign neoplasm: Secondary | ICD-10-CM | POA: Diagnosis not present

## 2015-07-03 DIAGNOSIS — R05 Cough: Secondary | ICD-10-CM | POA: Diagnosis present

## 2015-07-03 NOTE — ED Notes (Signed)
Pt. reports nasal congestion , sneezing , rhinorrhea , productive cough with bloody phlegm and chest congestion onset yesterday , denies  fever or chills.

## 2015-07-03 NOTE — ED Notes (Signed)
Called X2 and no answer.

## 2015-07-04 MED ORDER — FLUTICASONE PROPIONATE 50 MCG/ACT NA SUSP: 2.0000 | Freq: Every day | NASAL | Status: DC

## 2015-07-04 NOTE — ED Provider Notes (Signed)
CSN: [id number]     Arrival date & time 07/03/15  2200 History   First MD Initiated Contact with Patient 07/03/15 2357     Chief Complaint  Patient presents with  . Cough  . Nasal Congestion   HPI   40 year old female presents today with upper respiratory complaints. Patient reports that yesterday she woke from a nap with acute onset of sneezing, rhinorrhea, body aches, nasal congestion, and wheezing. She reports blood in her nasal discharge yesterday none today. Patient denies any chest pain, shortness of breath, productive cough,  abdominal pain, nausea vomiting or diarrhea, fever, neck stiffness, altered mental status.   Past Medical History  Diagnosis Date  . Abnormal Pap smear 08/30/2008    LGSIL  . Infertility     Secondary PCOS  . PCOS (polycystic ovarian syndrome)   . Chlamydia   . Fibroid   . Shingles outbreak 11/2011  . Allergy    Past Surgical History  Procedure Laterality Date  . Wisdom tooth extraction    . Right ear surgery    . Laparoscopy  07/05/2011    Procedure: LAPAROSCOPY OPERATIVE;  Surgeon: Osborne Oman, MD;  Location: [facility];  Service: Gynecology;  Laterality: N/A;  . Ear tube removal    . Dilation and curettage of uterus  06/2011    ectopic   Family History  Problem Relation Age of Onset  . Anesthesia problems Neg Hx   . Diabetes Mother   . Hypertension Mother   . Cancer Father   . Fibroids Sister   . Hypertension Sister   . Crohn's disease Brother   . Cancer Brother   . Diabetes Brother   . Hypertension Brother   . Kidney disease Brother    Social History  Substance Use Topics  . Smoking status: Never Smoker   . Smokeless tobacco: Never Used  . Alcohol Use: No   OB History    Gravida Para Term Preterm AB TAB SAB Ectopic Multiple Living   5 2 2  0 2 0 1 1 0 2     Review of Systems  All other systems reviewed and are negative.   Allergies  Spinach  Home Medications   Prior to Admission medications   Medication Sig Start  Date End Date Taking? Authorizing Provider  acyclovir (ZOVIRAX) 800 MG tablet Take 1 tablet (800 mg total) by mouth 4 (four) times daily. Patient not taking: Reported on 02/24/2015 02/03/15   Ottie Glazier, PA-C  fluticasone (FLONASE) 50 MCG/ACT nasal spray Place 2 sprays into both nostrils daily. 07/04/15   Okey Regal, PA-C  metroNIDAZOLE (FLAGYL) 500 MG tablet Take 1 tablet (500 mg total) by mouth 2 (two) times daily. Patient not taking: Reported on 01/05/2015 12/14/14   Lavonia Drafts, MD  naproxen (NAPROSYN) 500 MG tablet Take 1 tablet (500 mg total) by mouth 2 (two) times daily with a meal. For 2 weeks then bid prn 01/05/15   Lavonia Drafts, MD  sulfamethoxazole-trimethoprim (BACTRIM DS,SEPTRA DS) 800-160 MG tablet Take 1 tablet by mouth 2 (two) times daily. 02/24/15   Peggy Constant, MD   BP 122/68 mmHg  Pulse 60  Temp(Src) 97.7 F (36.5 C) (Oral)  Resp 16  Ht 5\' 4"  (1.626 m)  Wt 104.781 kg  BMI 39.63 kg/m2  SpO2 100%  LMP 05/27/2015   Physical Exam  Constitutional: She is oriented to person, place, and time. She appears well-developed and well-nourished.  HENT:  Head: Normocephalic and atraumatic.  Nose: Rhinorrhea present. No mucosal  edema.  Eyes: Conjunctivae are normal. Pupils are equal, round, and reactive to light. Right eye exhibits no discharge. Left eye exhibits no discharge. No scleral icterus.  Neck: Normal range of motion. No JVD present. No tracheal deviation present.  Cardiovascular: Normal rate, regular rhythm, normal heart sounds and intact distal pulses.  Exam reveals no gallop and no friction rub.   No murmur heard. Pulmonary/Chest: Effort normal and breath sounds normal. No stridor. No respiratory distress. She has no wheezes. She has no rales. She exhibits no tenderness.  Neurological: She is alert and oriented to person, place, and time. Coordination normal.  Psychiatric: She has a normal mood and affect. Her behavior is normal. Judgment and  thought content normal.  Nursing note and vitals reviewed.     ED Course  Procedures (including critical care time) Labs Review Labs Reviewed - No data to display  Imaging Review No results found. I have personally reviewed and evaluated these images and lab results as part of my medical decision-making.   EKG Interpretation None      MDM   Final diagnoses:  Viral illness    Labs:  Imaging:  Consults:  Therapeutics:  Discharge Meds:   Assessment/Plan: Well-appearing 40 year old female in no acute distress presents today with upper respiratory complaints. Patient's acute onset could likely represent influenza. Patient is afebrile, nontoxic with near perfect vital signs. She appears to be in no acute distress, tolerating by mouth without difficulty. Patient will be instructed use Flonase as needed for nasal congestion, rest, drink plenty fluids, follow-up with her primary care provider in 3 days if symptoms persist, return to the emergency room immediately if they worsen. Patient verbalized understanding and agreement for today's plan and had no further questions or concerns at time of discharge         Okey Regal, PA-C 07/04/15 [id number]  Veryl Speak, MD 07/04/15 [phone]

## 2015-07-04 NOTE — Discharge Instructions (Signed)
Viral Infections °A viral infection can be caused by different types of viruses. Most viral infections are not serious and resolve on their own. However, some infections may cause severe symptoms and may lead to further complications. °SYMPTOMS °Viruses can frequently cause: °· Minor sore throat. °· Aches and pains. °· Headaches. °· Runny nose. °· Different types of rashes. °· Watery eyes. °· Tiredness. °· Cough. °· Loss of appetite. °· Gastrointestinal infections, resulting in nausea, vomiting, and diarrhea. °These symptoms do not respond to antibiotics because the infection is not caused by bacteria. However, you might catch a bacterial infection following the viral infection. This is sometimes called a "superinfection." Symptoms of such a bacterial infection may include: °· Worsening sore throat with pus and difficulty swallowing. °· Swollen neck glands. °· Chills and a high or persistent fever. °· Severe headache. °· Tenderness over the sinuses. °· Persistent overall ill feeling (malaise), muscle aches, and tiredness (fatigue). °· Persistent cough. °· Yellow, green, or brown mucus production with coughing. °HOME CARE INSTRUCTIONS  °· Only take over-the-counter or prescription medicines for pain, discomfort, diarrhea, or fever as directed by your caregiver. °· Drink enough water and fluids to keep your urine clear or pale yellow. Sports drinks can provide valuable electrolytes, sugars, and hydration. °· Get plenty of rest and maintain proper nutrition. Soups and broths with crackers or rice are fine. °SEEK IMMEDIATE MEDICAL CARE IF:  °· You have severe headaches, shortness of breath, chest pain, neck pain, or an unusual rash. °· You have uncontrolled vomiting, diarrhea, or you are unable to keep down fluids. °· You or your child has an oral temperature above 102° F (38.9° C), not controlled by medicine. °· Your baby is older than 3 months with a rectal temperature of 102° F (38.9° C) or higher. °· Your baby is 3  months old or younger with a rectal temperature of 100.4° F (38° C) or higher. °MAKE SURE YOU:  °· Understand these instructions. °· Will watch your condition. °· Will get help right away if you are not doing well or get worse. °  °This information is not intended to replace advice given to you by your health care provider. Make sure you discuss any questions you have with your health care provider. °  °Document Released: 02/07/2005 Document Revised: 07/23/2011 Document Reviewed: 10/06/2014 °Elsevier Interactive Patient Education ©2016 [employer] ° °

## 2015-07-11 ENCOUNTER — Emergency Department (HOSPITAL_COMMUNITY)
Admission: EM | Admit: 2015-07-11 | Discharge: 2015-07-11 | Disposition: A | Discharge status: Home / Self Care | Payer: Medicaid Other | Attending: Emergency Medicine | Admitting: Emergency Medicine

## 2015-07-11 ENCOUNTER — Encounter (HOSPITAL_COMMUNITY): Payer: Self-pay | Admitting: Emergency Medicine

## 2015-07-11 DIAGNOSIS — Z7951 Long term (current) use of inhaled steroids: Secondary | ICD-10-CM | POA: Insufficient documentation

## 2015-07-11 DIAGNOSIS — R51 Headache: Secondary | ICD-10-CM | POA: Diagnosis present

## 2015-07-11 DIAGNOSIS — Z86018 Personal history of other benign neoplasm: Secondary | ICD-10-CM | POA: Insufficient documentation

## 2015-07-11 DIAGNOSIS — J0110 Acute frontal sinusitis, unspecified: Secondary | ICD-10-CM | POA: Diagnosis not present

## 2015-07-11 DIAGNOSIS — Z8619 Personal history of other infectious and parasitic diseases: Secondary | ICD-10-CM | POA: Insufficient documentation

## 2015-07-11 MED ORDER — AMOXICILLIN 500 MG PO CAPS
500.0000 mg | ORAL_CAPSULE | Freq: Three times a day (TID) | ORAL | Status: DC
Start: 2015-07-11 — End: 2015-08-07

## 2015-07-11 NOTE — ED Notes (Signed)
Declined W/C at D/C and was escorted to lobby by RN. 

## 2015-07-11 NOTE — Discharge Instructions (Signed)

## 2015-07-11 NOTE — ED Notes (Signed)
Pt c/o sinus infection ongoing for 1 week. Pt has taken just about everything over the counter (benedryl, afrin, mucinex, chloraseptic, suphedrine, alka-seltzer plus, zyrtec, saline drops, cough drops) and prescribed flonase without relief.

## 2015-07-11 NOTE — ED Provider Notes (Signed)
CSN: [id number]     Arrival date & time 07/11/15  1037 History  By signing my name below, I, Essence Howell, attest that this documentation has been prepared under the direction and in the presence of Quincy Carnes, PA-C Electronically Signed: Ladene Artist, ED Scribe 07/11/2015 at 12:38 PM.   Chief Complaint  Patient presents with  . Facial Pain   The history is provided by the patient. No language interpreter was used.   HPI Comments: Melissa [name] is a 40 y.o. female who presents to the Emergency Department complaining of persistent sinus pressure for the past 2 weeks. Pt reports associated sneezing and rhinorrhea. Pt was seen on 07/03/15 for the same and was discharged with Flonase; she has been compliant with treatment but denies any relief. She has also tried Sudafed, Zyrtec, Chloraseptic, Afrin, Benadryl, Claritin, Alka-seltzer plus, saline drops, cough drops, warm teas with lemon and honey, warm showers without significant relief. No sick contacts.  No chest pain or SOB.  VSS.  Past Medical History  Diagnosis Date  . Abnormal Pap smear 08/30/2008    LGSIL  . Infertility     Secondary PCOS  . PCOS (polycystic ovarian syndrome)   . Chlamydia   . Fibroid   . Shingles outbreak 11/2011  . Allergy    Past Surgical History  Procedure Laterality Date  . Wisdom tooth extraction    . Right ear surgery    . Laparoscopy  07/05/2011    Procedure: LAPAROSCOPY OPERATIVE;  Surgeon: Osborne Oman, MD;  Location: [facility];  Service: Gynecology;  Laterality: N/A;  . Ear tube removal    . Dilation and curettage of uterus  06/2011    ectopic   Family History  Problem Relation Age of Onset  . Anesthesia problems Neg Hx   . Diabetes Mother   . Hypertension Mother   . Cancer Father   . Fibroids Sister   . Hypertension Sister   . Crohn's disease Brother   . Cancer Brother   . Diabetes Brother   . Hypertension Brother   . Kidney disease Brother    Social History  Substance Use Topics   . Smoking status: Never Smoker   . Smokeless tobacco: Never Used  . Alcohol Use: No   OB History    Gravida Para Term Preterm AB TAB SAB Ectopic Multiple Living   5 2 2  0 2 0 1 1 0 2     Review of Systems  HENT: Positive for rhinorrhea, sinus pressure and sneezing.   All other systems reviewed and are negative.  Allergies  Spinach  Home Medications   Prior to Admission medications   Medication Sig Start Date End Date Taking? Authorizing Provider  acyclovir (ZOVIRAX) 800 MG tablet Take 1 tablet (800 mg total) by mouth 4 (four) times daily. Patient not taking: Reported on 02/24/2015 02/03/15   Ottie Glazier, PA-C  fluticasone (FLONASE) 50 MCG/ACT nasal spray Place 2 sprays into both nostrils daily. 07/04/15   Okey Regal, PA-C  metroNIDAZOLE (FLAGYL) 500 MG tablet Take 1 tablet (500 mg total) by mouth 2 (two) times daily. Patient not taking: Reported on 01/05/2015 12/14/14   Lavonia Drafts, MD  naproxen (NAPROSYN) 500 MG tablet Take 1 tablet (500 mg total) by mouth 2 (two) times daily with a meal. For 2 weeks then bid prn 01/05/15   Lavonia Drafts, MD  sulfamethoxazole-trimethoprim (BACTRIM DS,SEPTRA DS) 800-160 MG tablet Take 1 tablet by mouth 2 (two) times daily. 02/24/15   Peggy Constant,  MD   BP 133/80 mmHg  Pulse 69  Temp(Src) 98.7 F (37.1 C) (Oral)  Resp 20  SpO2 100%  LMP 07/01/2015 (Approximate) Physical Exam  Constitutional: She is oriented to person, place, and time. She appears well-developed and well-nourished. No distress.  HENT:  Head: Normocephalic and atraumatic.  Right Ear: Tympanic membrane and ear canal normal.  Left Ear: Tympanic membrane and ear canal normal.  Nose: Mucosal edema present. Right sinus exhibits maxillary sinus tenderness and frontal sinus tenderness. Left sinus exhibits maxillary sinus tenderness and frontal sinus tenderness.  Mouth/Throat: Uvula is midline, oropharynx is clear and moist and mucous membranes are normal.  No oropharyngeal exudate, posterior oropharyngeal edema, posterior oropharyngeal erythema or tonsillar abscesses.  + nasal congestion, maxillary and sinus pressure Tonsils overall normal in appearance bilaterally without exudate; uvula midline without evidence of peritonsillar abscess; handling secretions appropriately; no difficulty swallowing or speaking; normal phonation without stridor  Eyes: Conjunctivae and EOM are normal. Pupils are equal, round, and reactive to light.  Neck: Normal range of motion. Neck supple.  Cardiovascular: Normal rate, regular rhythm and normal heart sounds.   Pulmonary/Chest: Effort normal and breath sounds normal. No respiratory distress. She has no wheezes.  Abdominal: Soft. Bowel sounds are normal. There is no tenderness. There is no guarding.  Musculoskeletal: Normal range of motion.  Neurological: She is alert and oriented to person, place, and time.  Skin: Skin is warm and dry. She is not diaphoretic.  Psychiatric: She has a normal mood and affect.  Nursing note and vitals reviewed.  ED Course  Procedures (including critical care time) DIAGNOSTIC STUDIES: Oxygen Saturation is 100% on RA, normal by my interpretation.    COORDINATION OF CARE: 12:36 PM-Discussed treatment plan with pt at bedside and pt agreed to plan.   Labs Review Labs Reviewed - No data to display  Imaging Review No results found.   EKG Interpretation None      MDM   Final diagnoses:  Acute frontal sinusitis, recurrence not specified   40 y.o. F here with continues facial pain and sinus pressure.  Denies headache.  Patient is afebrile, non-toxic in appearance.  + sinus pressure and tenderness noted on exam with nasal congestion.  No chest pain or SOB, lungs overall clear.  VSS.  Will treat for sinusitis.  Rx amoxicillin.  Discussed plan with patient, he/she acknowledged understanding and agreed with plan of care.  Return precautions given for new or worsening symptoms.  I  personally performed the services described in this documentation, which was scribed in my presence. The recorded information has been reviewed and is accurate.  Larene Pickett, PA-C 07/11/15 1253  Fredia Sorrow, MD 07/11/15 [phone]

## 2015-08-07 ENCOUNTER — Emergency Department (HOSPITAL_COMMUNITY)
Admission: EM | Admit: 2015-08-07 | Discharge: 2015-08-07 | Disposition: A | Discharge status: Home / Self Care | Payer: Medicaid Other | Attending: Emergency Medicine | Admitting: Emergency Medicine

## 2015-08-07 ENCOUNTER — Encounter (HOSPITAL_COMMUNITY): Payer: Self-pay | Admitting: Emergency Medicine

## 2015-08-07 DIAGNOSIS — Z8742 Personal history of other diseases of the female genital tract: Secondary | ICD-10-CM | POA: Insufficient documentation

## 2015-08-07 DIAGNOSIS — Z792 Long term (current) use of antibiotics: Secondary | ICD-10-CM | POA: Diagnosis not present

## 2015-08-07 DIAGNOSIS — H9201 Otalgia, right ear: Secondary | ICD-10-CM | POA: Diagnosis not present

## 2015-08-07 DIAGNOSIS — Z7951 Long term (current) use of inhaled steroids: Secondary | ICD-10-CM | POA: Diagnosis not present

## 2015-08-07 DIAGNOSIS — Z8619 Personal history of other infectious and parasitic diseases: Secondary | ICD-10-CM | POA: Insufficient documentation

## 2015-08-07 DIAGNOSIS — R0981 Nasal congestion: Secondary | ICD-10-CM | POA: Insufficient documentation

## 2015-08-07 DIAGNOSIS — Z8639 Personal history of other endocrine, nutritional and metabolic disease: Secondary | ICD-10-CM | POA: Insufficient documentation

## 2015-08-07 DIAGNOSIS — H9319 Tinnitus, unspecified ear: Secondary | ICD-10-CM | POA: Diagnosis not present

## 2015-08-07 DIAGNOSIS — Z86018 Personal history of other benign neoplasm: Secondary | ICD-10-CM | POA: Insufficient documentation

## 2015-08-07 DIAGNOSIS — R21 Rash and other nonspecific skin eruption: Secondary | ICD-10-CM

## 2015-08-07 DIAGNOSIS — R51 Headache: Secondary | ICD-10-CM | POA: Insufficient documentation

## 2015-08-07 DIAGNOSIS — R238 Other skin changes: Secondary | ICD-10-CM | POA: Diagnosis present

## 2015-08-07 MED ORDER — ACYCLOVIR 400 MG PO TABS: 800.0000 mg | ORAL_TABLET | Freq: Four times a day (QID) | ORAL | Status: DC

## 2015-08-07 MED ORDER — IBUPROFEN 200 MG PO TABS: 200.0000 mg | ORAL_TABLET | Freq: Four times a day (QID) | ORAL | Status: DC | PRN

## 2015-08-07 NOTE — ED Notes (Signed)
Resident at bedside.  

## 2015-08-07 NOTE — ED Provider Notes (Signed)
CSN: [id number]     Arrival date & time 08/07/15  1733 History   First MD Initiated Contact with Patient 08/07/15 2058     Chief Complaint  Patient presents with  . Herpes Zoster     (Consider location/radiation/quality/duration/timing/severity/associated sxs/prior Treatment) HPI   This is a 40 yo F with no significant medical problems who is here as she thinks she has shingles on her right ear lobe. Patient has been seen multiple times in the ER here for the same reason. She says that day before yesterday she started having a tension like headache all over the head, and it did not go away yesterday, and then this morning she noticed 3 vesicles on her right ear lobe. She reports sharp pain 10/10 non-radiating. She denies sensitivity to light or photophobia.  She also reports associated right ear pain and throbbing, but no drainage or discharge.  She reports stinging on her right ear lobe and tried using bleach and clorox but did not help.   She denies any travel, any rash on her body, any tick bites. She denies use of new detergents or other new food. She denies having pets at home. No one around her has been sick. Patient says she does not have a PCP and tend to bounce around from one provider to other. She has never seen an ENT specialist before.  She reports some kind of surgery behind her right earlobe in 2011. She reports the first episode of "shingles" at the age of 42 and since then it has been happening periodically.   She says that she has been prescribed acyclovir in the past and it usually works. However, she has run out of it for a month.    Past Medical History  Diagnosis Date  . Abnormal Pap smear 08/30/2008    LGSIL  . Infertility     Secondary PCOS  . PCOS (polycystic ovarian syndrome)   . Chlamydia   . Fibroid   . Shingles outbreak 11/2011  . Allergy    Past Surgical History  Procedure Laterality Date  . Wisdom tooth extraction    . Right ear surgery    .  Laparoscopy  07/05/2011    Procedure: LAPAROSCOPY OPERATIVE;  Surgeon: Osborne Oman, MD;  Location: [facility];  Service: Gynecology;  Laterality: N/A;  . Ear tube removal    . Dilation and curettage of uterus  06/2011    ectopic   Family History  Problem Relation Age of Onset  . Anesthesia problems Neg Hx   . Diabetes Mother   . Hypertension Mother   . Cancer Father   . Fibroids Sister   . Hypertension Sister   . Crohn's disease Brother   . Cancer Brother   . Diabetes Brother   . Hypertension Brother   . Kidney disease Brother    Social History  Substance Use Topics  . Smoking status: Never Smoker   . Smokeless tobacco: Never Used  . Alcohol Use: No   OB History    Gravida Para Term Preterm AB TAB SAB Ectopic Multiple Living   5 2 2  0 2 0 1 1 0 2     Review of Systems  Constitutional: Negative for fever, chills, appetite change, fatigue and unexpected weight change.  HENT: Positive for congestion, ear pain and tinnitus. Negative for ear discharge, facial swelling, hearing loss, mouth sores and sore throat.   Eyes: Negative for photophobia, redness and visual disturbance.  Respiratory: Negative for shortness of  breath.   Cardiovascular: Negative for chest pain.  Gastrointestinal: Negative for nausea, vomiting, abdominal pain, diarrhea and abdominal distention.      Allergies  Spinach  Home Medications   Prior to Admission medications   Medication Sig Start Date End Date Taking? Authorizing Provider  acyclovir (ZOVIRAX) 800 MG tablet Take 1 tablet (800 mg total) by mouth 4 (four) times daily. Patient not taking: Reported on 02/24/2015 02/03/15   Ottie Glazier, PA-C  amoxicillin (AMOXIL) 500 MG capsule Take 1 capsule (500 mg total) by mouth 3 (three) times daily. 07/11/15   Larene Pickett, PA-C  fluticasone (FLONASE) 50 MCG/ACT nasal spray Place 2 sprays into both nostrils daily. 07/04/15   Okey Regal, PA-C  metroNIDAZOLE (FLAGYL) 500 MG tablet Take 1 tablet  (500 mg total) by mouth 2 (two) times daily. Patient not taking: Reported on 01/05/2015 12/14/14   Lavonia Drafts, MD  naproxen (NAPROSYN) 500 MG tablet Take 1 tablet (500 mg total) by mouth 2 (two) times daily with a meal. For 2 weeks then bid prn 01/05/15   Lavonia Drafts, MD  sulfamethoxazole-trimethoprim (BACTRIM DS,SEPTRA DS) 800-160 MG tablet Take 1 tablet by mouth 2 (two) times daily. 02/24/15   Peggy Constant, MD   BP 119/93 mmHg  Pulse 70  Temp(Src) 98.3 F (36.8 C)  Resp 18  SpO2 100%  LMP 07/29/2015 Physical Exam  Constitutional: She is oriented to person, place, and time. She appears well-developed and well-nourished.  HENT:  Head: Normocephalic and atraumatic.  Left ear canal normal Right ear canal has no vesicles, but some redness of tympanic membrane- probably from manipulation.  Right earlobe has 2 small vesicles.  No other rashes or vesicles anywhere   Eyes: EOM are normal. Pupils are equal, round, and reactive to light.  Cardiovascular: Normal rate, regular rhythm and intact distal pulses.   No murmur heard. Pulmonary/Chest: Effort normal and breath sounds normal. No respiratory distress. She has no wheezes.  Abdominal: Soft. Bowel sounds are normal. She exhibits no distension. There is no tenderness.  Neurological: She is alert and oriented to person, place, and time.  Skin: Skin is warm and dry. No rash noted. No erythema.    ED Course  Procedures (including critical care time) Labs Review Labs Reviewed - No data to display  Imaging Review No results found. I have personally reviewed and evaluated these images and lab results as part of my medical decision-making.   EKG Interpretation None      MDM   Final diagnoses:  None    Personally, we do not think this is shingles. However, given that the patient has good symptomatic relief, will give her acyclovir for 5 days. No signs of ramsay hunt symptoms. No eye pain or photophobia or  vesicles around eye.  I have asked her to follow up with ENT and establish primary care physician.  It seems to be an allergic type reaction.      Burgess Estelle, MD 08/07/15 2145  Wandra Arthurs, MD 08/09/15 [phone]

## 2015-08-07 NOTE — ED Notes (Signed)
Pt c/o headache yesterday. Today she feels she is having a shingle out break to right ear. Pt also c/o itching to back and feeling like her face is on fire.

## 2015-08-07 NOTE — Discharge Instructions (Signed)
Please call the community [facility] wellness to make appt with primary care doctor Please follow up with ENT doctor Please take acyclovir for 5 days

## 2015-08-11 ENCOUNTER — Ambulatory Visit (INDEPENDENT_AMBULATORY_CARE_PROVIDER_SITE_OTHER): Payer: Medicaid Other | Admitting: Family Medicine

## 2015-08-11 ENCOUNTER — Encounter: Payer: Self-pay | Admitting: Family Medicine

## 2015-08-11 ENCOUNTER — Ambulatory Visit: Payer: Medicaid Other | Admitting: Obstetrics and Gynecology

## 2015-08-11 ENCOUNTER — Telehealth: Payer: Self-pay

## 2015-08-11 VITALS — BP 110/60 | HR 76 | Temp 98.5°F | Ht 64.0 in | Wt 224.0 lb

## 2015-08-11 DIAGNOSIS — R21 Rash and other nonspecific skin eruption: Secondary | ICD-10-CM | POA: Diagnosis present

## 2015-08-11 MED ORDER — FLUCONAZOLE 150 MG PO TABS: 150.0000 mg | ORAL_TABLET | Freq: Once | ORAL | Status: DC

## 2015-08-11 NOTE — Telephone Encounter (Signed)
Called pt and informed her that I would send in an Rx for diflucan.  Pt stated understanding.

## 2015-08-11 NOTE — Telephone Encounter (Signed)
Patient thinks she developed a yeast infection after taking antibiotics she would like to talk to a nurse about it she also has other problems she needs to addres with a nurse. [facility] [address] in [place]. If someone can call her today she would appreciate it.

## 2015-08-11 NOTE — Addendum Note (Signed)
Addended by: Michel Harrow on: 08/11/2015 04:53 PM   Modules accepted: Orders

## 2015-08-11 NOTE — Patient Instructions (Signed)
Return for viral cultures at first sign of your next outbreak.

## 2015-08-11 NOTE — Assessment & Plan Note (Signed)
Recurrent vesicular rash of unknown etiology.  Diagnosis obscured due to home treatment with Clorox.  Currently, rash is dry and scaly without vesicles.  Highly unlikely recurrent shingles given she is immunocompetent.  Possible contact dermatitis versus dermatitis herpetiformis versus HSV.  - Strongly recommended she come to clinic at next outbreak prior to treatment for viral culture - Work note provided; stated she is safe to return to work but avoid direct contact with pregnant employees

## 2015-08-11 NOTE — Progress Notes (Signed)
   Subjective:    Patient ID: Melissa [name], female    DOB: 01/07/1976, 40 y.o.   MRN: [id number]  Seen for Same day visit for   CC: Vesicular rash  She comes in today wanting a work release note; she reports she has had a recurrent outbreak of shingles.  She reports she has had "20" outbreaks of shingles she was 40 years old.  Most recent rash started Sunday with vesicles under her right ear and right cheek.  She reports the rash does extremely itchy as well as very painful.  She treats the rash with topical bleach as well as acyclovir that she received from the emergency department.  She associates these outbreaks with periods of high stress and lack of sleep.  Rash always resolves in 7-10 days with no residual pain or itching.  She does not recall anything else aggravates or causes a rash.  Not affected by sunlight, new detergents, perfumes, lotions.  No family members with similar recurrent rashes.  Denies any fevers, chills.  Does report headache associated with the rash.   Review of Systems   See HPI for ROS. Objective:  BP 110/60 mmHg  Pulse 76  Temp(Src) 98.5 F (36.9 C) (Oral)  Ht 5\' 4"  (1.626 m)  Wt 224 lb (101.606 kg)  BMI 38.43 kg/m2  LMP 07/29/2015  General: NAD HEENT: Right TM with diffuse erythema and mild bulging; no vesicles and ear canal.  Dry scaly erythematous skin over right earlobe with no active vesicles or drainage.  Sclera clear.  No erythematous or scaly patches and hair.     Assessment & Plan:   Rash and nonspecific skin eruption Recurrent vesicular rash of unknown etiology.  Diagnosis obscured due to home treatment with Clorox.  Currently, rash is dry and scaly without vesicles.  Highly unlikely recurrent shingles given she is immunocompetent.  Possible contact dermatitis versus dermatitis herpetiformis versus HSV.  - Strongly recommended she come to clinic at next outbreak prior to treatment for viral culture - Work note provided; stated she is safe to  return to work but avoid direct contact with pregnant employees

## 2015-10-02 ENCOUNTER — Inpatient Hospital Stay (HOSPITAL_COMMUNITY)
Admission: AD | Admit: 2015-10-02 | Discharge: 2015-10-02 | Disposition: A | Discharge status: Home / Self Care | Payer: Medicaid Other | Source: Ambulatory Visit | Attending: Obstetrics and Gynecology | Admitting: Obstetrics and Gynecology

## 2015-10-02 ENCOUNTER — Encounter (HOSPITAL_COMMUNITY): Payer: Self-pay | Admitting: *Deleted

## 2015-10-02 DIAGNOSIS — Z3202 Encounter for pregnancy test, result negative: Secondary | ICD-10-CM | POA: Diagnosis not present

## 2015-10-02 DIAGNOSIS — N898 Other specified noninflammatory disorders of vagina: Secondary | ICD-10-CM | POA: Diagnosis not present

## 2015-10-02 DIAGNOSIS — R102 Pelvic and perineal pain: Secondary | ICD-10-CM

## 2015-10-02 HISTORY — DX: Other specified bacterial agents as the cause of diseases classified elsewhere: N76.0

## 2015-10-02 HISTORY — DX: Other specified bacterial agents as the cause of diseases classified elsewhere: B96.89

## 2015-10-02 LAB — URINE MICROSCOPIC-ADD ON

## 2015-10-02 LAB — URINALYSIS, ROUTINE W REFLEX MICROSCOPIC
Bilirubin Urine: NEGATIVE
GLUCOSE, UA: NEGATIVE mg/dL
KETONES UR: NEGATIVE mg/dL
Leukocytes, UA: NEGATIVE
Nitrite: NEGATIVE
Protein, ur: NEGATIVE mg/dL
Specific Gravity, Urine: 1.015 (ref 1.005–1.030)
pH: 6 (ref 5.0–8.0)

## 2015-10-02 LAB — WET PREP, GENITAL
CLUE CELLS WET PREP: NONE SEEN
SPERM: NONE SEEN
Trich, Wet Prep: NONE SEEN
Yeast Wet Prep HPF POC: NONE SEEN

## 2015-10-02 LAB — POCT PREGNANCY, URINE: PREG TEST UR: NEGATIVE

## 2015-10-02 MED ORDER — HYLAFEM VA SUPP: 1.0000 | Freq: Every evening | VAGINAL | Status: DC

## 2015-10-02 NOTE — MAU Note (Signed)
Pt had normal cycle starting on 18 April. Pt used summers eve douche on May 8.   May 10th she started with lower abd pain and daily  pink, burgundy or brown vag  discharge  with odor since then.  Pt has had recurring BV and nothing treated it sufficiently until the provider gave her boric acid vag capsules.

## 2015-10-02 NOTE — MAU Provider Note (Signed)
Chief Complaint: No chief complaint on file.   First Provider Initiated Contact with Patient 10/02/15 1905     SUBJECTIVE HPI: Melissa [name] is a 40 y.o. [id number] female who presents to Maternity Admissions reporting pink and brown vaginal discharge since 09/21/15 that stopped and started again. Concerned because she douched 09/19/15 follow by immediate, severe vaginal pain which has since resolved.    Gets Gyn care at [facility]. States she get frequent BV that only resolves w/ Boric Acid. Was so concerned w/ bleeding that she couldn't wait to schedule an appointment at [facility] for this problem.    Vaginal bleeding Duration: 11 days Context: After douching Timing: intermittent Course: coming and going Associated signs and symptoms: Pos for episode of vaginal pain (resolved), vaginal discharge. Neg for fever, chills,   Past Medical History  Diagnosis Date  . Abnormal Pap smear 08/30/2008    LGSIL  . Infertility     Secondary PCOS  . PCOS (polycystic ovarian syndrome)   . Chlamydia   . Fibroid   . Shingles outbreak 11/2011  . Allergy   . BV (bacterial vaginosis)     recurring   OB History  Gravida Para Term Preterm AB SAB TAB Ectopic Multiple Living  5 3 3  0 2 1 0 1 0 3    # Outcome Date GA Lbr Len/2nd Weight Sex Delivery Anes PTL Lv  5 Term 06/03/13    M    Y     Comments: System Generated. Please review and update pregnancy details.  4 Term 05/30/12 [redacted]w[redacted]d 17:21 / 00:11 6 lb 10 oz (3.005 kg) [name] EPI  Y  3 Ectopic 07/05/11             Comments: D&C  2 Term 04/26/00     Vag-Spont   Y  1 SAB              Past Surgical History  Procedure Laterality Date  . Wisdom tooth extraction    . Right ear surgery    . Laparoscopy  07/05/2011    Procedure: LAPAROSCOPY OPERATIVE;  Surgeon: Osborne Oman, MD;  Location: [facility];  Service: Gynecology;  Laterality: N/A;  . Ear tube removal    . Dilation and curettage of uterus  06/2011    ectopic   Social History   Social History  .  Marital Status: Married    Spouse Name: N/A  . Number of Children: N/A  . Years of Education: N/A   Occupational History  . Not on file.   Social History Main Topics  . Smoking status: Never Smoker   . Smokeless tobacco: Never Used  . Alcohol Use: No  . Drug Use: No  . Sexual Activity: Not Currently    Birth Control/ Protection: None     Comment: not since Oct of 2015   Other Topics Concern  . Not on file   Social History Narrative   As of 10/22/2013    Unemployed   Sexually active with her husband.   Lives with husband, 70 year old daughter, 56 year old son, and 60 month old son.   Occasional exercise.   Never smoker.   Never drug use or alcohol.   Does not feel at risk for STD and does feel safe in relationship.   No current facility-administered medications on file prior to encounter.   Current Outpatient Prescriptions on File Prior to Encounter  Medication Sig Dispense Refill  . acyclovir (ZOVIRAX) 400 MG tablet  Take 2 tablets (800 mg total) by mouth 4 (four) times daily. (Patient not taking: Reported on 10/02/2015) 40 tablet 0  . fluconazole (DIFLUCAN) 150 MG tablet Take 1 tablet (150 mg total) by mouth once. (Patient not taking: Reported on 10/02/2015) 1 tablet 0  . fluticasone (FLONASE) 50 MCG/ACT nasal spray Place 2 sprays into both nostrils daily. (Patient not taking: Reported on 10/02/2015) 9.9 g 2  . ibuprofen (ADVIL) 200 MG tablet Take 1 tablet (200 mg total) by mouth every 6 (six) hours as needed. (Patient not taking: Reported on 10/02/2015) 30 tablet 0  . [DISCONTINUED] diphenhydrAMINE (BENADRYL) 12.5 MG/5ML liquid Take 12.5 mg by mouth once.     Allergies  Allergen Reactions  . Spinach Itching and Rash    I have reviewed the past Medical Hx, Surgical Hx, Social Hx, Allergies and Medications.   Review of Systems  Constitutional: Negative for fever and chills.  Genitourinary: Positive for vaginal bleeding, vaginal discharge, vaginal pain and menstrual problem.  Negative for dysuria and genital sores.    OBJECTIVE No data found.  Constitutional: Well-developed, well-nourished female in no acute distress.  Cardiovascular: normal rate Respiratory: normal rate and effort.  GI: Abd soft, non-tender.  MS: Extremities nontender, no edema, normal ROM Neurologic: Alert and oriented x 4.  GU: Neg CVAT.  SPECULUM EXAM: NEFG, small amount of bloody, malodorous discharge, cervix clean  BIMANUAL: cervix closed; uterus normal size, no right adnexal tenderness or masses. Mild left adnexal tenderness. No mass. No CMT.  LAB RESULTS Results for orders placed or performed during the hospital encounter of 10/02/15 (from the past 24 hour(s))  Urinalysis, Routine w reflex microscopic (not at [facility])     Status: Abnormal   Collection Time: 10/02/15  6:28 PM  Result Value Ref Range   Color, Urine YELLOW YELLOW   APPearance CLEAR CLEAR   Specific Gravity, Urine 1.015 1.005 - 1.030   pH 6.0 5.0 - 8.0   Glucose, UA NEGATIVE NEGATIVE mg/dL   Hgb urine dipstick TRACE (A) NEGATIVE   Bilirubin Urine NEGATIVE NEGATIVE   Ketones, ur NEGATIVE NEGATIVE mg/dL   Protein, ur NEGATIVE NEGATIVE mg/dL   Nitrite NEGATIVE NEGATIVE   Leukocytes, UA NEGATIVE NEGATIVE  Urine microscopic-add on     Status: Abnormal   Collection Time: 10/02/15  6:28 PM  Result Value Ref Range   Squamous Epithelial / LPF 0-5 (A) NONE SEEN   WBC, UA 0-5 0 - 5 WBC/hpf   RBC / HPF 0-5 0 - 5 RBC/hpf   Bacteria, UA FEW (A) NONE SEEN  Pregnancy, urine POC     Status: None   Collection Time: 10/02/15  6:29 PM  Result Value Ref Range   Preg Test, Ur NEGATIVE NEGATIVE    IMAGING No results found.  MAU COURSE UPT, UA, Wet prep, GC/Chlamydia.  Care of pt turned over to [facility], [place] at 2000.  [place], [place] 10/02/2015 8:05 PM  2025 Pt assessed; no signs of acute distress.  Reviewed wet prep results and plan of care.  Microscopic wet-mount exam shows negative for pathogens, normal  epithelial cells.  A:  Vaginal Discharge Pelvic Pain   P: Discharge home RX Hylafem Schedule outpatient pelvic ultrasound Ibuprofen prn pain  Gwen Pounds, CNM

## 2015-10-03 LAB — GC/CHLAMYDIA PROBE AMP (~~LOC~~) NOT AT ARMC
CHLAMYDIA, DNA PROBE: NEGATIVE
Neisseria Gonorrhea: NEGATIVE

## 2015-10-06 ENCOUNTER — Ambulatory Visit (HOSPITAL_COMMUNITY)
Admission: RE | Admit: 2015-10-06 | Discharge: 2015-10-06 | Disposition: A | Discharge status: Home / Self Care | Payer: Medicaid Other | Source: Ambulatory Visit | Attending: Family | Admitting: Family

## 2015-10-06 ENCOUNTER — Ambulatory Visit (HOSPITAL_COMMUNITY): Payer: Medicaid Other

## 2015-10-06 DIAGNOSIS — D251 Intramural leiomyoma of uterus: Secondary | ICD-10-CM | POA: Insufficient documentation

## 2015-10-06 DIAGNOSIS — R102 Pelvic and perineal pain: Secondary | ICD-10-CM | POA: Insufficient documentation

## 2015-10-06 DIAGNOSIS — N83201 Unspecified ovarian cyst, right side: Secondary | ICD-10-CM | POA: Insufficient documentation

## 2015-10-07 ENCOUNTER — Telehealth: Payer: Self-pay | Admitting: Family

## 2015-10-07 NOTE — Telephone Encounter (Signed)
Called patient and reviewed pelvic ultrasound results. 1.5 cm anterior fibroid and right 3 cm simple ovarian cyst. Bleeding has since stopped.  Also inquired about restarting Metformin to assist in fertility.  Placed on it in the past.

## 2015-11-11 ENCOUNTER — Telehealth: Payer: Self-pay

## 2015-11-11 MED ORDER — FLUCONAZOLE 150 MG PO TABS: 150.0000 mg | ORAL_TABLET | Freq: Once | ORAL | Status: DC

## 2015-11-25 NOTE — Telephone Encounter (Signed)
Pt called and requested a Rx for a yeast infection. Per protocol diflucan ordered.  Pt advised to call back if she needs f/u.

## 2015-11-30 ENCOUNTER — Other Ambulatory Visit (HOSPITAL_COMMUNITY)
Admission: RE | Admit: 2015-11-30 | Discharge: 2015-11-30 | Disposition: A | Discharge status: Home / Self Care | Payer: Medicaid Other | Source: Ambulatory Visit | Attending: Advanced Practice Midwife | Admitting: Advanced Practice Midwife

## 2015-11-30 ENCOUNTER — Ambulatory Visit (INDEPENDENT_AMBULATORY_CARE_PROVIDER_SITE_OTHER): Payer: Medicaid Other | Admitting: Advanced Practice Midwife

## 2015-11-30 ENCOUNTER — Encounter: Payer: Self-pay | Admitting: Advanced Practice Midwife

## 2015-11-30 VITALS — BP 124/69 | HR 62 | Ht 64.0 in | Wt 228.0 lb

## 2015-11-30 DIAGNOSIS — Z113 Encounter for screening for infections with a predominantly sexual mode of transmission: Secondary | ICD-10-CM | POA: Diagnosis not present

## 2015-11-30 DIAGNOSIS — Z1151 Encounter for screening for human papillomavirus (HPV): Secondary | ICD-10-CM | POA: Diagnosis not present

## 2015-11-30 DIAGNOSIS — Z01411 Encounter for gynecological examination (general) (routine) with abnormal findings: Secondary | ICD-10-CM | POA: Diagnosis present

## 2015-11-30 DIAGNOSIS — Z202 Contact with and (suspected) exposure to infections with a predominantly sexual mode of transmission: Secondary | ICD-10-CM | POA: Diagnosis not present

## 2015-11-30 DIAGNOSIS — Z124 Encounter for screening for malignant neoplasm of cervix: Secondary | ICD-10-CM

## 2015-11-30 DIAGNOSIS — Z3202 Encounter for pregnancy test, result negative: Secondary | ICD-10-CM

## 2015-11-30 LAB — POCT PREGNANCY, URINE: PREG TEST UR: NEGATIVE

## 2015-11-30 NOTE — Progress Notes (Signed)
Subjective:     Melissa [name] is a 40 y.o. female here for STD testing.  Current complaints: None. Is in process of divorce.  Personal health questionnaire reviewed: not asked. Under a lot of stress w/ divorce.    Gynecologic History Patient's last menstrual period was 11/22/2015 (within days). Contraception: None. Has had infertility. Now abstaining due to divorce process. Last Pap: 2014?[facility] Kitchen Results were: normal. Unsure if she has had abnormal Paps in the past. Last mammogram: None.  Obstetric History OB History  Gravida Para Term Preterm AB SAB TAB Ectopic Multiple Living  5 3 3  0 2 1 0 1 0 3    # Outcome Date GA Lbr Len/2nd Weight Sex Delivery Anes PTL Lv  5 Term 06/03/13    M    Y     Comments: System Generated. Please review and update pregnancy details.  4 Term 05/30/12 [redacted]w[redacted]d 17:21 / 00:11 6 lb 10 oz (3.005 kg) M Vag-Spont EPI  Y  3 Ectopic 07/05/11             Comments: D&C  2 Term 04/26/00     Vag-Spont   Y  1 SAB                The following portions of the patient's history were reviewed and updated as appropriate: allergies, current medications, past family history, past medical history, past social history, past surgical history and problem list.  Review of Systems Negative for abdominal pain, vaginal discharge, intermenstrual bleeding.    Objective:    BP 124/69 mmHg  Pulse 62  Ht 5\' 4"  (1.626 m)  Wt 228 lb (103.42 kg)  BMI 39.12 kg/m2  LMP 11/22/2015 (Within Days)  General Appearance:    Alert, cooperative, no distress, appears stated age  Head:    Normocephalic, without obvious abnormality, atraumatic  Eyes:    Conjunctiva/corneas clear  Lungs:     Clear to auscultation bilaterally, respirations unlabored  Chest Wall:    No tenderness or deformity   Heart:    Regular rate and rhythm, S1 and S2 normal, no murmur, rub   or gallop  Breast Exam:    Declined   Abdomen:     Soft, non-tender, bowel sounds active all four quadrants,    no masses, no organomegaly   Genitalia:    Normal female without lesion, discharge or tenderness  Skin:   Skin color, texture, turgor normal, no rashes or lesions  Lymph nodes:   Cervical, supraclavicular, and axillary nodes normal  Neurologic Psych:   Alert and oriented 4. Grossly nonfocal.    Anxious, occasionally tearful       Assessment:    Healthy female exam.   1. Possible exposure to STD  - GC/Chlamydia probe amp ([place])not at [facility] - Hepatitis B Surface AntiGEN - RPR - HIV antibody (with reflex) - Wet prep, genital  Screening for cervical cancer - Cytology - PAP    Plan:    Education reviewed: depression evaluation, safe sex/STD prevention and self breast exams. Contraception: abstinence. Follow up in: One year or as needed if symptoms worsen  . Will have Jamie contact patient for depression evaluation

## 2015-11-30 NOTE — Patient Instructions (Signed)
Sexually Transmitted Disease °A sexually transmitted disease (STD) is a disease or infection that may be passed (transmitted) from person to person, usually during sexual activity. This may happen by way of saliva, semen, blood, vaginal mucus, or urine. Common STDs include: °· Gonorrhea. °· Chlamydia. °· Syphilis. °· HIV and AIDS. °· Genital herpes. °· Hepatitis B and C. °· Trichomonas. °· Human papillomavirus (HPV). °· Pubic lice. °· Scabies. °· Mites. °· Bacterial vaginosis. °WHAT ARE CAUSES OF STDs? °An STD may be caused by bacteria, a virus, or parasites. STDs are often transmitted during sexual activity if one person is infected. However, they may also be transmitted through nonsexual means. STDs may be transmitted after:  °· Sexual intercourse with an infected person. °· Sharing sex toys with an infected person. °· Sharing needles with an infected person or using unclean piercing or tattoo needles. °· Having intimate contact with the genitals, mouth, or rectal areas of an infected person. °· Exposure to infected fluids during birth. °WHAT ARE THE SIGNS AND SYMPTOMS OF STDs? °Different STDs have different symptoms. Some people may not have any symptoms. If symptoms are present, they may include: °· Painful or bloody urination. °· Pain in the pelvis, abdomen, vagina, anus, throat, or eyes. °· A skin rash, itching, or irritation. °· Growths, ulcerations, blisters, or sores in the genital and anal areas. °· Abnormal vaginal discharge with or without bad odor. °· Penile discharge in men. °· Fever. °· Pain or bleeding during sexual intercourse. °· Swollen glands in the groin area. °· Yellow skin and eyes (jaundice). This is seen with hepatitis. °· Swollen testicles. °· Infertility. °· Sores and blisters in the mouth. °HOW ARE STDs DIAGNOSED? °To make a diagnosis, your health care provider may: °· Take a medical history. °· Perform a physical exam. °· Take a sample of any discharge to examine. °· Swab the throat,  cervix, opening to the penis, rectum, or vagina for testing. °· Test a sample of your first morning urine. °· Perform blood tests. °· Perform a Pap test, if this applies. °· Perform a colposcopy. °· Perform a laparoscopy. °HOW ARE STDs TREATED? °Treatment depends on the STD. Some STDs may be treated but not cured. °· Chlamydia, gonorrhea, trichomonas, and syphilis can be cured with antibiotic medicine. °· Genital herpes, hepatitis, and HIV can be treated, but not cured, with prescribed medicines. The medicines lessen symptoms. °· Genital warts from HPV can be treated with medicine or by freezing, burning (electrocautery), or surgery. Warts may come back. °· HPV cannot be cured with medicine or surgery. However, abnormal areas may be removed from the cervix, vagina, or vulva. °· If your diagnosis is confirmed, your recent sexual partners need treatment. This is true even if they are symptom-free or have a negative culture or evaluation. They should not have sex until their health care providers say it is okay. °· Your health care provider may test you for infection again 3 months after treatment. °HOW CAN I REDUCE MY RISK OF GETTING AN STD? °Take these steps to reduce your risk of getting an STD: °· Use latex condoms, dental dams, and water-soluble lubricants during sexual activity. Do not use petroleum jelly or oils. °· Avoid having multiple sex partners. °· Do not have sex with someone who has other sex partners °· Do not have sex with anyone you do not know or who is at high risk for an STD. °· Avoid risky sex practices that can break your skin. °· Do not have sex   if you have open sores on your mouth or skin. °· Avoid drinking too much alcohol or taking illegal drugs. Alcohol and drugs can affect your judgment and put you in a vulnerable position. °· Avoid engaging in oral and anal sex acts. °· Get vaccinated for HPV and hepatitis. If you have not received these vaccines in the past, talk to your health care  provider about whether one or both might be right for you. °· If you are at risk of being infected with HIV, it is recommended that you take a prescription medicine daily to prevent HIV infection. This is called pre-exposure prophylaxis (PrEP). You are considered at risk if: °¨ You are a man who has sex with other men (MSM). °¨ You are a heterosexual man or woman and are sexually active with more than one partner. °¨ You take drugs by injection. °¨ You are sexually active with a partner who has HIV. °· Talk with your health care provider about whether you are at high risk of being infected with HIV. If you choose to begin PrEP, you should first be tested for HIV. You should then be tested every 3 months for as long as you are taking PrEP. °WHAT SHOULD I DO IF I THINK I HAVE AN STD? °· See your health care provider. °· Tell your sexual partner(s). They should be tested and treated for any STDs. °· Do not have sex until your health care provider says it is okay. °WHEN SHOULD I GET IMMEDIATE MEDICAL CARE? °Contact your health care provider right away if:  °· You have severe abdominal pain. °· You are a man and notice swelling or pain in your testicles. °· You are a woman and notice swelling or pain in your vagina. °  °This information is not intended to replace advice given to you by your health care provider. Make sure you discuss any questions you have with your health care provider. °  °Document Released: 07/21/2002 Document Revised: 05/21/2014 Document Reviewed: 11/18/2012 °Elsevier Interactive Patient Education ©2016 [employer] ° °

## 2015-12-01 LAB — WET PREP, GENITAL
CLUE CELLS WET PREP: NONE SEEN
Trich, Wet Prep: NONE SEEN
Yeast Wet Prep HPF POC: NONE SEEN

## 2015-12-01 LAB — CYTOLOGY - PAP

## 2015-12-01 LAB — HIV ANTIBODY (ROUTINE TESTING W REFLEX): HIV 1&2 Ab, 4th Generation: NONREACTIVE

## 2015-12-01 LAB — GC/CHLAMYDIA PROBE AMP (~~LOC~~) NOT AT ARMC
CHLAMYDIA, DNA PROBE: NEGATIVE
Neisseria Gonorrhea: NEGATIVE

## 2015-12-01 LAB — HEPATITIS B SURFACE ANTIGEN: Hepatitis B Surface Ag: NEGATIVE

## 2015-12-01 LAB — RPR

## 2016-01-08 ENCOUNTER — Emergency Department (HOSPITAL_BASED_OUTPATIENT_CLINIC_OR_DEPARTMENT_OTHER)
Admission: EM | Admit: 2016-01-08 | Discharge: 2016-01-08 | Disposition: A | Discharge status: Home / Self Care | Payer: Medicaid Other | Attending: Emergency Medicine | Admitting: Emergency Medicine

## 2016-01-08 ENCOUNTER — Encounter (HOSPITAL_BASED_OUTPATIENT_CLINIC_OR_DEPARTMENT_OTHER): Payer: Self-pay | Admitting: Emergency Medicine

## 2016-01-08 DIAGNOSIS — R21 Rash and other nonspecific skin eruption: Secondary | ICD-10-CM

## 2016-01-08 DIAGNOSIS — T7840XA Allergy, unspecified, initial encounter: Secondary | ICD-10-CM | POA: Diagnosis not present

## 2016-01-08 NOTE — ED Triage Notes (Signed)
Patient reports that she has had a SInus infect x 2 weeks, reports with the start of the Sinus infection she had a full body rash

## 2016-01-08 NOTE — ED Provider Notes (Signed)
[place] DEPT [facility] Provider Note   CSN: [id number] Arrival date & time: 01/08/16  1406 By signing my name below, I, Melissa [name], attest that this documentation has been prepared under the direction and in the presence of No att. providers found . Electronically Signed: Dyke [name], Scribe. 01/08/2016. 3:33 PM.    History   Chief Complaint Chief Complaint  Patient presents with  . Rash   HPI Melissa [name] is a 40 y.o. female who presents to the Emergency Department complaining of itching, full body rash onset 2 weeks ago. Per pt, rash is more severe on her abdomen. She has taken Allegra starting today and benadryl topical spray with relief of symptoms, but reports taking zyrtec for 3 days and benadryl pills with no relief.Pt also complains of associated congestion, nasal passage irritation and dryness, and subjective fever. She states her sinuses are so dry that it hurts to breathe through nose. Pt states she has never had problems with her allergies before. No new soaps, lotions, detergents, foods, animals, plants, or medications. Pt states no one at home has a similar rash. She denies any sinus pain, cough, sore throat, sneezing, nausea, vomiting, diarrhea.   The history is provided by the patient. No language interpreter was used.    Past Medical History:  Diagnosis Date  . Abnormal Pap smear 08/30/2008   LGSIL  . Allergy   . BV (bacterial vaginosis)    recurring  . Chlamydia   . Fibroid   . Infertility    Secondary PCOS  . PCOS (polycystic ovarian syndrome)   . Shingles outbreak 11/2011    Patient Active Problem List   Diagnosis Date Noted  . Rash and nonspecific skin eruption 08/11/2015  . Contraceptive management 10/22/2013  . Cold 10/22/2013  . Uterine fibroids, antepartum condition or complication [id number]  . Ruptured ectopic pregnancy 07/05/2011  . Obesity 12/21/2010    Past Surgical History:  Procedure Laterality Date  . DILATION AND CURETTAGE OF  UTERUS  06/2011   ectopic  . EAR TUBE REMOVAL    . LAPAROSCOPY  07/05/2011   Procedure: LAPAROSCOPY OPERATIVE;  Surgeon: Osborne Oman, MD;  Location: [facility] [facility];  Service: Gynecology;  Laterality: N/A;  . right ear surgery    . WISDOM TOOTH EXTRACTION      OB History    Gravida Para Term Preterm AB Living   5 3 3  0 2 3   SAB TAB Ectopic Multiple Live Births   1 0 1 0 3       Home Medications    Prior to Admission medications   Medication Sig Start Date End Date Taking? Authorizing Provider  acyclovir (ZOVIRAX) 200 MG capsule Take 400 mg by mouth 5 (five) times daily.    Historical Provider, MD  ibuprofen (ADVIL) 200 MG tablet Take 1 tablet (200 mg total) by mouth every 6 (six) hours as needed. Patient taking differently: Take 800 mg by mouth every 6 (six) hours as needed.  08/07/15   Burgess Estelle, MD    Family History Family History  Problem Relation Age of Onset  . Diabetes Mother   . Hypertension Mother   . Cancer Father   . Fibroids Sister   . Hypertension Sister   . Crohn's disease Brother   . Cancer Brother   . Diabetes Brother   . Hypertension Brother   . Kidney disease Brother   . Anesthesia problems Neg Hx     Social History Social History  Substance Use Topics  .  Smoking status: Never Smoker  . Smokeless tobacco: Never Used  . Alcohol use No     Allergies   Spinach   Review of Systems Review of Systems 10 systems reviewed and all are negative for acute change except as noted in the HPI.  Physical Exam Updated Vital Signs BP 124/72 (BP Location: Left Arm)   Pulse 71   Temp 99.5 F (37.5 C) (Oral)   Resp 16   Ht 5\' 6"  (1.676 m)   Wt 229 lb (103.9 kg)   LMP 12/17/2015   SpO2 (!) 10%   BMI 36.96 kg/m   Physical Exam  Constitutional: She is oriented to person, place, and time. She appears well-developed and well-nourished. No distress.  HENT:  Head: Normocephalic and atraumatic.  Right Ear: Tympanic membrane and ear canal normal.  Left  Ear: Tympanic membrane and ear canal normal.  Mouth/Throat: Oropharynx is clear and moist.  Moist mucous membranes  Eyes: Conjunctivae are normal. Pupils are equal, round, and reactive to light.  Neck: Neck supple.  Cardiovascular: Normal rate, regular rhythm and normal heart sounds.   No murmur heard. Pulmonary/Chest: Effort normal and breath sounds normal.  Abdominal: Soft. Bowel sounds are normal. She exhibits no distension. There is no tenderness.  Musculoskeletal: She exhibits no edema.  Lymphadenopathy:    She has no cervical adenopathy.  Neurological: She is alert and oriented to person, place, and time.  Fluent speech  Skin: Skin is warm and dry.  Faint scattered macules with several areas of excoriation marks and scabs on chest, abdomen, and lower legs   Psychiatric: She has a normal mood and affect. Judgment normal.  Nursing note and vitals reviewed.    ED Treatments / Results  DIAGNOSTIC STUDIES:  Oxygen Saturation is 100% on RA, normal by my interpretation.    COORDINATION OF CARE:  3:31 PM Discussed treatment plan which includes unscented lotions, allegra, and topical benedryl with pt at bedside and pt agreed to plan.  Labs (all labs ordered are listed, but only abnormal results are displayed) Labs Reviewed - No data to display  EKG  EKG Interpretation None      Radiology No results found.  Procedures Procedures (including critical care time)  Medications Ordered in ED Medications - No data to display  Initial Impression / Assessment and Plan / ED Course  I have reviewed the triage vital signs and the nursing notes.    Clinical Course   Pt w/ 2 weeks of itchy full body rash w/ nasal congestion and irritation. She was well-appearing on exam with normal vital signs. No respiratory complaints. Rash does not appear consistent with bacterial process, infectious etiology, or insect bite. I suspect allergic reaction and have discussed supportive treatment  including continued use of Allegra, Benadryl and topical hydrocortisone as needed, and scentless soaps and lotions. Pt voiced understanding.  Final Clinical Impressions(s) / ED Diagnoses   Final diagnoses:  Rash  Allergic reaction, initial encounter  I personally performed the services described in this documentation, which was scribed in my presence. The recorded information has been reviewed and is accurate.   New Prescriptions Discharge Medication List as of 01/08/2016  3:56 PM       Sharlett Iles, MD 01/08/16 [phone]

## 2016-01-08 NOTE — ED Notes (Signed)
Pt made aware to return if symptoms worsen or if any life threatening symptoms occur.   

## 2016-01-08 NOTE — Discharge Instructions (Signed)
Take allegra, zyrtec, or claritin daily for the next several weeks. Use mild soap and lotion. You may use topical hydrocortisone 1-2 times daily as needed for ongoing itching. You may also take oral benadryl as directed on box.

## 2016-02-11 ENCOUNTER — Emergency Department (HOSPITAL_COMMUNITY)
Admission: EM | Admit: 2016-02-11 | Discharge: 2016-02-11 | Disposition: A | Discharge status: Home / Self Care | Payer: Medicaid Other | Attending: Emergency Medicine | Admitting: Emergency Medicine

## 2016-02-11 ENCOUNTER — Encounter (HOSPITAL_COMMUNITY): Payer: Self-pay | Admitting: Physical Medicine and Rehabilitation

## 2016-02-11 DIAGNOSIS — R21 Rash and other nonspecific skin eruption: Secondary | ICD-10-CM

## 2016-02-11 DIAGNOSIS — T7840XD Allergy, unspecified, subsequent encounter: Secondary | ICD-10-CM

## 2016-02-11 MED ORDER — DEXAMETHASONE SODIUM PHOSPHATE 10 MG/ML IJ SOLN
10.0000 mg | Freq: Once | INTRAMUSCULAR | Status: AC
  Administered 2016-02-11: 10 mg via INTRAVENOUS
  Filled 2016-02-11: qty 1

## 2016-02-11 NOTE — ED Provider Notes (Signed)
[facility] DEPT Provider Note   CSN: [id number] Arrival date & time: 02/11/16  [id number]  By signing my name below, I, Gwenlyn Fudge, attest that this documentation has been prepared under the direction and in the presence of Shary Decamp, PA-C. Electronically Signed: Gwenlyn Fudge, ED Scribe. 02/11/16. 10:19 AM.  History   Chief Complaint Chief Complaint  Patient presents with  . Rash   The history is provided by the patient. No language interpreter was used.   HPI Comments: Melissa [name] is a 40 y.o. female with PMHx of Shingles outbreak who presents to the Emergency Department complaining of gradual onset, persistent pruritic, erythematous rash to the arms bilaterally, chest, back and left sided facial area onset 1 month PTA. Pt has experienced similar symptoms before, but the rash was limited to her lower extremities. She reports associated skin irritation, sinus congestion and green and yellow nasal discharge for 3 weeks.Pt has tried OTC Benadryl, Alegra with no relief to symptoms. Pt denies fever, shortness of breath, ear pain.  Past Medical History:  Diagnosis Date  . Abnormal Pap smear 08/30/2008   LGSIL  . Allergy   . BV (bacterial vaginosis)    recurring  . Chlamydia   . Fibroid   . Infertility    Secondary PCOS  . PCOS (polycystic ovarian syndrome)   . Shingles outbreak 11/2011    Patient Active Problem List   Diagnosis Date Noted  . Rash and nonspecific skin eruption 08/11/2015  . Contraceptive management 10/22/2013  . Cold 10/22/2013  . Uterine fibroids, antepartum condition or complication 99991111  . Ruptured ectopic pregnancy 07/05/2011  . Obesity 12/21/2010    Past Surgical History:  Procedure Laterality Date  . DILATION AND CURETTAGE OF UTERUS  06/2011   ectopic  . EAR TUBE REMOVAL    . LAPAROSCOPY  07/05/2011   Procedure: LAPAROSCOPY OPERATIVE;  Surgeon: Osborne Oman, MD;  Location: [facility];  Service: Gynecology;  Laterality: N/A;  . right ear  surgery    . WISDOM TOOTH EXTRACTION      OB History    Gravida Para Term Preterm AB Living   5 3 3  0 2 3   SAB TAB Ectopic Multiple Live Births   1 0 1 0 3       Home Medications    Prior to Admission medications   Medication Sig Start Date End Date Taking? Authorizing Provider  acyclovir (ZOVIRAX) 200 MG capsule Take 400 mg by mouth 5 (five) times daily.    Historical Provider, MD  ibuprofen (ADVIL) 200 MG tablet Take 1 tablet (200 mg total) by mouth every 6 (six) hours as needed. Patient taking differently: Take 800 mg by mouth every 6 (six) hours as needed.  08/07/15   Burgess Estelle, MD    Family History Family History  Problem Relation Age of Onset  . Diabetes Mother   . Hypertension Mother   . Cancer Father   . Fibroids Sister   . Hypertension Sister   . Crohn's disease Brother   . Cancer Brother   . Diabetes Brother   . Hypertension Brother   . Kidney disease Brother   . Anesthesia problems Neg Hx     Social History Social History  Substance Use Topics  . Smoking status: Never Smoker  . Smokeless tobacco: Never Used  . Alcohol use No     Allergies   Spinach   Review of Systems Review of Systems  Constitutional: Negative for fever.  HENT: Positive for congestion.  Respiratory: Negative for shortness of breath.   Skin: Positive for rash.  All other systems reviewed and are negative.   Physical Exam Updated Vital Signs Ht 5\' 5"  (1.651 m)   Wt 225 lb (102.1 kg)   BMI 37.44 kg/m   Physical Exam  Constitutional: She is oriented to person, place, and time. Vital signs are normal. She appears well-developed and well-nourished.  HENT:  Head: Normocephalic and atraumatic.  Right Ear: Hearing normal. No drainage or tenderness. No mastoid tenderness. Tympanic membrane is bulging. No decreased hearing is noted.  Left Ear: Hearing, tympanic membrane, external ear and ear canal normal.  Mouth/Throat: Uvula is midline, oropharynx is clear and moist and  mucous membranes are normal.  Eyes: Conjunctivae and EOM are normal. Pupils are equal, round, and reactive to light.  Neck: Normal range of motion. Neck supple.  Cardiovascular: Normal rate and regular rhythm.   Pulmonary/Chest: Effort normal and breath sounds normal. No respiratory distress.  Abdominal: She exhibits no distension.  Musculoskeletal: Normal range of motion.  Neurological: She is alert and oriented to person, place, and time.  Skin: Skin is warm and dry. Rash noted.  Scattered macules with several areas of excoriation marks and scabs on chest, abdomen, and lower legs    Psychiatric: She has a normal mood and affect. Her speech is normal and behavior is normal. Thought content normal.  Nursing note and vitals reviewed.  ED Treatments / Results  DIAGNOSTIC STUDIES: Oxygen Saturation is 100% on RA, normal by my interpretation.    COORDINATION OF CARE: 10:18 AM Discussed treatment plan with pt at bedside which includes Injection Decadron and referral to Allergist and pt agreed to plan.  Labs (all labs ordered are listed, but only abnormal results are displayed) Labs Reviewed - No data to display  EKG  EKG Interpretation None       Radiology No results found.  Procedures Procedures (including critical care time)  Medications Ordered in ED Medications - No data to display   Initial Impression / Assessment and Plan / ED Course  I have reviewed the triage vital signs and the nursing notes.  Pertinent labs & imaging results that were available during my care of the patient were reviewed by me and considered in my medical decision making (see chart for details).  Clinical Course  I have reviewed the relevant previous healthcare records. I obtained HPI from historian.  ED Course:  Assessment: Pt is a 39yF with hx Shingles who presents with rash x 1 month. Seen previously and tried OTC remedies as well as topical steroids without relief. On exam, pt in NAD.  Nontoxic/nonseptic appearing. VSS. Afebrile. Lungs CTA. Heart RRR. Abdomen nontender soft. Posterior oropharynx unremarkable. Right ear with bulging TM, with shingles rash noted on ear lobe. Pt currently taking acyclovir. No pain on exam. Given decadron in ED. Plan is to DC home with follow up to Allergist for rash. At time of discharge, Patient is in no acute distress. Vital Signs are stable. Patient is able to ambulate. Patient able to tolerate PO.    Disposition/Plan:  DC Home Additional Verbal discharge instructions given and discussed with patient.  Pt Instructed to f/u with Allergist in the next week for evaluation and treatment of symptoms. Return precautions given Pt acknowledges and agrees with plan  Supervising Physician Dorie Rank, MD  Final Clinical Impressions(s) / ED Diagnoses   Final diagnoses:  Rash  Allergic reaction, subsequent encounter    New Prescriptions New Prescriptions  No medications on file   I personally performed the services described in this documentation, which was scribed in my presence. The recorded information has been reviewed and is accurate.     Shary Decamp, PA-C 02/11/16 [place], MD 02/12/16 [phone]

## 2016-02-11 NOTE — ED Notes (Signed)
Declined W/C at D/C and was escorted to lobby by RN. 

## 2016-02-11 NOTE — Discharge Instructions (Signed)
Please read and follow all provided instructions.  Your diagnoses today include:  1. Rash   2. Allergic reaction, subsequent encounter    Tests performed today include: Vital signs. See below for your results today.   Medications prescribed:  Take as prescribed   Home care instructions:  Follow any educational materials contained in this packet.  Follow-up instructions: Please follow-up with Allergists for further evaluation of symptoms and treatment   Return instructions:  Please return to the Emergency Department if you do not get better, if you get worse, or new symptoms OR  - Fever (temperature greater than 101.35F)  - Bleeding that does not stop with holding pressure to the area    -Severe pain (please note that you may be more sore the day after your accident)  - Chest Pain  - Difficulty breathing  - Severe nausea or vomiting  - Inability to tolerate food and liquids  - Passing out  - Skin becoming red around your wounds  - Change in mental status (confusion or lethargy)  - New numbness or weakness    Please return if you have any other emergent concerns.  Additional Information:  Your vital signs today were: BP 116/76 (BP Location: Left Arm)    Pulse (!) 59    Temp 98.7 F (37.1 C)    Resp 16    Ht 5\' 5"  (1.651 m)    Wt 102.1 kg    SpO2 100%    BMI 37.44 kg/m  If your blood pressure (BP) was elevated above 135/85 this visit, please have this repeated by your doctor within one month. ---------------

## 2016-02-11 NOTE — ED Triage Notes (Signed)
Pt reports generalized rash x1 month. No relief with medications at home. Reports itching and irritation.

## 2016-02-19 ENCOUNTER — Encounter (HOSPITAL_BASED_OUTPATIENT_CLINIC_OR_DEPARTMENT_OTHER): Payer: Self-pay | Admitting: Emergency Medicine

## 2016-02-19 ENCOUNTER — Emergency Department (HOSPITAL_BASED_OUTPATIENT_CLINIC_OR_DEPARTMENT_OTHER): Payer: Medicaid Other

## 2016-02-19 ENCOUNTER — Emergency Department (HOSPITAL_BASED_OUTPATIENT_CLINIC_OR_DEPARTMENT_OTHER)
Admission: EM | Admit: 2016-02-19 | Discharge: 2016-02-19 | Disposition: A | Discharge status: Home / Self Care | Payer: Medicaid Other | Attending: Emergency Medicine | Admitting: Emergency Medicine

## 2016-02-19 DIAGNOSIS — Y9301 Activity, walking, marching and hiking: Secondary | ICD-10-CM | POA: Insufficient documentation

## 2016-02-19 DIAGNOSIS — Y9259 Other trade areas as the place of occurrence of the external cause: Secondary | ICD-10-CM | POA: Insufficient documentation

## 2016-02-19 DIAGNOSIS — Y999 Unspecified external cause status: Secondary | ICD-10-CM | POA: Insufficient documentation

## 2016-02-19 DIAGNOSIS — X58XXXA Exposure to other specified factors, initial encounter: Secondary | ICD-10-CM | POA: Insufficient documentation

## 2016-02-19 DIAGNOSIS — S82891A Other fracture of right lower leg, initial encounter for closed fracture: Secondary | ICD-10-CM

## 2016-02-19 DIAGNOSIS — S82391A Other fracture of lower end of right tibia, initial encounter for closed fracture: Secondary | ICD-10-CM | POA: Insufficient documentation

## 2016-02-19 MED ORDER — HYDROCODONE-ACETAMINOPHEN 5-325 MG PO TABS: 1.0000 | ORAL_TABLET | Freq: Four times a day (QID) | ORAL | 0 refills | Status: DC | PRN

## 2016-02-19 NOTE — ED Triage Notes (Signed)
"   My feet have been have been hurting me for months " Pain to right heel and left entire foot. Pain worse in am upon getting out of bed.

## 2016-02-19 NOTE — ED Notes (Signed)
Patient transported to X-ray 

## 2016-02-19 NOTE — ED Notes (Signed)
Pt states she came to ED for ankle pain and leg pain that she has been having for 3 months, she has taken anything for pain.

## 2016-02-19 NOTE — ED Notes (Signed)
Pt alert, stable, pt given DC instructions and RX x 1 with narcotics precautions, pt denies questions.

## 2016-02-19 NOTE — ED Provider Notes (Signed)
[facility] DEPT [facility] Provider Note   CSN: [id number] Arrival date & time: 02/19/16  1700   By signing my name below, I, Neta Mends, attest that this documentation has been prepared under the direction and in the presence of Blanchie Dessert, MD . Electronically Signed: Neta Mends, ED Scribe. 02/19/2016. 5:40 PM.   History   Chief Complaint Chief Complaint  Patient presents with  . Foot Pain    The history is provided by the patient. No language interpreter was used.   HPI Comments:  Delta Simard is a 40 y.o. female who presents to the Emergency Department complaining of constant, recently worsening right foot pain x3 months. Pt rates the pain at >10/10. Pt also states that she can feel and hear a painful "pop" in her left leg when walking.  Pt reports associated swelling. Pt works at [employer] and does a lot of walking at work. Pt has taken ibuprofen, tried wearing different shoes, and has used epsom salts and elevation with no relief. Pt denies trauma/injruy. Pt denies numbness.   Past Medical History:  Diagnosis Date  . Abnormal Pap smear 08/30/2008   LGSIL  . Allergy   . BV (bacterial vaginosis)    recurring  . Chlamydia   . Fibroid   . Infertility    Secondary PCOS  . PCOS (polycystic ovarian syndrome)   . Shingles outbreak 11/2011    Patient Active Problem List   Diagnosis Date Noted  . Rash and nonspecific skin eruption 08/11/2015  . Contraceptive management 10/22/2013  . Cold 10/22/2013  . Uterine fibroids, antepartum condition or complication 99991111  . Ruptured ectopic pregnancy 07/05/2011  . Obesity 12/21/2010    Past Surgical History:  Procedure Laterality Date  . DILATION AND CURETTAGE OF UTERUS  06/2011   ectopic  . EAR TUBE REMOVAL    . LAPAROSCOPY  07/05/2011   Procedure: LAPAROSCOPY OPERATIVE;  Surgeon: Osborne Oman, MD;  Location: [facility];  Service: Gynecology;  Laterality: N/A;  . right ear surgery    . WISDOM TOOTH  EXTRACTION      OB History    Gravida Para Term Preterm AB Living   5 3 3  0 2 3   SAB TAB Ectopic Multiple Live Births   1 0 1 0 3       Home Medications    Prior to Admission medications   Medication Sig Start Date End Date Taking? Authorizing Provider  acyclovir (ZOVIRAX) 200 MG capsule Take 400 mg by mouth 5 (five) times daily.   Yes Historical Provider, MD  ibuprofen (ADVIL) 200 MG tablet Take 1 tablet (200 mg total) by mouth every 6 (six) hours as needed. Patient taking differently: Take 800 mg by mouth every 6 (six) hours as needed.  08/07/15  Yes Burgess Estelle, MD    Family History Family History  Problem Relation Age of Onset  . Diabetes Mother   . Hypertension Mother   . Cancer Father   . Fibroids Sister   . Hypertension Sister   . Crohn's disease Brother   . Cancer Brother   . Diabetes Brother   . Hypertension Brother   . Kidney disease Brother   . Anesthesia problems Neg Hx     Social History Social History  Substance Use Topics  . Smoking status: Never Smoker  . Smokeless tobacco: Never Used  . Alcohol use No     Allergies   Spinach   Review of Systems Review of Systems  Musculoskeletal:  Positive for arthralgias, gait problem and joint swelling.  Neurological: Negative for numbness.  All other systems reviewed and are negative.    Physical Exam Updated Vital Signs BP 137/78 (BP Location: Left Arm)   Pulse 78   Temp 98.5 F (36.9 C) (Oral)   Resp 20   Ht 5\' 5"  (1.651 m)   Wt 223 lb (101.2 kg)   LMP 02/08/2016 (Exact Date)   SpO2 100%   BMI 37.11 kg/m   Physical Exam  Constitutional: She appears well-developed and well-nourished. No distress.  HENT:  Head: Normocephalic and atraumatic.  Eyes: Conjunctivae are normal.  Cardiovascular: Normal rate.   Pulmonary/Chest: Effort normal.  Abdominal: She exhibits no distension.  Musculoskeletal:  R Foot: Tenderness in the posterior portion of medial and lateral malleolus. No significant  heel tenderness, plantar fascia tenderenss, achilles tenderness. Pain with inversion and eversion of the R ankle.   Neurological: She is alert.  Skin: Skin is warm and dry.  Psychiatric: She has a normal mood and affect.  Nursing note and vitals reviewed.    ED Treatments / Results  DIAGNOSTIC STUDIES:  Oxygen Saturation is 100% on RA, normal by my interpretation.    COORDINATION OF CARE:  5:40 PM Discussed treatment plan with pt at bedside and pt agreed to plan.   Labs (all labs ordered are listed, but only abnormal results are displayed) Labs Reviewed - No data to display  EKG  EKG Interpretation None       Radiology Dg Ankle Complete Right  Result Date: 02/19/2016 CLINICAL DATA:  Chronic right foot and right ankle pain for several months. EXAM: RIGHT ANKLE - COMPLETE 3+ VIEW COMPARISON:  Right ankle radiograph 01/31/2009 FINDINGS: There is a cortical irregularity of the posterior malleolus of the right ankle which may represent an old avulsion fracture. Probably subacute tiny avulsion fracture off of the anterior tibial plafond is also noted, better seen on the foot radiographs. Ankle mortise is preserved. IMPRESSION: Cortical irregularity of the posterior malleolus likely represents healed avulsion fracture. Tiny likely subacute avulsion fracture off of the anterior surface of the tibial plafond also seen. Electronically Signed   By: Fidela Salisbury M.D.   On: 02/19/2016 18:30   Dg Foot Complete Right  Result Date: 02/19/2016 CLINICAL DATA:  Chronic right foot and right ankle pain. EXAM: RIGHT FOOT COMPLETE - 3+ VIEW COMPARISON:  None. FINDINGS: There is no evidence of fracture or dislocation of the right foot. There is no evidence of arthropathy or other focal bone abnormality. Tiny avulsion fracture off of the anterior surface of the tibial plafond is noted. Soft tissues are unremarkable. IMPRESSION: No acute fracture or dislocation identified about the right foot. Tiny  avulsion fracture off of the anterior tibial plafond. Electronically Signed   By: Fidela Salisbury M.D.   On: 02/19/2016 18:32    Procedures Procedures (including critical care time)  Medications Ordered in ED Medications - No data to display   Initial Impression / Assessment and Plan / ED Course  I have reviewed the triage vital signs and the nursing notes.  Pertinent labs & imaging results that were available during my care of the patient were reviewed by me and considered in my medical decision making (see chart for details).  Clinical Course   Patient is a 40 year old female with worsening pain in the back of her right ankle that is worse with walking. This is been ongoing for several months but seems to be getting worse. She is on  her feet all the time for work and has tried multiple inserts in shoes but is not having any improvement. No Achilles tenderness or foot tenderness. X-rays today show a tiny avulsion fracture of the anterior tibial plafond and. There is also a healed posterior malleoli fracture. She denies any injury and unclear the cause of these. She was placed in a Cam Walker and given follow-up with Dr. Barbaraann Barthel.  Final Clinical Impressions(s) / ED Diagnoses   Final diagnoses:  Closed avulsion fracture of right ankle, initial encounter    New Prescriptions New Prescriptions   HYDROCODONE-ACETAMINOPHEN (NORCO/VICODIN) 5-325 MG TABLET    Take 1-2 tablets by mouth every 6 (six) hours as needed.   I personally performed the services described in this documentation, which was scribed in my presence.  The recorded information has been reviewed and considered.     Blanchie Dessert, MD 02/19/16 1900

## 2016-02-27 ENCOUNTER — Ambulatory Visit (INDEPENDENT_AMBULATORY_CARE_PROVIDER_SITE_OTHER): Payer: Self-pay | Admitting: Family Medicine

## 2016-02-27 DIAGNOSIS — S99911A Unspecified injury of right ankle, initial encounter: Secondary | ICD-10-CM

## 2016-02-27 NOTE — Patient Instructions (Signed)
You sustained two small fractures to your ankle. Ice the area for 15 minutes at a time, 3-4 times a day Aleve 2 tabs twice a day with food OR ibuprofen 3 tabs three times a day with food for pain and inflammation as needed. Use laceup ankle brace to help with stability while you recover from this injury. Start theraband strengthening exercises - once a day 3 sets of 10. Consider physical therapy for strengthening and balance exercises. If not improving as expected, we may repeat x-rays or consider further testing like an MRI. Follow up with me in 1 month.

## 2016-02-29 ENCOUNTER — Encounter: Payer: Self-pay | Admitting: Family Medicine

## 2016-02-29 NOTE — Progress Notes (Signed)
PCP: Luiz Blare, DO  Subjective:   HPI: Patient is a 40 y.o. female here for right foot injury.  Patient reports about 3 months ago she believes she was pulling a pallet jack at work when this struck her right ankle posteriorly. She continued to work but has had pain since then. Came to emergency department, told she had a couple fractures and placed in a boot. She is wearing a compression ankle sleeve currently - did not feel comfortable with the boot. Pain is 0/10 at rest but can be sharp both posteriorly and anteriorly. No prior injuries. No skin changes, numbness.  Past Medical History:  Diagnosis Date  . Abnormal Pap smear 08/30/2008   LGSIL  . Allergy   . BV (bacterial vaginosis)    recurring  . Chlamydia   . Fibroid   . Infertility    Secondary PCOS  . PCOS (polycystic ovarian syndrome)   . Shingles outbreak 11/2011    Current Outpatient Prescriptions on File Prior to Visit  Medication Sig Dispense Refill  . acyclovir (ZOVIRAX) 200 MG capsule Take 400 mg by mouth 5 (five) times daily.    Marland Kitchen HYDROcodone-acetaminophen (NORCO/VICODIN) 5-325 MG tablet Take 1-2 tablets by mouth every 6 (six) hours as needed. 15 tablet 0  . [DISCONTINUED] diphenhydrAMINE (BENADRYL) 12.5 MG/5ML liquid Take 12.5 mg by mouth once.     No current facility-administered medications on file prior to visit.     Past Surgical History:  Procedure Laterality Date  . DILATION AND CURETTAGE OF UTERUS  06/2011   ectopic  . EAR TUBE REMOVAL    . LAPAROSCOPY  07/05/2011   Procedure: LAPAROSCOPY OPERATIVE;  Surgeon: Osborne Oman, MD;  Location: [facility];  Service: Gynecology;  Laterality: N/A;  . right ear surgery    . WISDOM TOOTH EXTRACTION      Allergies  Allergen Reactions  . Spinach Itching and Rash    Social History   Social History  . Marital status: Married    Spouse name: N/A  . Number of children: N/A  . Years of education: N/A   Occupational History  . Not on file.    Social History Main Topics  . Smoking status: Never Smoker  . Smokeless tobacco: Never Used  . Alcohol use No  . Drug use: No  . Sexual activity: Not Currently    Birth control/ protection: None     Comment: not since Oct of 2015   Other Topics Concern  . Not on file   Social History Narrative   As of 10/22/2013    Unemployed   Sexually active with her husband.   Lives with husband, 28 year old daughter, 38 year old son, and 24 month old son.   Occasional exercise.   Never smoker.   Never drug use or alcohol.   Does not feel at risk for STD and does feel safe in relationship.    Family History  Problem Relation Age of Onset  . Diabetes Mother   . Hypertension Mother   . Cancer Father   . Fibroids Sister   . Hypertension Sister   . Crohn's disease Brother   . Cancer Brother   . Diabetes Brother   . Hypertension Brother   . Kidney disease Brother   . Anesthesia problems Neg Hx     BP (!) 145/95   Pulse 90   Ht 5\' 6"  (1.676 m)   Wt 227 lb (103 kg)   LMP 02/08/2016 (Exact Date)  BMI 36.64 kg/m   Review of Systems: See HPI above.    Objective:  Physical Exam:  Gen: NAD, comfortable in exam room  Right foot/ankle: No gross deformity, swelling, bruising. Minimal tenderness currently over anterior ankle joint.  No other tenderness including malleoli, base 5th, navicular, fibula. FROM. Negative ant drawer and talar tilt. Negative syndesmotic compression. Negative thompsons. NVI distally.  Left foot/ankle: FROM without pain.    Assessment & Plan:  1. Right ankle injury - independently reviewed radiographs showing healing posterior malleolar fracture and a small anterior tibial avulsion at ankle joint.  At this point about 3 months out will switch to an ASO for stability and focus on regaining strength.  Consider physical therapy.  Theraband strengthening exercises shown.  Icing, aleve or ibuprofen if needed.  F/u in 1 month.

## 2016-03-01 DIAGNOSIS — S99911A Unspecified injury of right ankle, initial encounter: Secondary | ICD-10-CM | POA: Insufficient documentation

## 2016-03-01 NOTE — Assessment & Plan Note (Signed)
independently reviewed radiographs showing healing posterior malleolar fracture and a small anterior tibial avulsion at ankle joint.  At this point about 3 months out will switch to an ASO for stability and focus on regaining strength.  Consider physical therapy.  Theraband strengthening exercises shown.  Icing, aleve or ibuprofen if needed.  F/u in 1 month.

## 2016-03-16 ENCOUNTER — Encounter (HOSPITAL_BASED_OUTPATIENT_CLINIC_OR_DEPARTMENT_OTHER): Payer: Self-pay | Admitting: *Deleted

## 2016-03-16 ENCOUNTER — Emergency Department (HOSPITAL_BASED_OUTPATIENT_CLINIC_OR_DEPARTMENT_OTHER)
Admission: EM | Admit: 2016-03-16 | Discharge: 2016-03-16 | Disposition: A | Discharge status: Home / Self Care | Payer: Self-pay | Attending: Emergency Medicine | Admitting: Emergency Medicine

## 2016-03-16 DIAGNOSIS — H65111 Acute and subacute allergic otitis media (mucoid) (sanguinous) (serous), right ear: Secondary | ICD-10-CM

## 2016-03-16 DIAGNOSIS — H65191 Other acute nonsuppurative otitis media, right ear: Secondary | ICD-10-CM | POA: Insufficient documentation

## 2016-03-16 MED ORDER — IBUPROFEN 800 MG PO TABS: 800.0000 mg | ORAL_TABLET | Freq: Three times a day (TID) | ORAL | 0 refills | Status: DC

## 2016-03-16 MED ORDER — AMOXICILLIN 500 MG PO CAPS: 500.0000 mg | ORAL_CAPSULE | Freq: Three times a day (TID) | ORAL | 0 refills | Status: DC

## 2016-03-16 NOTE — ED Triage Notes (Signed)
Ear drainage and pain from her right ear.

## 2016-03-16 NOTE — ED Provider Notes (Signed)
[facility] Provider Note   CSN: [id number] Arrival date & time: 03/16/16  1422     History   Chief Complaint Chief Complaint  Patient presents with  . Ear Drainage    HPI Melissa [name] is a 40 y.o. female.  HPI   40 year old female presenting with complaint of right ear pain. Patient reports gradual onset of right ear pain throughout the day today. She described pain as a throbbing achy sensation, 12 out of 10, intense, with some drainage. Pain feels similar to the infection that she had in the past. States that she has had palpitation with years throughout her youth requiring surgery and tubes many years ago. Her daughter had a recent ear infection. She currently denies having fever, runny nose sneezing coughing sore throat or neck pain. She denies swimming in lakes, ponds, or pool. She denies using Q-tips to clean the ear. No specific treatment tried. Denies any recent trauma.  Past Medical History:  Diagnosis Date  . Abnormal Pap smear 08/30/2008   LGSIL  . Allergy   . BV (bacterial vaginosis)    recurring  . Chlamydia   . Fibroid   . Infertility    Secondary PCOS  . PCOS (polycystic ovarian syndrome)   . Shingles outbreak 11/2011    Patient Active Problem List   Diagnosis Date Noted  . Right ankle injury 03/01/2016  . Rash and nonspecific skin eruption 08/11/2015  . Contraceptive management 10/22/2013  . Cold 10/22/2013  . Uterine fibroids, antepartum condition or complication 99991111  . Ruptured ectopic pregnancy 07/05/2011  . Obesity 12/21/2010    Past Surgical History:  Procedure Laterality Date  . DILATION AND CURETTAGE OF UTERUS  06/2011   ectopic  . EAR TUBE REMOVAL    . LAPAROSCOPY  07/05/2011   Procedure: LAPAROSCOPY OPERATIVE;  Surgeon: Osborne Oman, MD;  Location: [facility];  Service: Gynecology;  Laterality: N/A;  . right ear surgery    . WISDOM TOOTH EXTRACTION      OB History    Gravida Para Term Preterm AB Living   5 3 3   0 2 3   SAB TAB Ectopic Multiple Live Births   1 0 1 0 3       Home Medications    Prior to Admission medications   Medication Sig Start Date End Date Taking? Authorizing Provider  acyclovir (ZOVIRAX) 200 MG capsule Take 400 mg by mouth 5 (five) times daily.    Historical Provider, MD  HYDROcodone-acetaminophen (NORCO/VICODIN) 5-325 MG tablet Take 1-2 tablets by mouth every 6 (six) hours as needed. 02/19/16   Blanchie Dessert, MD    Family History Family History  Problem Relation Age of Onset  . Diabetes Mother   . Hypertension Mother   . Cancer Father   . Fibroids Sister   . Hypertension Sister   . Crohn's disease Brother   . Cancer Brother   . Diabetes Brother   . Hypertension Brother   . Kidney disease Brother   . Anesthesia problems Neg Hx     Social History Social History  Substance Use Topics  . Smoking status: Never Smoker  . Smokeless tobacco: Never Used  . Alcohol use No     Allergies   Spinach   Review of Systems Review of Systems  Constitutional: Negative for fever.  HENT: Positive for ear pain. Negative for dental problem.   Skin: Negative for rash and wound.  Neurological: Negative for headaches.     Physical Exam  Updated Vital Signs BP 120/75   Pulse 75   Temp 98.4 F (36.9 C) (Oral)   Resp 20   Ht 5\' 6"  (1.676 m)   Wt 100.2 kg   LMP 03/05/2016   SpO2 100%   BMI 35.67 kg/m   Physical Exam  Constitutional: She appears well-developed and well-nourished. No distress.  HENT:  Head: Atraumatic.  Right ear: TM is erythematous with mild effusion but no perforation. No tenderness to manipulations of a right ear lobe. No evidence of mastoiditis or TMJ. Left ear: Scar noted in TM with a small perforation but not erythematous   Eyes: Conjunctivae are normal.  Neck: Normal range of motion. Neck supple.  Cardiovascular: Normal rate and regular rhythm.   Pulmonary/Chest: Effort normal and breath sounds normal.  Lymphadenopathy:    She has  cervical adenopathy.  Neurological: She is alert.  Skin: No rash noted.  Psychiatric: She has a normal mood and affect.  Nursing note and vitals reviewed.    ED Treatments / Results  Labs (all labs ordered are listed, but only abnormal results are displayed) Labs Reviewed - No data to display  EKG  EKG Interpretation None       Radiology No results found.  Procedures Procedures (including critical care time)  Medications Ordered in ED Medications - No data to display   Initial Impression / Assessment and Plan / ED Course  I have reviewed the triage vital signs and the nursing notes.  Pertinent labs & imaging results that were available during my care of the patient were reviewed by me and considered in my medical decision making (see chart for details).  Clinical Course    BP 120/75   Pulse 75   Temp 98.4 F (36.9 C) (Oral)   Resp 20   Ht 5\' 6"  (1.676 m)   Wt 100.2 kg   LMP 03/05/2016   SpO2 100%   BMI 35.67 kg/m    Final Clinical Impressions(s) / ED Diagnoses   Final diagnoses:  Acute mucoid otitis media of right ear    New Prescriptions New Prescriptions   AMOXICILLIN (AMOXIL) 500 MG CAPSULE    Take 1 capsule (500 mg total) by mouth 3 (three) times daily.   IBUPROFEN (ADVIL,MOTRIN) 800 MG TABLET    Take 1 tablet (800 mg total) by mouth 3 (three) times daily.   3:15 PM Patient presents with right ear pain. On exam, her ear is erythematous and consistence with otitis media. No evidence of perforation to the affected ear. Patient will be discharged with antibiotic and ENT referral. Return precaution discussed. Recommend ibuprofen as needed for pain.   Domenic Moras, PA-C 03/16/16 1518    Veryl Speak, MD 03/16/16 [id number]

## 2016-03-19 ENCOUNTER — Ambulatory Visit: Payer: Self-pay | Admitting: *Deleted

## 2016-03-19 DIAGNOSIS — Z3202 Encounter for pregnancy test, result negative: Secondary | ICD-10-CM

## 2016-03-19 LAB — POCT PREGNANCY, URINE: PREG TEST UR: NEGATIVE

## 2016-03-19 NOTE — Progress Notes (Signed)
Pt in for pregnancy test. Results are negative. Pt states that she is feeling movement. LMP was 03/05/16. Advised patient that if symptoms do not improve or she misses a period to contact us.

## 2016-03-26 ENCOUNTER — Ambulatory Visit (INDEPENDENT_AMBULATORY_CARE_PROVIDER_SITE_OTHER): Payer: Self-pay | Admitting: Obstetrics & Gynecology

## 2016-03-26 ENCOUNTER — Encounter: Payer: Self-pay | Admitting: Obstetrics & Gynecology

## 2016-03-26 VITALS — BP 123/83 | HR 80 | Ht 67.0 in | Wt 227.0 lb

## 2016-03-26 DIAGNOSIS — N760 Acute vaginitis: Secondary | ICD-10-CM

## 2016-03-26 DIAGNOSIS — N898 Other specified noninflammatory disorders of vagina: Secondary | ICD-10-CM

## 2016-03-26 MED ORDER — NYSTATIN 100000 UNIT/GM EX CREA: 1.0000 "application " | TOPICAL_CREAM | Freq: Two times a day (BID) | CUTANEOUS | 1 refills | Status: DC

## 2016-03-26 MED ORDER — FLUCONAZOLE 150 MG PO TABS: 150.0000 mg | ORAL_TABLET | ORAL | 3 refills | Status: DC

## 2016-03-26 NOTE — Progress Notes (Signed)
   GYNECOLOGY VISIT NOTE  History:  40 y.o. [id number] here today for evaluation of abnormal vaginal discharge and intense vulvar irritation x 1 week.  She recently was on antibiotics for ear infection.  She scratched so much, she had some bleeding from her vulva. She self-treated with apple cider vinegar douche and this helped a bit but still has symptoms.  She denies any abnormal pelvic pain or other concerns.   Past Medical History:  Diagnosis Date  . Abnormal Pap smear 08/30/2008   LGSIL  . Allergy   . BV (bacterial vaginosis)    recurring  . Chlamydia   . Fibroid   . Infertility    Secondary PCOS  . PCOS (polycystic ovarian syndrome)   . Shingles outbreak 11/2011    Past Surgical History:  Procedure Laterality Date  . DILATION AND CURETTAGE OF UTERUS  06/2011   ectopic  . EAR TUBE REMOVAL    . LAPAROSCOPY  07/05/2011   Procedure: LAPAROSCOPY OPERATIVE;  Surgeon: Osborne Oman, MD;  Location: [facility];  Service: Gynecology;  Laterality: N/A;  . right ear surgery    . WISDOM TOOTH EXTRACTION      The following portions of the patient's history were reviewed and updated as appropriate: allergies, current medications, past family history, past medical history, past social history, past surgical history and problem list.   Health Maintenance:  Normal pap and negative HRHPV on 11/30/2015.    Review of Systems:  Pertinent items noted in HPI and remainder of comprehensive ROS otherwise negative.   Objective:  Physical Exam BP 123/83   Pulse 80   Ht 5\' 7"  (1.702 m)   Wt 227 lb (103 kg)   LMP 03/05/2016 (Exact Date)   BMI 35.55 kg/m  CONSTITUTIONAL: Well-developed, well-nourished female in no acute distress.  HENT:  Normocephalic, atraumatic. External right and left ear normal. Oropharynx is clear and moist EYES: Conjunctivae and EOM are normal. Pupils are equal, round, and reactive to light. No scleral icterus.  NECK: Normal range of motion, supple, no masses SKIN: Skin is  warm and dry. No rash noted. Not diaphoretic. No erythema. No pallor. NEUROLOGIC: Alert and oriented to person, place, and time. Normal reflexes, muscle tone coordination. No cranial nerve deficit noted. PSYCHIATRIC: Normal mood and affect. Normal behavior. Normal judgment and thought content. CARDIOVASCULAR: Normal heart rate noted RESPIRATORY: Effort and breath sounds normal, no problems with respiration noted ABDOMEN: Soft, no distention noted.   PELVIC: Normal appearing external genitalia with areas of excoriation noted on bilateral labia minor with surrounding erythema.  Scant white discharge noted, wet prep obtained MUSCULOSKELETAL: Normal range of motion. No edema noted.   Assessment & Plan:  1. Vaginal discharge 2. Vulvovaginitis Will follow up wet prep. Antifungals prescribed.  - Wet prep, genital - fluconazole (DIFLUCAN) 150 MG tablet; Take 1 tablet (150 mg total) by mouth every 3 (three) days. For three doses  Dispense: 3 tablet; Refill: 3 - nystatin cream (MYCOSTATIN); Apply 1 application topically 2 (two) times daily.  Dispense: 30 g; Refill: 1  Routine preventative health maintenance measures emphasized. Please refer to After Visit Summary for other counseling recommendations.   Return if symptoms worsen or fail to improve.   Total face-to-face time with patient: 15 minutes. Over 50% of encounter was spent on counseling and coordination of care.   Melissa Schneiders, MD, [facility], [facility] for [employer], [place]

## 2016-03-26 NOTE — Patient Instructions (Signed)

## 2016-03-27 ENCOUNTER — Other Ambulatory Visit: Payer: Self-pay | Admitting: Obstetrics and Gynecology

## 2016-03-27 LAB — WET PREP, GENITAL
TRICH WET PREP: NONE SEEN
Yeast Wet Prep HPF POC: NONE SEEN

## 2016-03-27 MED ORDER — METRONIDAZOLE 500 MG PO TABS: 500.0000 mg | ORAL_TABLET | Freq: Two times a day (BID) | ORAL | 0 refills | Status: AC

## 2016-03-28 ENCOUNTER — Encounter: Payer: Self-pay | Admitting: *Deleted

## 2016-04-10 ENCOUNTER — Encounter (HOSPITAL_BASED_OUTPATIENT_CLINIC_OR_DEPARTMENT_OTHER): Payer: Self-pay | Admitting: *Deleted

## 2016-04-10 ENCOUNTER — Emergency Department (HOSPITAL_BASED_OUTPATIENT_CLINIC_OR_DEPARTMENT_OTHER)
Admission: EM | Admit: 2016-04-10 | Discharge: 2016-04-10 | Disposition: A | Discharge status: Home / Self Care | Payer: Self-pay | Attending: Emergency Medicine | Admitting: Emergency Medicine

## 2016-04-10 DIAGNOSIS — R42 Dizziness and giddiness: Secondary | ICD-10-CM | POA: Insufficient documentation

## 2016-04-10 DIAGNOSIS — Z791 Long term (current) use of non-steroidal anti-inflammatories (NSAID): Secondary | ICD-10-CM | POA: Insufficient documentation

## 2016-04-10 DIAGNOSIS — R55 Syncope and collapse: Secondary | ICD-10-CM | POA: Insufficient documentation

## 2016-04-10 LAB — CBC WITH DIFFERENTIAL/PLATELET
Basophils Absolute: 0 10*3/uL (ref 0.0–0.1)
Basophils Relative: 1 %
Eosinophils Absolute: 0.2 10*3/uL (ref 0.0–0.7)
Eosinophils Relative: 4 %
HEMATOCRIT: 40.2 % (ref 36.0–46.0)
HEMOGLOBIN: 13.3 g/dL (ref 12.0–15.0)
LYMPHS ABS: 1.8 10*3/uL (ref 0.7–4.0)
LYMPHS PCT: 39 %
MCH: 28.6 pg (ref 26.0–34.0)
MCHC: 33.1 g/dL (ref 30.0–36.0)
MCV: 86.5 fL (ref 78.0–100.0)
MONOS PCT: 10 %
Monocytes Absolute: 0.5 10*3/uL (ref 0.1–1.0)
NEUTROS PCT: 46 %
Neutro Abs: 2.2 10*3/uL (ref 1.7–7.7)
Platelets: 315 10*3/uL (ref 150–400)
RBC: 4.65 MIL/uL (ref 3.87–5.11)
RDW: 14.3 % (ref 11.5–15.5)
WBC: 4.6 10*3/uL (ref 4.0–10.5)

## 2016-04-10 LAB — COMPREHENSIVE METABOLIC PANEL
ALK PHOS: 47 U/L (ref 38–126)
ALT: 14 U/L (ref 14–54)
AST: 22 U/L (ref 15–41)
Albumin: 3.7 g/dL (ref 3.5–5.0)
Anion gap: 9 (ref 5–15)
BILIRUBIN TOTAL: 0.4 mg/dL (ref 0.3–1.2)
BUN: 16 mg/dL (ref 6–20)
CALCIUM: 9.2 mg/dL (ref 8.9–10.3)
CO2: 25 mmol/L (ref 22–32)
CREATININE: 0.78 mg/dL (ref 0.44–1.00)
Chloride: 103 mmol/L (ref 101–111)
Glucose, Bld: 74 mg/dL (ref 65–99)
Potassium: 3.7 mmol/L (ref 3.5–5.1)
Sodium: 137 mmol/L (ref 135–145)
TOTAL PROTEIN: 6.7 g/dL (ref 6.5–8.1)

## 2016-04-10 LAB — URINALYSIS, ROUTINE W REFLEX MICROSCOPIC
Glucose, UA: NEGATIVE mg/dL
Hgb urine dipstick: NEGATIVE
KETONES UR: NEGATIVE mg/dL
Leukocytes, UA: NEGATIVE
Nitrite: NEGATIVE
PH: 5.5 (ref 5.0–8.0)
PROTEIN: NEGATIVE mg/dL
Specific Gravity, Urine: 1.031 — ABNORMAL HIGH (ref 1.005–1.030)

## 2016-04-10 LAB — PREGNANCY, URINE: Preg Test, Ur: NEGATIVE

## 2016-04-10 MED ORDER — SODIUM CHLORIDE 0.9 % IV BOLUS (SEPSIS)
500.0000 mL | Freq: Once | INTRAVENOUS | Status: AC
  Administered 2016-04-10: 500 mL via INTRAVENOUS
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
## 2016-04-10 NOTE — Discharge Instructions (Signed)

## 2016-04-10 NOTE — ED Triage Notes (Addendum)
Pt c/o "lightheaded" x 3 days reports " I think I almost passed out twice today"

## 2016-04-18 ENCOUNTER — Other Ambulatory Visit: Payer: Self-pay

## 2016-04-18 DIAGNOSIS — Z3200 Encounter for pregnancy test, result unknown: Secondary | ICD-10-CM

## 2016-04-19 LAB — HCG, QUANTITATIVE, PREGNANCY

## 2016-04-23 ENCOUNTER — Emergency Department (HOSPITAL_BASED_OUTPATIENT_CLINIC_OR_DEPARTMENT_OTHER)
Admission: EM | Admit: 2016-04-23 | Discharge: 2016-04-23 | Disposition: A | Discharge status: Home / Self Care | Payer: Self-pay | Attending: Emergency Medicine | Admitting: Emergency Medicine

## 2016-04-23 ENCOUNTER — Encounter (HOSPITAL_BASED_OUTPATIENT_CLINIC_OR_DEPARTMENT_OTHER): Payer: Self-pay | Admitting: *Deleted

## 2016-04-23 DIAGNOSIS — H6691 Otitis media, unspecified, right ear: Secondary | ICD-10-CM | POA: Insufficient documentation

## 2016-04-23 DIAGNOSIS — Z791 Long term (current) use of non-steroidal anti-inflammatories (NSAID): Secondary | ICD-10-CM | POA: Insufficient documentation

## 2016-04-23 DIAGNOSIS — H6690 Otitis media, unspecified, unspecified ear: Secondary | ICD-10-CM

## 2016-04-23 MED ORDER — AMOXICILLIN-POT CLAVULANATE 875-125 MG PO TABS: 1.0000 | ORAL_TABLET | Freq: Two times a day (BID) | ORAL | 0 refills | Status: DC

## 2016-04-23 MED ORDER — FLUCONAZOLE 200 MG PO TABS: 200.0000 mg | ORAL_TABLET | Freq: Every day | ORAL | 0 refills | Status: AC

## 2016-04-23 NOTE — Discharge Instructions (Signed)
You were seen in the ED today with an ear infection. We are starting you on a medication to treat the infection. Since this is the 2nd infection in the last 6 weeks we would like for you to follow up with an ENT physician. I have provided the contact information on this paperwork.   Return to the ED with any sudden severe headache, fever, chills, difficulty breathing, or chest pain.

## 2016-04-23 NOTE — ED Provider Notes (Signed)
Emergency Department Provider Note   I have reviewed the triage vital signs and the nursing notes.   HISTORY  Chief Complaint Otalgia   HPI Melissa [name] is a 40 y.o. female with PMH of fibroids and PCOS presents to the emergency department for evaluation of 4 days of ear fullness and muffled sounds. Patient denies fever. She reports an ear infection and early November that required an emergency department presentation. She was started on amoxicillin and took it for several days but had to stop because of a vaginal yeast infection and gastrointestinal upset. She was referred to ENT at that time but was unable to go because of work obligations.   She was again seen in the emergency department by myself in late November for questionable presyncope symptoms. No ear discomfort at that time. She denies any return of presyncope has been trying to stay hydrated. Her ear pain began 4 days ago. She denies any injury to the ear. She states that sounds sound "muffled." No neck pain, HA, or vision changes.    Past Medical History:  Diagnosis Date  . Abnormal Pap smear 08/30/2008   LGSIL  . Allergy   . BV (bacterial vaginosis)    recurring  . Chlamydia   . Fibroid   . Infertility    Secondary PCOS  . PCOS (polycystic ovarian syndrome)   . Shingles outbreak 11/2011    Patient Active Problem List   Diagnosis Date Noted  . Right ankle injury 03/01/2016  . Rash and nonspecific skin eruption 08/11/2015  . Contraceptive management 10/22/2013  . Cold 10/22/2013  . Uterine fibroids, antepartum condition or complication 99991111  . Ruptured ectopic pregnancy 07/05/2011  . Obesity 12/21/2010    Past Surgical History:  Procedure Laterality Date  . DILATION AND CURETTAGE OF UTERUS  06/2011   ectopic  . EAR TUBE REMOVAL    . LAPAROSCOPY  07/05/2011   Procedure: LAPAROSCOPY OPERATIVE;  Surgeon: Osborne Oman, MD;  Location: [facility];  Service: Gynecology;  Laterality: N/A;  . right  ear surgery    . WISDOM TOOTH EXTRACTION      Current Outpatient Rx  . Order #: [id number] Class: Print  . Order #: [id number] Class: Print  . Order #: [id number] Class: Print  . Order #: [id number] Class: Normal    Allergies Spinach  Family History  Problem Relation Age of Onset  . Diabetes Mother   . Hypertension Mother   . Cancer Father   . Fibroids Sister   . Hypertension Sister   . Crohn's disease Brother   . Cancer Brother   . Diabetes Brother   . Hypertension Brother   . Kidney disease Brother   . Anesthesia problems Neg Hx     Social History Social History  Substance Use Topics  . Smoking status: Never Smoker  . Smokeless tobacco: Never Used  . Alcohol use No    Review of Systems  Constitutional: No fever/chills Eyes: No visual changes. ENT: No sore throat. Positive ear fullness/muffled sounds.  Cardiovascular: Denies chest pain. Respiratory: Denies shortness of breath. Gastrointestinal: No abdominal pain.  No nausea, no vomiting.  No diarrhea.  No constipation. Genitourinary: Negative for dysuria. Musculoskeletal: Negative for back pain. Skin: Negative for rash. Neurological: Negative for headaches, focal weakness or numbness.  10-point ROS otherwise negative.  ____________________________________________   PHYSICAL EXAM:  VITAL SIGNS: ED Triage Vitals  Enc Vitals Group     BP 04/23/16 1019 132/81     Pulse Rate 04/23/16 1019 73  Resp 04/23/16 1019 18     Temp 04/23/16 1019 98.1 F (36.7 C)     Temp Source 04/23/16 1019 Oral     SpO2 04/23/16 1019 100 %     Weight 04/23/16 1019 224 lb (101.6 kg)     Height 04/23/16 1019 5\' 6"  (1.676 m)     Pain Score 04/23/16 1026 10   Constitutional: Alert and oriented. Well appearing and in no acute distress. Eyes: Conjunctivae are normal.  Head: Atraumatic. Ears:  Right TM erythema and dullness with some bulging inferiorly. No bullae. No TM perforation or external auditory canal drainage/redness.    Nose: No congestion/rhinnorhea. Mouth/Throat: Mucous membranes are moist.  Oropharynx non-erythematous. Neck: No stridor.   Cardiovascular: Normal rate, regular rhythm. Good peripheral circulation. Grossly normal heart sounds.   Respiratory: Normal respiratory effort.  No retractions. Lungs CTAB. Gastrointestinal: Soft and nontender. No distention.  Musculoskeletal: No lower extremity tenderness nor edema. No gross deformities of extremities. Neurologic:  Normal speech and language. No gross focal neurologic deficits are appreciated.  Skin:  Skin is warm, dry and intact. No rash noted.  ____________________________________________   PROCEDURES  Procedure(s) performed:   Procedures  None ____________________________________________   INITIAL IMPRESSION / ASSESSMENT AND PLAN / ED COURSE  Pertinent labs & imaging results that were available during my care of the patient were reviewed by me and considered in my medical decision making (see chart for details).  Patient resents emergency department for evaluation of right ear pain and muffled sounds from both ears. No trauma to the ears. No fevers or chills. The patient does have a bulging erythematous right tympanic membrane. This is her second ear infection in the last 6 weeks. Plan to discharge home with Augmentin and Diflucan as she had difficulty with [place] infection during prior antibiotic administration and was unable to complete the course. No evidence of meningismus. No ruptured tympanic membrane. Plan for discharge to see ear nose and throat is follow-up and see the primary care physician.  At this time, I do not feel there is any life-threatening condition present. I have reviewed and discussed all results (EKG, imaging, lab, urine as appropriate), exam findings with patient. I have reviewed nursing notes and appropriate previous records.  I feel the patient is safe to be discharged home without further emergent workup. Discussed  usual and customary return precautions. Patient and family (if present) verbalize understanding and are comfortable with this plan.  Patient will follow-up with their primary care provider. If they do not have a primary care provider, information for follow-up has been provided to them. All questions have been answered.   ____________________________________________  FINAL CLINICAL IMPRESSION(S) / ED DIAGNOSES  Final diagnoses:  Acute otitis media, unspecified otitis media type     MEDICATIONS GIVEN DURING THIS VISIT:  None  NEW OUTPATIENT MEDICATIONS STARTED DURING THIS VISIT:  New Prescriptions   AMOXICILLIN-CLAVULANATE (AUGMENTIN) 875-125 MG TABLET    Take 1 tablet by mouth every 12 (twelve) hours.   FLUCONAZOLE (DIFLUCAN) 200 MG TABLET    Take 1 tablet (200 mg total) by mouth daily.      Note:  This document was prepared using Dragon voice recognition software and may include unintentional dictation errors.  Nanda Quinton, MD Emergency Medicine   Margette Fast, MD 04/23/16 [phone]

## 2016-04-23 NOTE — ED Triage Notes (Signed)
Pt reports 4 days of bilateral ear pain. States "I've had a cold with nasal congestion for the last 6 months."

## 2016-05-17 ENCOUNTER — Emergency Department (HOSPITAL_BASED_OUTPATIENT_CLINIC_OR_DEPARTMENT_OTHER)
Admission: EM | Admit: 2016-05-17 | Discharge: 2016-05-17 | Disposition: A | Discharge status: Home / Self Care | Payer: Self-pay | Attending: Emergency Medicine | Admitting: Emergency Medicine

## 2016-05-17 ENCOUNTER — Encounter (HOSPITAL_BASED_OUTPATIENT_CLINIC_OR_DEPARTMENT_OTHER): Payer: Self-pay | Admitting: *Deleted

## 2016-05-17 DIAGNOSIS — H6983 Other specified disorders of Eustachian tube, bilateral: Secondary | ICD-10-CM

## 2016-05-17 DIAGNOSIS — H6993 Unspecified Eustachian tube disorder, bilateral: Secondary | ICD-10-CM | POA: Insufficient documentation

## 2016-05-17 DIAGNOSIS — Z791 Long term (current) use of non-steroidal anti-inflammatories (NSAID): Secondary | ICD-10-CM | POA: Insufficient documentation

## 2016-05-17 MED ORDER — FLUTICASONE PROPIONATE 50 MCG/ACT NA SUSP: 2.0000 | Freq: Every day | NASAL | 0 refills | Status: DC

## 2016-05-17 MED ORDER — CETIRIZINE HCL 10 MG PO TABS: 10.0000 mg | ORAL_TABLET | Freq: Every day | ORAL | 1 refills | Status: DC

## 2016-05-17 MED ORDER — PREDNISONE 20 MG PO TABS: 40.0000 mg | ORAL_TABLET | Freq: Every day | ORAL | 0 refills | Status: DC

## 2016-05-17 NOTE — ED Notes (Signed)
Pt directed to pharmacy to pick up Rx 

## 2016-05-17 NOTE — ED Triage Notes (Signed)
Patient is alert and oriented x4.  She is complaining of decrease in her hearing for both ears.  She was seen 3 weeks ago for an ear infection and did not get any relief from the antibiotics.  Currently she denies any pain

## 2016-05-17 NOTE — ED Provider Notes (Signed)
[facility] Provider Note   CSN: [id number] Arrival date & time: 05/17/16  1327     History   Chief Complaint Chief Complaint  Patient presents with  . Ear Fullness    HPI Melissa [name] is a 41 y.o. female.  Melissa [name] is a 41 y.o. Female who presents to the emergency department complaining of about a month of ear fullness. Patient reports she feels that her ears are underwater. She reports ear pressure. She denies any ear pain. She reports being seen in the emergency department 3 weeks ago and was diagnosed with an ear infection. She reports her pain is resolved but she has persistent ear fullness. She completed a course of Augmentin. She has taken nothing for treatment of her symptoms today. She reports her hearing is muffled but it is intact. She denies fevers, sore throat, trouble swallowing, ear discharge, loss of hearing, or coughing.   The history is provided by the patient and medical records. No language interpreter was used.  Ear Fullness  Pertinent negatives include no headaches.    Past Medical History:  Diagnosis Date  . Abnormal Pap smear 08/30/2008   LGSIL  . Allergy   . BV (bacterial vaginosis)    recurring  . Chlamydia   . Fibroid   . Infertility    Secondary PCOS  . PCOS (polycystic ovarian syndrome)   . Shingles outbreak 11/2011    Patient Active Problem List   Diagnosis Date Noted  . Right ankle injury 03/01/2016  . Rash and nonspecific skin eruption 08/11/2015  . Contraceptive management 10/22/2013  . Cold 10/22/2013  . Uterine fibroids, antepartum condition or complication 99991111  . Ruptured ectopic pregnancy 07/05/2011  . Obesity 12/21/2010    Past Surgical History:  Procedure Laterality Date  . DILATION AND CURETTAGE OF UTERUS  06/2011   ectopic  . EAR TUBE REMOVAL    . LAPAROSCOPY  07/05/2011   Procedure: LAPAROSCOPY OPERATIVE;  Surgeon: Osborne Oman, MD;  Location: [facility];  Service: Gynecology;  Laterality:  N/A;  . right ear surgery    . WISDOM TOOTH EXTRACTION      OB History    Gravida Para Term Preterm AB Living   5 3 3  0 2 3   SAB TAB Ectopic Multiple Live Births   1 0 1 0 3       Home Medications    Prior to Admission medications   Medication Sig Start Date End Date Taking? Authorizing Provider  amoxicillin-clavulanate (AUGMENTIN) 875-125 MG tablet Take 1 tablet by mouth every 12 (twelve) hours. 04/23/16   Margette Fast, MD  cetirizine (ZYRTEC ALLERGY) 10 MG tablet Take 1 tablet (10 mg total) by mouth daily. 05/17/16   Waynetta Pean, PA-C  fluticasone (FLONASE) 50 MCG/ACT nasal spray Place 2 sprays into both nostrils daily. 05/17/16   Waynetta Pean, PA-C  ibuprofen (ADVIL,MOTRIN) 800 MG tablet Take 1 tablet (800 mg total) by mouth 3 (three) times daily. 03/16/16   Domenic Moras, PA-C  nystatin cream (MYCOSTATIN) Apply 1 application topically 2 (two) times daily. 03/26/16   Osborne Oman, MD  predniSONE (DELTASONE) 20 MG tablet Take 2 tablets (40 mg total) by mouth daily. 05/17/16   Waynetta Pean, PA-C    Family History Family History  Problem Relation Age of Onset  . Diabetes Mother   . Hypertension Mother   . Cancer Father   . Fibroids Sister   . Hypertension Sister   . Crohn's disease Brother   .  Cancer Brother   . Diabetes Brother   . Hypertension Brother   . Kidney disease Brother   . Anesthesia problems Neg Hx     Social History Social History  Substance Use Topics  . Smoking status: Never Smoker  . Smokeless tobacco: Never Used  . Alcohol use No     Allergies   Spinach   Review of Systems Review of Systems  Constitutional: Negative for chills and fever.  HENT: Positive for postnasal drip and rhinorrhea. Negative for congestion, ear discharge, ear pain, facial swelling, hearing loss, sneezing, sore throat and trouble swallowing.        Ear fullness   Eyes: Negative for visual disturbance.  Respiratory: Negative for cough.   Skin: Negative for rash and  wound.  Neurological: Negative for headaches.     Physical Exam Updated Vital Signs BP 115/74 (BP Location: Right Arm)   Pulse 75   Temp 98.5 F (36.9 C) (Oral)   Resp 18   Ht 5\' 6"  (1.676 m)   Wt 101.6 kg   LMP 04/25/2016   SpO2 100%   BMI 36.15 kg/m   Physical Exam  Constitutional: She appears well-developed and well-nourished. No distress.  HENT:  Head: Normocephalic and atraumatic.  Right Ear: External ear normal.  Left Ear: External ear normal.  Mouth/Throat: Oropharynx is clear and moist. No oropharyngeal exudate.  Mild-to-moderate middle ear effusion noted bilaterally. No TM erythema or loss of landmarks. No cerumen impaction. Hearing is grossly intact bilaterally. Boggy nasal turbinates bilaterally.  Eyes: Conjunctivae are normal. Pupils are equal, round, and reactive to light. Right eye exhibits no discharge. Left eye exhibits no discharge.  Neck: Neck supple.  Cardiovascular: Normal rate, regular rhythm and intact distal pulses.   Pulmonary/Chest: Effort normal. No respiratory distress.  Neurological: She is alert. Coordination normal.  Skin: Skin is warm and dry. Capillary refill takes less than 2 seconds. No rash noted. She is not diaphoretic. No erythema. No pallor.  Psychiatric: She has a normal mood and affect. Her behavior is normal.  Nursing note and vitals reviewed.    ED Treatments / Results  Labs (all labs ordered are listed, but only abnormal results are displayed) Labs Reviewed - No data to display  EKG  EKG Interpretation None       Radiology No results found.  Procedures Procedures (including critical care time)  Medications Ordered in ED Medications - No data to display   Initial Impression / Assessment and Plan / ED Course  I have reviewed the triage vital signs and the nursing notes.  Pertinent labs & imaging results that were available during my care of the patient were reviewed by me and considered in my medical decision  making (see chart for details).  Clinical Course    This is a 41 y.o. Female who presents to the emergency department complaining of about a month of ear fullness. Patient reports she feels that her ears are underwater. She reports ear pressure. She denies any ear pain. She reports being seen in the emergency department 3 weeks ago and was diagnosed with an ear infection. She reports her pain is resolved but she has persistent ear fullness. She completed a course of Augmentin. She has taken nothing for treatment of her symptoms today. On examination patient is afebrile nontoxic appearing. She is mild to moderate middle ear effusion noted bilaterally. Patient with eustachian tube dysfunction. No TM erythema or loss of landmarks. Will start on Flonase, Zyrtec and to a short  course of prednisone. I advised her symptoms persist a follow-up with ENT Dr. Redmond Baseman. I advised the patient to follow-up with their primary care provider this week. I advised the patient to return to the emergency department with new or worsening symptoms or new concerns. The patient verbalized understanding and agreement with plan.     Final Clinical Impressions(s) / ED Diagnoses   Final diagnoses:  Dysfunction of both eustachian tubes    New Prescriptions New Prescriptions   CETIRIZINE (ZYRTEC ALLERGY) 10 MG TABLET    Take 1 tablet (10 mg total) by mouth daily.   FLUTICASONE (FLONASE) 50 MCG/ACT NASAL SPRAY    Place 2 sprays into both nostrils daily.   PREDNISONE (DELTASONE) 20 MG TABLET    Take 2 tablets (40 mg total) by mouth daily.     Waynetta Pean, PA-C 05/17/16 [place], MD 05/18/16 1026

## 2016-05-17 NOTE — ED Notes (Signed)
ED Provider at bedside. 

## 2016-05-22 ENCOUNTER — Telehealth: Payer: Self-pay

## 2016-05-22 NOTE — Telephone Encounter (Signed)
Pt called and stated she is wanting to speak with Hansel Feinstein.

## 2016-05-22 NOTE — Telephone Encounter (Signed)
I returned patients call and she stated that she doesn't have anything to discuss with me, she just wants Lelan Pons to call her. I told patient that I would send Lelan Pons a message but wasn't sure if she would be called back today. Patient agreeable to this.

## 2016-07-04 ENCOUNTER — Emergency Department (HOSPITAL_BASED_OUTPATIENT_CLINIC_OR_DEPARTMENT_OTHER)
Admission: EM | Admit: 2016-07-04 | Discharge: 2016-07-04 | Disposition: A | Discharge status: Home / Self Care | Payer: Self-pay | Attending: Emergency Medicine | Admitting: Emergency Medicine

## 2016-07-04 ENCOUNTER — Encounter (HOSPITAL_BASED_OUTPATIENT_CLINIC_OR_DEPARTMENT_OTHER): Payer: Self-pay

## 2016-07-04 DIAGNOSIS — H9191 Unspecified hearing loss, right ear: Secondary | ICD-10-CM | POA: Insufficient documentation

## 2016-07-04 DIAGNOSIS — H9202 Otalgia, left ear: Secondary | ICD-10-CM | POA: Insufficient documentation

## 2016-07-04 DIAGNOSIS — H9201 Otalgia, right ear: Secondary | ICD-10-CM

## 2016-07-04 MED ORDER — CIPROFLOXACIN-DEXAMETHASONE 0.3-0.1 % OT SUSP: 4.0000 [drp] | Freq: Two times a day (BID) | OTIC | 0 refills | Status: DC

## 2016-07-04 MED ORDER — AMOXICILLIN-POT CLAVULANATE 875-125 MG PO TABS: 1.0000 | ORAL_TABLET | Freq: Two times a day (BID) | ORAL | 0 refills | Status: AC

## 2016-07-04 MED ORDER — NEOMYCIN-POLYMYXIN-HC 3.5-10000-1 OT SUSP: 4.0000 [drp] | Freq: Three times a day (TID) | OTIC | 0 refills | Status: AC

## 2016-07-04 NOTE — ED Notes (Signed)
While pt was at pharmacy getting her prescriptions, pt became lightheaded, shaky and diaphoretic.  Took blood sugar.  CBG 65.    BP 140-90, HR 98, Sat 98%.  Pt given 2 cups apple juice.  Cheese and crackers given.  Pt felt better after 30 minutes and was clear to go home.  Pt taken to car via wheelchair, discharged in care of husband.

## 2016-07-04 NOTE — ED Triage Notes (Signed)
C/o pain and decreased hearing to right ear x "months"-denies injury-NAD-steady gait

## 2016-07-04 NOTE — ED Provider Notes (Signed)
[facility] Provider Note   CSN: [id number] Arrival date & time: 07/04/16  1245     History   Chief Complaint Chief Complaint  Patient presents with  . Otalgia    HPI Melissa [name] is a 41 y.o. female.  The history is provided by the patient.  Otalgia  This is a recurrent problem. Episode onset: several days. There is pain in the right ear. The problem occurs constantly. The problem has been gradually worsening. There has been no fever. The pain is severe. Associated symptoms include hearing loss (2 months). Pertinent negatives include no ear discharge, no headaches, no rhinorrhea, no diarrhea, no vomiting and no cough. Her past medical history is significant for chronic ear infection. Past medical history comments: reports h/o ear surgery to remove mass.    Past Medical History:  Diagnosis Date  . Abnormal Pap smear 08/30/2008   LGSIL  . Allergy   . BV (bacterial vaginosis)    recurring  . Chlamydia   . Fibroid   . Infertility    Secondary PCOS  . PCOS (polycystic ovarian syndrome)   . Shingles outbreak 11/2011    Patient Active Problem List   Diagnosis Date Noted  . Right ankle injury 03/01/2016  . Rash and nonspecific skin eruption 08/11/2015  . Contraceptive management 10/22/2013  . Cold 10/22/2013  . Uterine fibroids, antepartum condition or complication 99991111  . Ruptured ectopic pregnancy 07/05/2011  . Obesity 12/21/2010    Past Surgical History:  Procedure Laterality Date  . DILATION AND CURETTAGE OF UTERUS  06/2011   ectopic  . EAR TUBE REMOVAL    . LAPAROSCOPY  07/05/2011   Procedure: LAPAROSCOPY OPERATIVE;  Surgeon: Osborne Oman, MD;  Location: [facility];  Service: Gynecology;  Laterality: N/A;  . right ear surgery    . WISDOM TOOTH EXTRACTION      OB History    Gravida Para Term Preterm AB Living   5 3 3  0 2 3   SAB TAB Ectopic Multiple Live Births   1 0 1 0 3       Home Medications    Prior to Admission medications    Medication Sig Start Date End Date Taking? Authorizing Provider  amoxicillin-clavulanate (AUGMENTIN) 875-125 MG tablet Take 1 tablet by mouth 2 (two) times daily. 07/04/16 07/14/16  Fatima Blank, MD  neomycin-polymyxin-hydrocortisone (CORTISPORIN) 3.5-10000-1 otic suspension Place 4 drops into the right ear 3 (three) times daily. 07/04/16 07/14/16  Fatima Blank, MD    Family History Family History  Problem Relation Age of Onset  . Diabetes Mother   . Hypertension Mother   . Cancer Father   . Fibroids Sister   . Hypertension Sister   . Crohn's disease Brother   . Cancer Brother   . Diabetes Brother   . Hypertension Brother   . Kidney disease Brother   . Anesthesia problems Neg Hx     Social History Social History  Substance Use Topics  . Smoking status: Never Smoker  . Smokeless tobacco: Never Used  . Alcohol use No     Allergies   Spinach   Review of Systems Review of Systems  HENT: Positive for ear pain and hearing loss (2 months). Negative for ear discharge and rhinorrhea.   Respiratory: Negative for cough.   Gastrointestinal: Negative for diarrhea and vomiting.  Neurological: Negative for headaches.     Physical Exam Updated Vital Signs BP 126/63 (BP Location: Left Arm)   Pulse 79  Temp 98.8 F (37.1 C) (Oral)   Resp 16   Ht 5\' 6"  (1.676 m)   Wt 237 lb (107.5 kg)   LMP 06/21/2016   SpO2 99%   BMI 38.25 kg/m   Physical Exam  Constitutional: She is oriented to person, place, and time. She appears well-developed and well-nourished. No distress.  HENT:  Head: Normocephalic and atraumatic.  Right Ear: External ear normal. No mastoid tenderness. Tympanic membrane is injected, scarred and bulging.  Left Ear: Tympanic membrane, external ear and ear canal normal.  Nose: Nose normal.  opaque  Eyes: Conjunctivae and EOM are normal. No scleral icterus.  Neck: Normal range of motion and phonation normal.  Cardiovascular: Normal rate and regular  rhythm.   Pulmonary/Chest: Effort normal. No stridor. No respiratory distress.  Abdominal: She exhibits no distension.  Musculoskeletal: Normal range of motion. She exhibits no edema.  Neurological: She is alert and oriented to person, place, and time.  Skin: She is not diaphoretic.  Psychiatric: She has a normal mood and affect. Her behavior is normal.  Vitals reviewed.    ED Treatments / Results  Labs (all labs ordered are listed, but only abnormal results are displayed) Labs Reviewed - No data to display  EKG  EKG Interpretation None       Radiology No results found.  Procedures Procedures (including critical care time)  Medications Ordered in ED Medications - No data to display   Initial Impression / Assessment and Plan / ED Course  I have reviewed the triage vital signs and the nursing notes.  Pertinent labs & imaging results that were available during my care of the patient were reviewed by me and considered in my medical decision making (see chart for details).     Presentation patient concerning for cholesteatoma. Discussed case with Dr. Janace Hoard of ENT who was able to schedule close follow-up appointment for 2 days from now. He also recommended prescribing her Ciprodex and Augmentin. Due to financial restraints patient was provided with neomycin-polymycin-hydrocortisone. The patient is safe for discharge with strict return precautions.   Final Clinical Impressions(s) / ED Diagnoses   Final diagnoses:  Right ear pain   Disposition: Discharge  Condition: Good  I have discussed the results, Dx and Tx plan with the patient who expressed understanding and agree(s) with the plan. Discharge instructions discussed at great length. The patient was given strict return precautions who verbalized understanding of the instructions. No further questions at time of discharge.    Discharge Medication List as of 07/04/2016  3:16 PM    START taking these medications    Details  amoxicillin-clavulanate (AUGMENTIN) 875-125 MG tablet Take 1 tablet by mouth 2 (two) times daily., Starting Wed 07/04/2016, Until Sat 07/14/2016, Print    ciprofloxacin-dexamethasone (CIPRODEX) otic suspension Place 4 drops into the right ear 2 (two) times daily., Starting Wed 07/04/2016, Until Wed 07/11/2016, Print        Follow Up: Melissa Montane, MD [address] [place] [address] [phone]  On 07/06/2016 at 1240pm for assessment of right ear pain      Fatima Blank, MD 07/04/16 [phone]

## 2016-07-05 LAB — CBG MONITORING, ED: Glucose-Capillary: 65 mg/dL (ref 65–99)

## 2016-07-17 ENCOUNTER — Telehealth: Payer: Self-pay | Admitting: *Deleted

## 2016-07-17 NOTE — Telephone Encounter (Addendum)
Pt left message stating that she received Rx for Augmentin form Dr. Shellee Milo to treat Rt ear infection. She has developed a yeast infection which she always gets from antibiotics and is requesting Rx to be sent to her pharmacy. She is hoping not to have to be seen. I returned pt's call after checking with her pharmacy. I advised her that she has a refill of fluconazole available and I have instructed the pharmacy to prepare it for her. She may pick it up today. Pt expressed gratitude and voiced understanding.

## 2016-08-01 ENCOUNTER — Ambulatory Visit (INDEPENDENT_AMBULATORY_CARE_PROVIDER_SITE_OTHER): Payer: Medicaid Other | Admitting: Obstetrics and Gynecology

## 2016-08-01 ENCOUNTER — Encounter: Payer: Self-pay | Admitting: Obstetrics and Gynecology

## 2016-08-01 ENCOUNTER — Other Ambulatory Visit (HOSPITAL_COMMUNITY)
Admission: RE | Admit: 2016-08-01 | Discharge: 2016-08-01 | Disposition: A | Discharge status: Home / Self Care | Payer: Medicaid Other | Source: Ambulatory Visit | Attending: Obstetrics and Gynecology | Admitting: Obstetrics and Gynecology

## 2016-08-01 VITALS — BP 129/65 | HR 61 | Ht 64.0 in | Wt 240.0 lb

## 2016-08-01 DIAGNOSIS — N898 Other specified noninflammatory disorders of vagina: Secondary | ICD-10-CM

## 2016-08-01 DIAGNOSIS — N760 Acute vaginitis: Secondary | ICD-10-CM | POA: Insufficient documentation

## 2016-08-01 DIAGNOSIS — Z113 Encounter for screening for infections with a predominantly sexual mode of transmission: Secondary | ICD-10-CM | POA: Diagnosis not present

## 2016-08-01 NOTE — Progress Notes (Signed)
41 yo G5 P3023 here for the evaluation of vaginitis which has been present for the past few weeks. Patient denies the presence of a odor or pruritis. She reports a normal period on 07/18/2016. She states that she feels pregnancy despite multiple pregnancy test and feels fetal movement. She reports weight gain in her abdomen only. She denies any pain  Past Medical History:  Diagnosis Date  . Abnormal Pap smear 08/30/2008   LGSIL  . Allergy   . BV (bacterial vaginosis)    recurring  . Chlamydia   . Fibroid   . Infertility    Secondary PCOS  . PCOS (polycystic ovarian syndrome)   . Shingles outbreak 11/2011   Past Surgical History:  Procedure Laterality Date  . DILATION AND CURETTAGE OF UTERUS  06/2011   ectopic  . EAR TUBE REMOVAL    . LAPAROSCOPY  07/05/2011   Procedure: LAPAROSCOPY OPERATIVE;  Surgeon: Osborne Oman, MD;  Location: [facility];  Service: Gynecology;  Laterality: N/A;  . right ear surgery    . WISDOM TOOTH EXTRACTION     Family History  Problem Relation Age of Onset  . Diabetes Mother   . Hypertension Mother   . Cancer Father   . Fibroids Sister   . Hypertension Sister   . Crohn's disease Brother   . Cancer Brother   . Diabetes Brother   . Hypertension Brother   . Kidney disease Brother   . Anesthesia problems Neg Hx    Social History  Substance Use Topics  . Smoking status: Never Smoker  . Smokeless tobacco: Never Used  . Alcohol use No   ROS See pertinent in HPI  Blood pressure 129/65, pulse 61, height 5\' 4"  (1.626 m), weight 240 lb (108.9 kg), last menstrual period 07/18/2016. GENERAL: Well-developed, well-nourished female in no acute distress.  ABDOMEN: Soft, nontender, nondistended. No organomegaly. PELVIC: Normal external female genitalia. Vagina is pink and rugated.  Normal white discharge. Normal appearing cervix. Uterus is normal in size. No adnexal mass or tenderness. EXTREMITIES: No cyanosis, clubbing, or edema, 2+ distal pulses.  A/P 41 yo  with vaginitis and pseudocyesis - wet prep collected - Pt informed that the ultrasound is considered a limited OB ultrasound and is not intended to be a complete ultrasound exam.  Patient also informed that the ultrasound is not being completed with the intent of assessing for fetal or placental anomalies or any pelvic abnormalities.  Explained that the purpose of today's ultrasound is to assess for  pregnancy.  Patient acknowledges the purpose of the exam and the limitations of the study.  A normal uterus was demonstrated with thin endometrial lining - patient will be contacted with results of wet prep - Patient reports being disappointed at the knowledge of not being pregnant. She was hoping to reconcile with her estranged husband - RTC prn

## 2016-08-02 LAB — CERVICOVAGINAL ANCILLARY ONLY
BACTERIAL VAGINITIS: NEGATIVE
CANDIDA VAGINITIS: NEGATIVE
CHLAMYDIA, DNA PROBE: NEGATIVE
NEISSERIA GONORRHEA: NEGATIVE
TRICH (WINDOWPATH): NEGATIVE

## 2016-08-10 DIAGNOSIS — H722X2 Other marginal perforations of tympanic membrane, left ear: Secondary | ICD-10-CM | POA: Diagnosis not present

## 2016-08-10 DIAGNOSIS — H90A31 Mixed conductive and sensorineural hearing loss, unilateral, right ear with restricted hearing on the contralateral side: Secondary | ICD-10-CM | POA: Insufficient documentation

## 2016-08-10 DIAGNOSIS — H6981 Other specified disorders of Eustachian tube, right ear: Secondary | ICD-10-CM | POA: Diagnosis not present

## 2016-08-29 ENCOUNTER — Emergency Department (HOSPITAL_COMMUNITY): Payer: Medicaid Other

## 2016-08-29 ENCOUNTER — Encounter (HOSPITAL_COMMUNITY): Payer: Self-pay | Admitting: Emergency Medicine

## 2016-08-29 ENCOUNTER — Emergency Department (HOSPITAL_COMMUNITY)
Admission: EM | Admit: 2016-08-29 | Discharge: 2016-08-29 | Disposition: A | Discharge status: Home / Self Care | Payer: Medicaid Other | Attending: Emergency Medicine | Admitting: Emergency Medicine

## 2016-08-29 DIAGNOSIS — J069 Acute upper respiratory infection, unspecified: Secondary | ICD-10-CM

## 2016-08-29 DIAGNOSIS — R490 Dysphonia: Secondary | ICD-10-CM

## 2016-08-29 DIAGNOSIS — H748X3 Other specified disorders of middle ear and mastoid, bilateral: Secondary | ICD-10-CM | POA: Insufficient documentation

## 2016-08-29 DIAGNOSIS — H6593 Unspecified nonsuppurative otitis media, bilateral: Secondary | ICD-10-CM

## 2016-08-29 DIAGNOSIS — R05 Cough: Secondary | ICD-10-CM | POA: Diagnosis present

## 2016-08-29 LAB — CBC
HCT: 38.6 % (ref 36.0–46.0)
HEMOGLOBIN: 12.7 g/dL (ref 12.0–15.0)
MCH: 28.6 pg (ref 26.0–34.0)
MCHC: 32.9 g/dL (ref 30.0–36.0)
MCV: 86.9 fL (ref 78.0–100.0)
PLATELETS: 268 10*3/uL (ref 150–400)
RBC: 4.44 MIL/uL (ref 3.87–5.11)
RDW: 14.3 % (ref 11.5–15.5)
WBC: 5.7 10*3/uL (ref 4.0–10.5)

## 2016-08-29 LAB — BASIC METABOLIC PANEL
ANION GAP: 9 (ref 5–15)
BUN: 13 mg/dL (ref 6–20)
CALCIUM: 9.4 mg/dL (ref 8.9–10.3)
CO2: 25 mmol/L (ref 22–32)
CREATININE: 0.79 mg/dL (ref 0.44–1.00)
Chloride: 104 mmol/L (ref 101–111)
Glucose, Bld: 96 mg/dL (ref 65–99)
Potassium: 3.3 mmol/L — ABNORMAL LOW (ref 3.5–5.1)
SODIUM: 138 mmol/L (ref 135–145)

## 2016-08-29 LAB — TROPONIN I

## 2016-08-29 MED ORDER — AZITHROMYCIN 250 MG PO TABS: 250.0000 mg | ORAL_TABLET | Freq: Every day | ORAL | 0 refills | Status: DC

## 2016-08-29 MED ORDER — AZITHROMYCIN 250 MG PO TABS
500.0000 mg | ORAL_TABLET | Freq: Once | ORAL | Status: AC
  Administered 2016-08-29: 500 mg via ORAL
  Filled 2016-08-29: qty 2

## 2016-08-29 MED ORDER — LIDOCAINE VISCOUS 2 % MT SOLN: 15.0000 mL | OROMUCOSAL | 0 refills | Status: DC | PRN

## 2016-08-29 MED ORDER — LIDOCAINE VISCOUS 2 % MT SOLN
15.0000 mL | Freq: Once | OROMUCOSAL | Status: AC
  Administered 2016-08-29: 15 mL via OROMUCOSAL
  Filled 2016-08-29: qty 15

## 2016-08-29 MED ORDER — BENZONATATE 100 MG PO CAPS: 100.0000 mg | ORAL_CAPSULE | Freq: Three times a day (TID) | ORAL | 0 refills | Status: DC

## 2016-08-29 MED ORDER — BENZONATATE 100 MG PO CAPS
100.0000 mg | ORAL_CAPSULE | Freq: Once | ORAL | Status: AC
  Administered 2016-08-29: 100 mg via ORAL
  Filled 2016-08-29: qty 1

## 2016-08-29 NOTE — ED Notes (Signed)
Pt ambulatory to room 13 from lobby; steady gait noted

## 2016-08-29 NOTE — Discharge Instructions (Signed)
Take the prescribed medication as directed.  May wish to try a decongestant such as mucinex as well. Follow-up with your primary care doctor. Return to the ED for new or worsening symptoms.

## 2016-08-29 NOTE — ED Notes (Signed)
Pt given warm water, chicken bullion, and crackers

## 2016-08-29 NOTE — ED Triage Notes (Signed)
Patient reports central chest pain with productive cough , chest congestion and headache onset yesterday , pt. Added that she applied bleach at her shingles at back of right ear . Respirations unlabored / no emesis or diaphoresis .

## 2016-08-29 NOTE — ED Notes (Signed)
Pt given ice chips

## 2016-08-29 NOTE — ED Provider Notes (Signed)
[facility] DEPT Provider Note   CSN: [id number] Arrival date & time: 08/29/16  0133     History   Chief Complaint Chief Complaint  Patient presents with  . Chest Pain  . Headache  . Cough    HPI Melissa [name] is a 41 y.o. female.  The history is provided by the patient and medical records.  Chest Pain   Associated symptoms include cough and headaches.  Headache    Cough  Associated symptoms include chest pain, ear pain, headaches and rhinorrhea.    41 y.o. M with hx of recurrent BV, uterine fibroids, hx of shingles, presenting to the ED for chest pain, cough, sore throat, ear pain, and headache.  States she has been having issues with ear pain intermittently for the past few weeks.  States she was seen by ENT and told to use nasal spray and "blow her nose a lot" but states this is not helping.  States over the past 2 days she has had dry cough, nasal congestion with PND, sore throat, loss of voice, and headache.  States when she coughs it makes her chest hurt and it feels sore.  States cough also makes her throat feel "raw".  She denies any shortness of breath.  Reports fever yesterday.  No sick contacts.  No trouble swallowing.  Has not tried any OTC medications for cough/cold.  Past Medical History:  Diagnosis Date  . Abnormal Pap smear 08/30/2008   LGSIL  . Allergy   . BV (bacterial vaginosis)    recurring  . Chlamydia   . Fibroid   . Infertility    Secondary PCOS  . PCOS (polycystic ovarian syndrome)   . Shingles outbreak 11/2011    Patient Active Problem List   Diagnosis Date Noted  . Right ankle injury 03/01/2016  . Rash and nonspecific skin eruption 08/11/2015  . Contraceptive management 10/22/2013  . Cold 10/22/2013  . Uterine fibroids, antepartum condition or complication 27/07/5007  . Ruptured ectopic pregnancy 07/05/2011  . Obesity 12/21/2010    Past Surgical History:  Procedure Laterality Date  . DILATION AND CURETTAGE OF UTERUS  06/2011   ectopic  . EAR TUBE REMOVAL    . LAPAROSCOPY  07/05/2011   Procedure: LAPAROSCOPY OPERATIVE;  Surgeon: Osborne Oman, MD;  Location: [facility];  Service: Gynecology;  Laterality: N/A;  . right ear surgery    . WISDOM TOOTH EXTRACTION      OB History    Gravida Para Term Preterm AB Living   5 3 3  0 2 3   SAB TAB Ectopic Multiple Live Births   1 0 1 0 3       Home Medications    Prior to Admission medications   Medication Sig Start Date End Date Taking? Authorizing Provider  acetaminophen (TYLENOL) 500 MG tablet Take 1,000 mg by mouth every 6 (six) hours as needed for mild pain.   Yes Historical Provider, MD  fluticasone (FLONASE) 50 MCG/ACT nasal spray Place 1-2 sprays into both nostrils daily.   Yes Historical Provider, MD  loratadine (CLARITIN) 10 MG tablet Take 10 mg by mouth daily.   Yes Historical Provider, MD  Multiple Vitamins-Minerals (MULTIVITAMIN WITH MINERALS) tablet Take 1 tablet by mouth daily.   Yes Historical Provider, MD    Family History Family History  Problem Relation Age of Onset  . Diabetes Mother   . Hypertension Mother   . Cancer Father   . Fibroids Sister   . Hypertension Sister   .  Crohn's disease Brother   . Cancer Brother   . Diabetes Brother   . Hypertension Brother   . Kidney disease Brother   . Anesthesia problems Neg Hx     Social History Social History  Substance Use Topics  . Smoking status: Never Smoker  . Smokeless tobacco: Never Used  . Alcohol use No     Allergies   Spinach   Review of Systems Review of Systems  HENT: Positive for congestion, ear pain, postnasal drip, rhinorrhea and voice change.   Respiratory: Positive for cough.   Cardiovascular: Positive for chest pain.  Neurological: Positive for headaches.  All other systems reviewed and are negative.    Physical Exam Updated Vital Signs BP 132/73 (BP Location: Right Arm)   Pulse 91   Temp 98.1 F (36.7 C) (Oral)   Resp 17   Ht 5\' 5"  (1.651 m)   Wt 107.5  kg   SpO2 100%   BMI 39.44 kg/m   Physical Exam  Constitutional: She is oriented to person, place, and time. She appears well-developed and well-nourished.  HENT:  Head: Normocephalic and atraumatic.  Right Ear: Ear canal normal. A middle ear effusion is present.  Left Ear: Ear canal normal. A middle ear effusion is present.  Nose: Mucosal edema and rhinorrhea present.  Mouth/Throat: Oropharynx is clear and moist.  + nasal congestion with PND, some oropharyngeal irritation noted but tonsils overall normal in appearance bilaterally without exudate; uvula midline without evidence of peritonsillar abscess; handling secretions appropriately; no difficulty swallowing or speaking; voice is hoarse, no stridor   Eyes: Conjunctivae and EOM are normal. Pupils are equal, round, and reactive to light.  Neck: Normal range of motion.  Cardiovascular: Normal rate, regular rhythm and normal heart sounds.   Pulmonary/Chest: Effort normal and breath sounds normal. She has no wheezes. She has no rhonchi.  Abdominal: Soft. Bowel sounds are normal.  Musculoskeletal: Normal range of motion.  Neurological: She is alert and oriented to person, place, and time.  Skin: Skin is warm and dry.  Psychiatric: She has a normal mood and affect.  Nursing note and vitals reviewed.    ED Treatments / Results  Labs (all labs ordered are listed, but only abnormal results are displayed) Labs Reviewed  BASIC METABOLIC PANEL - Abnormal; Notable for the following:       Result Value   Potassium 3.3 (*)    All other components within normal limits  CBC  TROPONIN I    EKG  EKG Interpretation None       Radiology Dg Chest 2 View  Result Date: 08/29/2016 CLINICAL DATA:  Initial evaluation for acute chest pain with productive cough, chest congestion. EXAM: CHEST  2 VIEW COMPARISON:  Prior radiograph from 07/13/2010. FINDINGS: The cardiac and mediastinal silhouettes are stable in size and contour, and remain within  normal limits. The lungs are normally inflated. No airspace consolidation, pleural effusion, or pulmonary edema is identified. No significant peribronchial thickening appreciated to suggest bronchiolitis. There is no pneumothorax. No acute osseous abnormality identified. IMPRESSION: No radiographic evidence for active cardiopulmonary disease. Electronically Signed   By: Jeannine Boga M.D.   On: 08/29/2016 02:23    Procedures Procedures (including critical care time)  Medications Ordered in ED Medications  azithromycin (ZITHROMAX) tablet 500 mg (500 mg Oral Given 08/29/16 0425)  lidocaine (XYLOCAINE) 2 % viscous mouth solution 15 mL (15 mLs Mouth/Throat Given 08/29/16 0425)  benzonatate (TESSALON) capsule 100 mg (100 mg Oral Given 08/29/16 0425)  Initial Impression / Assessment and Plan / ED Course  I have reviewed the triage vital signs and the nursing notes.  Pertinent labs & imaging results that were available during my care of the patient were reviewed by me and considered in my medical decision making (see chart for details).  41 year old female here with chest pain, cough, headache, nasal congestion, sore throat, by aches. She is afebrile and nontoxic in appearance here. Vital signs are stable.  Exam reveals some nasal congestion with postnasal drip, oropharyngeal irritation, and middle ear effusions. Tonsils are not edematous and there are no exudates suggestive of strep throat. Screening lab work obtained is reassuring. Chest x-ray is clear.  EKG is nonischemic. Patient likely with URI. Do not suspect ACS, PE, dissection, or other acute cardiac event. Will start azithromycin, tessalon, viscous lidocaine.  May start a decongestant such as Mucinex. Continue oral fluids, rest. Will have her follow-up closely with her primary care doctor.  Work note provided.  Discussed plan with patient, she acknowledged understanding and agreed with plan of care.  Return precautions given for new or  worsening symptoms.  Final Clinical Impressions(s) / ED Diagnoses   Final diagnoses:  Upper respiratory tract infection, unspecified type  Middle ear effusion, bilateral  Hoarse voice quality    New Prescriptions Discharge Medication List as of 08/29/2016  4:57 AM    START taking these medications   Details  azithromycin (ZITHROMAX) 250 MG tablet Take 1 tablet (250 mg total) by mouth daily. Take first 2 tablets together, then 1 every day until finished., Starting Wed 08/29/2016, Print    benzonatate (TESSALON) 100 MG capsule Take 1 capsule (100 mg total) by mouth every 8 (eight) hours., Starting Wed 08/29/2016, Print    lidocaine (XYLOCAINE) 2 % solution Use as directed 15 mLs in the mouth or throat as needed for mouth pain., Starting Wed 08/29/2016, Print         Larene Pickett, PA-C 08/29/16 [place], MD 08/30/16 1420

## 2016-10-30 ENCOUNTER — Encounter (HOSPITAL_BASED_OUTPATIENT_CLINIC_OR_DEPARTMENT_OTHER): Payer: Self-pay | Admitting: Emergency Medicine

## 2016-10-30 ENCOUNTER — Emergency Department (HOSPITAL_BASED_OUTPATIENT_CLINIC_OR_DEPARTMENT_OTHER)
Admission: EM | Admit: 2016-10-30 | Discharge: 2016-10-30 | Disposition: A | Discharge status: Home / Self Care | Payer: Medicaid Other | Attending: Physician Assistant | Admitting: Physician Assistant

## 2016-10-30 ENCOUNTER — Emergency Department (HOSPITAL_BASED_OUTPATIENT_CLINIC_OR_DEPARTMENT_OTHER): Payer: Medicaid Other

## 2016-10-30 DIAGNOSIS — H7090 Unspecified mastoiditis, unspecified ear: Secondary | ICD-10-CM

## 2016-10-30 DIAGNOSIS — Z79899 Other long term (current) drug therapy: Secondary | ICD-10-CM | POA: Diagnosis not present

## 2016-10-30 DIAGNOSIS — H6521 Chronic serous otitis media, right ear: Secondary | ICD-10-CM | POA: Diagnosis not present

## 2016-10-30 DIAGNOSIS — H9201 Otalgia, right ear: Secondary | ICD-10-CM | POA: Diagnosis present

## 2016-10-30 MED ORDER — IBUPROFEN 400 MG PO TABS
600.0000 mg | ORAL_TABLET | Freq: Once | ORAL | Status: AC
  Administered 2016-10-30: 600 mg via ORAL
  Filled 2016-10-30: qty 1

## 2016-10-30 MED ORDER — AMOXICILLIN-POT CLAVULANATE 875-125 MG PO TABS
1.0000 | ORAL_TABLET | Freq: Once | ORAL | Status: AC
  Administered 2016-10-30: 1 via ORAL
  Filled 2016-10-30: qty 1

## 2016-10-30 MED ORDER — AMOXICILLIN-POT CLAVULANATE 875-125 MG PO TABS: 1.0000 | ORAL_TABLET | Freq: Two times a day (BID) | ORAL | 0 refills | Status: AC

## 2016-10-30 NOTE — ED Provider Notes (Signed)
[facility] DEPT [facility] Provider Note   CSN: [id number] Arrival date & time: 10/30/16  1942  By signing my name below, I, Melissa [name], attest that this documentation has been prepared under the direction and in the presence of [facility] PA-C.  Electronically Signed: Ephriam [name], ED Scribe. 10/30/16. 9:12 PM.  History   Chief Complaint Chief Complaint  Patient presents with  . Otalgia    HPI HPI Comments: Melissa [name] is a 41 y.o. female who presents to the Emergency Department complaining of gradual onset, intermittent, 9/10 right ear pain that started this morning. Pt's right ear pain started this morning before she went to work. The pain has worsened throughout the day today. She also reports that she has felt "wobbly" today, which she has had in the past associated with her ear pain. Today she also had a sensation of pressure and fullness in her right ear, and then had a ringing in her right ear followed by hearing loss, which resolved without any treatment. She has applied a heating pad at home with no significant relief. She has applied a smaller heating pad here in the ED with mild relief of her symptoms  Pt also notes associated nasal congestion and has taken Benadryl and Claritin with no significant relief. Pt has been seen here three times in the past 6 months for similar ear pain on the right and left side. She states that this episode feels worse than previous, and that pain is sharp and stabbing. Pt was referred to an ENT after a previous visit in march. She saw the ENT but she did not want to comply with their instructions. 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 She denies any significant hearing new loss today.  Pt does not have a follow up scheduled with her ENT. No fever, chills, sore throat. She denies any dizziness, lightheadedness, cough, chest pain, shortness of breath, double vision or blurry vision.    The history is provided by the patient. No language interpreter was used.    Past Medical History:  Diagnosis Date  . Abnormal Pap smear 08/30/2008   LGSIL  . Allergy   . BV (bacterial vaginosis)    recurring  . Chlamydia   . Fibroid   . Infertility    Secondary PCOS  . PCOS (polycystic ovarian syndrome)   . Shingles outbreak 11/2011    Patient Active Problem List   Diagnosis Date Noted  . Right ankle injury 03/01/2016  . Rash and nonspecific skin eruption 08/11/2015  . Contraceptive management 10/22/2013  . Cold 10/22/2013  . Uterine fibroids, antepartum condition or complication 02/54/2706  . Ruptured ectopic pregnancy 07/05/2011  . Obesity 12/21/2010    Past Surgical History:  Procedure Laterality Date  . DILATION AND CURETTAGE OF UTERUS  06/2011   ectopic  . EAR TUBE REMOVAL    . LAPAROSCOPY  07/05/2011   Procedure: LAPAROSCOPY OPERATIVE;  Surgeon: Osborne Oman, MD;  Location: [facility];  Service: Gynecology;  Laterality: N/A;  . right ear surgery    . WISDOM TOOTH EXTRACTION      OB History    Gravida Para Term Preterm AB Living   5 3 3  0 2 3   SAB TAB Ectopic Multiple Live Births   1 0 1 0 3       Home Medications    Prior to Admission medications   Medication Sig Start Date  End Date Taking? Authorizing Provider  acetaminophen (TYLENOL) 500 MG tablet Take 1,000 mg by mouth every 6 (six) hours as needed for mild pain.    [provider]  amoxicillin-clavulanate (AUGMENTIN) 875-125 MG tablet Take 1 tablet by mouth 2 (two) times daily. 10/30/16 11/06/16  Rodell Perna A, PA-C  azithromycin (ZITHROMAX) 250 MG tablet Take 1 tablet (250 mg total) by mouth daily. Take first 2 tablets together, then 1 every day until finished. 08/29/16   Larene Pickett, PA-C  benzonatate (TESSALON) 100 MG capsule Take 1 capsule (100 mg total) by mouth every 8 (eight) hours. 08/29/16   Larene Pickett, PA-C  fluticasone (FLONASE) 50 MCG/ACT nasal spray Place 1-2 sprays  into both nostrils daily.    [provider]  lidocaine (XYLOCAINE) 2 % solution Use as directed 15 mLs in the mouth or throat as needed for mouth pain. 08/29/16   Larene Pickett, PA-C  loratadine (CLARITIN) 10 MG tablet Take 10 mg by mouth daily.    [provider]  Multiple Vitamins-Minerals (MULTIVITAMIN WITH MINERALS) tablet Take 1 tablet by mouth daily.    [provider]    Family History Family History  Problem Relation Age of Onset  . Diabetes Mother   . Hypertension Mother   . Cancer Father   . Fibroids Sister   . Hypertension Sister   . Crohn's disease Brother   . Cancer Brother   . Diabetes Brother   . Hypertension Brother   . Kidney disease Brother   . Anesthesia problems Neg Hx     Social History Social History  Substance Use Topics  . Smoking status: Never Smoker  . Smokeless tobacco: Never Used  . Alcohol use No     Allergies   Spinach   Review of Systems Review of Systems  Constitutional: Negative for chills and fever.  HENT: Positive for congestion, ear pain (right) and hearing loss (on the right side (resolved without treatment)).   Eyes: Negative for visual disturbance.  Respiratory: Negative for chest tightness and shortness of breath.   Cardiovascular: Negative for chest pain.  Neurological: Negative for dizziness, weakness, light-headedness and headaches.  All other systems reviewed and are negative.    Physical Exam Updated Vital Signs BP 119/85 (BP Location: Right Arm)   Pulse 72   Temp 97.5 F (36.4 C) (Oral)   Resp 16   Ht 5\' 5"  (1.651 m)   Wt 107.5 kg (237 lb)   LMP 10/30/2016 Comment: ncp per pt, lead shielding provided//a.c.  SpO2 100%   BMI 39.44 kg/m   Physical Exam  Constitutional: She is oriented to person, place, and time. She appears well-developed and well-nourished. No distress.  HENT:  Head: Normocephalic and atraumatic.  Left TM with small perforation in the right lower region. No  erythema or bulging. Right TM with scarring, mild erythema, no bulging. Appears to be a yellow-White mass posterior to the TM. There is pain on palpation of the tragus, with pulling of the pinna, and palpation of the mastoid posteriorly. No erythema, fluctuance, or swelling of the mastoid. No drainage coming from the ear canal. No tenderness palpation of the frontal or maxillary sinuses, nasal septum is midline with pink mucosa. Posterior oropharynx is normal without erythema, tonsillar exudate, hypertrophy, or uvular deviation  Eyes: Conjunctivae and EOM are normal. Pupils are equal, round, and reactive to light. Right eye exhibits no discharge. Left eye exhibits no discharge.  Neck: Normal range of motion. No JVD present.  No tracheal deviation present.  Cardiovascular: Normal rate.   Pulmonary/Chest: Effort normal.  Abdominal: She exhibits no distension.  Musculoskeletal: She exhibits no edema.  Lymphadenopathy:    She has no cervical adenopathy.  Neurological: She is alert and oriented to person, place, and time.  Fluent speech, no facial droop, sensation intact globally, normal gait, no nystagmus, negative Romberg, no pronator drift   Skin: Skin is warm and dry. She is not diaphoretic.  Psychiatric: She has a normal mood and affect. Judgment normal.  Nursing note and vitals reviewed.    ED Treatments / Results  DIAGNOSTIC STUDIES: Oxygen Saturation is 99% on RA, normal by my interpretation.  COORDINATION OF CARE: 9:11 PM-Discussed treatment plan with pt at bedside and pt agreed to plan.   Labs (all labs ordered are listed, but only abnormal results are displayed) Labs Reviewed - No data to display  EKG  EKG Interpretation None       Radiology Ct Temporal Bones Wo Contrast  Result Date: 10/30/2016 CLINICAL DATA:  Chronic pressure, fullness and tinnitus RIGHT ear radiating to RIGHT body. Dizziness today. History of tympanostomy tubes. Assess mastoiditis. History of RIGHT ear  tumor. EXAM: CT TEMPORAL BONES WITHOUT CONTRAST TECHNIQUE: Axial and coronal plane CT imaging of the petrous temporal bones was performed with thin-collimation image reconstruction. No intravenous contrast was administered. Multiplanar CT image reconstructions were also generated. COMPARISON:  Temporal bone CT November 03, 2009 FINDINGS: RIGHT: External auditory canal is well formed and well aerated. Chronically thickened retracted RIGHT tympanic membrane. The scutum is sharp. Soft tissue thickening middle ear including Prussak's space. Ossicles are well formed and located. Patent aditus ad antrum. Minimal RIGHT maxillary air cell mucosal thickening without coalescence. Intact tegmen tympani. Intact otic capsule with normal appearance of the inner ear structures. No internal auditory canal expansion. No definite cerebellar pontine angle masses. LEFT: External auditory canal is well formed and aerated. Tympanic membrane is not thickened or retracted. The scutum is sharp. Well aerated middle ear including Prussak's space. Ossicles are well formed and located. Patent aditus ad antrum. Well aerated mastoid air cells without coalescence. Intact tegmen tympani. Intact otic capsule with normal appearance of the inner ear structures. No internal auditory canal expansion. No definite cerebellar pontine angle masses. IMPRESSION: RIGHT: Findings of chronic otitis media with retracted tympanic membrane. No ossicular erosions. Trace RIGHT mastoid effusion without mastoiditis. LEFT: Negative. Electronically Signed   By: Elon Alas M.D.   On: 10/30/2016 22:43    Procedures Procedures (including critical care time)  Medications Ordered in ED Medications  ibuprofen (ADVIL,MOTRIN) tablet 600 mg (600 mg Oral Given 10/30/16 2312)  amoxicillin-clavulanate (AUGMENTIN) 875-125 MG per tablet 1 tablet (1 tablet Oral Given 10/30/16 2312)     Initial Impression / Assessment and Plan / ED Course  I have reviewed the triage vital  signs and the nursing notes.  Pertinent labs & imaging results that were available during my care of the patient were reviewed by me and considered in my medical decision making (see chart for details).     Patient with chronic right ear pain and hearing loss presents to the ED for the same. Afebrile, vital signs are stable. She has been evaluated by ENT, but has not followed up since her initial meeting in March. 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 Concern for mastoiditis with tenderness to palpation of the mastoid, CT temporal bones shows chronic right otitis media with retracted TMs and trace right mastoid effusion without mastoiditis. Will treat for chronic otitis media with Augmentin. Recommend follow-up with ENT for reevaluation and further management of her chronic ear issues. Discussed indications for return to the ED. Pt verbalized understanding of and agreement with plan and is safe for discharge home at this time.  Final Clinical Impressions(s) / ED Diagnoses   Final diagnoses:  Right chronic serous otitis media    New Prescriptions Discharge Medication List as of 10/30/2016 11:11 PM    START taking these medications   Details  amoxicillin-clavulanate (AUGMENTIN) 875-125 MG tablet Take 1 tablet by mouth 2 (two) times daily., Starting Tue 10/30/2016, Until Tue 11/06/2016, Print      I personally performed the services described in this documentation, which was scribed in my presence. The recorded information has been reviewed and is accurate.     Renita Papa, PA-C 10/31/16 0048    Macarthur Critchley, MD 10/31/16 [phone]

## 2016-10-30 NOTE — ED Triage Notes (Signed)
pateint states that she has chronic ear pain to her right ear. Today it is right ear, "and Im off balance"

## 2016-10-30 NOTE — Discharge Instructions (Signed)
Please take all of your antibiotics until finished!   You may develop abdominal discomfort or diarrhea from the antibiotic.  You may help offset this with probiotics which you can buy or get in yogurt. Do not eat  or take the probiotics until 2 hours after your antibiotic.   You may alternate 600 mg of ibuprofen and 500 mg of Tylenol every 3 hours for pain for the next few days. Follow-up with ENT for reevaluation of your chronic ear issues. Return to the ED if any concerning signs or symptoms develop.

## 2016-10-30 NOTE — ED Notes (Signed)
Right ear and behind her ear hurts=stabbing, throbbing pain that radiates to her right side of her body.  She stated that had three surgeries when she was young.  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  She can hear clearer on her left ear than the right ear.

## 2017-01-21 ENCOUNTER — Telehealth: Payer: Self-pay | Admitting: Family Medicine

## 2017-01-21 NOTE — Telephone Encounter (Signed)
Wanted to know if she could get a Rx refill for Diflucan. Patient is requesting a call ASAP.

## 2017-01-28 ENCOUNTER — Encounter: Payer: Self-pay | Admitting: General Practice

## 2017-01-30 ENCOUNTER — Emergency Department (HOSPITAL_BASED_OUTPATIENT_CLINIC_OR_DEPARTMENT_OTHER): Payer: Medicaid Other

## 2017-01-30 ENCOUNTER — Emergency Department (HOSPITAL_BASED_OUTPATIENT_CLINIC_OR_DEPARTMENT_OTHER)
Admission: EM | Admit: 2017-01-30 | Discharge: 2017-01-30 | Disposition: A | Discharge status: Home / Self Care | Payer: Medicaid Other | Attending: Emergency Medicine | Admitting: Emergency Medicine

## 2017-01-30 ENCOUNTER — Encounter (HOSPITAL_BASED_OUTPATIENT_CLINIC_OR_DEPARTMENT_OTHER): Payer: Self-pay | Admitting: Emergency Medicine

## 2017-01-30 DIAGNOSIS — R109 Unspecified abdominal pain: Secondary | ICD-10-CM | POA: Insufficient documentation

## 2017-01-30 LAB — CBC WITH DIFFERENTIAL/PLATELET
BASOS ABS: 0 10*3/uL (ref 0.0–0.1)
BASOS PCT: 0 %
EOS ABS: 0.1 10*3/uL (ref 0.0–0.7)
Eosinophils Relative: 4 %
HEMATOCRIT: 38.5 % (ref 36.0–46.0)
HEMOGLOBIN: 12.8 g/dL (ref 12.0–15.0)
Lymphocytes Relative: 30 %
Lymphs Abs: 1 10*3/uL (ref 0.7–4.0)
MCH: 29 pg (ref 26.0–34.0)
MCHC: 33.2 g/dL (ref 30.0–36.0)
MCV: 87.1 fL (ref 78.0–100.0)
Monocytes Absolute: 0.5 10*3/uL (ref 0.1–1.0)
Monocytes Relative: 15 %
NEUTROS ABS: 1.6 10*3/uL — AB (ref 1.7–7.7)
Neutrophils Relative %: 51 %
Platelets: 257 10*3/uL (ref 150–400)
RBC: 4.42 MIL/uL (ref 3.87–5.11)
RDW: 14.7 % (ref 11.5–15.5)
WBC: 3.2 10*3/uL — AB (ref 4.0–10.5)

## 2017-01-30 LAB — URINALYSIS, ROUTINE W REFLEX MICROSCOPIC
Bilirubin Urine: NEGATIVE
Glucose, UA: NEGATIVE mg/dL
HGB URINE DIPSTICK: NEGATIVE
Ketones, ur: NEGATIVE mg/dL
LEUKOCYTES UA: NEGATIVE
NITRITE: NEGATIVE
PROTEIN: NEGATIVE mg/dL
Specific Gravity, Urine: 1.025 (ref 1.005–1.030)
pH: 5.5 (ref 5.0–8.0)

## 2017-01-30 LAB — COMPREHENSIVE METABOLIC PANEL
ALBUMIN: 3.6 g/dL (ref 3.5–5.0)
ALK PHOS: 42 U/L (ref 38–126)
ALT: 12 U/L — ABNORMAL LOW (ref 14–54)
AST: 18 U/L (ref 15–41)
Anion gap: 5 (ref 5–15)
BILIRUBIN TOTAL: 0.7 mg/dL (ref 0.3–1.2)
BUN: 15 mg/dL (ref 6–20)
CALCIUM: 9.2 mg/dL (ref 8.9–10.3)
CO2: 27 mmol/L (ref 22–32)
Chloride: 106 mmol/L (ref 101–111)
Creatinine, Ser: 0.74 mg/dL (ref 0.44–1.00)
GFR calc non Af Amer: >60 mL/min (ref >60)
Glucose, Bld: 91 mg/dL (ref 65–99)
POTASSIUM: 3.9 mmol/L (ref 3.5–5.1)
Sodium: 138 mmol/L (ref 135–145)
TOTAL PROTEIN: 7.2 g/dL (ref 6.5–8.1)

## 2017-01-30 LAB — LIPASE, BLOOD: LIPASE: 30 U/L (ref 11–51)

## 2017-01-30 LAB — PREGNANCY, URINE: PREG TEST UR: NEGATIVE

## 2017-01-30 MED ORDER — CYCLOBENZAPRINE HCL 10 MG PO TABS: 10.0000 mg | ORAL_TABLET | Freq: Two times a day (BID) | ORAL | 0 refills | Status: DC | PRN

## 2017-01-30 MED ORDER — IBUPROFEN 800 MG PO TABS: 800.0000 mg | ORAL_TABLET | Freq: Three times a day (TID) | ORAL | 0 refills | Status: DC | PRN

## 2017-01-30 NOTE — ED Provider Notes (Signed)
Emergency Department Provider Note   I have reviewed the triage vital signs and the nursing notes.   HISTORY  Chief Complaint Flank Pain   HPI Melissa [name] is a 41 y.o. female presents to the emergency department for evaluation of right flank pain worsening over the last 2 days. She describes occasional mild discomfort on that side for the last several months but over the past 2 days become much more severe. No vaginal bleeding or discharge. No dysuria, hesitancy, or urgency. No hematuria. No fever or chills. Symptoms worse with movement. No radiation. No SOB or CP.   Past Medical History:  Diagnosis Date  . Abnormal Pap smear 08/30/2008   LGSIL  . Allergy   . BV (bacterial vaginosis)    recurring  . Chlamydia   . Fibroid   . Infertility    Secondary PCOS  . PCOS (polycystic ovarian syndrome)   . Shingles outbreak 11/2011    Patient Active Problem List   Diagnosis Date Noted  . Right ankle injury 03/01/2016  . Rash and nonspecific skin eruption 08/11/2015  . Contraceptive management 10/22/2013  . Cold 10/22/2013  . Uterine fibroids, antepartum condition or complication 62/13/0865  . Ruptured ectopic pregnancy 07/05/2011  . Obesity 12/21/2010    Past Surgical History:  Procedure Laterality Date  . DILATION AND CURETTAGE OF UTERUS  06/2011   ectopic  . EAR TUBE REMOVAL    . LAPAROSCOPY  07/05/2011   Procedure: LAPAROSCOPY OPERATIVE;  Surgeon: Osborne Oman, MD;  Location: [facility];  Service: Gynecology;  Laterality: N/A;  . right ear surgery    . WISDOM TOOTH EXTRACTION      Current Outpatient Rx  . Order #: [id number] Class: Historical Med  . Order #: [id number] Class: Print  . Order #: [id number] Class: Print  . Order #: [id number] Class: Print  . Order #: [id number] Class: Historical Med  . Order #: [id number] Class: Print  . Order #: [id number] Class: Print  . Order #: [id number] Class: Historical Med  . Order #: [id number] Class: Historical Med     Allergies Spinach  Family History  Problem Relation Age of Onset  . Diabetes Mother   . Hypertension Mother   . Cancer Father   . Fibroids Sister   . Hypertension Sister   . Crohn's disease Brother   . Cancer Brother   . Diabetes Brother   . Hypertension Brother   . Kidney disease Brother   . Anesthesia problems Neg Hx     Social History Social History  Substance Use Topics  . Smoking status: Never Smoker  . Smokeless tobacco: Never Used  . Alcohol use No    Review of Systems  Constitutional: No fever/chills Eyes: No visual changes. ENT: No sore throat. Cardiovascular: Denies chest pain. Respiratory: Denies shortness of breath. Gastrointestinal: No abdominal pain.  No nausea, no vomiting.  No diarrhea.  No constipation. Genitourinary: Negative for dysuria. Positive right flank pain.  Musculoskeletal: Negative for back pain. Skin: Negative for rash. Neurological: Negative for headaches, focal weakness or numbness.  10-point ROS otherwise negative.  ____________________________________________   PHYSICAL EXAM:  VITAL SIGNS: ED Triage Vitals  Enc Vitals Group     BP 01/30/17 1011 126/76     Pulse Rate 01/30/17 1011 (!) 56     Resp 01/30/17 1011 16     Temp 01/30/17 1011 98.1 F (36.7 C)     Temp src --      SpO2 01/30/17 1011 100 %     Weight 01/30/17 1012 231  lb (104.8 kg)     Height 01/30/17 1012 5\' 5"  (1.651 m)     Pain Score 01/30/17 1019 8   Constitutional: Alert and oriented. Well appearing and in no acute distress. Eyes: Conjunctivae are normal.  Head: Atraumatic. Nose: No congestion/rhinnorhea. Mouth/Throat: Mucous membranes are moist. Neck: No stridor.  Cardiovascular: Normal rate, regular rhythm. Good peripheral circulation. Grossly normal heart sounds.   Respiratory: Normal respiratory effort.  No retractions. Lungs CTAB. Gastrointestinal: Soft and nontender. No CVA tenderness. No distention.  Musculoskeletal: No lower extremity  tenderness nor edema. No gross deformities of extremities. Neurologic:  Normal speech and language. No gross focal neurologic deficits are appreciated.  Skin:  Skin is warm, dry and intact. No rash noted.  ____________________________________________   LABS (all labs ordered are listed, but only abnormal results are displayed)  Labs Reviewed  COMPREHENSIVE METABOLIC PANEL - Abnormal; Notable for the following:       Result Value   ALT 12 (*)    All other components within normal limits  CBC WITH DIFFERENTIAL/PLATELET - Abnormal; Notable for the following:    WBC 3.2 (*)    Neutro Abs 1.6 (*)    All other components within normal limits  LIPASE, BLOOD  PREGNANCY, URINE  URINALYSIS, ROUTINE W REFLEX MICROSCOPIC   ____________________________________________  RADIOLOGY  Ct Renal Stone Study  Result Date: 01/30/2017 CLINICAL DATA:  Right flank pain and hematuria for the past 3-4 days. EXAM: CT ABDOMEN AND PELVIS WITHOUT CONTRAST TECHNIQUE: Multidetector CT imaging of the abdomen and pelvis was performed following the standard protocol without IV contrast. COMPARISON:  None. FINDINGS: Lower chest: The lung bases are clear of acute process. No pleural effusion or pulmonary lesions. The heart is normal in size. No pericardial effusion. The distal esophagus and aorta are unremarkable. Hepatobiliary: No focal hepatic lesions or intrahepatic biliary dilatation. Gallbladder is normal. No common bile duct dilatation. Pancreas: No mass, inflammation or duct dilatation. Spleen: Normal size.  No focal lesions. Adrenals/Urinary Tract: Adrenal glands are unremarkable. No renal, ureteral or bladder calculi or mass. Stomach/Bowel: The stomach, duodenum, small bowel and colon are grossly normal without oral contrast. No inflammatory changes, mass lesions or obstructive findings. The terminal ileum and appendix are normal. Vascular/Lymphatic: The aorta is normal in caliber. No atherosclerotic calcifications.  No mesenteric or retroperitoneal mass or adenopathy. Small scattered lymph nodes are noted. Reproductive: Uterine fibroids are noted. The ovaries are unremarkable. Other: No pelvic mass or adenopathy. No free pelvic fluid collections. No inguinal mass or adenopathy. No abdominal wall hernia or subcutaneous lesions. Musculoskeletal: Fused lower thoracic vertebral bodies and facet joints. Suspect inflammatory arthropathy such as psoriatic arthritis or ankylosing spondylitis. Mild SI joint degenerative changes without obvious erosions. Recommend clinical correlation. IMPRESSION: 1. No renal, ureteral or bladder calculi or mass. 2. No acute abdominal/ pelvic findings, mass lesions or adenopathy. 3. Suspect changes of an inflammatory/erosive arthropathy. Recommend clinical correlation. Electronically Signed   By: Marijo Sanes M.D.   On: 01/30/2017 11:50    ____________________________________________   PROCEDURES  Procedure(s) performed:   Procedures  None ____________________________________________   INITIAL IMPRESSION / ASSESSMENT AND PLAN / ED COURSE  Pertinent labs & imaging results that were available during my care of the patient were reviewed by me and considered in my medical decision making (see chart for details).  Patient presents to the ED for evaluation of right flank pain. Ongoing for a "while" but worse over the last 2 days. No tenderness of the abdomen. No GU/Pelvic  symptoms. No evidence to suggest ovarian torsion, TOA. CT renal scan with no stone or other acute pathology. Plan for symptom mgmt at home and PCP follow up.   At this time, I do not feel there is any life-threatening condition present. I have reviewed and discussed all results (EKG, imaging, lab, urine as appropriate), exam findings with patient. I have reviewed nursing notes and appropriate previous records.  I feel the patient is safe to be discharged home without further emergent workup. Discussed usual and  customary return precautions. Patient and family (if present) verbalize understanding and are comfortable with this plan.  Patient will follow-up with their primary care provider. If they do not have a primary care provider, information for follow-up has been provided to them. All questions have been answered.  ____________________________________________  FINAL CLINICAL IMPRESSION(S) / ED DIAGNOSES  Final diagnoses:  Right flank pain     MEDICATIONS GIVEN DURING THIS VISIT:  Medications - No data to display   NEW OUTPATIENT MEDICATIONS STARTED DURING THIS VISIT:  Discharge Medication List as of 01/30/2017 12:15 PM    START taking these medications   Details  cyclobenzaprine (FLEXERIL) 10 MG tablet Take 1 tablet (10 mg total) by mouth 2 (two) times daily as needed for muscle spasms., Starting Wed 01/30/2017, Print    ibuprofen (ADVIL,MOTRIN) 800 MG tablet Take 1 tablet (800 mg total) by mouth every 8 (eight) hours as needed., Starting Wed 01/30/2017, Print        Note:  This document was prepared using Dragon voice recognition software and may include unintentional dictation errors.  Nanda Quinton, MD Emergency Medicine    Long, Wonda Olds, MD 01/31/17 [phone]

## 2017-01-30 NOTE — Discharge Instructions (Signed)

## 2017-01-30 NOTE — ED Triage Notes (Signed)
PT c/o RT flank pain x 2 days; denies urinary sxs

## 2017-03-06 DIAGNOSIS — M722 Plantar fascial fibromatosis: Secondary | ICD-10-CM | POA: Diagnosis not present

## 2017-03-26 ENCOUNTER — Encounter (HOSPITAL_BASED_OUTPATIENT_CLINIC_OR_DEPARTMENT_OTHER): Payer: Self-pay

## 2017-03-26 ENCOUNTER — Emergency Department (HOSPITAL_BASED_OUTPATIENT_CLINIC_OR_DEPARTMENT_OTHER)
Admission: EM | Admit: 2017-03-26 | Discharge: 2017-03-26 | Disposition: A | Discharge status: Home / Self Care | Payer: Medicaid Other | Attending: Emergency Medicine | Admitting: Emergency Medicine

## 2017-03-26 ENCOUNTER — Other Ambulatory Visit: Payer: Self-pay

## 2017-03-26 DIAGNOSIS — H9201 Otalgia, right ear: Secondary | ICD-10-CM | POA: Diagnosis present

## 2017-03-26 DIAGNOSIS — Z79899 Other long term (current) drug therapy: Secondary | ICD-10-CM | POA: Diagnosis not present

## 2017-03-26 DIAGNOSIS — R42 Dizziness and giddiness: Secondary | ICD-10-CM

## 2017-03-26 DIAGNOSIS — H6690 Otitis media, unspecified, unspecified ear: Secondary | ICD-10-CM

## 2017-03-26 DIAGNOSIS — H6691 Otitis media, unspecified, right ear: Secondary | ICD-10-CM | POA: Insufficient documentation

## 2017-03-26 HISTORY — DX: Dizziness and giddiness: R42

## 2017-03-26 MED ORDER — MECLIZINE HCL 25 MG PO TABS: 25.0000 mg | ORAL_TABLET | Freq: Three times a day (TID) | ORAL | 0 refills | Status: DC | PRN

## 2017-03-26 MED ORDER — ONDANSETRON 4 MG PO TBDP: 4.0000 mg | ORAL_TABLET | Freq: Three times a day (TID) | ORAL | 0 refills | Status: DC | PRN

## 2017-03-26 MED ORDER — FLUTICASONE PROPIONATE 50 MCG/ACT NA SUSP: NASAL | 1 refills | Status: DC

## 2017-03-26 MED ORDER — MECLIZINE HCL 25 MG PO TABS
25.0000 mg | ORAL_TABLET | Freq: Once | ORAL | Status: AC
  Administered 2017-03-26: 25 mg via ORAL
  Filled 2017-03-26: qty 1

## 2017-03-26 MED ORDER — AMOXICILLIN 500 MG PO CAPS: 500.0000 mg | ORAL_CAPSULE | Freq: Three times a day (TID) | ORAL | 0 refills | Status: DC

## 2017-03-26 MED ORDER — ONDANSETRON 4 MG PO TBDP
4.0000 mg | ORAL_TABLET | Freq: Once | ORAL | Status: AC
  Administered 2017-03-26: 4 mg via ORAL
  Filled 2017-03-26: qty 1

## 2017-03-26 NOTE — ED Triage Notes (Signed)
C/o right earache, dizziness, nausea x 3 weeks-NAD-steady gait

## 2017-03-26 NOTE — ED Provider Notes (Signed)
[facility]PART[facility] Provider Note   CSN: [id number] Arrival date & time: 03/26/17  1610     History   Chief Complaint Chief Complaint  Patient presents with  . Otalgia    HPI Melissa [name] is a 41 y.o. female. CC: Ear pain and dizziness.  HPI 41 year old female. She reports ear pain for the last 7-10 days. Vertigo with dizziness and nausea for the last 3 days. Muffled hearing in the right ear and some pressure-like pain. No left your symptoms. No headache. No fevers. No cough.  Past Medical History:  Diagnosis Date  . Abnormal Pap smear 08/30/2008   LGSIL  . Allergy   . BV (bacterial vaginosis)    recurring  . Chlamydia   . Fibroid   . Infertility    Secondary PCOS  . PCOS (polycystic ovarian syndrome)   . Shingles outbreak 11/2011  . Vertigo     Patient Active Problem List   Diagnosis Date Noted  . Right ankle injury 03/01/2016  . Rash and nonspecific skin eruption 08/11/2015  . Contraceptive management 10/22/2013  . Cold 10/22/2013  . Uterine fibroids, antepartum condition or complication 19/37/9024  . Ruptured ectopic pregnancy 07/05/2011  . Obesity 12/21/2010    Past Surgical History:  Procedure Laterality Date  . DILATION AND CURETTAGE OF UTERUS  06/2011   ectopic  . EAR TUBE REMOVAL    . right ear surgery    . WISDOM TOOTH EXTRACTION      OB History    Gravida Para Term Preterm AB Living   5 3 3  0 2 3   SAB TAB Ectopic Multiple Live Births   1 0 1 0 3       Home Medications    Prior to Admission medications   Medication Sig Start Date End Date Taking? Authorizing Provider  acetaminophen (TYLENOL) 500 MG tablet Take 1,000 mg by mouth every 6 (six) hours as needed for mild pain.    [provider]  amoxicillin (AMOXIL) 500 MG capsule Take 1 capsule (500 mg total) 3 (three) times daily by mouth. 03/26/17   Tanna Furry, MD  azithromycin (ZITHROMAX) 250 MG tablet Take 1 tablet (250 mg total) by mouth daily.  Take first 2 tablets together, then 1 every day until finished. 08/29/16   Larene Pickett, PA-C  benzonatate (TESSALON) 100 MG capsule Take 1 capsule (100 mg total) by mouth every 8 (eight) hours. 08/29/16   Larene Pickett, PA-C  cyclobenzaprine (FLEXERIL) 10 MG tablet Take 1 tablet (10 mg total) by mouth 2 (two) times daily as needed for muscle spasms. 01/30/17   Long, Wonda Olds, MD  fluticasone Asencion Islam) 50 MCG/ACT nasal spray 1 spray each nares BID 03/26/17   Tanna Furry, MD  ibuprofen (ADVIL,MOTRIN) 800 MG tablet Take 1 tablet (800 mg total) by mouth every 8 (eight) hours as needed. 01/30/17   Long, Wonda Olds, MD  lidocaine (XYLOCAINE) 2 % solution Use as directed 15 mLs in the mouth or throat as needed for mouth pain. 08/29/16   Larene Pickett, PA-C  loratadine (CLARITIN) 10 MG tablet Take 10 mg by mouth daily.    [provider]  meclizine (ANTIVERT) 25 MG tablet Take 1 tablet (25 mg total) 3 (three) times daily as needed by mouth for dizziness. 03/26/17   Tanna Furry, MD  Multiple Vitamins-Minerals (MULTIVITAMIN WITH MINERALS) tablet Take 1 tablet by mouth daily.    [provider]  ondansetron (ZOFRAN ODT) 4 MG disintegrating  tablet Take 1 tablet (4 mg total) every 8 (eight) hours as needed by mouth for nausea. 03/26/17   Tanna Furry, MD    Family History Family History  Problem Relation Age of Onset  . Diabetes Mother   . Hypertension Mother   . Cancer Father   . Fibroids Sister   . Hypertension Sister   . Crohn's disease Brother   . Cancer Brother   . Diabetes Brother   . Hypertension Brother   . Kidney disease Brother   . Anesthesia problems Neg Hx     Social History Social History   Tobacco Use  . Smoking status: Never Smoker  . Smokeless tobacco: Never Used  Substance Use Topics  . Alcohol use: No  . Drug use: No     Allergies   Artichoke [cynara scolymus (artichoke)]   Review of Systems Review of Systems  Constitutional: Negative for appetite  change, chills, diaphoresis, fatigue and fever.  HENT: Positive for ear pain. Negative for mouth sores, sore throat and trouble swallowing.   Eyes: Negative for visual disturbance.  Respiratory: Negative for cough, chest tightness, shortness of breath and wheezing.   Cardiovascular: Negative for chest pain.  Gastrointestinal: Positive for nausea. Negative for abdominal distention, abdominal pain, diarrhea and vomiting.  Endocrine: Negative for polydipsia, polyphagia and polyuria.  Genitourinary: Negative for dysuria, frequency and hematuria.  Musculoskeletal: Negative for gait problem.  Skin: Negative for color change, pallor and rash.  Neurological: Positive for dizziness. Negative for syncope, light-headedness and headaches.  Hematological: Does not bruise/bleed easily.  Psychiatric/Behavioral: Negative for behavioral problems and confusion.     Physical Exam Updated Vital Signs BP 133/80 (BP Location: Left Arm)   Pulse 83   Temp 98.3 F (36.8 C) (Oral)   Resp 18   Ht 5\' 4"  (1.626 m)   Wt 107.5 kg (237 lb)   LMP 03/13/2017   SpO2 100%   BMI 40.68 kg/m   Physical Exam  Constitutional: She is oriented to person, place, and time. She appears well-developed and well-nourished. No distress.  HENT:  Head: Normocephalic.  Right TM bulging. No bullae. She has no nystagmus to lateral gaze. She does complain of the room spinning with standing and movement.  Eyes: Conjunctivae are normal. Pupils are equal, round, and reactive to light. No scleral icterus.  Neck: Normal range of motion. Neck supple. No thyromegaly present.  Cardiovascular: Normal rate and regular rhythm. Exam reveals no gallop and no friction rub.  No murmur heard. Pulmonary/Chest: Effort normal and breath sounds normal. No respiratory distress. She has no wheezes. She has no rales.  Abdominal: Soft. Bowel sounds are normal. She exhibits no distension. There is no tenderness. There is no rebound.  Musculoskeletal:  Normal range of motion.  Neurological: She is alert and oriented to person, place, and time.  Skin: Skin is warm and dry. No rash noted.  Psychiatric: She has a normal mood and affect. Her behavior is normal.     ED Treatments / Results  Labs (all labs ordered are listed, but only abnormal results are displayed) Labs Reviewed - No data to display  EKG  EKG Interpretation None       Radiology No results found.  Procedures Procedures (including critical care time)  Medications Ordered in ED Medications  ondansetron (ZOFRAN-ODT) disintegrating tablet 4 mg (not administered)  meclizine (ANTIVERT) tablet 25 mg (not administered)     Initial Impression / Assessment and Plan / ED Course  I have reviewed the triage  vital signs and the nursing notes.  Pertinent labs & imaging results that were available during my care of the patient were reviewed by me and considered in my medical decision making (see chart for details).   parotitis. Vertigo symptoms. No other cranial nerve abnormalities to suggest deep space infection or central nervous system abnormality. Plan Sudafed OTC. Flonase. Amoxicillin. When necessary Antivert, and Zofran.   Final Clinical Impressions(s) / ED Diagnoses   Final diagnoses:  Acute otitis media, unspecified otitis media type  Vertigo    ED Discharge Orders        Ordered    fluticasone (FLONASE) 50 MCG/ACT nasal spray     03/26/17 1721    amoxicillin (AMOXIL) 500 MG capsule  3 times daily     03/26/17 1721    meclizine (ANTIVERT) 25 MG tablet  3 times daily PRN     03/26/17 1721    ondansetron (ZOFRAN ODT) 4 MG disintegrating tablet  Every 8 hours PRN     03/26/17 1721       Tanna Furry, MD 03/26/17 1724

## 2017-05-08 ENCOUNTER — Emergency Department (HOSPITAL_BASED_OUTPATIENT_CLINIC_OR_DEPARTMENT_OTHER)
Admission: EM | Admit: 2017-05-08 | Discharge: 2017-05-08 | Disposition: A | Discharge status: Home / Self Care | Payer: Medicaid Other | Attending: Emergency Medicine | Admitting: Emergency Medicine

## 2017-05-08 ENCOUNTER — Encounter (HOSPITAL_BASED_OUTPATIENT_CLINIC_OR_DEPARTMENT_OTHER): Payer: Self-pay | Admitting: Emergency Medicine

## 2017-05-08 ENCOUNTER — Emergency Department (HOSPITAL_BASED_OUTPATIENT_CLINIC_OR_DEPARTMENT_OTHER): Payer: Medicaid Other

## 2017-05-08 ENCOUNTER — Other Ambulatory Visit: Payer: Self-pay

## 2017-05-08 DIAGNOSIS — Z79899 Other long term (current) drug therapy: Secondary | ICD-10-CM | POA: Diagnosis not present

## 2017-05-08 DIAGNOSIS — M79605 Pain in left leg: Secondary | ICD-10-CM | POA: Diagnosis present

## 2017-05-08 DIAGNOSIS — M25562 Pain in left knee: Secondary | ICD-10-CM

## 2017-05-08 HISTORY — DX: Pain in left knee: M25.562

## 2017-05-08 HISTORY — DX: Other chronic pain: G89.29

## 2017-05-08 HISTORY — DX: Unspecified ectopic pregnancy without intrauterine pregnancy: O00.90

## 2017-05-08 MED ORDER — NAPROXEN 250 MG PO TABS
500.0000 mg | ORAL_TABLET | Freq: Once | ORAL | Status: DC
  Filled 2017-05-08: qty 2

## 2017-05-08 MED ORDER — NAPROXEN 250 MG PO TABS
500.0000 mg | ORAL_TABLET | Freq: Once | ORAL | Status: AC
  Administered 2017-05-08: 500 mg via ORAL

## 2017-05-08 MED ORDER — NAPROXEN 500 MG PO TABS: 500.0000 mg | ORAL_TABLET | Freq: Two times a day (BID) | ORAL | 0 refills | Status: DC

## 2017-05-08 NOTE — ED Triage Notes (Signed)
L leg pain x ~1 month. States she feels like the pain is coming from her knee. No known injury. States she has taken Motrin, used ice/heat without relief. Pt is ambulatory with limp.

## 2017-05-08 NOTE — ED Provider Notes (Signed)
[facility] EMERGENCY DEPARTMENT Provider Note   CSN: [id number] Arrival date & time: 05/08/17  0117     History   Chief Complaint Chief Complaint  Patient presents with  . Leg Pain    HPI Melissa [name] is a 41 y.o. female.  HPI Patient comes in with chief complaint of left leg pain. Patient reports that she has had pain in her left lower extremity for the past 1 week.  She is unsure what caused the pain, she specifically does not remember any trauma.  Patient reports that her leg intermittently gives out, and the pain is specifically located over the knee and surrounding areas.   Pt has no hx of PE, DVT and denies any exogenous hormone (testosterone / estrogen) use, long distance travels or surgery in the past 6 weeks, active cancer, recent immobilization.  Patient has utilized multitude of over-the-counter medicines without significant relief.  Past Medical History:  Diagnosis Date  . Abnormal Pap smear 08/30/2008   LGSIL  . Allergy   . BV (bacterial vaginosis)    recurring  . Chlamydia   . Chronic pain of left knee   . Ectopic pregnancy   . Fibroid   . Infertility    Secondary PCOS  . PCOS (polycystic ovarian syndrome)   . Shingles outbreak 11/2011  . Vertigo     Patient Active Problem List   Diagnosis Date Noted  . Right ankle injury 03/01/2016  . Rash and nonspecific skin eruption 08/11/2015  . Contraceptive management 10/22/2013  . Cold 10/22/2013  . Uterine fibroids, antepartum condition or complication 34/91/7915  . Ruptured ectopic pregnancy 07/05/2011  . Obesity 12/21/2010    Past Surgical History:  Procedure Laterality Date  . DILATION AND CURETTAGE OF UTERUS  06/2011   ectopic  . EAR TUBE REMOVAL    . ECTOPIC PREGNANCY SURGERY    . LAPAROSCOPY  07/05/2011   Procedure: LAPAROSCOPY OPERATIVE;  Surgeon: Osborne Oman, MD;  Location: [facility];  Service: Gynecology;  Laterality: N/A;  . right ear surgery    . WISDOM TOOTH EXTRACTION       OB History    Gravida Para Term Preterm AB Living   5 3 3  0 2 3   SAB TAB Ectopic Multiple Live Births   1 0 1 0 3       Home Medications    Prior to Admission medications   Medication Sig Start Date End Date Taking? Authorizing Provider  acetaminophen (TYLENOL) 500 MG tablet Take 1,000 mg by mouth every 6 (six) hours as needed for mild pain.    [provider]  amoxicillin (AMOXIL) 500 MG capsule Take 1 capsule (500 mg total) 3 (three) times daily by mouth. 03/26/17   Tanna Furry, MD  azithromycin (ZITHROMAX) 250 MG tablet Take 1 tablet (250 mg total) by mouth daily. Take first 2 tablets together, then 1 every day until finished. 08/29/16   Larene Pickett, PA-C  benzonatate (TESSALON) 100 MG capsule Take 1 capsule (100 mg total) by mouth every 8 (eight) hours. 08/29/16   Larene Pickett, PA-C  cyclobenzaprine (FLEXERIL) 10 MG tablet Take 1 tablet (10 mg total) by mouth 2 (two) times daily as needed for muscle spasms. 01/30/17   Long, Wonda Olds, MD  fluticasone Asencion Islam) 50 MCG/ACT nasal spray 1 spray each nares BID 03/26/17   Tanna Furry, MD  ibuprofen (ADVIL,MOTRIN) 800 MG tablet Take 1 tablet (800 mg total) by mouth every 8 (eight) hours as needed.  01/30/17   Long, Wonda Olds, MD  lidocaine (XYLOCAINE) 2 % solution Use as directed 15 mLs in the mouth or throat as needed for mouth pain. 08/29/16   Larene Pickett, PA-C  loratadine (CLARITIN) 10 MG tablet Take 10 mg by mouth daily.    [provider]  meclizine (ANTIVERT) 25 MG tablet Take 1 tablet (25 mg total) 3 (three) times daily as needed by mouth for dizziness. 03/26/17   Tanna Furry, MD  Multiple Vitamins-Minerals (MULTIVITAMIN WITH MINERALS) tablet Take 1 tablet by mouth daily.    [provider]  naproxen (NAPROSYN) 500 MG tablet Take 1 tablet (500 mg total) by mouth 2 (two) times daily. 05/08/17   Varney Biles, MD  ondansetron (ZOFRAN ODT) 4 MG disintegrating tablet Take 1 tablet (4 mg total) every 8  (eight) hours as needed by mouth for nausea. 03/26/17   Tanna Furry, MD    Family History Family History  Problem Relation Age of Onset  . Diabetes Mother   . Hypertension Mother   . Cancer Father   . Fibroids Sister   . Hypertension Sister   . Crohn's disease Brother   . Cancer Brother   . Diabetes Brother   . Hypertension Brother   . Kidney disease Brother   . Anesthesia problems Neg Hx     Social History Social History   Tobacco Use  . Smoking status: Never Smoker  . Smokeless tobacco: Never Used  Substance Use Topics  . Alcohol use: No  . Drug use: No     Allergies   Artichoke [cynara scolymus (artichoke)]   Review of Systems Review of Systems  Constitutional: Negative for fever.  Musculoskeletal: Positive for arthralgias and gait problem.  Skin: Negative for wound.  Allergic/Immunologic: Negative for immunocompromised state.     Physical Exam Updated Vital Signs Ht 5\' 6"  (1.676 m)   Wt 105.2 kg (232 lb)   LMP 05/07/2017   BMI 37.45 kg/m   Physical Exam  Constitutional: She is oriented to person, place, and time. She appears well-developed.  HENT:  Head: Normocephalic and atraumatic.  Eyes: EOM are normal.  Neck: Normal range of motion. Neck supple.  Cardiovascular: Normal rate.  Pulmonary/Chest: Effort normal.  Abdominal: Bowel sounds are normal.  Musculoskeletal: Normal range of motion. She exhibits tenderness. She exhibits no edema or deformity.  There is no left lower extremity unilateral swelling or pitting edema. Patient has no tenderness over the calf muscles.  Left knee does not have significant effusion, and no palpable tenderness to palpation.  Anterior and posterior drawer's test were negative for any laxity.  Negative valgus and varus stress.  Neurological: She is alert and oriented to person, place, and time.  Skin: Skin is warm and dry.  Nursing note and vitals reviewed.    ED Treatments / Results  Labs (all labs ordered are  listed, but only abnormal results are displayed) Labs Reviewed - No data to display  EKG  EKG Interpretation None       Radiology Dg Knee Complete 4 Views Left  Result Date: 05/08/2017 CLINICAL DATA:  41 year old female with left knee pain.  No trauma. EXAM: LEFT KNEE - COMPLETE 4+ VIEW COMPARISON:  Left knee radiograph dated 10/23/2016 FINDINGS: No evidence of fracture, dislocation, or joint effusion. No evidence of arthropathy or other focal bone abnormality. Soft tissues are unremarkable. IMPRESSION: Negative. Electronically Signed   By: Anner Crete M.D.   On: 05/08/2017 02:59    Procedures Procedures (including  critical care time)  Medications Ordered in ED Medications  naproxen (NAPROSYN) tablet 500 mg (500 mg Oral Given 05/08/17 0236)     Initial Impression / Assessment and Plan / ED Course  I have reviewed the triage vital signs and the nursing notes.  Pertinent labs & imaging results that were available during my care of the patient were reviewed by me and considered in my medical decision making (see chart for details).     Patient comes in with chief complaint of knee pain. History and exam not suggestive of any infection, DVT. The pain appears likely to be soft tissue injury to the left knee, especially since she reports subjective feeling that the knee is popping in and out.  Knee exam revealed no laxity however.  X-ray of the knee ordered to ensure there is no osteoarthritis. If the x-ray is negative, we will discharge with follow-up with sports medicine for further workup. Knee brace to be released.  Rice treatment advised.  Final Clinical Impressions(s) / ED Diagnoses   Final diagnoses:  Acute pain of left knee    ED Discharge Orders        Ordered    naproxen (NAPROSYN) 500 MG tablet  2 times daily     05/08/17 [place], Renuka Farfan, MD 05/08/17 [phone]

## 2017-05-08 NOTE — ED Notes (Signed)
Pt came to nurses station to state she needed to leave due to child care issue. EDP informed and pt dc instructions/prescriptions printed. Pt did not want to stay to get VS or sign for paperwork. Paperwork reviewed with pt prior to dc. Pt verbalizes understanding of instructions and states all questions answered by staff.

## 2017-05-08 NOTE — Discharge Instructions (Signed)
Please see Dr. Timothy Lasso as requested. Use the knee immobilizer.  Use RICE treatment as requested.

## 2017-06-05 ENCOUNTER — Emergency Department (HOSPITAL_COMMUNITY)
Admission: EM | Admit: 2017-06-05 | Discharge: 2017-06-05 | Disposition: A | Discharge status: Home / Self Care | Payer: Medicaid Other | Attending: Emergency Medicine | Admitting: Emergency Medicine

## 2017-06-05 ENCOUNTER — Other Ambulatory Visit: Payer: Self-pay

## 2017-06-05 ENCOUNTER — Encounter (HOSPITAL_COMMUNITY): Payer: Self-pay | Admitting: Emergency Medicine

## 2017-06-05 DIAGNOSIS — J029 Acute pharyngitis, unspecified: Secondary | ICD-10-CM | POA: Diagnosis present

## 2017-06-05 DIAGNOSIS — M25562 Pain in left knee: Secondary | ICD-10-CM | POA: Insufficient documentation

## 2017-06-05 DIAGNOSIS — Z79899 Other long term (current) drug therapy: Secondary | ICD-10-CM | POA: Insufficient documentation

## 2017-06-05 DIAGNOSIS — H6691 Otitis media, unspecified, right ear: Secondary | ICD-10-CM | POA: Diagnosis not present

## 2017-06-05 DIAGNOSIS — G8929 Other chronic pain: Secondary | ICD-10-CM

## 2017-06-05 DIAGNOSIS — H6690 Otitis media, unspecified, unspecified ear: Secondary | ICD-10-CM

## 2017-06-05 LAB — RAPID STREP SCREEN (MED CTR MEBANE ONLY): Streptococcus, Group A Screen (Direct): NEGATIVE

## 2017-06-05 MED ORDER — AMOXICILLIN 500 MG PO CAPS: 500.0000 mg | ORAL_CAPSULE | Freq: Two times a day (BID) | ORAL | 0 refills | Status: AC

## 2017-06-05 NOTE — Discharge Instructions (Signed)
Please take Ibuprofen (Advil, motrin) and Tylenol (acetaminophen) to relieve your pain.  You may take up to 600 MG (3 pills) of normal strength ibuprofen every 8 hours as needed.  In between doses of ibuprofen you make take tylenol, up to 1,000 mg (two extra strength pills).  Do not take more than 3,000 mg tylenol in a 24 hour period.  Please check all medication labels as many medications such as pain and cold medications may contain tylenol.  Do not drink alcohol while taking these medications.  Do not take other NSAID'S while taking ibuprofen (such as aleve or naproxen).  Please take ibuprofen with food to decrease stomach upset. ° °You may have diarrhea from the antibiotics.  It is very important that you continue to take the antibiotics even if you get diarrhea unless a medical professional tells you that you may stop taking them.  If you stop too early the bacteria you are being treated for will become stronger and you may need different, more powerful antibiotics that have more side effects and worsening diarrhea.  Please stay well hydrated and consider probiotics as they may decrease the severity of your diarrhea.  Please be aware that if you take any hormonal contraception (birth control pills, nexplanon, the ring, etc) that your birth control will not work while you are taking antibiotics and you need to use back up protection as directed on the birth control medication information insert.  ° °

## 2017-06-05 NOTE — ED Provider Notes (Signed)
[facility] EMERGENCY DEPARTMENT Provider Note   CSN: [id number] Arrival date & time: 06/05/17  1643     History   Chief Complaint Chief Complaint  Patient presents with  . Leg Pain  . Otalgia  . Sore Throat    HPI Melissa [name] is a 42 y.o. female who presents today for evaluation of chronic left knee pain that she reports caused her to miss work today.  She has previously been seen for this knee pain after she fell last November with reportedly normal x-rays.  She was referred to [facility], however did not follow-up.  She reports that in the past she has tried ibuprofen, Tylenol, cold, heat, and elevation however she has not tried any of those things since December.  She denies being bedridden recently for having major surgery, negative cancer, she denies recent immobilization, states that she was given a knee immobilizer previously however is not wearing it, and does not have any history of DVT.    She also reports that her children recently had ear infections and that her right ear has been hurting her for 2 days.  She also reports sore throat, along with nasal congestion.  No alleviating factors noted, she has not attempted treatment of this prior to arrival.  HPI  Past Medical History:  Diagnosis Date  . Abnormal Pap smear 08/30/2008   LGSIL  . Allergy   . BV (bacterial vaginosis)    recurring  . Chlamydia   . Chronic pain of left knee   . Ectopic pregnancy   . Fibroid   . Infertility    Secondary PCOS  . PCOS (polycystic ovarian syndrome)   . Shingles outbreak 11/2011  . Vertigo     Patient Active Problem List   Diagnosis Date Noted  . Right ankle injury 03/01/2016  . Rash and nonspecific skin eruption 08/11/2015  . Contraceptive management 10/22/2013  . Cold 10/22/2013  . Uterine fibroids, antepartum condition or complication 25/09/3974  . Ruptured ectopic pregnancy 07/05/2011  . Obesity 12/21/2010    Past Surgical History:  Procedure  Laterality Date  . DILATION AND CURETTAGE OF UTERUS  06/2011   ectopic  . EAR TUBE REMOVAL    . ECTOPIC PREGNANCY SURGERY    . LAPAROSCOPY  07/05/2011   Procedure: LAPAROSCOPY OPERATIVE;  Surgeon: Osborne Oman, MD;  Location: [facility];  Service: Gynecology;  Laterality: N/A;  . right ear surgery    . WISDOM TOOTH EXTRACTION      OB History    Gravida Para Term Preterm AB Living   5 3 3  0 2 3   SAB TAB Ectopic Multiple Live Births   1 0 1 0 3       Home Medications    Prior to Admission medications   Medication Sig Start Date End Date Taking? Authorizing Provider  acetaminophen (TYLENOL) 500 MG tablet Take 1,000 mg by mouth every 6 (six) hours as needed for mild pain.    [provider]  amoxicillin (AMOXIL) 500 MG capsule Take 1 capsule (500 mg total) by mouth 2 (two) times daily for 7 days. 06/05/17 06/12/17  Lorin Glass, PA-C  azithromycin (ZITHROMAX) 250 MG tablet Take 1 tablet (250 mg total) by mouth daily. Take first 2 tablets together, then 1 every day until finished. 08/29/16   Larene Pickett, PA-C  benzonatate (TESSALON) 100 MG capsule Take 1 capsule (100 mg total) by mouth every 8 (eight) hours. 08/29/16   Quincy Carnes  M, PA-C  cyclobenzaprine (FLEXERIL) 10 MG tablet Take 1 tablet (10 mg total) by mouth 2 (two) times daily as needed for muscle spasms. 01/30/17   Long, Wonda Olds, MD  fluticasone Asencion Islam) 50 MCG/ACT nasal spray 1 spray each nares BID 03/26/17   Tanna Furry, MD  ibuprofen (ADVIL,MOTRIN) 800 MG tablet Take 1 tablet (800 mg total) by mouth every 8 (eight) hours as needed. 01/30/17   Long, Wonda Olds, MD  lidocaine (XYLOCAINE) 2 % solution Use as directed 15 mLs in the mouth or throat as needed for mouth pain. 08/29/16   Larene Pickett, PA-C  loratadine (CLARITIN) 10 MG tablet Take 10 mg by mouth daily.    [provider]  meclizine (ANTIVERT) 25 MG tablet Take 1 tablet (25 mg total) 3 (three) times daily as needed by mouth for dizziness.  03/26/17   Tanna Furry, MD  Multiple Vitamins-Minerals (MULTIVITAMIN WITH MINERALS) tablet Take 1 tablet by mouth daily.    [provider]  naproxen (NAPROSYN) 500 MG tablet Take 1 tablet (500 mg total) by mouth 2 (two) times daily. 05/08/17   Varney Biles, MD  ondansetron (ZOFRAN ODT) 4 MG disintegrating tablet Take 1 tablet (4 mg total) every 8 (eight) hours as needed by mouth for nausea. 03/26/17   Tanna Furry, MD    Family History Family History  Problem Relation Age of Onset  . Diabetes Mother   . Hypertension Mother   . Cancer Father   . Fibroids Sister   . Hypertension Sister   . Crohn's disease Brother   . Cancer Brother   . Diabetes Brother   . Hypertension Brother   . Kidney disease Brother   . Anesthesia problems Neg Hx     Social History Social History   Tobacco Use  . Smoking status: Never Smoker  . Smokeless tobacco: Never Used  Substance Use Topics  . Alcohol use: No  . Drug use: No     Allergies   Artichoke [cynara scolymus (artichoke)]   Review of Systems Review of Systems  Constitutional: Negative for chills and fever.  HENT: Positive for congestion, ear pain, postnasal drip, rhinorrhea and sore throat. Negative for ear discharge, sinus pressure, sinus pain, trouble swallowing and voice change.   Respiratory: Positive for cough. Negative for shortness of breath and stridor.   Musculoskeletal: Negative for arthralgias.  Neurological: Negative for headaches.     Physical Exam Updated Vital Signs BP 129/68 (BP Location: Right Arm)   Pulse (!) 59   Temp 98.4 F (36.9 C) (Oral)   Resp 16   LMP 06/01/2016   SpO2 99%   Physical Exam  Constitutional: She is oriented to person, place, and time. She appears well-developed and well-nourished.  Non-toxic appearance. No distress.  HENT:  Head: Normocephalic and atraumatic.  Right Ear: Hearing and ear canal normal. A middle ear effusion (TM is erythematous, bulging, ) is present.  Left  Ear: Hearing, tympanic membrane and ear canal normal.  Mouth/Throat: Uvula is midline, oropharynx is clear and moist and mucous membranes are normal. No uvula swelling. No oropharyngeal exudate, posterior oropharyngeal edema, posterior oropharyngeal erythema or tonsillar abscesses. No tonsillar exudate.  Neck: Normal range of motion. Neck supple.  Cardiovascular: Normal rate.  Musculoskeletal:  Left knee is diffusely tender from mid upper leg to mid lower leg.  There is no 1 area that is more painful and tender than the others.  She does not have collateral non-varicose superficial veins, there is no pitting  edema, leg is not larger than the unaffected right leg.  Knee is grossly stable to anterior/posterior drawer test, valgus/varus stress.  Patient is able to walk without difficulty.  No crepitus felt with knee range of motion.  Lymphadenopathy:    She has no cervical adenopathy.  Neurological: She is alert and oriented to person, place, and time.  Skin: Skin is warm and dry.  Psychiatric: She has a normal mood and affect.  Nursing note and vitals reviewed.    ED Treatments / Results  Labs (all labs ordered are listed, but only abnormal results are displayed) Labs Reviewed  RAPID STREP SCREEN (NOT AT [facility])  CULTURE, GROUP A STREP [facility])    EKG  EKG Interpretation None       Radiology No results found.  Procedures Procedures (including critical care time)  Medications Ordered in ED Medications - No data to display   Initial Impression / Assessment and Plan / ED Course  I have reviewed the triage vital signs and the nursing notes.  Pertinent labs & imaging results that were available during my care of the patient were reviewed by me and considered in my medical decision making (see chart for details).     Patients symptoms are consistent with URI, likely viral etiology.  Physical exam is consistent with a right-sided otitis media, and she will be given a prescription for  amoxicillin.  Left knee pain is chronic, waxes and wanes, patient has not attempted any treatment today or within the past few weeks for this.  Wells DVT negative, knee is not erythematous, warm, and patient is able to move her knee, I do not suspect septic arthritis.  Patient was given orthopedics follow-up.  She was given instructions on over-the-counter ibuprofen and Tylenol.  Return precautions were discussed and she stated understanding.  She was given a work note.  Final Clinical Impressions(s) / ED Diagnoses   Final diagnoses:  Chronic pain of left knee  Acute otitis media, unspecified otitis media type    ED Discharge Orders        Ordered    amoxicillin (AMOXIL) 500 MG capsule  2 times daily     06/05/17 1959       Ollen Gross 06/05/17 2009    Margette Fast, MD 06/06/17 [phone]

## 2017-06-05 NOTE — ED Triage Notes (Signed)
Pt. Stated, I fell back in Nov. And my left leg has been bothering me ever since then.   My right ear hurts and I have a sore throat for the last 2 days.

## 2017-06-08 LAB — CULTURE, GROUP A STREP (THRC)

## 2017-06-26 ENCOUNTER — Emergency Department (HOSPITAL_BASED_OUTPATIENT_CLINIC_OR_DEPARTMENT_OTHER): Payer: Medicaid Other

## 2017-06-26 ENCOUNTER — Encounter (HOSPITAL_BASED_OUTPATIENT_CLINIC_OR_DEPARTMENT_OTHER): Payer: Self-pay | Admitting: Emergency Medicine

## 2017-06-26 ENCOUNTER — Other Ambulatory Visit: Payer: Self-pay

## 2017-06-26 ENCOUNTER — Emergency Department (HOSPITAL_BASED_OUTPATIENT_CLINIC_OR_DEPARTMENT_OTHER)
Admission: EM | Admit: 2017-06-26 | Discharge: 2017-06-26 | Disposition: A | Discharge status: Home / Self Care | Payer: Medicaid Other | Attending: Emergency Medicine | Admitting: Emergency Medicine

## 2017-06-26 DIAGNOSIS — Z79899 Other long term (current) drug therapy: Secondary | ICD-10-CM | POA: Insufficient documentation

## 2017-06-26 DIAGNOSIS — R059 Cough, unspecified: Secondary | ICD-10-CM

## 2017-06-26 DIAGNOSIS — R05 Cough: Secondary | ICD-10-CM | POA: Diagnosis present

## 2017-06-26 MED ORDER — ALBUTEROL SULFATE HFA 108 (90 BASE) MCG/ACT IN AERS
2.0000 | INHALATION_SPRAY | RESPIRATORY_TRACT | Status: DC | PRN
  Administered 2017-06-26: 2 via RESPIRATORY_TRACT
  Filled 2017-06-26 (×2): qty 6.7

## 2017-06-26 MED ORDER — BENZONATATE 100 MG PO CAPS
100.0000 mg | ORAL_CAPSULE | Freq: Once | ORAL | Status: AC
  Administered 2017-06-26: 100 mg via ORAL
  Filled 2017-06-26: qty 1

## 2017-06-26 MED ORDER — BENZONATATE 100 MG PO CAPS: 100.0000 mg | ORAL_CAPSULE | Freq: Three times a day (TID) | ORAL | 0 refills | Status: DC

## 2017-06-26 NOTE — Discharge Instructions (Signed)
Please read and follow all provided instructions.  Your diagnoses today include:  1. Cough     Tests performed today include:  Chest x-ray - does not show any pneumonia  Vital signs. See below for your results today.   Medications prescribed:   Tessalon Perles - cough suppressant medication   Albuterol inhaler - medication that opens up your airway  Use inhaler as follows: 1-2 puffs with spacer every 4 hours as needed for wheezing, cough, or shortness of breath.   Take any prescribed medications only as directed.  Home care instructions:  Follow any educational materials contained in this packet.  Follow-up instructions: Please follow-up with your primary care provider in the next 3 days for further evaluation of your symptoms and a recheck if you are not feeling better.   Return instructions:   Please return to the Emergency Department if you experience worsening symptoms.  Please return with worsening wheezing, shortness of breath, or difficulty breathing.  Return with persistent fever above 101F.   Please return if you have any other emergent concerns.  Additional Information:  Your vital signs today were: BP 128/73 (BP Location: Right Arm)    Pulse 65    Temp 98.7 F (37.1 C) (Oral)    Resp 18    Ht 5\' 5"  (1.651 m)    Wt 107.5 kg (237 lb)    LMP 05/28/2017 (Exact Date)    SpO2 99%    BMI 39.44 kg/m  If your blood pressure (BP) was elevated above 135/85 this visit, please have this repeated by your doctor within one month. --------------

## 2017-06-26 NOTE — ED Notes (Signed)
NAD at this time. Pt is stable and going home.  

## 2017-06-26 NOTE — ED Provider Notes (Signed)
[facility] EMERGENCY DEPARTMENT Provider Note   CSN: [id number] Arrival date & time: 06/26/17  0906     History   Chief Complaint Chief Complaint  Patient presents with  . Influenza    HPI Melissa [name] is a 42 y.o. female.  Patient presents to the emergency department with complaint of cough for the past 3 weeks.  Cough is productive of green sputum at times.  She does have occasional wheezing.  She denies any current fevers.  Also reports night sweats.  No weight loss or hemoptysis.  Patient has chronic ear pain and vertigo which is worse than baseline recently due to her illness.  She complains of right ear pain and intermittent loss of voice.  No vomiting or diarrhea.  No chest pain or shortness of breath.  No urinary symptoms.  She has been using over-the-counter medications without relief.  Onset of symptoms acute.  Course is persistent.  Nothing makes symptoms better or worse.  Reports she has had kids at home that have been sick recently.      Past Medical History:  Diagnosis Date  . Abnormal Pap smear 08/30/2008   LGSIL  . Allergy   . BV (bacterial vaginosis)    recurring  . Chlamydia   . Chronic pain of left knee   . Ectopic pregnancy   . Fibroid   . Infertility    Secondary PCOS  . PCOS (polycystic ovarian syndrome)   . Shingles outbreak 11/2011  . Vertigo     Patient Active Problem List   Diagnosis Date Noted  . Right ankle injury 03/01/2016  . Rash and nonspecific skin eruption 08/11/2015  . Contraceptive management 10/22/2013  . Cold 10/22/2013  . Uterine fibroids, antepartum condition or complication 51/76/1607  . Ruptured ectopic pregnancy 07/05/2011  . Obesity 12/21/2010    Past Surgical History:  Procedure Laterality Date  . DILATION AND CURETTAGE OF UTERUS  06/2011   ectopic  . EAR TUBE REMOVAL    . ECTOPIC PREGNANCY SURGERY    . LAPAROSCOPY  07/05/2011   Procedure: LAPAROSCOPY OPERATIVE;  Surgeon: Osborne Oman, MD;   Location: [facility];  Service: Gynecology;  Laterality: N/A;  . right ear surgery    . WISDOM TOOTH EXTRACTION      OB History    Gravida Para Term Preterm AB Living   5 3 3  0 2 3   SAB TAB Ectopic Multiple Live Births   1 0 1 0 3       Home Medications    Prior to Admission medications   Medication Sig Start Date End Date Taking? Authorizing Provider  acetaminophen (TYLENOL) 500 MG tablet Take 1,000 mg by mouth every 6 (six) hours as needed for mild pain.    [provider]  azithromycin (ZITHROMAX) 250 MG tablet Take 1 tablet (250 mg total) by mouth daily. Take first 2 tablets together, then 1 every day until finished. 08/29/16   Larene Pickett, PA-C  benzonatate (TESSALON) 100 MG capsule Take 1 capsule (100 mg total) by mouth every 8 (eight) hours. 08/29/16   Larene Pickett, PA-C  cyclobenzaprine (FLEXERIL) 10 MG tablet Take 1 tablet (10 mg total) by mouth 2 (two) times daily as needed for muscle spasms. 01/30/17   Long, Wonda Olds, MD  fluticasone Asencion Islam) 50 MCG/ACT nasal spray 1 spray each nares BID 03/26/17   Tanna Furry, MD  ibuprofen (ADVIL,MOTRIN) 800 MG tablet Take 1 tablet (800 mg total) by mouth every 8 (  eight) hours as needed. 01/30/17   Long, Wonda Olds, MD  lidocaine (XYLOCAINE) 2 % solution Use as directed 15 mLs in the mouth or throat as needed for mouth pain. 08/29/16   Larene Pickett, PA-C  loratadine (CLARITIN) 10 MG tablet Take 10 mg by mouth daily.    [provider]  meclizine (ANTIVERT) 25 MG tablet Take 1 tablet (25 mg total) 3 (three) times daily as needed by mouth for dizziness. 03/26/17   Tanna Furry, MD  Multiple Vitamins-Minerals (MULTIVITAMIN WITH MINERALS) tablet Take 1 tablet by mouth daily.    [provider]  naproxen (NAPROSYN) 500 MG tablet Take 1 tablet (500 mg total) by mouth 2 (two) times daily. 05/08/17   Varney Biles, MD  ondansetron (ZOFRAN ODT) 4 MG disintegrating tablet Take 1 tablet (4 mg total) every 8 (eight) hours as  needed by mouth for nausea. 03/26/17   Tanna Furry, MD  diphenhydrAMINE (BENADRYL) 12.5 MG/5ML liquid Take 12.5 mg by mouth once.  08/01/11  [provider]    Family History Family History  Problem Relation Age of Onset  . Diabetes Mother   . Hypertension Mother   . Cancer Father   . Fibroids Sister   . Hypertension Sister   . Crohn's disease Brother   . Cancer Brother   . Diabetes Brother   . Hypertension Brother   . Kidney disease Brother   . Anesthesia problems Neg Hx     Social History Social History   Tobacco Use  . Smoking status: Never Smoker  . Smokeless tobacco: Never Used  Substance Use Topics  . Alcohol use: No  . Drug use: No     Allergies   Artichoke [cynara scolymus (artichoke)]   Review of Systems Review of Systems  Constitutional: Positive for fatigue. Negative for chills, fever and unexpected weight change.  HENT: Positive for congestion, ear pain and sore throat. Negative for facial swelling, rhinorrhea and sinus pressure.   Eyes: Negative for redness.  Respiratory: Positive for cough and wheezing. Negative for shortness of breath.   Gastrointestinal: Negative for abdominal pain, diarrhea, nausea and vomiting.  Genitourinary: Negative for dysuria.  Musculoskeletal: Negative for myalgias and neck stiffness.  Skin: Negative for rash.  Neurological: Positive for dizziness. Negative for headaches.  Hematological: Negative for adenopathy.     Physical Exam Updated Vital Signs BP 128/73 (BP Location: Right Arm)   Pulse 65   Temp 98.7 F (37.1 C) (Oral)   Resp 18   Ht 5\' 5"  (1.651 m)   Wt 107.5 kg (237 lb)   LMP 05/28/2017 (Exact Date)   SpO2 99%   BMI 39.44 kg/m   Physical Exam  Constitutional: She appears well-developed and well-nourished.  HENT:  Head: Normocephalic and atraumatic.  Right Ear: Tympanic membrane, external ear and ear canal normal. Tympanic membrane is not perforated, not erythematous, not retracted and not  bulging.  Left Ear: External ear and ear canal normal. Tympanic membrane is perforated (Chronic appearing). Tympanic membrane is not erythematous, not retracted and not bulging.  Nose: Nose normal. No mucosal edema or rhinorrhea.  Mouth/Throat: Uvula is midline, oropharynx is clear and moist and mucous membranes are normal. Mucous membranes are not dry. No oral lesions. No trismus in the jaw. No uvula swelling. No oropharyngeal exudate, posterior oropharyngeal edema, posterior oropharyngeal erythema or tonsillar abscesses.  Eyes: Conjunctivae are normal. Right eye exhibits no discharge. Left eye exhibits no discharge.  Neck: Normal range of motion. Neck supple.  Cardiovascular:  Normal rate, regular rhythm and normal heart sounds.  Pulmonary/Chest: Effort normal and breath sounds normal. No respiratory distress. She has no wheezes. She has no rales.  Occasional cough during exam.  Abdominal: Soft. There is no tenderness. There is no rebound and no guarding.  Lymphadenopathy:    She has no cervical adenopathy.  Neurological: She is alert.  Skin: Skin is warm and dry.  Psychiatric: She has a normal mood and affect.  Nursing note and vitals reviewed.    ED Treatments / Results  Labs (all labs ordered are listed, but only abnormal results are displayed) Labs Reviewed - No data to display  EKG  EKG Interpretation None       Radiology Dg Chest 2 View  Result Date: 06/26/2017 CLINICAL DATA:  Flu like symptoms for 3 weeks. EXAM: CHEST  2 VIEW COMPARISON:  08/29/2016 FINDINGS: Moderate lower thoracic spondylosis. Midline trachea.  Normal heart size and mediastinal contours. Sharp costophrenic angles.  No pneumothorax.  Clear lungs. IMPRESSION: No active cardiopulmonary disease. Electronically Signed   By: Abigail Miyamoto M.D.   On: 06/26/2017 09:43    Procedures Procedures (including critical care time)  Medications Ordered in ED Medications  albuterol (PROVENTIL HFA;VENTOLIN HFA) 108  (90 Base) MCG/ACT inhaler 2 puff (2 puffs Inhalation Given 06/26/17 1000)  benzonatate (TESSALON) capsule 100 mg (not administered)     Initial Impression / Assessment and Plan / ED Course  I have reviewed the triage vital signs and the nursing notes.  Pertinent labs & imaging results that were available during my care of the patient were reviewed by me and considered in my medical decision making (see chart for details).     Patient seen and examined.  Given duration of symptoms, will obtain chest x-ray.  Vital signs reviewed and are as follows: BP 128/73 (BP Location: Right Arm)   Pulse 65   Temp 98.7 F (37.1 C) (Oral)   Resp 18   Ht 5\' 5"  (1.651 m)   Wt 107.5 kg (237 lb)   LMP 05/28/2017 (Exact Date)   SpO2 99%   BMI 39.44 kg/m   Chest x-ray reassuring.  Will treat with Tessalon and albuterol inhaler to use when wheezing occurs.  Encourage PCP follow-up for recheck if not improved in the next 1-2 weeks.  Patient counseled on supportive care for viral URI and s/s to return including worsening symptoms, persistent fever, persistent vomiting, or if they have any other concerns. Urged to see PCP if symptoms persist for more than 3 days. Patient verbalizes understanding and agrees with plan.    Final Clinical Impressions(s) / ED Diagnoses   Final diagnoses:  Cough   Pt with cough and laryngitis sx for 3 weeks. CXR clear. She appears well. No concern for PNA given normal lung exam/x-ray. Antibiotics not indicated. Conservative therapy indicated.     ED Discharge Orders        Ordered    benzonatate (TESSALON) 100 MG capsule  Every 8 hours     06/26/17 1018       Carlisle Cater, PA-C 06/26/17 1021    Fatima Blank, MD 06/26/17 1323

## 2017-06-26 NOTE — ED Triage Notes (Signed)
Flu like s/s x 3 weeks

## 2017-07-07 ENCOUNTER — Emergency Department (HOSPITAL_BASED_OUTPATIENT_CLINIC_OR_DEPARTMENT_OTHER): Payer: Medicaid Other

## 2017-07-07 ENCOUNTER — Other Ambulatory Visit: Payer: Self-pay

## 2017-07-07 ENCOUNTER — Emergency Department (HOSPITAL_BASED_OUTPATIENT_CLINIC_OR_DEPARTMENT_OTHER)
Admission: EM | Admit: 2017-07-07 | Discharge: 2017-07-07 | Disposition: A | Discharge status: Home / Self Care | Payer: Medicaid Other | Attending: Emergency Medicine | Admitting: Emergency Medicine

## 2017-07-07 ENCOUNTER — Encounter (HOSPITAL_BASED_OUTPATIENT_CLINIC_OR_DEPARTMENT_OTHER): Payer: Self-pay | Admitting: Emergency Medicine

## 2017-07-07 DIAGNOSIS — H9201 Otalgia, right ear: Secondary | ICD-10-CM | POA: Diagnosis present

## 2017-07-07 DIAGNOSIS — Z79899 Other long term (current) drug therapy: Secondary | ICD-10-CM | POA: Diagnosis not present

## 2017-07-07 LAB — PREGNANCY, URINE: Preg Test, Ur: NEGATIVE

## 2017-07-07 MED ORDER — ACYCLOVIR 800 MG PO TABS: 800.0000 mg | ORAL_TABLET | Freq: Every day | ORAL | 0 refills | Status: DC

## 2017-07-07 MED ORDER — TRAMADOL HCL 50 MG PO TABS: 50.0000 mg | ORAL_TABLET | Freq: Four times a day (QID) | ORAL | 0 refills | Status: DC | PRN

## 2017-07-07 MED ORDER — KETOROLAC TROMETHAMINE 30 MG/ML IJ SOLN
15.0000 mg | Freq: Once | INTRAMUSCULAR | Status: AC
  Administered 2017-07-07: 15 mg via INTRAMUSCULAR
  Filled 2017-07-07: qty 1

## 2017-07-07 NOTE — ED Triage Notes (Signed)
Pt reports R ear pain x 1 week. Pt states she thinks it is shingles because she has had it over 40 times. No rash noted. Pt currently being tx for an ear infection.

## 2017-07-07 NOTE — Discharge Instructions (Signed)
Your ct scan showed chronic otitis media. Will treat you with the antiviral medication. You need to take nsaids such as Motrin or Aleve for pain.  Continue take Tylenol.  Have given you short course of tramadol to take at night for pain.  This medication make you drowsy do not drive with it.  Make sure that you continue her antibiotics and follow-up with the ENT doctor.  Return the ED with any worsening symptoms.

## 2017-07-07 NOTE — ED Notes (Signed)
Pt given d/c instructions as per chart. Rx x 1. Verbalizes understanding. No questions. 

## 2017-07-07 NOTE — ED Notes (Signed)
Urine requested from pt. Specimen cup given.

## 2017-07-07 NOTE — ED Notes (Signed)
Waiting on upt results before CT scan

## 2017-07-08 NOTE — ED Provider Notes (Signed)
[facility] EMERGENCY DEPARTMENT Provider Note   CSN: [id number] Arrival date & time: 07/07/17  1826     History   Chief Complaint Chief Complaint  Patient presents with  . Otalgia    HPI Melissa [name] is a 42 y.o. female.  HPI 42 year old African-American female past medical history significant for recurring otitis media, recurring shingles that presents to the emergency department today with complaints of 1 week of right ear pain.  Patient states that the ear pain got significantly worse 3 days ago.  She reports a burning sensation.  She states that a small pustule lesions popped up on her right ear today.  Patient reports having history of shingles 43 times.  She states that this feels the exact same as when she had it before.  Patient states that she did see her primary care doctor last week for ear pain and put her on antibiotics for otitis media.  Patient states that she is still taking amoxicillin.  Patient denies any decreased hearing of the right ear.  Denies any associated fevers.  Denies any vision changes.  Patient states that the area itches and is very painful.  Describes it as a burning sensation.  Nothing makes her symptoms better or worse.  She has tried over-the-counter hydrocortisone with only little relief. Past Medical History:  Diagnosis Date  . Abnormal Pap smear 08/30/2008   LGSIL  . Allergy   . BV (bacterial vaginosis)    recurring  . Chlamydia   . Chronic pain of left knee   . Ectopic pregnancy   . Fibroid   . Infertility    Secondary PCOS  . PCOS (polycystic ovarian syndrome)   . Shingles outbreak 11/2011  . Vertigo     Patient Active Problem List   Diagnosis Date Noted  . Right ankle injury 03/01/2016  . Rash and nonspecific skin eruption 08/11/2015  . Contraceptive management 10/22/2013  . Cold 10/22/2013  . Uterine fibroids, antepartum condition or complication 40/98/1191  . Ruptured ectopic pregnancy 07/05/2011  . Obesity  12/21/2010    Past Surgical History:  Procedure Laterality Date  . DILATION AND CURETTAGE OF UTERUS  06/2011   ectopic  . EAR TUBE REMOVAL    . ECTOPIC PREGNANCY SURGERY    . LAPAROSCOPY  07/05/2011   Procedure: LAPAROSCOPY OPERATIVE;  Surgeon: Osborne Oman, MD;  Location: [facility];  Service: Gynecology;  Laterality: N/A;  . right ear surgery    . WISDOM TOOTH EXTRACTION      OB History    Gravida Para Term Preterm AB Living   5 3 3  0 2 3   SAB TAB Ectopic Multiple Live Births   1 0 1 0 3       Home Medications    Prior to Admission medications   Medication Sig Start Date End Date Taking? Authorizing Provider  acetaminophen (TYLENOL) 500 MG tablet Take 1,000 mg by mouth every 6 (six) hours as needed for mild pain.    [provider]  acyclovir (ZOVIRAX) 800 MG tablet Take 1 tablet (800 mg total) by mouth 5 (five) times daily. 07/07/17   Doristine Devoid, PA-C  azithromycin (ZITHROMAX) 250 MG tablet Take 1 tablet (250 mg total) by mouth daily. Take first 2 tablets together, then 1 every day until finished. 08/29/16   Larene Pickett, PA-C  benzonatate (TESSALON) 100 MG capsule Take 1 capsule (100 mg total) by mouth every 8 (eight) hours. 06/26/17   Carlisle Cater, PA-C  cyclobenzaprine (FLEXERIL) 10 MG tablet Take 1 tablet (10 mg total) by mouth 2 (two) times daily as needed for muscle spasms. 01/30/17   Long, Wonda Olds, MD  fluticasone Asencion Islam) 50 MCG/ACT nasal spray 1 spray each nares BID 03/26/17   Tanna Furry, MD  ibuprofen (ADVIL,MOTRIN) 800 MG tablet Take 1 tablet (800 mg total) by mouth every 8 (eight) hours as needed. 01/30/17   Long, Wonda Olds, MD  lidocaine (XYLOCAINE) 2 % solution Use as directed 15 mLs in the mouth or throat as needed for mouth pain. 08/29/16   Larene Pickett, PA-C  loratadine (CLARITIN) 10 MG tablet Take 10 mg by mouth daily.    [provider]  meclizine (ANTIVERT) 25 MG tablet Take 1 tablet (25 mg total) 3 (three) times daily as  needed by mouth for dizziness. 03/26/17   Tanna Furry, MD  Multiple Vitamins-Minerals (MULTIVITAMIN WITH MINERALS) tablet Take 1 tablet by mouth daily.    [provider]  naproxen (NAPROSYN) 500 MG tablet Take 1 tablet (500 mg total) by mouth 2 (two) times daily. 05/08/17   Varney Biles, MD  ondansetron (ZOFRAN ODT) 4 MG disintegrating tablet Take 1 tablet (4 mg total) every 8 (eight) hours as needed by mouth for nausea. 03/26/17   Tanna Furry, MD  traMADol (ULTRAM) 50 MG tablet Take 1 tablet (50 mg total) by mouth every 6 (six) hours as needed. 07/07/17   Doristine Devoid, PA-C  diphenhydrAMINE (BENADRYL) 12.5 MG/5ML liquid Take 12.5 mg by mouth once.  08/01/11  [provider]    Family History Family History  Problem Relation Age of Onset  . Diabetes Mother   . Hypertension Mother   . Cancer Father   . Fibroids Sister   . Hypertension Sister   . Crohn's disease Brother   . Cancer Brother   . Diabetes Brother   . Hypertension Brother   . Kidney disease Brother   . Anesthesia problems Neg Hx     Social History Social History   Tobacco Use  . Smoking status: Never Smoker  . Smokeless tobacco: Never Used  Substance Use Topics  . Alcohol use: No  . Drug use: No     Allergies   Artichoke [cynara scolymus (artichoke)]   Review of Systems Review of Systems  Constitutional: Negative for chills and fever.  HENT: Positive for ear pain. Negative for congestion, ear discharge, sinus pressure, sinus pain and sneezing.   Respiratory: Negative for cough.   Skin: Positive for wound.  Neurological: Negative for headaches.     Physical Exam Updated Vital Signs BP 130/83 (BP Location: Left Arm)   Pulse 62   Temp 98.2 F (36.8 C) (Oral)   Resp 20   Ht 5\' 5"  (1.651 m)   Wt 107.5 kg (237 lb)   LMP 07/02/2017   SpO2 100%   BMI 39.44 kg/m   Physical Exam  Constitutional: She appears well-developed and well-nourished. No distress.  HENT:  Head:  Normocephalic and atraumatic.  TM visualized with perforation from prior station tubes.  No signs of acute infection at this time.  Left ear canal was unremarkable.  No pain with manipulation of the tragus or auricle.  Right TM with erythema and scarring noted.  Ear canal is normal.  No pain with manipulation of the auricle or tragus.  She does have some mastoid tenderness on the right side with minimal erythema or edema.  Patient also has a small vesicular lesion to the auricle of  the right ear.  There is no drainage noted.  No signs of an abscess.  Noted.  Eyes: Conjunctivae and EOM are normal. Pupils are equal, round, and reactive to light. Right eye exhibits no discharge. Left eye exhibits no discharge. No scleral icterus.  No lesions noted around the eye.  Neck: Normal range of motion. Neck supple.  Pulmonary/Chest: No respiratory distress.  Musculoskeletal: Normal range of motion.  Neurological: She is alert.  No facial asymmetry.  Skin: Skin is warm and dry. Capillary refill takes less than 2 seconds. No pallor.  Psychiatric: Her behavior is normal. Judgment and thought content normal.  Nursing note and vitals reviewed.      ED Treatments / Results  Labs (all labs ordered are listed, but only abnormal results are displayed) Labs Reviewed  PREGNANCY, URINE    EKG  EKG Interpretation None       Radiology Ct Temporal Bones Wo Contrast  Result Date: 07/07/2017 CLINICAL DATA:  RIGHT ear pain for 2 days.  History of shingles. EXAM: CT TEMPORAL BONES WITHOUT CONTRAST TECHNIQUE: Axial and coronal plane CT imaging of the petrous temporal bones was performed with thin-collimation image reconstruction. No intravenous contrast was administered. Multiplanar CT image reconstructions were also generated. COMPARISON:  CT HEAD temporal bone CT October 30, 2016 FINDINGS: RIGHT: External auditory canal is well formed and well aerated. Chronically thickened and retracted RIGHT tympanic membrane.  Disc uterine remains sharp. Soft tissue within middle ear (meso- and hypo tympanum) including Prussak's space. Ossicles are well formed and located. Patent aditus ad antrum. Well aerated mastoid air cells without coalescence. Minimal soft tissue within pneumatized RIGHT petrous apices without destructive bony changes. Intact tegmen tympani. Intact otic capsule with normal appearance of the inner ear structures. No internal auditory canal expansion. No definite cerebellar pontine angle masses. LEFT: External auditory canal is well formed and aerated. Tympanic membrane is not thickened or retracted. The scutum is sharp. Well aerated middle ear including Prussak's space. Ossicles are well formed and located. Patent aditus ad antrum. Well aerated mastoid air cells without coalescence. Intact tegmen tympani. Intact otic capsule with normal appearance of the inner ear structures. No internal auditory canal expansion. No definite cerebellar pontine angle masses. Trace sphenoethmoidal mucosal thickening without air-fluid levels. Hypoplastic frontal sinuses. IMPRESSION: RIGHT: Chronic otitis media without ossicular erosions. No mastoiditis. LEFT: Negative. Electronically Signed   By: Elon Alas M.D.   On: 07/07/2017 23:03    Procedures Procedures (including critical care time)  Medications Ordered in ED Medications  ketorolac (TORADOL) 30 MG/ML injection 15 mg (15 mg Intramuscular Given 07/07/17 2334)     Initial Impression / Assessment and Plan / ED Course  I have reviewed the triage vital signs and the nursing notes.  Pertinent labs & imaging results that were available during my care of the patient were reviewed by me and considered in my medical decision making (see chart for details).     Patient presents to the ED with complaints of right ear pain.  Patient does have history of chronic otitis media.  She is currently on antibiotics for an otitis media.  Patient also states that she has had  shingles several times.  She states that this feels very similar.  The pain started 3 days ago and then she started having vesicular lesions to her right ear.  Given the mastoid tenderness with the ongoing ear pain ct scan was obtained. No signs of acute infection or mastoiditis.  Will treat with antivirals and  pain medication given likely recurrent shingles.  Patient has no signs of Ramsay Hunt syndrome.  She seems to have no ocular involvement.  Discussed patient needs a follow-up with her primary care doctor and her ENT doctor.  Pt is hemodynamically stable, in NAD, & able to ambulate in the ED. Evaluation does not show pathology that would require ongoing emergent intervention or inpatient treatment. I explained the diagnosis to the patient. Pain has been managed & has no complaints prior to dc. Pt is comfortable with above plan and is stable for discharge at this time. All questions were answered prior to disposition. Strict return precautions for f/u to the ED were discussed. Encouraged follow up with PCP.   Final Clinical Impressions(s) / ED Diagnoses   Final diagnoses:  Right ear pain    ED Discharge Orders        Ordered    acyclovir (ZOVIRAX) 800 MG tablet  5 times daily     07/07/17 2319    traMADol (ULTRAM) 50 MG tablet  Every 6 hours PRN     07/07/17 2319       Doristine Devoid, PA-C 07/08/17 0237    Hayden Rasmussen, MD 07/08/17 [id number]

## 2017-08-05 ENCOUNTER — Encounter (HOSPITAL_BASED_OUTPATIENT_CLINIC_OR_DEPARTMENT_OTHER): Payer: Self-pay | Admitting: Emergency Medicine

## 2017-08-05 ENCOUNTER — Emergency Department (HOSPITAL_BASED_OUTPATIENT_CLINIC_OR_DEPARTMENT_OTHER)
Admission: EM | Admit: 2017-08-05 | Discharge: 2017-08-05 | Disposition: A | Discharge status: Home / Self Care | Payer: Medicaid Other | Attending: Emergency Medicine | Admitting: Emergency Medicine

## 2017-08-05 ENCOUNTER — Other Ambulatory Visit: Payer: Self-pay

## 2017-08-05 DIAGNOSIS — R51 Headache: Secondary | ICD-10-CM | POA: Insufficient documentation

## 2017-08-05 DIAGNOSIS — Z5321 Procedure and treatment not carried out due to patient leaving prior to being seen by health care provider: Secondary | ICD-10-CM | POA: Insufficient documentation

## 2017-08-05 NOTE — ED Triage Notes (Addendum)
Reports nausea and headache which began today.  Reports sneezing and cough.  Also reports right ear ringing.  States took ibuprofen and tylenol without relief.  Reports frontal lobe headache.

## 2017-08-06 ENCOUNTER — Emergency Department (HOSPITAL_BASED_OUTPATIENT_CLINIC_OR_DEPARTMENT_OTHER)
Admission: EM | Admit: 2017-08-06 | Discharge: 2017-08-06 | Disposition: A | Discharge status: Home / Self Care | Payer: Medicaid Other | Attending: Emergency Medicine | Admitting: Emergency Medicine

## 2017-08-06 ENCOUNTER — Other Ambulatory Visit: Payer: Self-pay

## 2017-08-06 ENCOUNTER — Encounter (HOSPITAL_BASED_OUTPATIENT_CLINIC_OR_DEPARTMENT_OTHER): Payer: Self-pay | Admitting: *Deleted

## 2017-08-06 DIAGNOSIS — R51 Headache: Secondary | ICD-10-CM | POA: Insufficient documentation

## 2017-08-06 DIAGNOSIS — Z79899 Other long term (current) drug therapy: Secondary | ICD-10-CM | POA: Diagnosis not present

## 2017-08-06 DIAGNOSIS — R519 Headache, unspecified: Secondary | ICD-10-CM

## 2017-08-06 DIAGNOSIS — R55 Syncope and collapse: Secondary | ICD-10-CM

## 2017-08-06 LAB — CBC WITH DIFFERENTIAL/PLATELET
Basophils Absolute: 0 10*3/uL (ref 0.0–0.1)
Basophils Relative: 1 %
EOS PCT: 3 %
Eosinophils Absolute: 0.1 10*3/uL (ref 0.0–0.7)
HCT: 37.5 % (ref 36.0–46.0)
Hemoglobin: 12.2 g/dL (ref 12.0–15.0)
LYMPHS PCT: 34 %
Lymphs Abs: 1.5 10*3/uL (ref 0.7–4.0)
MCH: 28.6 pg (ref 26.0–34.0)
MCHC: 32.5 g/dL (ref 30.0–36.0)
MCV: 88 fL (ref 78.0–100.0)
MONO ABS: 0.5 10*3/uL (ref 0.1–1.0)
Monocytes Relative: 12 %
Neutro Abs: 2.2 10*3/uL (ref 1.7–7.7)
Neutrophils Relative %: 50 %
PLATELETS: 295 10*3/uL (ref 150–400)
RBC: 4.26 MIL/uL (ref 3.87–5.11)
RDW: 14.7 % (ref 11.5–15.5)
WBC: 4.4 10*3/uL (ref 4.0–10.5)

## 2017-08-06 LAB — URINALYSIS, ROUTINE W REFLEX MICROSCOPIC
BILIRUBIN URINE: NEGATIVE
GLUCOSE, UA: NEGATIVE mg/dL
HGB URINE DIPSTICK: NEGATIVE
KETONES UR: NEGATIVE mg/dL
Leukocytes, UA: NEGATIVE
Nitrite: NEGATIVE
Protein, ur: NEGATIVE mg/dL
Specific Gravity, Urine: >1.03 — ABNORMAL HIGH (ref 1.005–1.030)
pH: 5.5 (ref 5.0–8.0)

## 2017-08-06 LAB — BASIC METABOLIC PANEL
Anion gap: 8 (ref 5–15)
BUN: 19 mg/dL (ref 6–20)
CO2: 28 mmol/L (ref 22–32)
Calcium: 8.7 mg/dL — ABNORMAL LOW (ref 8.9–10.3)
Chloride: 103 mmol/L (ref 101–111)
Creatinine, Ser: 0.71 mg/dL (ref 0.44–1.00)
GFR calc Af Amer: >60 mL/min (ref >60)
GLUCOSE: 87 mg/dL (ref 65–99)
Potassium: 3.5 mmol/L (ref 3.5–5.1)
Sodium: 139 mmol/L (ref 135–145)

## 2017-08-06 LAB — CBG MONITORING, ED: GLUCOSE-CAPILLARY: 74 mg/dL (ref 65–99)

## 2017-08-06 LAB — PREGNANCY, URINE: Preg Test, Ur: NEGATIVE

## 2017-08-06 MED ORDER — PROCHLORPERAZINE EDISYLATE 5 MG/ML IJ SOLN
10.0000 mg | Freq: Once | INTRAMUSCULAR | Status: AC
  Administered 2017-08-06: 10 mg via INTRAVENOUS
  Filled 2017-08-06: qty 2

## 2017-08-06 MED ORDER — SODIUM CHLORIDE 0.9 % IV BOLUS
1000.0000 mL | Freq: Once | INTRAVENOUS | Status: AC
  Administered 2017-08-06: 1000 mL via INTRAVENOUS

## 2017-08-06 MED ORDER — NAPROXEN 500 MG PO TABS: 500.0000 mg | ORAL_TABLET | Freq: Two times a day (BID) | ORAL | 0 refills | Status: DC

## 2017-08-06 MED ORDER — KETOROLAC TROMETHAMINE 15 MG/ML IJ SOLN
15.0000 mg | Freq: Once | INTRAMUSCULAR | Status: AC
  Administered 2017-08-06: 15 mg via INTRAVENOUS
  Filled 2017-08-06: qty 1

## 2017-08-06 MED ORDER — SODIUM CHLORIDE 0.9 % IV SOLN: INTRAVENOUS | Status: DC

## 2017-08-06 MED ORDER — GABAPENTIN 100 MG PO CAPS: 100.0000 mg | ORAL_CAPSULE | Freq: Two times a day (BID) | ORAL | 0 refills | Status: DC

## 2017-08-06 NOTE — ED Provider Notes (Signed)
[facility] EMERGENCY DEPARTMENT Provider Note   CSN: [id number] Arrival date & time: 08/06/17  1642     History   Chief Complaint No chief complaint on file.   HPI Melissa [name] is a 42 y.o. female with a history of recurrent shingles and vertigo who presents to the ED with complaint of headache that has been persistent for the past 2-3 weeks and syncopal episode yesterday.  Patient states that she has had gradual onset steady progression constant headache since diagnosis of shingles 2-3 weeks ago.  She states it is typical for her to have a headache relating to her shingles, they however typically do not last this long.  States the pain is located in the frontal region.  It is improved minimally following Motrin.  No specific aggravating factors.  She additionally notes over the past few days she has had intermittent nausea and intermittent dizziness described as being off balance.  She states the feeling of off balance is somewhat similar to previous vertigo, however she is not completely sure.  States yesterday she felt very lightheaded and nauseous as if she might pass out while taking a test.  She went home walked up the stairs and when she got her bedroom she believes she had a syncopal episode.  She woke up on her bedroom floor.  She called the emergency department and eventually came to the ED for evaluation.  Patient left yesterday without being seen.  Returns today for evaluation.  Denies change in vision, numbness, weakness, chest pain, or dyspnea.   HPI  Past Medical History:  Diagnosis Date  . Abnormal Pap smear 08/30/2008   LGSIL  . Allergy   . BV (bacterial vaginosis)    recurring  . Chlamydia   . Chronic pain of left knee   . Ectopic pregnancy   . Fibroid   . Infertility    Secondary PCOS  . PCOS (polycystic ovarian syndrome)   . Shingles outbreak 11/2011  . Vertigo     Patient Active Problem List   Diagnosis Date Noted  . Right ankle injury  03/01/2016  . Rash and nonspecific skin eruption 08/11/2015  . Contraceptive management 10/22/2013  . Cold 10/22/2013  . Uterine fibroids, antepartum condition or complication 62/95/2841  . Ruptured ectopic pregnancy 07/05/2011  . Obesity 12/21/2010    Past Surgical History:  Procedure Laterality Date  . DILATION AND CURETTAGE OF UTERUS  06/2011   ectopic  . EAR TUBE REMOVAL    . ECTOPIC PREGNANCY SURGERY    . LAPAROSCOPY  07/05/2011   Procedure: LAPAROSCOPY OPERATIVE;  Surgeon: Osborne Oman, MD;  Location: [facility];  Service: Gynecology;  Laterality: N/A;  . right ear surgery    . WISDOM TOOTH EXTRACTION       OB History    Gravida  5   Para  3   Term  3   Preterm  0   AB  2   Living  3     SAB  1   TAB  0   Ectopic  1   Multiple  0   Live Births  3            Home Medications    Prior to Admission medications   Medication Sig Start Date End Date Taking? Authorizing Provider  acetaminophen (TYLENOL) 500 MG tablet Take 1,000 mg by mouth every 6 (six) hours as needed for mild pain.    [provider]  acyclovir (ZOVIRAX) 800 MG  tablet Take 1 tablet (800 mg total) by mouth 5 (five) times daily. 07/07/17   Doristine Devoid, PA-C  azithromycin (ZITHROMAX) 250 MG tablet Take 1 tablet (250 mg total) by mouth daily. Take first 2 tablets together, then 1 every day until finished. 08/29/16   Larene Pickett, PA-C  benzonatate (TESSALON) 100 MG capsule Take 1 capsule (100 mg total) by mouth every 8 (eight) hours. 06/26/17   Carlisle Cater, PA-C  cyclobenzaprine (FLEXERIL) 10 MG tablet Take 1 tablet (10 mg total) by mouth 2 (two) times daily as needed for muscle spasms. 01/30/17   Long, Wonda Olds, MD  fluticasone Asencion Islam) 50 MCG/ACT nasal spray 1 spray each nares BID 03/26/17   Tanna Furry, MD  ibuprofen (ADVIL,MOTRIN) 800 MG tablet Take 1 tablet (800 mg total) by mouth every 8 (eight) hours as needed. 01/30/17   Long, Wonda Olds, MD  lidocaine (XYLOCAINE) 2 %  solution Use as directed 15 mLs in the mouth or throat as needed for mouth pain. 08/29/16   Larene Pickett, PA-C  loratadine (CLARITIN) 10 MG tablet Take 10 mg by mouth daily.    [provider]  meclizine (ANTIVERT) 25 MG tablet Take 1 tablet (25 mg total) 3 (three) times daily as needed by mouth for dizziness. 03/26/17   Tanna Furry, MD  Multiple Vitamins-Minerals (MULTIVITAMIN WITH MINERALS) tablet Take 1 tablet by mouth daily.    [provider]  naproxen (NAPROSYN) 500 MG tablet Take 1 tablet (500 mg total) by mouth 2 (two) times daily. 05/08/17   Varney Biles, MD  ondansetron (ZOFRAN ODT) 4 MG disintegrating tablet Take 1 tablet (4 mg total) every 8 (eight) hours as needed by mouth for nausea. 03/26/17   Tanna Furry, MD  traMADol (ULTRAM) 50 MG tablet Take 1 tablet (50 mg total) by mouth every 6 (six) hours as needed. 07/07/17   Doristine Devoid, PA-C  diphenhydrAMINE (BENADRYL) 12.5 MG/5ML liquid Take 12.5 mg by mouth once.  08/01/11  [provider]    Family History Family History  Problem Relation Age of Onset  . Diabetes Mother   . Hypertension Mother   . Cancer Father   . Fibroids Sister   . Hypertension Sister   . Crohn's disease Brother   . Cancer Brother   . Diabetes Brother   . Hypertension Brother   . Kidney disease Brother   . Anesthesia problems Neg Hx     Social History Social History   Tobacco Use  . Smoking status: Never Smoker  . Smokeless tobacco: Never Used  Substance Use Topics  . Alcohol use: No  . Drug use: No     Allergies   Artichoke [cynara scolymus (artichoke)]   Review of Systems Review of Systems  Constitutional: Positive for fever (subjective).  HENT: Positive for congestion, ear pain (R), rhinorrhea and tinnitus (intermittent R). Negative for sore throat.   Eyes: Negative for visual disturbance.  Respiratory: Negative for shortness of breath.   Cardiovascular: Negative for chest pain and palpitations.    Gastrointestinal: Positive for nausea. Negative for abdominal pain, blood in stool, constipation, diarrhea and vomiting.  Neurological: Positive for dizziness ("off balance"), syncope, light-headedness and headaches. Negative for weakness and numbness.  All other systems reviewed and are negative.   Physical Exam Updated Vital Signs BP 126/73 (BP Location: Right Arm)   Pulse 75   Temp 98.2 F (36.8 C) (Oral)   Resp 20   Ht 5\' 5"  (1.651 m)   Wt  107.5 kg (237 lb)   LMP 07/20/2017 (Approximate)   SpO2 100%   BMI 39.44 kg/m   Physical Exam  Constitutional: She appears well-developed and well-nourished. No distress.  HENT:  Head: Normocephalic and atraumatic.  Right Ear: Tympanic membrane is erythematous (mild). Tympanic membrane is not retracted and not bulging.  Left Ear: Tympanic membrane is not erythematous, not retracted and not bulging.  Nose: Mucosal edema (congestion) present.  Mouth/Throat: Uvula is midline and oropharynx is clear and moist. No oropharyngeal exudate or posterior oropharyngeal erythema.  Eyes: Pupils are equal, round, and reactive to light. Conjunctivae and EOM are normal. Right eye exhibits no discharge. Left eye exhibits no discharge.  Neck: Normal range of motion. Neck supple. No spinous process tenderness present.  Cardiovascular: Normal rate and regular rhythm.  No murmur heard. Pulmonary/Chest: Breath sounds normal. No respiratory distress. She has no wheezes. She has no rhonchi. She has no rales.  Abdominal: Soft. She exhibits no distension. There is no tenderness. There is no rigidity, no rebound and no guarding.  Lymphadenopathy:    She has no cervical adenopathy.  Neurological: She is alert.  Alert. Clear speech. No facial droop. CNIII-XII are intact. Bilateral upper and lower extremities' sensation grossly intact. 5/5 grip strength bilaterally. 5/5 plantar and dorsi flexion bilaterally. Normal finger to nose bilaterally. Negative pronator drift.  Negative Romberg sign. Gait is steady.   Skin: Skin is warm and dry. No rash noted.  Psychiatric: She has a normal mood and affect. Her behavior is normal.  Nursing note and vitals reviewed.   ED Treatments / Results  Labs Results for orders placed or performed during the hospital encounter of 25/42/70  Basic metabolic panel  Result Value Ref Range   Sodium 139 135 - 145 mmol/L   Potassium 3.5 3.5 - 5.1 mmol/L   Chloride 103 101 - 111 mmol/L   CO2 28 22 - 32 mmol/L   Glucose, Bld 87 65 - 99 mg/dL   BUN 19 6 - 20 mg/dL   Creatinine, Ser 0.71 0.44 - 1.00 mg/dL   Calcium 8.7 (L) 8.9 - 10.3 mg/dL   GFR calc non Af Amer >60 >60 mL/min   GFR calc Af Amer >60 >60 mL/min   Anion gap 8 5 - 15  CBC WITH DIFFERENTIAL  Result Value Ref Range   WBC 4.4 4.0 - 10.5 K/uL   RBC 4.26 3.87 - 5.11 MIL/uL   Hemoglobin 12.2 12.0 - 15.0 g/dL   HCT 37.5 36.0 - 46.0 %   MCV 88.0 78.0 - 100.0 fL   MCH 28.6 26.0 - 34.0 pg   MCHC 32.5 30.0 - 36.0 g/dL   RDW 14.7 11.5 - 15.5 %   Platelets 295 150 - 400 K/uL   Neutrophils Relative % 50 %   Neutro Abs 2.2 1.7 - 7.7 K/uL   Lymphocytes Relative 34 %   Lymphs Abs 1.5 0.7 - 4.0 K/uL   Monocytes Relative 12 %   Monocytes Absolute 0.5 0.1 - 1.0 K/uL   Eosinophils Relative 3 %   Eosinophils Absolute 0.1 0.0 - 0.7 K/uL   Basophils Relative 1 %   Basophils Absolute 0.0 0.0 - 0.1 K/uL  Urinalysis, Routine w reflex microscopic  Result Value Ref Range   Color, Urine YELLOW YELLOW   APPearance CLEAR CLEAR   Specific Gravity, Urine >1.030 (H) 1.005 - 1.030   pH 5.5 5.0 - 8.0   Glucose, UA NEGATIVE NEGATIVE mg/dL   Hgb urine dipstick NEGATIVE NEGATIVE  Bilirubin Urine NEGATIVE NEGATIVE   Ketones, ur NEGATIVE NEGATIVE mg/dL   Protein, ur NEGATIVE NEGATIVE mg/dL   Nitrite NEGATIVE NEGATIVE   Leukocytes, UA NEGATIVE NEGATIVE  Pregnancy, urine  Result Value Ref Range   Preg Test, Ur NEGATIVE NEGATIVE  CBG monitoring, ED  Result Value Ref Range    Glucose-Capillary 74 65 - 99 mg/dL  EKG EKG Interpretation  Date/Time:  Tuesday August 06 2017 17:43:06 EDT Ventricular Rate:  61 PR Interval:    QRS Duration: 90 QT Interval:  381 QTC Calculation: 384 R Axis:   68 Text Interpretation:  Sinus rhythm Confirmed by Tanna Furry [phone]) on 08/06/2017 5:46:05 PM   Radiology No results found.  Procedures Procedures (including critical care time)  Medications Ordered in ED Medications  sodium chloride 0.9 % bolus 1,000 mL (0 mLs Intravenous Stopped 08/06/17 1848)    And  0.9 %  sodium chloride infusion (has no administration in time range)  ketorolac (TORADOL) 15 MG/ML injection 15 mg (15 mg Intravenous Given 08/06/17 1752)  prochlorperazine (COMPAZINE) injection 10 mg (10 mg Intravenous Given 08/06/17 1947)     Initial Impression / Assessment and Plan / ED Course  I have reviewed the triage vital signs and the nursing notes.  Pertinent labs & imaging results that were available during my care of the patient were reviewed by me and considered in my medical decision making (see chart for details).    Patient presents the emergency department with audible complaints today.  She is nontoxic-appearing, in no apparent distress, vitals are without significant abnormality.   -Complaint of headache for the past 2-3 weeks:  Patient has hx of similar headaches, gradual onset with steady progression in severity- non concerning for [facility], ICH, ischemic CVA, acute glaucoma, giant cell arteritis, mass, or meningitis. Pt is afebrile with no focal neuro deficits, change in vision, or nuchal rigidity.  - Complaint of syncope in setting of nausea, URI sxs, and "off balance" feeling, this does not appear specifically associated with headache, given very different time frames-  No associated chest pain or dyspnea. Patient without arrhythmia or tachycardia in the ED.   Work-up initiated with basic labs and EKG. Tx initiated with Toradol and IV fluids.   EKG  with NSR- no obvious ischemia or arrhythmia. Orthostatic vitals unremarkable. Labs reviewed- no significant abnormalities. There is no anemia, no leukocytosis, no significant electrolyte abnormality, no significant abnormality in renal fxn. UA without signs of infection- of note patient urine specific gravity is elevated- suspect this played potential role in syncope and patients overall sxs.  19:00: RE-EVAL: Patient feeling improved following fluids and toradol. Will give compazine and PO challenge.   19:55: RE-EVAL: Patient resting comfortably, states remaining discomfort is very minimal and she feels ready to go home.   Unclear definitive etiology to patient's sxs. She is hemodynamically stable in the emergency department and is safe for DC home with PCP follow up. Will provide prescription for Naproxen for pain, additionally will give prescription for Gabapentin given patient with repetitive episodes of shingles with residual pain, no evidence of shingles at this time on skin exam. I discussed results, treatment plan, need for PCP follow-up, and return precautions with the patient. Provided opportunity for questions, patient confirmed understanding and is in agreement with plan.   Findings and plan of care discussed with supervising physician Dr. Jeneen Rinks who is in agreement with plan.   Final Clinical Impressions(s) / ED Diagnoses   Final diagnoses:  Nonintractable headache, unspecified chronicity pattern,  unspecified headache type    ED Discharge Orders        Ordered    naproxen (NAPROSYN) 500 MG tablet  2 times daily     08/06/17 1959    gabapentin (NEURONTIN) 100 MG capsule  2 times daily     08/06/17 [address] Dorris Carnes 08/06/17 2017    Tanna Furry, MD 08/12/17 2342

## 2017-08-06 NOTE — ED Triage Notes (Signed)
Headache, nausea, sneezing, cough, ringing in her ears x 2 days. States she passed out yesterday. She came here yesterday but left without being seen.

## 2017-08-06 NOTE — Discharge Instructions (Signed)
You were seen in the emergency department today for a headache passing out.  Your lab work and EKG were both reassuring.  Your urine did show that you are somewhat dehydrated.  Pressure to remain her well-hydrated.  You received fluids, anti-inflammatory medication, and antinausea medication in the emergency department with improvement.  Your lab work did not show any significant electrolyte abnormality, anemia, infection, or problems with your kidneys.  We have given you 2 prescriptions today: -naproxen- Naproxen is a nonsteroidal anti-inflammatory medication that will help with pain and swelling. Be sure to take this medication as prescribed with food, 1 pill every 12 hours,  It should be taken with food, as it can cause stomach upset, and more seriously, stomach bleeding. Do not take other nonsteroidal anti-inflammatory medications with this such as Advil, Motrin, or Aleve.   - Gabapentin-this is a medication to help with nerve type pain, take this once in the morning and once in the evening as needed.  We have prescribed you new medication(s) today. Discuss the medications prescribed today with your pharmacist as they can have adverse effects and interactions with your other medicines including over the counter and prescribed medications. Seek medical evaluation if you start to experience new or abnormal symptoms after taking one of these medicines, seek care immediately if you start to experience difficulty breathing, feeling of your throat closing, facial swelling, or rash as these could be indications of a more serious allergic reaction   Follow-up with your primary care doctor in the next 3-5 days for reevaluation of your symptoms.  Return to the emergency department for new or worsening symptoms or any other concerns.

## 2017-09-24 ENCOUNTER — Emergency Department (HOSPITAL_COMMUNITY)
Admission: EM | Admit: 2017-09-24 | Discharge: 2017-09-25 | Disposition: A | Discharge status: Home / Self Care | Payer: Self-pay | Attending: Emergency Medicine | Admitting: Emergency Medicine

## 2017-09-24 ENCOUNTER — Other Ambulatory Visit: Payer: Self-pay

## 2017-09-24 ENCOUNTER — Encounter (HOSPITAL_COMMUNITY): Payer: Self-pay | Admitting: Emergency Medicine

## 2017-09-24 DIAGNOSIS — Z79899 Other long term (current) drug therapy: Secondary | ICD-10-CM | POA: Insufficient documentation

## 2017-09-24 DIAGNOSIS — K529 Noninfective gastroenteritis and colitis, unspecified: Secondary | ICD-10-CM | POA: Insufficient documentation

## 2017-09-24 LAB — URINALYSIS, ROUTINE W REFLEX MICROSCOPIC
BILIRUBIN URINE: NEGATIVE
Glucose, UA: NEGATIVE mg/dL
KETONES UR: NEGATIVE mg/dL
LEUKOCYTES UA: NEGATIVE
Nitrite: NEGATIVE
Protein, ur: NEGATIVE mg/dL
Specific Gravity, Urine: 1.025 (ref 1.005–1.030)
pH: 5 (ref 5.0–8.0)

## 2017-09-24 LAB — COMPREHENSIVE METABOLIC PANEL
ALT: 14 U/L (ref 14–54)
ANION GAP: 10 (ref 5–15)
AST: 19 U/L (ref 15–41)
Albumin: 3.9 g/dL (ref 3.5–5.0)
Alkaline Phosphatase: 48 U/L (ref 38–126)
BILIRUBIN TOTAL: 0.9 mg/dL (ref 0.3–1.2)
BUN: 15 mg/dL (ref 6–20)
CHLORIDE: 105 mmol/L (ref 101–111)
CO2: 24 mmol/L (ref 22–32)
Calcium: 9.1 mg/dL (ref 8.9–10.3)
Creatinine, Ser: 0.94 mg/dL (ref 0.44–1.00)
Glucose, Bld: 107 mg/dL — ABNORMAL HIGH (ref 65–99)
Potassium: 4 mmol/L (ref 3.5–5.1)
Sodium: 139 mmol/L (ref 135–145)
TOTAL PROTEIN: 7.5 g/dL (ref 6.5–8.1)

## 2017-09-24 LAB — LIPASE, BLOOD: LIPASE: 29 U/L (ref 11–51)

## 2017-09-24 LAB — CBC
HEMATOCRIT: 43.4 % (ref 36.0–46.0)
HEMOGLOBIN: 13.6 g/dL (ref 12.0–15.0)
MCH: 27.9 pg (ref 26.0–34.0)
MCHC: 31.3 g/dL (ref 30.0–36.0)
MCV: 89.1 fL (ref 78.0–100.0)
Platelets: 286 10*3/uL (ref 150–400)
RBC: 4.87 MIL/uL (ref 3.87–5.11)
RDW: 14.2 % (ref 11.5–15.5)
WBC: 5.7 10*3/uL (ref 4.0–10.5)

## 2017-09-24 LAB — I-STAT BETA HCG BLOOD, ED (MC, WL, AP ONLY)

## 2017-09-24 MED ORDER — FENTANYL CITRATE (PF) 100 MCG/2ML IJ SOLN
50.0000 ug | INTRAMUSCULAR | Status: DC | PRN
  Administered 2017-09-24: 50 ug via NASAL

## 2017-09-24 MED ORDER — FENTANYL CITRATE (PF) 100 MCG/2ML IJ SOLN
INTRAMUSCULAR | Status: AC
  Administered 2017-09-24: 50 ug via NASAL
  Filled 2017-09-24: qty 2

## 2017-09-24 NOTE — ED Triage Notes (Signed)
Patient reports mid/low abdominal pain radiating to lower back onset this morning with emesis and diarrhea . Denies fever or chills .

## 2017-09-25 ENCOUNTER — Emergency Department (HOSPITAL_COMMUNITY): Payer: Self-pay

## 2017-09-25 MED ORDER — SODIUM CHLORIDE 0.9 % IV BOLUS
1000.0000 mL | Freq: Once | INTRAVENOUS | Status: AC
  Administered 2017-09-25: 1000 mL via INTRAVENOUS

## 2017-09-25 MED ORDER — ONDANSETRON HCL 4 MG/2ML IJ SOLN
4.0000 mg | Freq: Once | INTRAMUSCULAR | Status: AC
  Administered 2017-09-25: 4 mg via INTRAVENOUS
  Filled 2017-09-25: qty 2

## 2017-09-25 MED ORDER — MORPHINE SULFATE (PF) 4 MG/ML IV SOLN
4.0000 mg | Freq: Once | INTRAVENOUS | Status: AC
  Administered 2017-09-25: 4 mg via INTRAVENOUS
  Filled 2017-09-25: qty 1

## 2017-09-25 MED ORDER — KETOROLAC TROMETHAMINE 15 MG/ML IJ SOLN
15.0000 mg | Freq: Once | INTRAMUSCULAR | Status: AC
  Administered 2017-09-25: 15 mg via INTRAVENOUS
  Filled 2017-09-25: qty 1

## 2017-09-25 MED ORDER — ONDANSETRON 4 MG PO TBDP: 4.0000 mg | ORAL_TABLET | Freq: Three times a day (TID) | ORAL | 0 refills | Status: DC | PRN

## 2017-09-25 MED ORDER — LOPERAMIDE HCL 2 MG PO CAPS: 2.0000 mg | ORAL_CAPSULE | Freq: Four times a day (QID) | ORAL | 0 refills | Status: DC | PRN

## 2017-09-25 NOTE — ED Notes (Signed)
Patient transported to CT 

## 2017-09-25 NOTE — ED Notes (Signed)
Ginger ale given to patient. 

## 2017-09-25 NOTE — ED Provider Notes (Signed)
[facility]ER[facility] DEPARTMENT Provider Note   CSN: [id number] Arrival date & time: 09/24/17  2106     History   Chief Complaint Chief Complaint  Patient presents with  . Abdominal Pain    HPI Jatziry Wechter is a 42 y.o. female.  HPI  This is a 42 year old female with a history of ectopic pregnancy, PCOS who presents with abdominal pain, vomiting.  Patient reports acute onset of abdominal pain earlier today.  She describes it as sharp and nonradiating.  It is mostly over the right side of her abdomen.  She also reports some back pain.  She states she feels very uncomfortable.  Nothing seems to make the pain better or worse.  She has had multiple episodes of nonbilious, nonbloody emesis.  She also reports nonbloody diarrhea.  No known sick contacts.  Denies fevers.  Denies hematuria or dysuria.  She currently rates her pain at 10 out of 10.  Past Medical History:  Diagnosis Date  . Abnormal Pap smear 08/30/2008   LGSIL  . Allergy   . BV (bacterial vaginosis)    recurring  . Chlamydia   . Chronic pain of left knee   . Ectopic pregnancy   . Fibroid   . Infertility    Secondary PCOS  . PCOS (polycystic ovarian syndrome)   . Shingles outbreak 11/2011  . Vertigo     Patient Active Problem List   Diagnosis Date Noted  . Right ankle injury 03/01/2016  . Rash and nonspecific skin eruption 08/11/2015  . Contraceptive management 10/22/2013  . Cold 10/22/2013  . Uterine fibroids, antepartum condition or complication 02/63/7858  . Ruptured ectopic pregnancy 07/05/2011  . Obesity 12/21/2010    Past Surgical History:  Procedure Laterality Date  . DILATION AND CURETTAGE OF UTERUS  06/2011   ectopic  . EAR TUBE REMOVAL    . ECTOPIC PREGNANCY SURGERY    . LAPAROSCOPY  07/05/2011   Procedure: LAPAROSCOPY OPERATIVE;  Surgeon: Osborne Oman, MD;  Location: [facility];  Service: Gynecology;  Laterality: N/A;  . right ear surgery    . WISDOM TOOTH EXTRACTION         OB History    Gravida  5   Para  3   Term  3   Preterm  0   AB  2   Living  3     SAB  1   TAB  0   Ectopic  1   Multiple  0   Live Births  3            Home Medications    Prior to Admission medications   Medication Sig Start Date End Date Taking? Authorizing Provider  acetaminophen (TYLENOL) 500 MG tablet Take 1,000 mg by mouth every 6 (six) hours as needed for mild pain.    [provider]  acyclovir (ZOVIRAX) 800 MG tablet Take 1 tablet (800 mg total) by mouth 5 (five) times daily. 07/07/17   Doristine Devoid, PA-C  azithromycin (ZITHROMAX) 250 MG tablet Take 1 tablet (250 mg total) by mouth daily. Take first 2 tablets together, then 1 every day until finished. 08/29/16   Larene Pickett, PA-C  benzonatate (TESSALON) 100 MG capsule Take 1 capsule (100 mg total) by mouth every 8 (eight) hours. 06/26/17   Carlisle Cater, PA-C  cyclobenzaprine (FLEXERIL) 10 MG tablet Take 1 tablet (10 mg total) by mouth 2 (two) times daily as needed for muscle spasms. 01/30/17   Long,  Wonda Olds, MD  fluticasone Asencion Islam) 50 MCG/ACT nasal spray 1 spray each nares BID 03/26/17   Tanna Furry, MD  gabapentin (NEURONTIN) 100 MG capsule Take 1 capsule (100 mg total) by mouth 2 (two) times daily. 08/06/17   Petrucelli, Samantha R, PA-C  ibuprofen (ADVIL,MOTRIN) 800 MG tablet Take 1 tablet (800 mg total) by mouth every 8 (eight) hours as needed. 01/30/17   Long, Wonda Olds, MD  lidocaine (XYLOCAINE) 2 % solution Use as directed 15 mLs in the mouth or throat as needed for mouth pain. 08/29/16   Larene Pickett, PA-C  loperamide (IMODIUM) 2 MG capsule Take 1 capsule (2 mg total) by mouth 4 (four) times daily as needed for diarrhea or loose stools. 09/25/17   Ahley Bulls, Barbette Hair, MD  loratadine (CLARITIN) 10 MG tablet Take 10 mg by mouth daily.    [provider]  meclizine (ANTIVERT) 25 MG tablet Take 1 tablet (25 mg total) 3 (three) times daily as needed by mouth for dizziness.  03/26/17   Tanna Furry, MD  Multiple Vitamins-Minerals (MULTIVITAMIN WITH MINERALS) tablet Take 1 tablet by mouth daily.    [provider]  naproxen (NAPROSYN) 500 MG tablet Take 1 tablet (500 mg total) by mouth 2 (two) times daily. 08/06/17   Petrucelli, Samantha R, PA-C  ondansetron (ZOFRAN ODT) 4 MG disintegrating tablet Take 1 tablet (4 mg total) by mouth every 8 (eight) hours as needed for nausea or vomiting. 09/25/17   Antonique Langford, Barbette Hair, MD  traMADol (ULTRAM) 50 MG tablet Take 1 tablet (50 mg total) by mouth every 6 (six) hours as needed. 07/07/17   Doristine Devoid, PA-C  diphenhydrAMINE (BENADRYL) 12.5 MG/5ML liquid Take 12.5 mg by mouth once.  08/01/11  [provider]    Family History Family History  Problem Relation Age of Onset  . Diabetes Mother   . Hypertension Mother   . Cancer Father   . Fibroids Sister   . Hypertension Sister   . Crohn's disease Brother   . Cancer Brother   . Diabetes Brother   . Hypertension Brother   . Kidney disease Brother   . Anesthesia problems Neg Hx     Social History Social History   Tobacco Use  . Smoking status: Never Smoker  . Smokeless tobacco: Never Used  Substance Use Topics  . Alcohol use: No  . Drug use: No     Allergies   Artichoke [cynara scolymus (artichoke)]   Review of Systems Review of Systems  Constitutional: Negative for fever.  Respiratory: Negative for shortness of breath.   Cardiovascular: Negative for chest pain.  Gastrointestinal: Positive for abdominal pain, diarrhea, nausea and vomiting.  Genitourinary: Negative for dysuria, flank pain and hematuria.  Neurological: Negative for dizziness.  All other systems reviewed and are negative.    Physical Exam Updated Vital Signs BP 131/65   Pulse 89   Temp 99.3 F (37.4 C) (Oral)   Resp (!) 21   LMP 09/10/2017 (Approximate)   SpO2 100%   Physical Exam  Constitutional: She is oriented to person, place, and time. She appears  well-developed and well-nourished.  Uncomfortable appearing but nontoxic  HENT:  Head: Normocephalic and atraumatic.  Neck: Neck supple.  Cardiovascular: Normal rate, regular rhythm and normal heart sounds.  Pulmonary/Chest: Effort normal. No respiratory distress. She has no wheezes.  Abdominal: Soft. Bowel sounds are normal. There is tenderness in the right lower quadrant. There is no rebound, no guarding and no CVA tenderness.  Neurological: She is alert and oriented to person, place, and time.  Skin: Skin is warm and dry.  Psychiatric: She has a normal mood and affect.  Nursing note and vitals reviewed.    ED Treatments / Results  Labs (all labs ordered are listed, but only abnormal results are displayed) Labs Reviewed  COMPREHENSIVE METABOLIC PANEL - Abnormal; Notable for the following components:      Result Value   Glucose, Bld 107 (*)    All other components within normal limits  URINALYSIS, ROUTINE W REFLEX MICROSCOPIC - Abnormal; Notable for the following components:   APPearance HAZY (*)    Hgb urine dipstick SMALL (*)    Bacteria, UA RARE (*)    All other components within normal limits  LIPASE, BLOOD  CBC  I-STAT BETA HCG BLOOD, ED ([facility], [facility], [facility] ONLY)    EKG None  Radiology Ct Renal Stone Study  Result Date: 09/25/2017 CLINICAL DATA:  Nausea vomiting and diarrhea EXAM: CT ABDOMEN AND PELVIS WITHOUT CONTRAST TECHNIQUE: Multidetector CT imaging of the abdomen and pelvis was performed following the standard protocol without IV contrast. COMPARISON:  01/30/2017 FINDINGS: Lower chest: Lung bases demonstrate no acute consolidation or pleural effusion. Normal heart size. Hepatobiliary: No focal liver abnormality is seen. No gallstones, gallbladder wall thickening, or biliary dilatation. Pancreas: Unremarkable. No pancreatic ductal dilatation or surrounding inflammatory changes. Spleen: Normal in size without focal abnormality. Adrenals/Urinary Tract: Adrenal glands are  unremarkable. Kidneys are normal, without renal calculi, focal lesion, or hydronephrosis. Bladder is unremarkable. Stomach/Bowel: Stomach is nonenlarged. Diffuse fluid in the small and large bowel without bowel dilatation. No bowel wall thickening. Radiopaque material in the colon. Apparent blind-ending tubular structure in the right lower quadrant, appears to be contiguous with cecum and measures up to 14 mm. No surrounding inflammation. Similar appearance compared to the 2018 study. Vascular/Lymphatic: No significant vascular findings are present. No enlarged abdominal or pelvic lymph nodes. Reproductive: Uterus and bilateral adnexa are unremarkable. Other: Negative for free air or free fluid. Musculoskeletal: Ankylosis of the lower thoracic spine. IMPRESSION: 1. No CT evidence for acute intra-abdominal or pelvic abnormality. Diffuse fluid-filled small and large bowel, possible enteritis 2. Apparent blind-ending tubular structure in the right lower quadrant, contiguous with cecum and measuring up 14 mm but no inflammation, this may represent a mucocele. Electronically Signed   By: Donavan Foil M.D.   On: 09/25/2017 02:37    Procedures Procedures (including critical care time)  Medications Ordered in ED Medications  fentaNYL (SUBLIMAZE) injection 50 mcg (50 mcg Nasal Given 09/24/17 2137)  sodium chloride 0.9 % bolus 1,000 mL (0 mLs Intravenous Stopped 09/25/17 0253)  ketorolac (TORADOL) 15 MG/ML injection 15 mg (15 mg Intravenous Given 09/25/17 0132)  ondansetron (ZOFRAN) injection 4 mg (4 mg Intravenous Given 09/25/17 0133)  morphine 4 MG/ML injection 4 mg (4 mg Intravenous Given 09/25/17 0132)     Initial Impression / Assessment and Plan / ED Course  I have reviewed the triage vital signs and the nursing notes.  Pertinent labs & imaging results that were available during my care of the patient were reviewed by me and considered in my medical decision making (see chart for details).     Patient  presents with abdominal pain, vomiting and diarrhea.  No known sick contacts.  She is overall nontoxic-appearing and vital signs are reassuring.  She does appear uncomfortable.  She has no significant reducible pain on exam.  Considerations include gastroenteritis, kidney stone with atypical features, less likely  appendicitis given acute onset of symptoms.  Patient was given fluids, nausea, pain medication.  Urine does have small hemoglobin.  Pain seems out of proportion for simple gastroenteritis.  For this reason, CT scan was obtained to evaluate for kidney stone.  CT scan is largely unremarkable with exception of some fluid levels suggestive of enteritis.  On recheck, patient states she feels much better.  Lab work is unremarkable.  She is able to tolerate fluids.  Suspect viral gastroenteritis.  Will treat symptomatically with Zofran and Imodium.  After history, exam, and medical workup I feel the patient has been appropriately medically screened and is safe for discharge home. Pertinent diagnoses were discussed with the patient. Patient was given return precautions.  Final Clinical Impressions(s) / ED Diagnoses   Final diagnoses:  Gastroenteritis    ED Discharge Orders        Ordered    ondansetron (ZOFRAN ODT) 4 MG disintegrating tablet  Every 8 hours PRN     09/25/17 0311    loperamide (IMODIUM) 2 MG capsule  4 times daily PRN     09/25/17 0311       Merryl Hacker, MD 09/25/17 [phone]

## 2017-10-06 ENCOUNTER — Other Ambulatory Visit: Payer: Self-pay

## 2017-10-06 ENCOUNTER — Emergency Department (HOSPITAL_BASED_OUTPATIENT_CLINIC_OR_DEPARTMENT_OTHER)
Admission: EM | Admit: 2017-10-06 | Discharge: 2017-10-06 | Disposition: A | Discharge status: Home / Self Care | Payer: Self-pay | Attending: Emergency Medicine | Admitting: Emergency Medicine

## 2017-10-06 ENCOUNTER — Encounter (HOSPITAL_BASED_OUTPATIENT_CLINIC_OR_DEPARTMENT_OTHER): Payer: Self-pay | Admitting: Emergency Medicine

## 2017-10-06 DIAGNOSIS — Z79899 Other long term (current) drug therapy: Secondary | ICD-10-CM | POA: Insufficient documentation

## 2017-10-06 DIAGNOSIS — K0889 Other specified disorders of teeth and supporting structures: Secondary | ICD-10-CM | POA: Insufficient documentation

## 2017-10-06 MED ORDER — NAPROXEN 500 MG PO TABS: 500.0000 mg | ORAL_TABLET | Freq: Two times a day (BID) | ORAL | 0 refills | Status: DC

## 2017-10-06 MED ORDER — PENICILLIN V POTASSIUM 500 MG PO TABS: 500.0000 mg | ORAL_TABLET | Freq: Four times a day (QID) | ORAL | 0 refills | Status: AC

## 2017-10-06 MED ORDER — NAPROXEN 250 MG PO TABS
500.0000 mg | ORAL_TABLET | Freq: Once | ORAL | Status: AC
  Administered 2017-10-06: 500 mg via ORAL
  Filled 2017-10-06: qty 2

## 2017-10-06 MED ORDER — ACETAMINOPHEN 500 MG PO TABS: 500.0000 mg | ORAL_TABLET | Freq: Four times a day (QID) | ORAL | 0 refills | Status: DC | PRN

## 2017-10-06 MED ORDER — PENICILLIN V POTASSIUM 250 MG PO TABS
500.0000 mg | ORAL_TABLET | Freq: Once | ORAL | Status: AC
  Administered 2017-10-06: 500 mg via ORAL
  Filled 2017-10-06: qty 2

## 2017-10-06 NOTE — ED Notes (Signed)
Pt given d/c instructions as per chart. Rx x 2. Verbalizes understanding. No questions. 

## 2017-10-06 NOTE — ED Notes (Signed)
Pt c/o pain to left lower jaw. Mol;ars sensitive to air, cold and heat. Unable to chew on that side.

## 2017-10-06 NOTE — ED Triage Notes (Signed)
PT presents with c/o left lower dental pain that started today

## 2017-10-06 NOTE — Discharge Instructions (Addendum)
Medications: Penicillin, Naprosyn, Tylenol  Treatment: Take penicillin until completed.  Take Naprosyn twice daily.  You can alternate with Tylenol as prescribed.  Follow-up: Please follow-up with a dentist as soon as possible.  Attached are several dental resources.  Please return to the emergency department if you develop any new or worsening symptoms including masses in your neck, fever, or any other new concerning symptom.

## 2017-10-07 NOTE — ED Provider Notes (Signed)
[facility] EMERGENCY DEPARTMENT Provider Note   CSN: [id number] Arrival date & time: 10/06/17  2048     History   Chief Complaint Chief Complaint  Patient presents with  . Dental Pain    HPI Melissa [name] is a 42 y.o. female with history of PCOS who presents with dental pain for the past 1 day.  She reports she believes her tooth may be cracked near the filling.  She has pain to the back to upper and lower molars on the left.  She denies any fevers.  She has had some pain rating to her left ear.  She denies any significant neck pain.  She is not taking any medications at home for symptoms.  She does have a dentist, but does not currently have any insurance.  HPI  Past Medical History:  Diagnosis Date  . Abnormal Pap smear 08/30/2008   LGSIL  . Allergy   . BV (bacterial vaginosis)    recurring  . Chlamydia   . Chronic pain of left knee   . Ectopic pregnancy   . Fibroid   . Infertility    Secondary PCOS  . PCOS (polycystic ovarian syndrome)   . Shingles outbreak 11/2011  . Vertigo     Patient Active Problem List   Diagnosis Date Noted  . Right ankle injury 03/01/2016  . Rash and nonspecific skin eruption 08/11/2015  . Contraceptive management 10/22/2013  . Cold 10/22/2013  . Uterine fibroids, antepartum condition or complication 02/72/5366  . Ruptured ectopic pregnancy 07/05/2011  . Obesity 12/21/2010    Past Surgical History:  Procedure Laterality Date  . DILATION AND CURETTAGE OF UTERUS  06/2011   ectopic  . EAR TUBE REMOVAL    . ECTOPIC PREGNANCY SURGERY    . LAPAROSCOPY  07/05/2011   Procedure: LAPAROSCOPY OPERATIVE;  Surgeon: Osborne Oman, MD;  Location: [facility];  Service: Gynecology;  Laterality: N/A;  . right ear surgery    . WISDOM TOOTH EXTRACTION       OB History    Gravida  5   Para  3   Term  3   Preterm  0   AB  2   Living  3     SAB  1   TAB  0   Ectopic  1   Multiple  0   Live Births  3             Home Medications    Prior to Admission medications   Medication Sig Start Date End Date Taking? Authorizing Provider  acetaminophen (TYLENOL) 500 MG tablet Take 1 tablet (500 mg total) by mouth every 6 (six) hours as needed. 10/06/17   Franko Hilliker, Bea Graff, PA-C  acyclovir (ZOVIRAX) 800 MG tablet Take 1 tablet (800 mg total) by mouth 5 (five) times daily. 07/07/17   Doristine Devoid, PA-C  azithromycin (ZITHROMAX) 250 MG tablet Take 1 tablet (250 mg total) by mouth daily. Take first 2 tablets together, then 1 every day until finished. 08/29/16   Larene Pickett, PA-C  benzonatate (TESSALON) 100 MG capsule Take 1 capsule (100 mg total) by mouth every 8 (eight) hours. 06/26/17   Carlisle Cater, PA-C  cyclobenzaprine (FLEXERIL) 10 MG tablet Take 1 tablet (10 mg total) by mouth 2 (two) times daily as needed for muscle spasms. 01/30/17   Long, Wonda Olds, MD  fluticasone Asencion Islam) 50 MCG/ACT nasal spray 1 spray each nares BID 03/26/17   Tanna Furry, MD  gabapentin (NEURONTIN)  100 MG capsule Take 1 capsule (100 mg total) by mouth 2 (two) times daily. 08/06/17   Petrucelli, Samantha R, PA-C  lidocaine (XYLOCAINE) 2 % solution Use as directed 15 mLs in the mouth or throat as needed for mouth pain. 08/29/16   Larene Pickett, PA-C  loperamide (IMODIUM) 2 MG capsule Take 1 capsule (2 mg total) by mouth 4 (four) times daily as needed for diarrhea or loose stools. 09/25/17   Horton, Barbette Hair, MD  loratadine (CLARITIN) 10 MG tablet Take 10 mg by mouth daily.    [provider]  meclizine (ANTIVERT) 25 MG tablet Take 1 tablet (25 mg total) 3 (three) times daily as needed by mouth for dizziness. 03/26/17   Tanna Furry, MD  Multiple Vitamins-Minerals (MULTIVITAMIN WITH MINERALS) tablet Take 1 tablet by mouth daily.    [provider]  naproxen (NAPROSYN) 500 MG tablet Take 1 tablet (500 mg total) by mouth 2 (two) times daily. 10/06/17   Ayaan Shutes, Bea Graff, PA-C  ondansetron (ZOFRAN ODT) 4 MG  disintegrating tablet Take 1 tablet (4 mg total) by mouth every 8 (eight) hours as needed for nausea or vomiting. 09/25/17   Horton, Barbette Hair, MD  penicillin v potassium (VEETID) 500 MG tablet Take 1 tablet (500 mg total) by mouth 4 (four) times daily for 7 days. 10/06/17 10/13/17  Frederica Kuster, PA-C  traMADol (ULTRAM) 50 MG tablet Take 1 tablet (50 mg total) by mouth every 6 (six) hours as needed. 07/07/17   Doristine Devoid, PA-C  diphenhydrAMINE (BENADRYL) 12.5 MG/5ML liquid Take 12.5 mg by mouth once.  08/01/11  [provider]    Family History Family History  Problem Relation Age of Onset  . Diabetes Mother   . Hypertension Mother   . Cancer Father   . Fibroids Sister   . Hypertension Sister   . Crohn's disease Brother   . Cancer Brother   . Diabetes Brother   . Hypertension Brother   . Kidney disease Brother   . Anesthesia problems Neg Hx     Social History Social History   Tobacco Use  . Smoking status: Never Smoker  . Smokeless tobacco: Never Used  Substance Use Topics  . Alcohol use: No  . Drug use: No     Allergies   Artichoke [cynara scolymus (artichoke)]   Review of Systems Review of Systems  Constitutional: Negative for fever.  HENT: Positive for dental problem and ear pain.      Physical Exam Updated Vital Signs BP 131/82 (BP Location: Right Arm)   Pulse (!) 58   Temp 98.2 F (36.8 C) (Oral)   Resp 18   Ht 5\' 3"  (1.6 m)   Wt 109.3 kg (241 lb)   LMP 09/10/2017 (Approximate)   SpO2 100%   BMI 42.69 kg/m   Physical Exam  Constitutional: She appears well-developed and well-nourished. No distress.  HENT:  Head: Normocephalic and atraumatic.  Right Ear: Tympanic membrane normal.  Left Ear: Tympanic membrane is erythematous (mild).  Mouth/Throat: Oropharynx is clear and moist. No oropharyngeal exudate, posterior oropharyngeal edema, posterior oropharyngeal erythema or tonsillar abscesses.    Tenderness as indicated, no abscess,  no significant erythema or gingival edema No submandibular tenderness or masses  Eyes: Pupils are equal, round, and reactive to light. Conjunctivae are normal. Right eye exhibits no discharge. Left eye exhibits no discharge. No scleral icterus.  Neck: Normal range of motion. Neck supple. No thyromegaly present.  Cardiovascular: Normal rate, regular rhythm,  normal heart sounds and intact distal pulses. Exam reveals no gallop and no friction rub.  No murmur heard. Pulmonary/Chest: Effort normal and breath sounds normal. No stridor. No respiratory distress. She has no wheezes. She has no rales.  Musculoskeletal: She exhibits no edema.  Lymphadenopathy:    She has no cervical adenopathy.  Neurological: She is alert. Coordination normal.  Skin: Skin is warm and dry. No rash noted. She is not diaphoretic. No pallor.  Psychiatric: She has a normal mood and affect.  Nursing note and vitals reviewed.    ED Treatments / Results  Labs (all labs ordered are listed, but only abnormal results are displayed) Labs Reviewed - No data to display  EKG None  Radiology No results found.  Procedures Procedures (including critical care time)  Medications Ordered in ED Medications  penicillin v potassium (VEETID) tablet 500 mg (500 mg Oral Given 10/06/17 2302)  naproxen (NAPROSYN) tablet 500 mg (500 mg Oral Given 10/06/17 2303)     Initial Impression / Assessment and Plan / ED Course  I have reviewed the triage vital signs and the nursing notes.  Pertinent labs & imaging results that were available during my care of the patient were reviewed by me and considered in my medical decision making (see chart for details).     Patient with dentalgia.  No abscess requiring immediate incision and drainage.  Exam not concerning for Ludwig's angina or pharyngeal abscess.  Will treat with penicillin, Naprosyn, Tylenol. Pt instructed to follow-up with dentist.  Patient given dental resources.  Discussed return  precautions.  Patient understands and agrees with plan.  Patient vitals stable throughout ED course and discharged in satisfactory condition.  Final Clinical Impressions(s) / ED Diagnoses   Final diagnoses:  Pain, dental    ED Discharge Orders        Ordered    penicillin v potassium (VEETID) 500 MG tablet  4 times daily     10/06/17 2257    naproxen (NAPROSYN) 500 MG tablet  2 times daily     10/06/17 2257    acetaminophen (TYLENOL) 500 MG tablet  Every 6 hours PRN     10/06/17 2257       Frederica Kuster, PA-C 10/07/17 0041    Charlesetta Shanks, MD 10/15/17 1453

## 2017-12-14 ENCOUNTER — Encounter (HOSPITAL_BASED_OUTPATIENT_CLINIC_OR_DEPARTMENT_OTHER): Payer: Self-pay | Admitting: Emergency Medicine

## 2017-12-14 ENCOUNTER — Other Ambulatory Visit: Payer: Self-pay

## 2017-12-14 ENCOUNTER — Emergency Department (HOSPITAL_BASED_OUTPATIENT_CLINIC_OR_DEPARTMENT_OTHER)
Admission: EM | Admit: 2017-12-14 | Discharge: 2017-12-14 | Disposition: A | Discharge status: Home / Self Care | Payer: Medicaid Other | Attending: Emergency Medicine | Admitting: Emergency Medicine

## 2017-12-14 DIAGNOSIS — K0889 Other specified disorders of teeth and supporting structures: Secondary | ICD-10-CM | POA: Diagnosis present

## 2017-12-14 DIAGNOSIS — Z79899 Other long term (current) drug therapy: Secondary | ICD-10-CM | POA: Insufficient documentation

## 2017-12-14 DIAGNOSIS — K029 Dental caries, unspecified: Secondary | ICD-10-CM | POA: Diagnosis not present

## 2017-12-14 DIAGNOSIS — K047 Periapical abscess without sinus: Secondary | ICD-10-CM | POA: Diagnosis not present

## 2017-12-14 MED ORDER — AMOXICILLIN-POT CLAVULANATE 875-125 MG PO TABS
ORAL_TABLET | ORAL | Status: AC
  Filled 2017-12-14: qty 1

## 2017-12-14 MED ORDER — IBUPROFEN 800 MG PO TABS
800.0000 mg | ORAL_TABLET | Freq: Once | ORAL | Status: AC
  Administered 2017-12-14: 800 mg via ORAL
  Filled 2017-12-14: qty 1

## 2017-12-14 MED ORDER — AMOXICILLIN-POT CLAVULANATE 875-125 MG PO TABS
1.0000 | ORAL_TABLET | Freq: Once | ORAL | Status: AC
  Administered 2017-12-14: 1 via ORAL
  Filled 2017-12-14: qty 1

## 2017-12-14 MED ORDER — AMOXICILLIN-POT CLAVULANATE 875-125 MG PO TABS: 1.0000 | ORAL_TABLET | Freq: Two times a day (BID) | ORAL | 0 refills | Status: DC

## 2017-12-14 MED ORDER — TRAMADOL HCL 50 MG PO TABS: 50.0000 mg | ORAL_TABLET | Freq: Four times a day (QID) | ORAL | 0 refills | Status: DC | PRN

## 2017-12-14 NOTE — ED Provider Notes (Signed)
[place] EMERGENCY DEPARTMENT Provider Note   CSN: [id number] Arrival date & time: 12/14/17  1617     History   Chief Complaint Chief Complaint  Patient presents with  . Dental Pain    HPI Melissa [name] is a 42 y.o. female hx of BV, fibroids, here with dental pain.  Patient states that she has been having recurrent dental issues since she has no dental insurance.  She states that her left lower molar has been giving her problems.  He has been having left jaw pain for the last 2 months and was seen in the ED in May this year and was given a course of penicillin and pain medicines.  She states that for the last several days, she noticed worsening left jaw pain and swelling.  She noticed some discharge from the left lower molar and some sore throat.  She states that she is nauseated but denies any vomiting.  She denies any fevers.   The history is provided by the patient.    Past Medical History:  Diagnosis Date  . Abnormal Pap smear 08/30/2008   LGSIL  . Allergy   . BV (bacterial vaginosis)    recurring  . Chlamydia   . Chronic pain of left knee   . Ectopic pregnancy   . Fibroid   . Infertility    Secondary PCOS  . PCOS (polycystic ovarian syndrome)   . Shingles outbreak 11/2011  . Vertigo     Patient Active Problem List   Diagnosis Date Noted  . Right ankle injury 03/01/2016  . Rash and nonspecific skin eruption 08/11/2015  . Contraceptive management 10/22/2013  . Cold 10/22/2013  . Uterine fibroids, antepartum condition or complication 95/18/8416  . Ruptured ectopic pregnancy 07/05/2011  . Obesity 12/21/2010    Past Surgical History:  Procedure Laterality Date  . DILATION AND CURETTAGE OF UTERUS  06/2011   ectopic  . EAR TUBE REMOVAL    . ECTOPIC PREGNANCY SURGERY    . LAPAROSCOPY  07/05/2011   Procedure: LAPAROSCOPY OPERATIVE;  Surgeon: Osborne Oman, MD;  Location: [facility];  Service: Gynecology;  Laterality: N/A;  . right ear surgery    .  WISDOM TOOTH EXTRACTION       OB History    Gravida  5   Para  3   Term  3   Preterm  0   AB  2   Living  3     SAB  1   TAB  0   Ectopic  1   Multiple  0   Live Births  3            Home Medications    Prior to Admission medications   Medication Sig Start Date End Date Taking? Authorizing Provider  acetaminophen (TYLENOL) 500 MG tablet Take 1 tablet (500 mg total) by mouth every 6 (six) hours as needed. 10/06/17   Law, Bea Graff, PA-C  acyclovir (ZOVIRAX) 800 MG tablet Take 1 tablet (800 mg total) by mouth 5 (five) times daily. 07/07/17   Doristine Devoid, PA-C  azithromycin (ZITHROMAX) 250 MG tablet Take 1 tablet (250 mg total) by mouth daily. Take first 2 tablets together, then 1 every day until finished. 08/29/16   Larene Pickett, PA-C  benzonatate (TESSALON) 100 MG capsule Take 1 capsule (100 mg total) by mouth every 8 (eight) hours. 06/26/17   Carlisle Cater, PA-C  cyclobenzaprine (FLEXERIL) 10 MG tablet Take 1 tablet (10 mg total) by  mouth 2 (two) times daily as needed for muscle spasms. 01/30/17   Long, Wonda Olds, MD  fluticasone Asencion Islam) 50 MCG/ACT nasal spray 1 spray each nares BID 03/26/17   Tanna Furry, MD  gabapentin (NEURONTIN) 100 MG capsule Take 1 capsule (100 mg total) by mouth 2 (two) times daily. 08/06/17   Petrucelli, Samantha R, PA-C  lidocaine (XYLOCAINE) 2 % solution Use as directed 15 mLs in the mouth or throat as needed for mouth pain. 08/29/16   Larene Pickett, PA-C  loperamide (IMODIUM) 2 MG capsule Take 1 capsule (2 mg total) by mouth 4 (four) times daily as needed for diarrhea or loose stools. 09/25/17   Horton, Barbette Hair, MD  loratadine (CLARITIN) 10 MG tablet Take 10 mg by mouth daily.    [provider]  meclizine (ANTIVERT) 25 MG tablet Take 1 tablet (25 mg total) 3 (three) times daily as needed by mouth for dizziness. 03/26/17   Tanna Furry, MD  Multiple Vitamins-Minerals (MULTIVITAMIN WITH MINERALS) tablet Take 1 tablet by  mouth daily.    [provider]  naproxen (NAPROSYN) 500 MG tablet Take 1 tablet (500 mg total) by mouth 2 (two) times daily. 10/06/17   Law, Bea Graff, PA-C  ondansetron (ZOFRAN ODT) 4 MG disintegrating tablet Take 1 tablet (4 mg total) by mouth every 8 (eight) hours as needed for nausea or vomiting. 09/25/17   Horton, Barbette Hair, MD  traMADol (ULTRAM) 50 MG tablet Take 1 tablet (50 mg total) by mouth every 6 (six) hours as needed. 07/07/17   Doristine Devoid, PA-C  diphenhydrAMINE (BENADRYL) 12.5 MG/5ML liquid Take 12.5 mg by mouth once.  08/01/11  [provider]    Family History Family History  Problem Relation Age of Onset  . Diabetes Mother   . Hypertension Mother   . Cancer Father   . Fibroids Sister   . Hypertension Sister   . Crohn's disease Brother   . Cancer Brother   . Diabetes Brother   . Hypertension Brother   . Kidney disease Brother   . Anesthesia problems Neg Hx     Social History Social History   Tobacco Use  . Smoking status: Never Smoker  . Smokeless tobacco: Never Used  Substance Use Topics  . Alcohol use: No  . Drug use: No     Allergies   Artichoke [cynara scolymus (artichoke)]   Review of Systems Review of Systems  HENT: Positive for dental problem and sore throat.   All other systems reviewed and are negative.    Physical Exam Updated Vital Signs BP 120/68 (BP Location: Left Arm)   Pulse 72   Temp 98.3 F (36.8 C) (Oral)   Resp 18   Ht 5\' 4"  (1.626 m)   Wt 107.5 kg (237 lb)   LMP 11/30/2017   SpO2 100%   BMI 40.68 kg/m   Physical Exam  Constitutional: She is oriented to person, place, and time. She appears well-developed and well-nourished.  HENT:  Mouth/Throat:    L lower molar with cavity. Gum swelling around L lower molar. Some L cervical LAD. No obvious purulent discharge or obvious periapical abscess. Tonsils not enlarged and uvula midline.   Eyes: Pupils are equal, round, and reactive to light. EOM  are normal.  Neck: Normal range of motion.  No stridor   Cardiovascular: Normal rate, regular rhythm and normal heart sounds.  Pulmonary/Chest: Effort normal and breath sounds normal. No stridor. No respiratory distress.  Abdominal: Soft. Bowel sounds  are normal. She exhibits no distension. There is no tenderness.  Musculoskeletal: Normal range of motion.  Neurological: She is alert and oriented to person, place, and time.  Skin: Skin is warm.  Psychiatric: She has a normal mood and affect.  Nursing note and vitals reviewed.    ED Treatments / Results  Labs (all labs ordered are listed, but only abnormal results are displayed) Labs Reviewed - No data to display  EKG None  Radiology No results found.  Procedures Procedures (including critical care time)  Medications Ordered in ED Medications  ibuprofen (ADVIL,MOTRIN) tablet 800 mg (has no administration in time range)  amoxicillin-clavulanate (AUGMENTIN) 875-125 MG per tablet 1 tablet (has no administration in time range)     Initial Impression / Assessment and Plan / ED Course  I have reviewed the triage vital signs and the nursing notes.  Pertinent labs & imaging results that were available during my care of the patient were reviewed by me and considered in my medical decision making (see chart for details).     Deshia Vanderhoof is a 42 y.o. female here with L lower molar pain. I think likely recurrent infection from the cavity. No signs of ludwig's and no PTA or RPA. Will give course of augmentin, tramadol prn. No signs of periapical abscess that requires I and D. Gave strict return precautions.    Final Clinical Impressions(s) / ED Diagnoses   Final diagnoses:  None    ED Discharge Orders    None       Drenda Freeze, MD 12/14/17 [phone]

## 2017-12-14 NOTE — Discharge Instructions (Signed)
Take tylenol, motrin for pain   Take tramadol for severe pain. Do NOT drive with it   Take augmentin twice daily for a week   You need to find a dentist for more definitive treatment   Return to ER if you have worse dental pain, gum swelling, jaw swelling, trouble swallowing or breathing

## 2017-12-14 NOTE — ED Triage Notes (Addendum)
Patient states that she is having pain to her right lower and upper dental pain - the patient states that she is nauseated and unable to eat and drink

## 2017-12-23 ENCOUNTER — Emergency Department (HOSPITAL_BASED_OUTPATIENT_CLINIC_OR_DEPARTMENT_OTHER): Payer: Medicaid Other

## 2017-12-23 ENCOUNTER — Encounter (HOSPITAL_BASED_OUTPATIENT_CLINIC_OR_DEPARTMENT_OTHER): Payer: Self-pay | Admitting: *Deleted

## 2017-12-23 ENCOUNTER — Other Ambulatory Visit: Payer: Self-pay

## 2017-12-23 ENCOUNTER — Emergency Department (HOSPITAL_BASED_OUTPATIENT_CLINIC_OR_DEPARTMENT_OTHER)
Admission: EM | Admit: 2017-12-23 | Discharge: 2017-12-24 | Disposition: A | Discharge status: Home / Self Care | Payer: Medicaid Other | Attending: Emergency Medicine | Admitting: Emergency Medicine

## 2017-12-23 DIAGNOSIS — Z79899 Other long term (current) drug therapy: Secondary | ICD-10-CM | POA: Diagnosis not present

## 2017-12-23 DIAGNOSIS — R1084 Generalized abdominal pain: Secondary | ICD-10-CM

## 2017-12-23 DIAGNOSIS — K6289 Other specified diseases of anus and rectum: Secondary | ICD-10-CM | POA: Diagnosis not present

## 2017-12-23 DIAGNOSIS — K5900 Constipation, unspecified: Secondary | ICD-10-CM | POA: Diagnosis not present

## 2017-12-23 DIAGNOSIS — R109 Unspecified abdominal pain: Secondary | ICD-10-CM | POA: Diagnosis not present

## 2017-12-23 DIAGNOSIS — K648 Other hemorrhoids: Secondary | ICD-10-CM | POA: Diagnosis not present

## 2017-12-23 LAB — LIPASE, BLOOD: LIPASE: 27 U/L (ref 11–51)

## 2017-12-23 LAB — URINALYSIS, ROUTINE W REFLEX MICROSCOPIC
BILIRUBIN URINE: NEGATIVE
Glucose, UA: NEGATIVE mg/dL
HGB URINE DIPSTICK: NEGATIVE
Ketones, ur: NEGATIVE mg/dL
Leukocytes, UA: NEGATIVE
NITRITE: NEGATIVE
PROTEIN: NEGATIVE mg/dL
Specific Gravity, Urine: 1.015 (ref 1.005–1.030)
pH: 7 (ref 5.0–8.0)

## 2017-12-23 LAB — CBC WITH DIFFERENTIAL/PLATELET
Basophils Absolute: 0 10*3/uL (ref 0.0–0.1)
Basophils Relative: 0 %
EOS ABS: 0 10*3/uL (ref 0.0–0.7)
EOS PCT: 1 %
HCT: 37.1 % (ref 36.0–46.0)
Hemoglobin: 12.5 g/dL (ref 12.0–15.0)
LYMPHS PCT: 18 %
Lymphs Abs: 1.2 10*3/uL (ref 0.7–4.0)
MCH: 28.9 pg (ref 26.0–34.0)
MCHC: 33.7 g/dL (ref 30.0–36.0)
MCV: 85.7 fL (ref 78.0–100.0)
MONO ABS: 0.6 10*3/uL (ref 0.1–1.0)
MONOS PCT: 10 %
Neutro Abs: 4.9 10*3/uL (ref 1.7–7.7)
Neutrophils Relative %: 71 %
PLATELETS: 264 10*3/uL (ref 150–400)
RBC: 4.33 MIL/uL (ref 3.87–5.11)
RDW: 13.7 % (ref 11.5–15.5)
WBC: 6.8 10*3/uL (ref 4.0–10.5)

## 2017-12-23 LAB — COMPREHENSIVE METABOLIC PANEL
ALT: 11 U/L (ref 0–44)
ANION GAP: 9 (ref 5–15)
AST: 17 U/L (ref 15–41)
Albumin: 3.9 g/dL (ref 3.5–5.0)
Alkaline Phosphatase: 41 U/L (ref 38–126)
BUN: 17 mg/dL (ref 6–20)
CALCIUM: 9.3 mg/dL (ref 8.9–10.3)
CHLORIDE: 104 mmol/L (ref 98–111)
CO2: 25 mmol/L (ref 22–32)
Creatinine, Ser: 0.69 mg/dL (ref 0.44–1.00)
Glucose, Bld: 108 mg/dL — ABNORMAL HIGH (ref 70–99)
Potassium: 3.9 mmol/L (ref 3.5–5.1)
SODIUM: 138 mmol/L (ref 135–145)
Total Bilirubin: 0.3 mg/dL (ref 0.3–1.2)
Total Protein: 7.2 g/dL (ref 6.5–8.1)

## 2017-12-23 LAB — PREGNANCY, URINE: PREG TEST UR: NEGATIVE

## 2017-12-23 MED ORDER — SODIUM CHLORIDE 0.9 % IV BOLUS
1000.0000 mL | Freq: Once | INTRAVENOUS | Status: AC
  Administered 2017-12-23: 1000 mL via INTRAVENOUS

## 2017-12-23 MED ORDER — METOCLOPRAMIDE HCL 5 MG/ML IJ SOLN
10.0000 mg | Freq: Once | INTRAMUSCULAR | Status: AC
  Administered 2017-12-23: 10 mg via INTRAVENOUS
  Filled 2017-12-23: qty 2

## 2017-12-23 NOTE — ED Provider Notes (Signed)
[name] Provider Note   CSN: [id number] Arrival date & time: 12/23/17  2222     History   Chief Complaint Chief Complaint  Patient presents with  . Constipation    HPI Melissa [name] is a 42 y.o. female.   The history is provided by the patient.   Abdominal Pain    This is a new problem. The current episode started more than 2 days ago. The problem occurs constantly. The problem has been gradually worsening. The pain is associated with an unknown factor. The pain is located in the generalized abdominal region. The quality of the pain is dull. The pain is at a severity of 6/10. The pain is moderate. Associated symptoms include constipation (failed multiple OTC medications). Pertinent negatives include anorexia, fever, belching, diarrhea, flatus, hematochezia, melena, nausea, vomiting, dysuria, frequency, hematuria, headaches, arthralgias and myalgias. The symptoms are aggravated by certain positions.  Nothing relieves the symptoms. Past workup does not include GI consult. Her past medical history does not include GERD or irritable bowel syndrome.    Past Medical History:  Diagnosis Date  . Abnormal Pap smear 08/30/2008   LGSIL  . Allergy   . BV (bacterial vaginosis)    recurring  . Chlamydia   . Chronic pain of left knee   . Ectopic pregnancy   . Fibroid   . Infertility    Secondary PCOS  . PCOS (polycystic ovarian syndrome)   . Shingles outbreak 11/2011  . Vertigo     Patient Active Problem List   Diagnosis Date Noted  . Right ankle injury 03/01/2016  . Rash and nonspecific skin eruption 08/11/2015  . Contraceptive management 10/22/2013  . Cold 10/22/2013  . Uterine fibroids, antepartum condition or complication 91/63/8466  . Ruptured ectopic pregnancy 07/05/2011  . Obesity 12/21/2010    Past Surgical History:  Procedure Laterality Date  . DILATION AND CURETTAGE OF UTERUS  06/2011   ectopic  . EAR TUBE REMOVAL    . ECTOPIC  PREGNANCY SURGERY    . LAPAROSCOPY  07/05/2011   Procedure: LAPAROSCOPY OPERATIVE;  Surgeon: Osborne Oman, MD;  Location: [facility];  Service: Gynecology;  Laterality: N/A;  . right ear surgery    . WISDOM TOOTH EXTRACTION       OB History    Gravida  5   Para  3   Term  3   Preterm  0   AB  2   Living  3     SAB  1   TAB  0   Ectopic  1   Multiple  0   Live Births  3            Home Medications    Prior to Admission medications   Medication Sig Start Date End Date Taking? Authorizing Provider  acetaminophen (TYLENOL) 500 MG tablet Take 1 tablet (500 mg total) by mouth every 6 (six) hours as needed. 10/06/17   Law, Bea Graff, PA-C  acyclovir (ZOVIRAX) 800 MG tablet Take 1 tablet (800 mg total) by mouth 5 (five) times daily. 07/07/17   Doristine Devoid, PA-C  amoxicillin-clavulanate (AUGMENTIN) 875-125 MG tablet Take 1 tablet by mouth 2 (two) times daily. One po bid x 7 days 12/14/17   Drenda Freeze, MD  azithromycin (ZITHROMAX) 250 MG tablet Take 1 tablet (250 mg total) by mouth daily. Take first 2 tablets together, then 1 every day until finished. 08/29/16   Larene Pickett, PA-C  benzonatate (TESSALON)  100 MG capsule Take 1 capsule (100 mg total) by mouth every 8 (eight) hours. 06/26/17   Carlisle Cater, PA-C  cyclobenzaprine (FLEXERIL) 10 MG tablet Take 1 tablet (10 mg total) by mouth 2 (two) times daily as needed for muscle spasms. 01/30/17   Long, Wonda Olds, MD  fluticasone Asencion Islam) 50 MCG/ACT nasal spray 1 spray each nares BID 03/26/17   Tanna Furry, MD  gabapentin (NEURONTIN) 100 MG capsule Take 1 capsule (100 mg total) by mouth 2 (two) times daily. 08/06/17   Petrucelli, Glynda Jaeger, PA-C  hydrocortisone (ANUSOL-HC) 2.5 % rectal cream Place 1 application rectally 2 (two) times daily. 12/24/17   Jacey Eckerson, DO  lidocaine (XYLOCAINE) 2 % solution Use as directed 15 mLs in the mouth or throat as needed for mouth pain. 08/29/16   Larene Pickett, PA-C    loperamide (IMODIUM) 2 MG capsule Take 1 capsule (2 mg total) by mouth 4 (four) times daily as needed for diarrhea or loose stools. 09/25/17   Horton, Barbette Hair, MD  loratadine (CLARITIN) 10 MG tablet Take 10 mg by mouth daily.    [provider]  meclizine (ANTIVERT) 25 MG tablet Take 1 tablet (25 mg total) 3 (three) times daily as needed by mouth for dizziness. 03/26/17   Tanna Furry, MD  Multiple Vitamins-Minerals (MULTIVITAMIN WITH MINERALS) tablet Take 1 tablet by mouth daily.    [provider]  naproxen (NAPROSYN) 500 MG tablet Take 1 tablet (500 mg total) by mouth 2 (two) times daily. 10/06/17   Law, Bea Graff, PA-C  ondansetron (ZOFRAN ODT) 4 MG disintegrating tablet Take 1 tablet (4 mg total) by mouth every 8 (eight) hours as needed for nausea or vomiting. 09/25/17   Horton, Barbette Hair, MD  polyethylene glycol (MIRALAX / GLYCOLAX) packet Take 17 g by mouth daily. 12/24/17   Dory Verdun, DO  traMADol (ULTRAM) 50 MG tablet Take 1 tablet (50 mg total) by mouth every 6 (six) hours as needed. 12/14/17   Drenda Freeze, MD  diphenhydrAMINE (BENADRYL) 12.5 MG/5ML liquid Take 12.5 mg by mouth once.  08/01/11  [provider]    Family History Family History  Problem Relation Age of Onset  . Diabetes Mother   . Hypertension Mother   . Cancer Father   . Fibroids Sister   . Hypertension Sister   . Crohn's disease Brother   . Cancer Brother   . Diabetes Brother   . Hypertension Brother   . Kidney disease Brother   . Anesthesia problems Neg Hx     Social History Social History   Tobacco Use  . Smoking status: Never Smoker  . Smokeless tobacco: Never Used  Substance Use Topics  . Alcohol use: No  . Drug use: No     Allergies   Artichoke [cynara scolymus (artichoke)]   Review of Systems Review of Systems  Constitutional: Negative for chills and fever.  HENT: Negative for ear pain and sore throat.   Eyes: Negative for pain and visual  disturbance.  Respiratory: Negative for cough and shortness of breath.   Cardiovascular: Negative for chest pain and palpitations.  Gastrointestinal: Positive for abdominal pain and constipation (failed multiple OTC medications). Negative for anorexia, diarrhea, flatus, hematochezia, melena, nausea and vomiting.  Genitourinary: Negative for dysuria, frequency and hematuria.  Musculoskeletal: Negative for arthralgias, back pain and myalgias.  Skin: Negative for color change and rash.  Neurological: Negative for seizures, syncope and headaches.  All other systems reviewed and are negative.  Physical Exam Updated Vital Signs  ED Triage Vitals  Enc Vitals Group     BP 12/23/17 2227 (!) 162/100     Pulse Rate 12/23/17 2227 94     Resp 12/23/17 2227 20     Temp 12/23/17 2227 98.6 F (37 C)     Temp Source 12/23/17 2227 Oral     SpO2 12/23/17 2227 98 %     Weight 12/23/17 2225 236 lb 15.9 oz (107.5 kg)     Height 12/23/17 2225 5\' 4"  (1.626 m)     Head Circumference --      Peak Flow --      Pain Score 12/23/17 2225 7     Pain Loc --      Pain Edu? --      Excl. in [place]? --     Physical Exam  Constitutional: She appears well-developed and well-nourished. No distress.  HENT:  Head: Normocephalic and atraumatic.  Eyes: Pupils are equal, round, and reactive to light. Conjunctivae and EOM are normal.  Neck: Normal range of motion. Neck supple.  Cardiovascular: Normal rate and regular rhythm.  No murmur heard. Pulmonary/Chest: Effort normal and breath sounds normal. No respiratory distress.  Abdominal: Soft. There is tenderness. There is no rebound and no guarding.  Musculoskeletal: Normal range of motion. She exhibits no edema.  Neurological: She is alert.  Skin: Skin is warm and dry.  Psychiatric: She has a normal mood and affect.  Nursing note and vitals reviewed.    ED Treatments / Results  Labs (all labs ordered are listed, but only abnormal results are displayed) Labs  Reviewed  COMPREHENSIVE METABOLIC PANEL - Abnormal; Notable for the following components:      Result Value   Glucose, Bld 108 (*)    All other components within normal limits  CBC WITH DIFFERENTIAL/PLATELET  LIPASE, BLOOD  URINALYSIS, ROUTINE W REFLEX MICROSCOPIC  PREGNANCY, URINE    EKG None  Radiology Ct Abdomen Pelvis W Contrast  Result Date: 12/24/2017 CLINICAL DATA:  Abdominal pain EXAM: CT ABDOMEN AND PELVIS WITH CONTRAST TECHNIQUE: Multidetector CT imaging of the abdomen and pelvis was performed using the standard protocol following bolus administration of intravenous contrast. CONTRAST:  15mL ISOVUE-300 IOPAMIDOL (ISOVUE-300) INJECTION 61% COMPARISON:  09/25/2017 FINDINGS: Lower chest: No acute abnormality. Hepatobiliary: No focal liver abnormality is seen. No gallstones, gallbladder wall thickening, or biliary dilatation. Pancreas: Unremarkable. No pancreatic ductal dilatation or surrounding inflammatory changes. Spleen: Normal in size without focal abnormality. Adrenals/Urinary Tract: Adrenal glands are unremarkable. Kidneys are normal, without renal calculi, focal lesion, or hydronephrosis. Bladder is unremarkable. Stomach/Bowel: The appendix is again well visualized and mildly dilated. This is stable in appearance from the prior exam. No significant inflammatory changes are seen to suggest appendicitis. Additionally a normal white blood cell count is present. 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 Rectum is mildly distended with fecal material. Some mild perirectal inflammatory changes are seen without definitive obstruction. Vascular/Lymphatic: No significant vascular findings are present. No enlarged abdominal or pelvic lymph nodes. Reproductive: Uterus is bulky with some mottled enhancement consistent with underlying uterine fibroid change. No adnexal mass is noted. Other: No abdominal wall hernia or abnormality. No abdominopelvic ascites. Musculoskeletal: No acute or  significant osseous findings. 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 No inflammatory changes are seen. Changes of uterine fibroid disease. Mild fecal material within the rectum with some perirectal inflammatory change which may represent some proctitis. Electronically Signed   By: Elta Guadeloupe  Lukens M.D.   On: 12/24/2017 00:40    Procedures Procedures (including critical care time)  Medications Ordered in ED Medications  sorbitol 70 % solution 30 mL (has no administration in time range)  sodium chloride 0.9 % bolus 1,000 mL (1,000 mLs Intravenous New Bag/Given 12/23/17 2256)  metoCLOPramide (REGLAN) injection 10 mg (10 mg Intravenous Given 12/23/17 2300)  iopamidol (ISOVUE-300) 61 % injection 100 mL (100 mLs Intravenous Contrast Given 12/24/17 0013)     Initial Impression / Assessment and Plan / ED Course  I have reviewed the triage vital signs and the nursing notes.  Pertinent labs & imaging results that were available during my care of the patient were reviewed by me and considered in my medical decision making (see chart for details).     Valory Wetherby is a 42 year old female with history of PCOS who presents to the ED with abdominal pain and constipation.  Patient with normal vitals.  No fever.  Patient states that she has had increasing abdominal pain over the last several days secondary to what she believes is constipation.  Patient has tried multiple over-the-counter medications for constipation that has not helped and no BM in several days.  She was recently started on Augmentin for dental infection and she states that is likely the cause of her constipation.  She denies any nausea, vomiting, vaginal discharge, dysuria.  Patient with mild abdominal pain on exam but otherwise exam is unremarkable. Patient with no chest pain, no shortness of breath.  Urinalysis showed no signs of infection.  Patient with negative pregnancy test.  No significant leukocytosis,  electrolyte abnormality, acute kidney injury.  Patient had CT scan of the abdomen and pelvis that showed findings consistent with constipation.  Patient with no signs of obstruction.  Patient with some inflammation in the perirectal area likely secondary to constipation.  Patient given sorbitol while in the ED and given prescription for MiraLAX and Anusol. Patient likely with discomfort secondary to constipation.  No signs of perirectal abscess on CT scan and laboratory work overall reassuring from an infectious standpoint. She was given fluids and reglan in the with some relief as well. Told her return to the ED if symptoms worsen.  Discharged in good condition. Given information for PCP follow up.   Final Clinical Impressions(s) / ED Diagnoses   Final diagnoses:  Generalized abdominal pain  Constipation, unspecified constipation type    ED Discharge Orders         Ordered    polyethylene glycol (MIRALAX / GLYCOLAX) packet  Daily     12/24/17 0050    hydrocortisone (ANUSOL-HC) 2.5 % rectal cream  2 times daily     12/24/17 0050          Lennice Sites, DO 12/24/17 0165

## 2017-12-23 NOTE — ED Triage Notes (Signed)
Constipation. She does not know when she had a BM.

## 2017-12-23 NOTE — ED Notes (Signed)
Pt called out asking for pain medication. MD aware.

## 2017-12-23 NOTE — ED Notes (Signed)
Pt wanting pain medication. MD aware. No new orders received. Pt unable to provide urine sample at this time.

## 2017-12-23 NOTE — ED Notes (Signed)
ED Provider at bedside. 

## 2017-12-24 ENCOUNTER — Encounter (HOSPITAL_COMMUNITY): Payer: Self-pay | Admitting: Emergency Medicine

## 2017-12-24 ENCOUNTER — Emergency Department (HOSPITAL_COMMUNITY)
Admission: EM | Admit: 2017-12-24 | Discharge: 2017-12-24 | Disposition: A | Discharge status: Home / Self Care | Payer: Medicaid Other | Source: Home / Self Care | Attending: Emergency Medicine | Admitting: Emergency Medicine

## 2017-12-24 DIAGNOSIS — K648 Other hemorrhoids: Secondary | ICD-10-CM | POA: Diagnosis not present

## 2017-12-24 DIAGNOSIS — K649 Unspecified hemorrhoids: Secondary | ICD-10-CM

## 2017-12-24 DIAGNOSIS — R109 Unspecified abdominal pain: Secondary | ICD-10-CM | POA: Diagnosis not present

## 2017-12-24 LAB — POC OCCULT BLOOD, ED: FECAL OCCULT BLD: NEGATIVE

## 2017-12-24 MED ORDER — POLYETHYLENE GLYCOL 3350 17 G PO PACK: 17.0000 g | PACK | Freq: Every day | ORAL | 0 refills | Status: DC

## 2017-12-24 MED ORDER — LIDOCAINE HCL URETHRAL/MUCOSAL 2 % EX GEL
1.0000 "application " | Freq: Once | CUTANEOUS | Status: AC
  Administered 2017-12-24: 1 via TOPICAL
  Filled 2017-12-24: qty 20

## 2017-12-24 MED ORDER — CYCLOBENZAPRINE HCL 5 MG PO TABS: 5.0000 mg | ORAL_TABLET | Freq: Three times a day (TID) | ORAL | 0 refills | Status: DC | PRN

## 2017-12-24 MED ORDER — SORBITOL 70 % SOLN
30.0000 mL | Freq: Once | Status: AC
Start: 2017-12-24 — End: 2017-12-24
  Administered 2017-12-24: 30 mL via ORAL
  Filled 2017-12-24: qty 30

## 2017-12-24 MED ORDER — IOPAMIDOL (ISOVUE-300) INJECTION 61%
100.0000 mL | Freq: Once | INTRAVENOUS | Status: AC | PRN
  Administered 2017-12-24: 100 mL via INTRAVENOUS

## 2017-12-24 MED ORDER — HYDROCORTISONE ACETATE 25 MG RE SUPP
25.0000 mg | Freq: Once | RECTAL | Status: AC
  Administered 2017-12-24: 25 mg via RECTAL
  Filled 2017-12-24: qty 1

## 2017-12-24 MED ORDER — POLYETHYLENE GLYCOL 3350 17 G PO PACK
17.0000 g | PACK | Freq: Every day | ORAL | Status: DC
  Administered 2017-12-24: 17 g via ORAL
  Filled 2017-12-24: qty 1

## 2017-12-24 MED ORDER — HYDROCORTISONE 2.5 % RE CREA: 1.0000 "application " | TOPICAL_CREAM | Freq: Two times a day (BID) | RECTAL | 0 refills | Status: DC

## 2017-12-24 NOTE — ED Notes (Signed)
Called pt in waiting room, no answer

## 2017-12-24 NOTE — ED Notes (Signed)
Pt stable, ambulatory, states understanding of discharge instructions 

## 2017-12-24 NOTE — ED Provider Notes (Signed)
[facility] EMERGENCY DEPARTMENT Provider Note   CSN: [id number] Arrival date & time: 12/24/17  1329     History   Chief Complaint Chief Complaint  Patient presents with  . Constipation    HPI Melissa [name] is a 42 y.o. female.  Patient is a 42 year old female presenting for rectal pain. Patient was recently seen at [facility] on 8/12 for the same problem. At that time CT abdomen was obtained showing mild fecal material within rectum with some perirectal change representing proctitis. Patient was discharged with miralax and anusol. Patient has not picked up these prescriptions as she stated she was in severe pain and came to ED directly.  Patient reports that she is had continued constipation for "a long time" and notes that she has not had a bowel movement in over 2 weeks.  Patient reports that she had severe straining yesterday which is what brought her into the emergency room initially.  Patient said it is very uncomfortable and she is unable to sit on her bottom due to pain.  Patient says this is affecting her sleep as well.  Patient says she has not been able to eat or drink well for the past few days she is severely constipated.  Patient notes that she has noted that her anal region is "swollen".  Patient notes some rectal bleeding yesterday after straining. Patient notes no medication changes recently other than starting Augmentin for an acute otitis media infection as well as tooth infection.      Past Medical History:  Diagnosis Date  . Abnormal Pap smear 08/30/2008   LGSIL  . Allergy   . BV (bacterial vaginosis)    recurring  . Chlamydia   . Chronic pain of left knee   . Ectopic pregnancy   . Fibroid   . Infertility    Secondary PCOS  . PCOS (polycystic ovarian syndrome)   . Shingles outbreak 11/2011  . Vertigo     Patient Active Problem List   Diagnosis Date Noted  . Right ankle injury 03/01/2016  . Rash and nonspecific skin  eruption 08/11/2015  . Contraceptive management 10/22/2013  . Cold 10/22/2013  . Uterine fibroids, antepartum condition or complication 41/96/2229  . Ruptured ectopic pregnancy 07/05/2011  . Obesity 12/21/2010    Past Surgical History:  Procedure Laterality Date  . DILATION AND CURETTAGE OF UTERUS  06/2011   ectopic  . EAR TUBE REMOVAL    . ECTOPIC PREGNANCY SURGERY    . LAPAROSCOPY  07/05/2011   Procedure: LAPAROSCOPY OPERATIVE;  Surgeon: Osborne Oman, MD;  Location: [facility];  Service: Gynecology;  Laterality: N/A;  . right ear surgery    . WISDOM TOOTH EXTRACTION       OB History    Gravida  5   Para  3   Term  3   Preterm  0   AB  2   Living  3     SAB  1   TAB  0   Ectopic  1   Multiple  0   Live Births  3            Home Medications    Prior to Admission medications   Medication Sig Start Date End Date Taking? Authorizing Provider  hydrocortisone (ANUSOL-HC) 2.5 % rectal cream Place 1 application rectally 2 (two) times daily. 12/24/17  Yes Curatolo, Adam, DO  OVER THE COUNTER MEDICATION Caster Oil   Yes [provider]  polyethylene glycol (  MIRALAX / GLYCOLAX) packet Take 17 g by mouth daily. 12/24/17  Yes Curatolo, Adam, DO  acyclovir (ZOVIRAX) 800 MG tablet Take 1 tablet (800 mg total) by mouth 5 (five) times daily. Patient not taking: Reported on 12/24/2017 07/07/17   Ocie Cornfield T, PA-C  cyclobenzaprine (FLEXERIL) 5 MG tablet Take 1 tablet (5 mg total) by mouth 3 (three) times daily as needed for muscle spasms. 12/24/17   Caroline More, DO  fluticasone Asencion Islam) 50 MCG/ACT nasal spray 1 spray each nares BID Patient not taking: Reported on 12/24/2017 03/26/17   Tanna Furry, MD  gabapentin (NEURONTIN) 100 MG capsule Take 1 capsule (100 mg total) by mouth 2 (two) times daily. Patient not taking: Reported on 12/24/2017 08/06/17   Petrucelli, Glynda Jaeger, PA-C  diphenhydrAMINE (BENADRYL) 12.5 MG/5ML liquid Take 12.5 mg by mouth once.   08/01/11  [provider]    Family History Family History  Problem Relation Age of Onset  . Diabetes Mother   . Hypertension Mother   . Cancer Father   . Fibroids Sister   . Hypertension Sister   . Crohn's disease Brother   . Cancer Brother   . Diabetes Brother   . Hypertension Brother   . Kidney disease Brother   . Anesthesia problems Neg Hx     Social History Social History   Tobacco Use  . Smoking status: Never Smoker  . Smokeless tobacco: Never Used  Substance Use Topics  . Alcohol use: No  . Drug use: No     Allergies   Artichoke [cynara scolymus (artichoke)]   Review of Systems Review of Systems  Constitutional: Positive for appetite change.  Gastrointestinal: Positive for abdominal pain, anal bleeding, constipation, nausea and rectal pain. Negative for vomiting.     Physical Exam Updated Vital Signs BP 128/77 (BP Location: Left Arm)   Pulse 64   Temp 98.9 F (37.2 C) (Oral)   Resp 16   LMP 11/30/2017 Comment: (-) urine pregnancy//a.c.  SpO2 100%   Physical Exam  Constitutional: She appears well-nourished. She appears distressed.  HENT:  Head: Normocephalic.  Eyes: Conjunctivae are normal.  Cardiovascular: Normal rate, regular rhythm and normal heart sounds. Exam reveals no gallop and no friction rub.  No murmur heard. Pulmonary/Chest: Effort normal and breath sounds normal. No respiratory distress. She has no wheezes. She has no rales.  Abdominal: Soft. Bowel sounds are normal. There is tenderness. There is no guarding.  Diffuse tenderness throughout  Genitourinary: Rectal exam shows internal hemorrhoid and tenderness. Rectal exam shows no mass, anal tone normal and guaiac negative stool.  Musculoskeletal: She exhibits no edema.  Neurological: She is alert.  Skin: Skin is warm.     ED Treatments / Results  Labs (all labs ordered are listed, but only abnormal results are displayed) Labs Reviewed  POC OCCULT BLOOD, ED     EKG None  Radiology Ct Abdomen Pelvis W Contrast  Result Date: 12/24/2017 CLINICAL DATA:  Abdominal pain EXAM: CT ABDOMEN AND PELVIS WITH CONTRAST TECHNIQUE: Multidetector CT imaging of the abdomen and pelvis was performed using the standard protocol following bolus administration of intravenous contrast. CONTRAST:  175mL ISOVUE-300 IOPAMIDOL (ISOVUE-300) INJECTION 61% COMPARISON:  09/25/2017 FINDINGS: Lower chest: No acute abnormality. Hepatobiliary: No focal liver abnormality is seen. No gallstones, gallbladder wall thickening, or biliary dilatation. Pancreas: Unremarkable. No pancreatic ductal dilatation or surrounding inflammatory changes. Spleen: Normal in size without focal abnormality. Adrenals/Urinary Tract: Adrenal glands are unremarkable. Kidneys are normal, without renal calculi, focal lesion,  or hydronephrosis. Bladder is unremarkable. Stomach/Bowel: The appendix is again well visualized and mildly dilated. This is stable in appearance from the prior exam. No significant inflammatory changes are seen to suggest appendicitis. Additionally a normal white blood cell count is present. 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 Rectum is mildly distended with fecal material. Some mild perirectal inflammatory changes are seen without definitive obstruction. Vascular/Lymphatic: No significant vascular findings are present. No enlarged abdominal or pelvic lymph nodes. Reproductive: Uterus is bulky with some mottled enhancement consistent with underlying uterine fibroid change. No adnexal mass is noted. Other: No abdominal wall hernia or abnormality. No abdominopelvic ascites. Musculoskeletal: No acute or significant osseous findings. 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 No inflammatory changes are seen. Changes of uterine fibroid disease. Mild fecal material within the rectum with some perirectal inflammatory change which may represent some  proctitis. Electronically Signed   By: Inez Catalina M.D.   On: 12/24/2017 00:40    Procedures Procedures (including critical care time)  Medications Ordered in ED Medications  hydrocortisone (ANUSOL-HC) suppository 25 mg (has no administration in time range)  polyethylene glycol (MIRALAX / GLYCOLAX) packet 17 g (has no administration in time range)     Initial Impression / Assessment and Plan / ED Course  I have reviewed the triage vital signs and the nursing notes.  Pertinent labs & imaging results that were available during my care of the patient were reviewed by me and considered in my medical decision making (see chart for details).     Patient is a 42 y.o. Female presenting presenting with worsening rectal pain and constipation.  Rectal exam showing internal hemorrhoids and pain.  Given that patient has severe discomfort we will give MiraLAX and Anusol suppository here in ED to help reduce inflammation and improve constipation.  Patient already has prescription sent to pharmacy for Anusol cream and MiraLAX.  Encourage patient to use these prescriptions daily.  We will also give 5 tablets of Flexeril given that patient feels like she is having muscle spasms in rectal area.  Recent imaging and lab work performed yesterday night at [facility] so will not need to repeat.  CT reassuring that no abscess was formed.  Proctitis likely inflammatory and noninfectious as patient is afebrile and has stable vitals.  Patient is also nontoxic-appearing.  Patient is stable for discharge home with strict return precautions.  Plan with Dr. Reather Converse who independently examined patient and agrees with plan.  Final Clinical Impressions(s) / ED Diagnoses   Final diagnoses:  Hemorrhoids, unspecified hemorrhoid type    ED Discharge Orders         Ordered    cyclobenzaprine (FLEXERIL) 5 MG tablet  3 times daily PRN     12/24/17 [place], [place], DO 12/24/17 1747     Elnora Morrison, MD 12/30/17 [phone]

## 2017-12-24 NOTE — Discharge Instructions (Addendum)
Please use anusol cream twice daily for 1 week. Continue miralax daily and you may want to use an enema as well to help relieve constipation. If you develop fever or worsening symptoms please follow up in the emergency room. Please follow up with your PCP within 1 week.

## 2017-12-24 NOTE — ED Notes (Signed)
Patient transported to CT 

## 2017-12-24 NOTE — ED Triage Notes (Signed)
Pt arrives to ED with constipation for 2 weeks was seen at med center yesterday. Pt states this was not resolved. Pt states she thinks she tore something in her rectum while trying to have a BM.

## 2018-02-20 ENCOUNTER — Encounter: Payer: Self-pay | Admitting: *Deleted

## 2018-02-20 ENCOUNTER — Ambulatory Visit: Payer: Medicaid Other | Admitting: Obstetrics and Gynecology

## 2018-02-20 ENCOUNTER — Encounter (HOSPITAL_BASED_OUTPATIENT_CLINIC_OR_DEPARTMENT_OTHER): Payer: Self-pay | Admitting: *Deleted

## 2018-02-20 ENCOUNTER — Other Ambulatory Visit: Payer: Self-pay

## 2018-02-20 ENCOUNTER — Emergency Department (HOSPITAL_BASED_OUTPATIENT_CLINIC_OR_DEPARTMENT_OTHER)
Admission: EM | Admit: 2018-02-20 | Discharge: 2018-02-20 | Disposition: A | Discharge status: Home / Self Care | Payer: Medicaid Other | Attending: Emergency Medicine | Admitting: Emergency Medicine

## 2018-02-20 DIAGNOSIS — K047 Periapical abscess without sinus: Secondary | ICD-10-CM | POA: Diagnosis not present

## 2018-02-20 DIAGNOSIS — Z79899 Other long term (current) drug therapy: Secondary | ICD-10-CM | POA: Insufficient documentation

## 2018-02-20 DIAGNOSIS — K0889 Other specified disorders of teeth and supporting structures: Secondary | ICD-10-CM | POA: Diagnosis present

## 2018-02-20 MED ORDER — PENICILLIN V POTASSIUM 250 MG PO TABS
500.0000 mg | ORAL_TABLET | Freq: Once | ORAL | Status: AC
  Administered 2018-02-20: 500 mg via ORAL
  Filled 2018-02-20: qty 2

## 2018-02-20 MED ORDER — HYDROCODONE-ACETAMINOPHEN 5-325 MG PO TABS: ORAL_TABLET | ORAL | 0 refills | Status: DC

## 2018-02-20 MED ORDER — PENICILLIN V POTASSIUM 500 MG PO TABS: 500.0000 mg | ORAL_TABLET | Freq: Three times a day (TID) | ORAL | 0 refills | Status: DC

## 2018-02-20 NOTE — Progress Notes (Signed)
Melissa [name] did not keep her scheduled appointment for yearly exam. Per discussion with Dr.Constant does not need to be called, may reschedule if she calls.

## 2018-02-20 NOTE — Discharge Instructions (Signed)
Please read and follow all provided instructions.  Your diagnoses today include:  1. Dental abscess    The exam and treatment you received today has been provided on an emergency basis only. This is not a substitute for complete medical or dental care.  Tests performed today include:  Vital signs. See below for your results today.   Medications prescribed:   Vicodin (hydrocodone/acetaminophen) - narcotic pain medication  DO NOT drive or perform any activities that require you to be awake and alert because this medicine can make you drowsy. BE VERY CAREFUL not to take multiple medicines containing Tylenol (also called acetaminophen). Doing so can lead to an overdose which can damage your liver and cause liver failure and possibly death.   Penicillin - antibiotic  You have been prescribed an antibiotic medicine: take the entire course of medicine even if you are feeling better. Stopping early can cause the antibiotic not to work.  Take any prescribed medications only as directed.  Home care instructions:  Follow any educational materials contained in this packet.  Follow-up instructions: Please follow-up with your dentist for further evaluation of your symptoms.   Return instructions:   Please return to the Emergency Department if you experience worsening symptoms.  Please return if you develop a fever, you develop more swelling in your face or neck, you have trouble breathing or swallowing food.  Please return if you have any other emergent concerns.  Additional Information:  Your vital signs today were: BP (!) 144/79 (BP Location: Right Arm)    Pulse 66    Temp 98.8 F (37.1 C) (Oral)    Resp 18    Ht 5\' 4"  (1.626 m)    Wt 111.6 kg    LMP 02/19/2018    SpO2 99%    BMI 42.23 kg/m  If your blood pressure (BP) was elevated above 135/85 this visit, please have this repeated by your doctor within one month. --------------

## 2018-02-20 NOTE — ED Triage Notes (Signed)
Pt c/o dental pain x 4 days

## 2018-02-20 NOTE — ED Notes (Signed)
Pt verbalizes understanding of d/c instructions and denies any further needs at this time. 

## 2018-02-20 NOTE — ED Provider Notes (Signed)
[facility] EMERGENCY DEPARTMENT Provider Note   CSN: [id number] Arrival date & time: 02/20/18  4854     History   Chief Complaint Chief Complaint  Patient presents with  . Dental Pain    HPI Melissa [name] is a 42 y.o. female.  Patient presents with a four-day history of left lower dental pain.  She also reports facial swelling that has been waxing and waning as well.  Pain radiates up towards the ear.  She is able to swallow without choking.  No fevers, neck pain, or neck swelling.  She has had some similar symptoms in this area in the past.     Past Medical History:  Diagnosis Date  . Abnormal Pap smear 08/30/2008   LGSIL  . Allergy   . BV (bacterial vaginosis)    recurring  . Chlamydia   . Chronic pain of left knee   . Ectopic pregnancy   . Fibroid   . Infertility    Secondary PCOS  . PCOS (polycystic ovarian syndrome)   . Shingles outbreak 11/2011  . Vertigo     Patient Active Problem List   Diagnosis Date Noted  . Right ankle injury 03/01/2016  . Rash and nonspecific skin eruption 08/11/2015  . Contraceptive management 10/22/2013  . Cold 10/22/2013  . Uterine fibroids, antepartum condition or complication 62/70/3500  . Ruptured ectopic pregnancy 07/05/2011  . Obesity 12/21/2010    Past Surgical History:  Procedure Laterality Date  . DILATION AND CURETTAGE OF UTERUS  06/2011   ectopic  . EAR TUBE REMOVAL    . ECTOPIC PREGNANCY SURGERY    . LAPAROSCOPY  07/05/2011   Procedure: LAPAROSCOPY OPERATIVE;  Surgeon: Osborne Oman, MD;  Location: [facility];  Service: Gynecology;  Laterality: N/A;  . right ear surgery    . WISDOM TOOTH EXTRACTION       OB History    Gravida  5   Para  3   Term  3   Preterm  0   AB  2   Living  3     SAB  1   TAB  0   Ectopic  1   Multiple  0   Live Births  3            Home Medications    Prior to Admission medications   Medication Sig Start Date End Date Taking? Authorizing  Provider  cyclobenzaprine (FLEXERIL) 5 MG tablet Take 1 tablet (5 mg total) by mouth 3 (three) times daily as needed for muscle spasms. 12/24/17   Caroline More, DO  HYDROcodone-acetaminophen (NORCO/VICODIN) 5-325 MG tablet Take 1-2 tablets every 6 hours as needed for severe pain 02/20/18   Carlisle Cater, PA-C  hydrocortisone (ANUSOL-HC) 2.5 % rectal cream Place 1 application rectally 2 (two) times daily. 12/24/17   Curatolo, Adam, DO  OVER THE COUNTER MEDICATION Caster Oil    [provider]  penicillin v potassium (VEETID) 500 MG tablet Take 1 tablet (500 mg total) by mouth 3 (three) times daily. 02/20/18   Carlisle Cater, PA-C  polyethylene glycol (MIRALAX / GLYCOLAX) packet Take 17 g by mouth daily. 12/24/17   Curatolo, Adam, DO  diphenhydrAMINE (BENADRYL) 12.5 MG/5ML liquid Take 12.5 mg by mouth once.  08/01/11  [provider]    Family History Family History  Problem Relation Age of Onset  . Diabetes Mother   . Hypertension Mother   . Cancer Father   . Fibroids Sister   . Hypertension Sister   .  Crohn's disease Brother   . Cancer Brother   . Diabetes Brother   . Hypertension Brother   . Kidney disease Brother   . Anesthesia problems Neg Hx     Social History Social History   Tobacco Use  . Smoking status: Never Smoker  . Smokeless tobacco: Never Used  Substance Use Topics  . Alcohol use: No  . Drug use: No     Allergies   Artichoke [cynara scolymus (artichoke)]   Review of Systems Review of Systems  Constitutional: Negative for fever.  HENT: Positive for dental problem and facial swelling. Negative for ear pain, sore throat and trouble swallowing.   Respiratory: Negative for shortness of breath and stridor.   Musculoskeletal: Negative for neck pain.  Skin: Negative for color change.  Neurological: Negative for headaches.     Physical Exam Updated Vital Signs BP (!) 144/79 (BP Location: Right Arm)   Pulse 66   Temp 98.8 F (37.1 C)  (Oral)   Resp 18   Ht 5\' 4"  (1.626 m)   Wt 111.6 kg   LMP 02/19/2018   SpO2 99%   BMI 42.23 kg/m   Physical Exam  Constitutional: She appears well-developed and well-nourished.  HENT:  Head: Normocephalic and atraumatic.  Right Ear: Tympanic membrane, external ear and ear canal normal.  Left Ear: Tympanic membrane, external ear and ear canal normal.  Nose: Nose normal.  Mouth/Throat: Uvula is midline, oropharynx is clear and moist and mucous membranes are normal. No trismus in the jaw. Abnormal dentition. Dental caries present. No dental abscesses or uvula swelling. No tonsillar abscesses.  Patient with generalized tenderness and swelling in the area of the left mandibular jaw.  No discrete abscess amenable to drainage tonight.  No signs of trismus.  Eyes: Conjunctivae are normal.  Neck: Normal range of motion. Neck supple.  No neck swelling or Ludwig's angina  Lymphadenopathy:    She has no cervical adenopathy.  Neurological: She is alert.  Skin: Skin is warm and dry.  Psychiatric: She has a normal mood and affect.  Nursing note and vitals reviewed.    ED Treatments / Results  Labs (all labs ordered are listed, but only abnormal results are displayed) Labs Reviewed - No data to display  EKG None  Radiology No results found.  Procedures Procedures (including critical care time)  Medications Ordered in ED Medications  penicillin v potassium (VEETID) tablet 500 mg (has no administration in time range)     Initial Impression / Assessment and Plan / ED Course  I have reviewed the triage vital signs and the nursing notes.  Pertinent labs & imaging results that were available during my care of the patient were reviewed by me and considered in my medical decision making (see chart for details).     9:30 PM Patient seen and examined. Medications ordered.   Vital signs reviewed and are as follows: BP (!) 144/79 (BP Location: Right Arm)   Pulse 66   Temp 98.8 F  (37.1 C) (Oral)   Resp 18   Ht 5\' 4"  (1.626 m)   Wt 111.6 kg   LMP 02/19/2018   SpO2 99%   BMI 42.23 kg/m   Patient counseled on use of narcotic pain medications. Counseled not to combine these medications with others containing tylenol. Urged not to drink alcohol, drive, or perform any other activities that requires focus while taking these medications. The patient verbalizes understanding and agrees with the plan.  Patient counseled to take prescribed  medications as directed, return with worsening facial or neck swelling, and to follow-up with their dentist as soon as possible.    Final Clinical Impressions(s) / ED Diagnoses   Final diagnoses:  Dental abscess   Patient with toothache. No fever. Exam unconcerning for Ludwig's angina or other deep tissue infection in neck.   As there is gum swelling, erythema, and facial swelling, will treat with antibiotic and pain medicine. Urged patient to follow-up with dentist.     ED Discharge Orders         Ordered    penicillin v potassium (VEETID) 500 MG tablet  3 times daily     02/20/18 2128    HYDROcodone-acetaminophen (NORCO/VICODIN) 5-325 MG tablet     02/20/18 2128           Carlisle Cater, PA-C 02/20/18 2131    Gareth Morgan, MD 02/21/18 1245

## 2018-03-23 IMAGING — CR DG ANKLE COMPLETE 3+V*R*
3 series · 3 of 3 positions shown · non-contrast
Comparison: Right ankle radiograph 01/31/2009

CLINICAL DATA: Chronic right foot and right ankle pain for several
months.

EXAM:
RIGHT ANKLE - COMPLETE 3+ VIEW

[t ankle joint ap right]
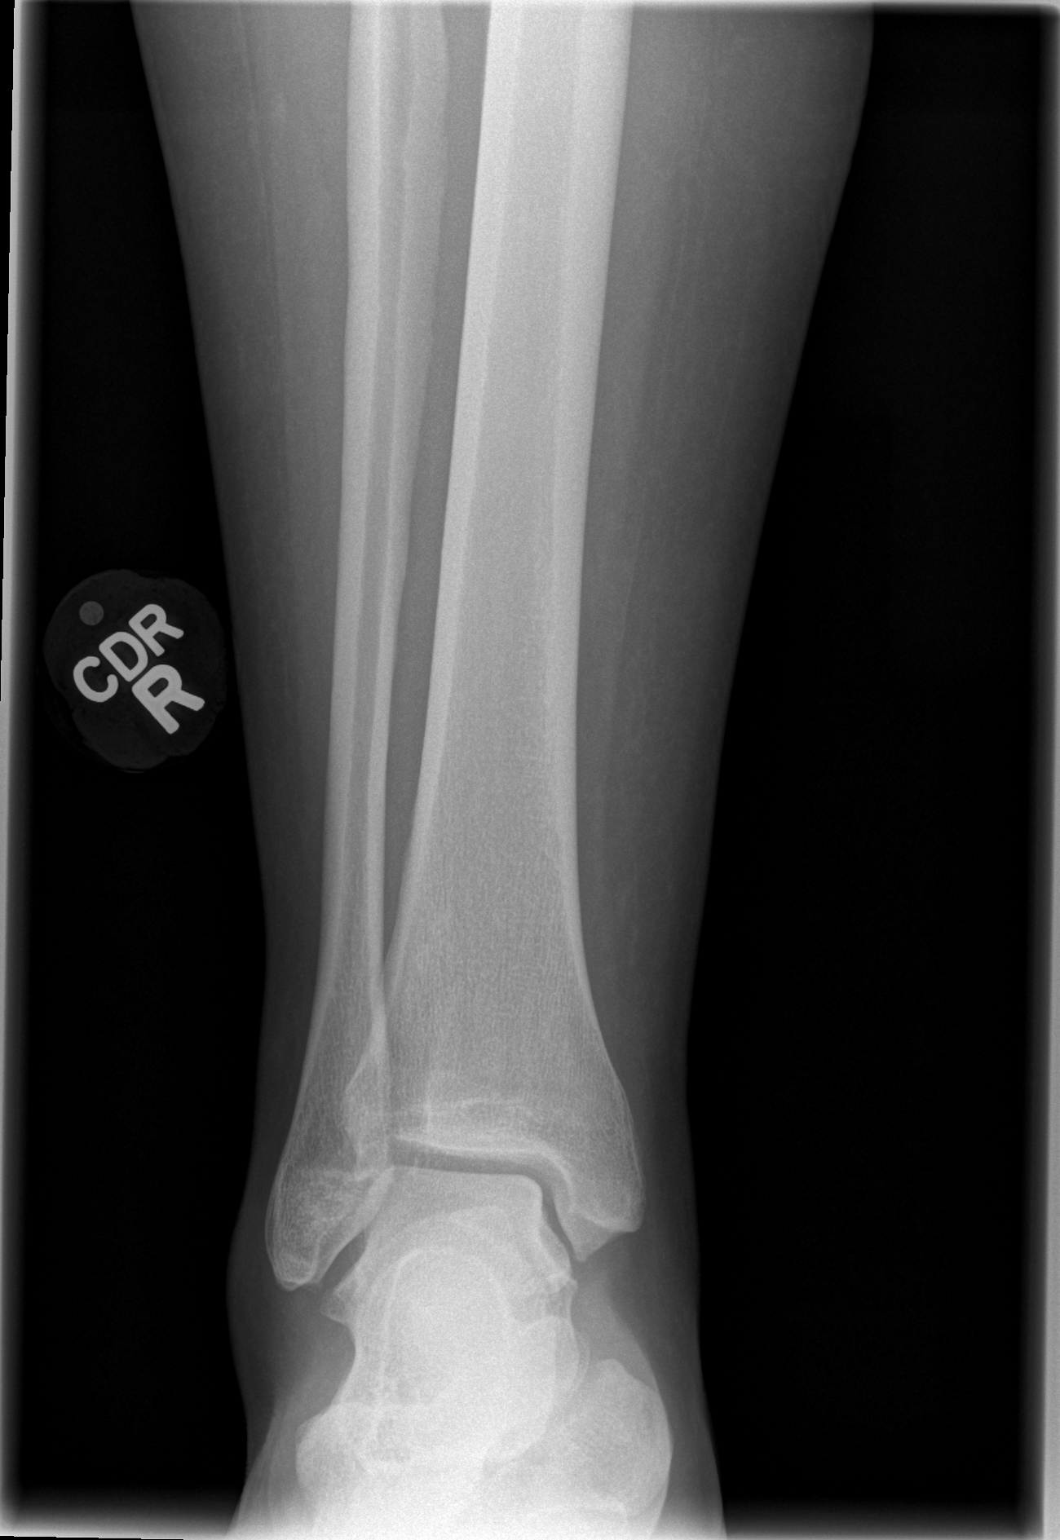

[t ankle joint oblique right]
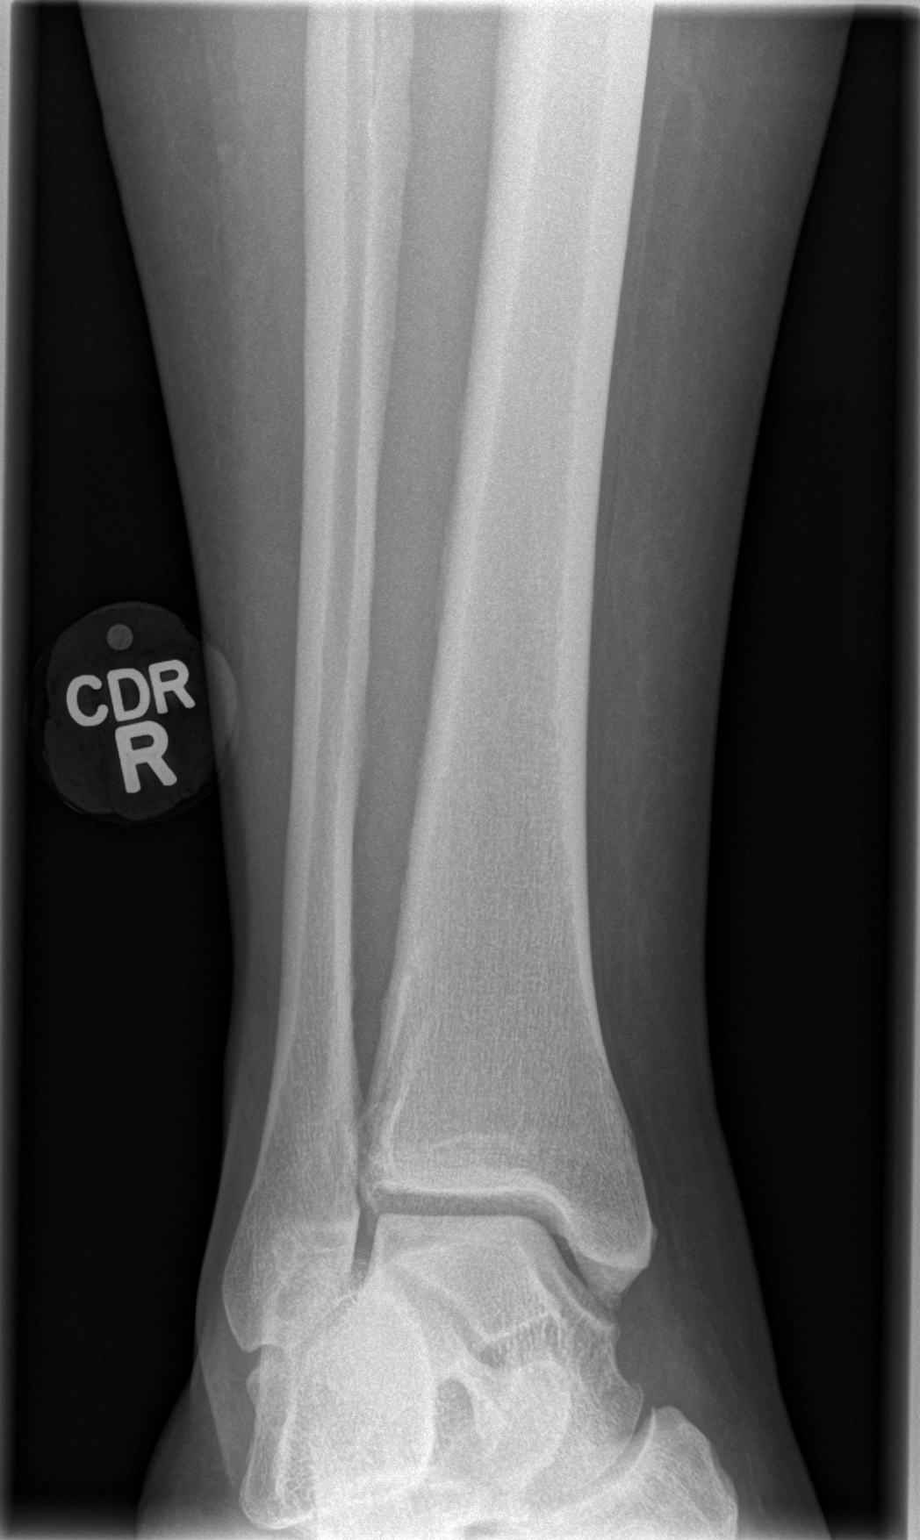

[t ankle joint lat right]
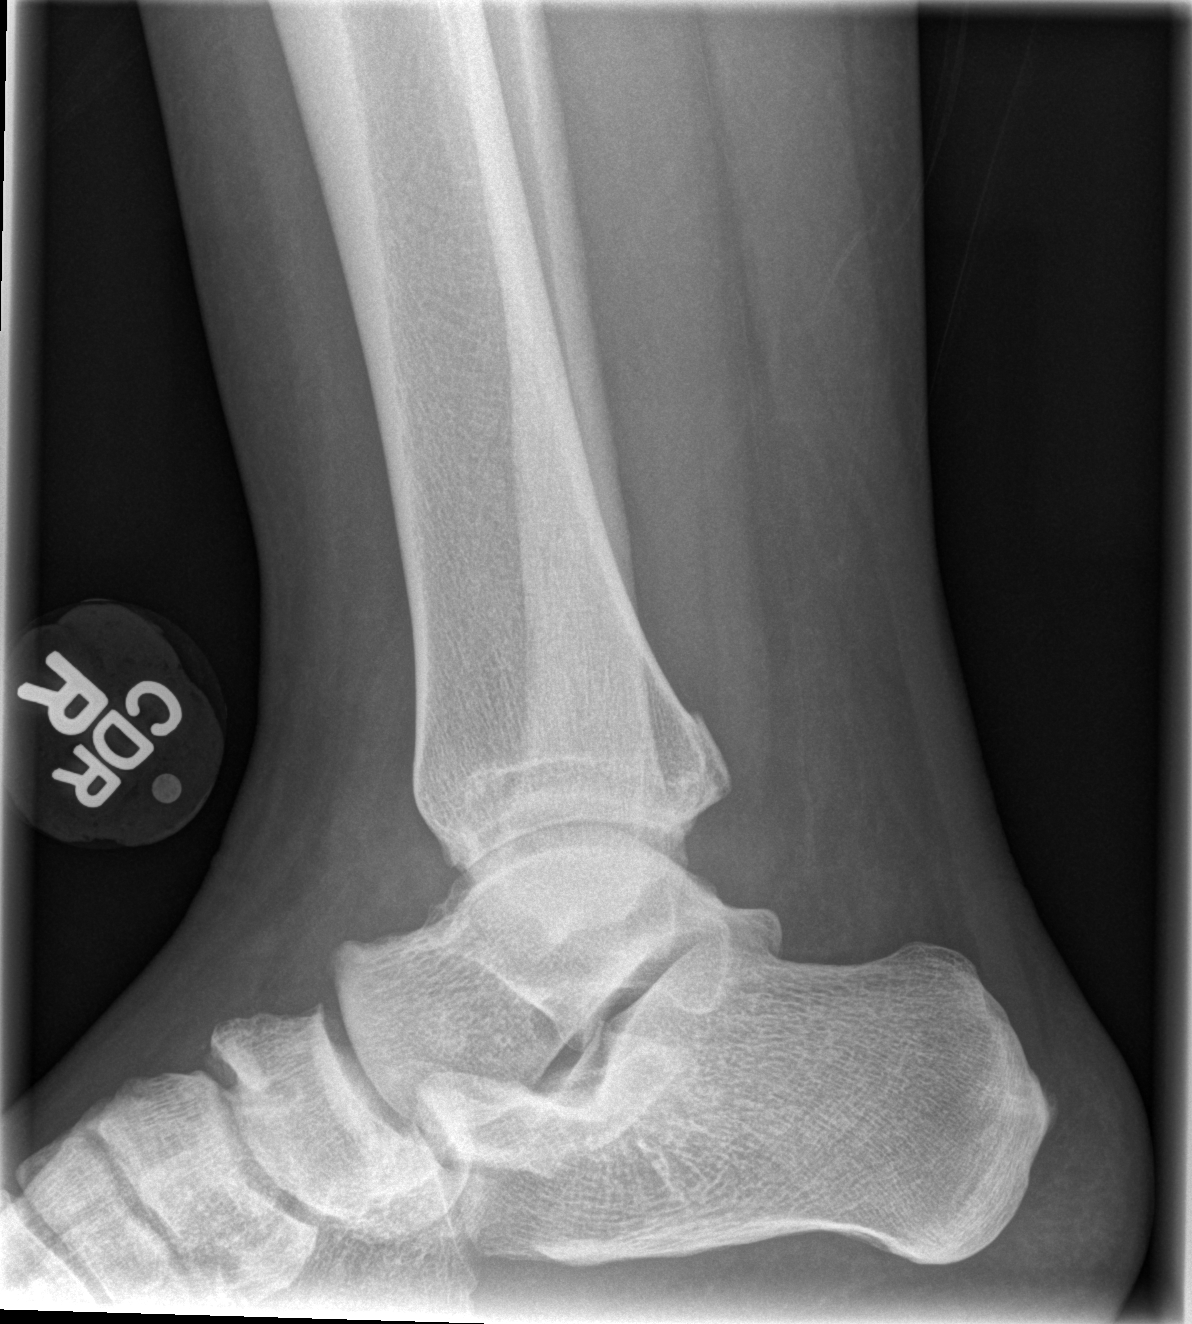

[3 of 3 positions shown; findings below may reference images not displayed]

FINDINGS: There is a cortical irregularity of the posterior malleolus of the
right ankle which may represent an old avulsion fracture. Probably
subacute tiny avulsion fracture off of the anterior tibial plafond
is also noted, better seen on the foot radiographs. Ankle mortise is
preserved.
IMPRESSION: Cortical irregularity of the posterior malleolus likely represents
healed avulsion fracture.

Tiny likely subacute avulsion fracture off of the anterior surface
of the tibial plafond also seen.

## 2018-03-23 IMAGING — CR DG FOOT COMPLETE 3+V*R*
3 series · 3 of 3 positions shown · non-contrast
Comparison: None.

CLINICAL DATA: Chronic right foot and right ankle pain.

EXAM:
RIGHT FOOT COMPLETE - 3+ VIEW

[t foot lat right]
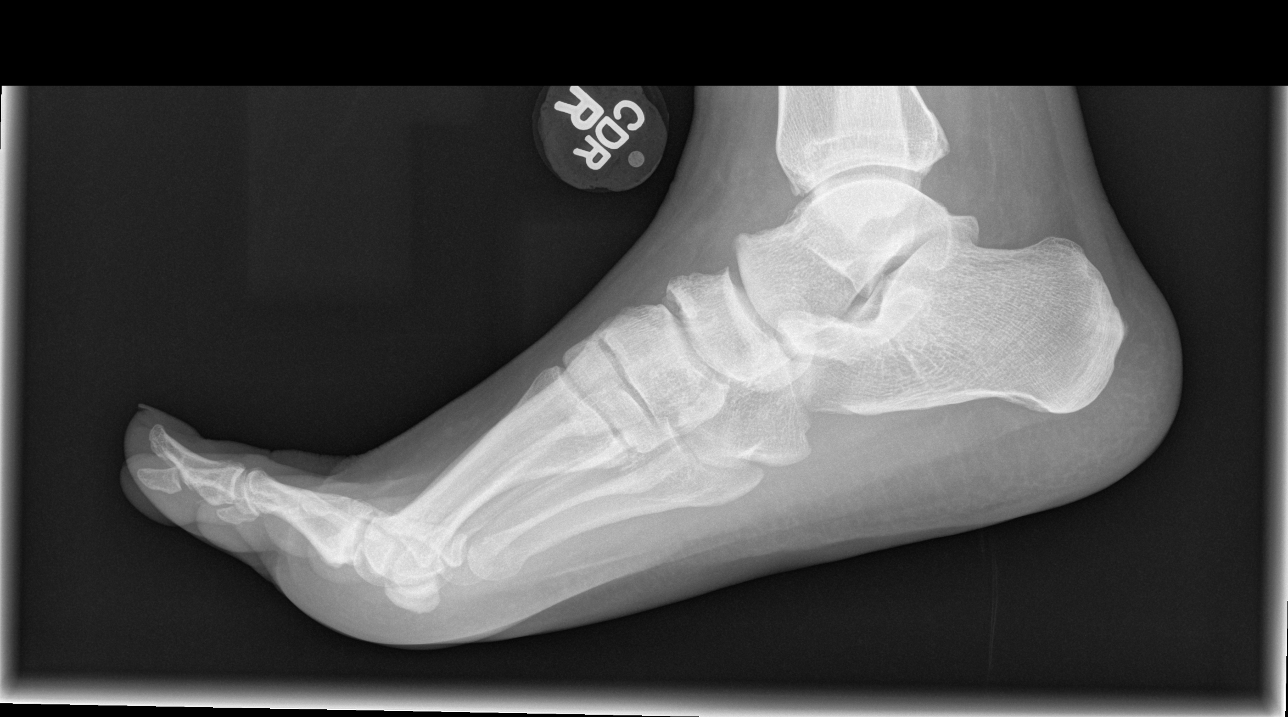

[t foot ap right]
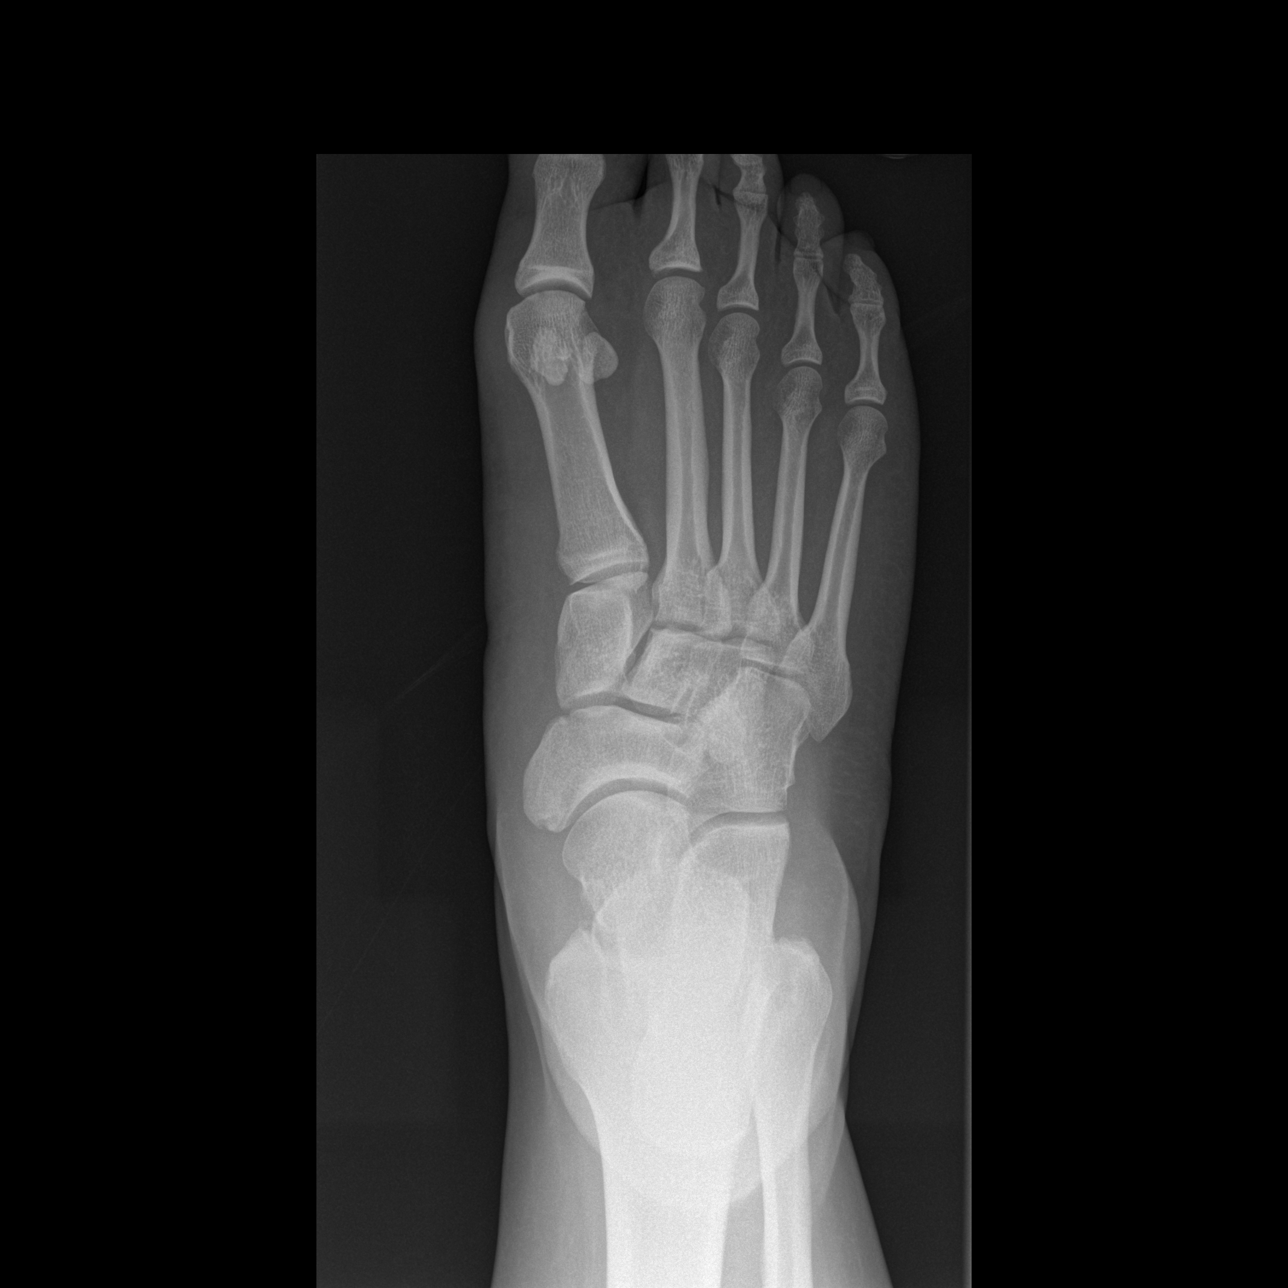

[t foot oblique right]
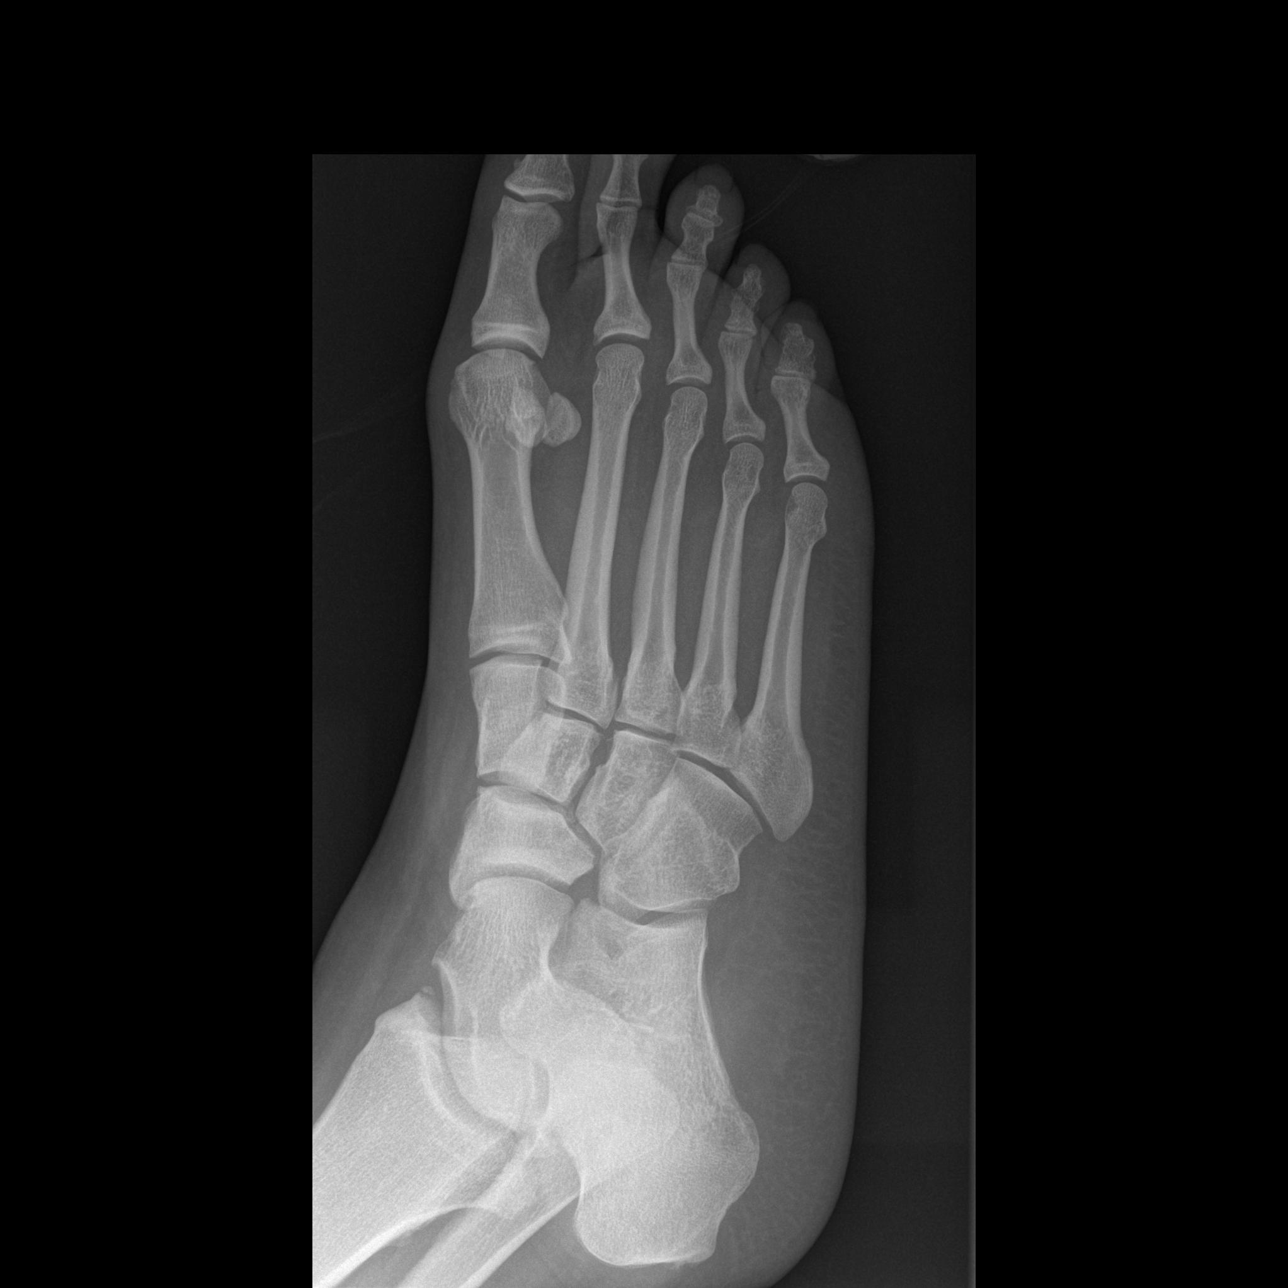

[3 of 3 positions shown; findings below may reference images not displayed]

FINDINGS: There is no evidence of fracture or dislocation of the right foot.
There is no evidence of arthropathy or other focal bone abnormality.
Tiny avulsion fracture off of the anterior surface of the tibial
plafond is noted. Soft tissues are unremarkable.
IMPRESSION: No acute fracture or dislocation identified about the right foot.

Tiny avulsion fracture off of the anterior tibial plafond.

## 2018-05-11 ENCOUNTER — Emergency Department (HOSPITAL_BASED_OUTPATIENT_CLINIC_OR_DEPARTMENT_OTHER)
Admission: EM | Admit: 2018-05-11 | Discharge: 2018-05-11 | Disposition: A | Discharge status: Home / Self Care | Payer: Medicaid Other | Attending: Emergency Medicine | Admitting: Emergency Medicine

## 2018-05-11 ENCOUNTER — Encounter (HOSPITAL_BASED_OUTPATIENT_CLINIC_OR_DEPARTMENT_OTHER): Payer: Self-pay | Admitting: Emergency Medicine

## 2018-05-11 ENCOUNTER — Other Ambulatory Visit: Payer: Self-pay

## 2018-05-11 DIAGNOSIS — H5789 Other specified disorders of eye and adnexa: Secondary | ICD-10-CM | POA: Diagnosis not present

## 2018-05-11 DIAGNOSIS — Z79899 Other long term (current) drug therapy: Secondary | ICD-10-CM | POA: Insufficient documentation

## 2018-05-11 DIAGNOSIS — H579 Unspecified disorder of eye and adnexa: Secondary | ICD-10-CM | POA: Diagnosis not present

## 2018-05-11 DIAGNOSIS — B029 Zoster without complications: Secondary | ICD-10-CM | POA: Diagnosis not present

## 2018-05-11 DIAGNOSIS — R21 Rash and other nonspecific skin eruption: Secondary | ICD-10-CM | POA: Diagnosis present

## 2018-05-11 MED ORDER — HYDROCODONE-ACETAMINOPHEN 5-325 MG PO TABS: 1.0000 | ORAL_TABLET | Freq: Four times a day (QID) | ORAL | 0 refills | Status: DC | PRN

## 2018-05-11 MED ORDER — VALACYCLOVIR HCL 1 G PO TABS: 1000.0000 mg | ORAL_TABLET | Freq: Three times a day (TID) | ORAL | 0 refills | Status: DC

## 2018-05-11 MED ORDER — FLUORESCEIN SODIUM 1 MG OP STRP
1.0000 | ORAL_STRIP | Freq: Once | OPHTHALMIC | Status: DC
  Filled 2018-05-11: qty 1

## 2018-05-11 MED ORDER — IBUPROFEN 200 MG PO TABS
600.0000 mg | ORAL_TABLET | Freq: Once | ORAL | Status: AC
  Administered 2018-05-11: 600 mg via ORAL
  Filled 2018-05-11: qty 1

## 2018-05-11 NOTE — ED Provider Notes (Signed)
[facility] EMERGENCY DEPARTMENT Provider Note   CSN: [id number] Arrival date & time: 05/11/18  1408     History   Chief Complaint Chief Complaint  Patient presents with  . Rash    HPI Melissa [name] is a 42 y.o. female with a past medical history of recurrent shingles, abnormal Pap smear, PCOS, who presents today for evaluation of a possible shingles outbreak.  She reports that she has had multiple bouts of shingles, by her estimate over 40 and says that this is a normal shingles outbreak for her.  She reports that she has had pain in the right side of her face primarily in the ear for the past few days.  She reports that she noted the rash today.  She denies any fevers.  She reports that she has been some redness to her left eye that started today.    Denies any fevers.  She states that she has not been evaluated by immunology for her frequent bouts of shingles.  HPI  Past Medical History:  Diagnosis Date  . Abnormal Pap smear 08/30/2008   LGSIL  . Allergy   . BV (bacterial vaginosis)    recurring  . Chlamydia   . Chronic pain of left knee   . Ectopic pregnancy   . Fibroid   . Infertility    Secondary PCOS  . PCOS (polycystic ovarian syndrome)   . Shingles outbreak 11/2011  . Vertigo     Patient Active Problem List   Diagnosis Date Noted  . Right ankle injury 03/01/2016  . Rash and nonspecific skin eruption 08/11/2015  . Contraceptive management 10/22/2013  . Cold 10/22/2013  . Uterine fibroids, antepartum condition or complication 40/98/1191  . Ruptured ectopic pregnancy 07/05/2011  . Obesity 12/21/2010    Past Surgical History:  Procedure Laterality Date  . DILATION AND CURETTAGE OF UTERUS  06/2011   ectopic  . EAR TUBE REMOVAL    . ECTOPIC PREGNANCY SURGERY    . LAPAROSCOPY  07/05/2011   Procedure: LAPAROSCOPY OPERATIVE;  Surgeon: Osborne Oman, MD;  Location: [facility];  Service: Gynecology;  Laterality: N/A;  . right ear surgery    .  WISDOM TOOTH EXTRACTION       OB History    Gravida  5   Para  3   Term  3   Preterm  0   AB  2   Living  3     SAB  1   TAB  0   Ectopic  1   Multiple  0   Live Births  3            Home Medications    Prior to Admission medications   Medication Sig Start Date End Date Taking? Authorizing Provider  cyclobenzaprine (FLEXERIL) 5 MG tablet Take 1 tablet (5 mg total) by mouth 3 (three) times daily as needed for muscle spasms. 12/24/17   Caroline More, DO  HYDROcodone-acetaminophen (NORCO/VICODIN) 5-325 MG tablet Take 1 tablet by mouth every 6 (six) hours as needed. 05/11/18   Lorin Glass, PA-C  hydrocortisone (ANUSOL-HC) 2.5 % rectal cream Place 1 application rectally 2 (two) times daily. 12/24/17   Curatolo, Adam, DO  OVER THE COUNTER MEDICATION Caster Oil    [provider]  penicillin v potassium (VEETID) 500 MG tablet Take 1 tablet (500 mg total) by mouth 3 (three) times daily. 02/20/18   Carlisle Cater, PA-C  polyethylene glycol (MIRALAX / GLYCOLAX) packet Take 17 g by  mouth daily. 12/24/17   Curatolo, Adam, DO  valACYclovir (VALTREX) 1000 MG tablet Take 1 tablet (1,000 mg total) by mouth 3 (three) times daily. 05/11/18   Lorin Glass, PA-C  diphenhydrAMINE (BENADRYL) 12.5 MG/5ML liquid Take 12.5 mg by mouth once.  08/01/11  [provider]    Family History Family History  Problem Relation Age of Onset  . Diabetes Mother   . Hypertension Mother   . Cancer Father   . Fibroids Sister   . Hypertension Sister   . Crohn's disease Brother   . Cancer Brother   . Diabetes Brother   . Hypertension Brother   . Kidney disease Brother   . Anesthesia problems Neg Hx     Social History Social History   Tobacco Use  . Smoking status: Never Smoker  . Smokeless tobacco: Never Used  Substance Use Topics  . Alcohol use: No  . Drug use: No     Allergies   Artichoke [cynara scolymus (artichoke)]   Review of Systems Review  of Systems  Constitutional: Negative for chills and fever.  HENT: Negative for congestion.   Eyes: Positive for redness (left eye redness. ).  Respiratory: Negative for chest tightness and shortness of breath.   Genitourinary: Negative for dysuria.  Neurological: Positive for headaches (Normal with her shingles flair). Negative for weakness, light-headedness and numbness.  All other systems reviewed and are negative.    Physical Exam Updated Vital Signs BP (!) 141/83 (BP Location: Left Arm)   Pulse 66   Temp 98.8 F (37.1 C) (Oral)   Resp 18   Ht 5\' 5"  (1.651 m)   Wt 107.5 kg   LMP 04/29/2018   SpO2 99%   BMI 39.44 kg/m   Physical Exam Vitals signs and nursing note reviewed.  Constitutional:      General: She is not in acute distress.    Appearance: She is not ill-appearing.  HENT:     Head: Normocephalic.     Comments: There are no lesions or vesicles present on the face aside from those noted on the right earlobe.    Right Ear: There is no impacted cerumen.     Left Ear: Tympanic membrane, ear canal and external ear normal. There is no impacted cerumen.     Ears:     Comments: Right ear has three vesicles on the ear lobe, and more vesicles inside the ear canal, TM is normal.  Eyes:     General: Lids are normal.     Extraocular Movements: Extraocular movements intact.     Pupils: Pupils are equal, round, and reactive to light.     Left eye: No corneal abrasion or fluorescein uptake.     Slit lamp exam:    Right eye: No photophobia.     Left eye: No photophobia.     Comments: Left eye has questionable redness.  No fluorescein uptake or dendritic stain on eye.  Cardiovascular:     Rate and Rhythm: Normal rate.  Pulmonary:     Effort: Pulmonary effort is normal. No respiratory distress.  Neurological:     General: No focal deficit present.     Mental Status: She is alert and oriented to person, place, and time.     Cranial Nerves: No cranial nerve deficit.      Coordination: Coordination is intact.     Comments: Normal gait with out ataxia.  No facial droop.       ED Treatments / Results  Labs (all labs ordered are listed, but only abnormal results are displayed) Labs Reviewed - No data to display  EKG None  Radiology No results found.  Procedures Procedures (including critical care time)  Medications Ordered in ED Medications  fluorescein ophthalmic strip 1 strip (has no administration in time range)  ibuprofen (ADVIL,MOTRIN) tablet 600 mg (600 mg Oral Given 05/11/18 1707)     Initial Impression / Assessment and Plan / ED Course  I have reviewed the triage vital signs and the nursing notes.  Pertinent labs & imaging results that were available during my care of the patient were reviewed by me and considered in my medical decision making (see chart for details).    Patient presents today for evaluation of a recurrent shingles flare.  She has had reportedly many episodes of shingles in the past.  She appears to have vesicles on her right earlobe and in the ear canal.  She does not have evidence of facial droop, Bell's palsy or ataxia.  She did report redness in the left eye fluorescein stain was performed without dendritic abnormalities or uptake.  Given her concern for shingles will give follow-up with ophthalmology.  Instructed her to call them in the morning for a follow-up appointment.    She does not have evidence of disseminated zoster.  I recommended that she obtain a primary care doctor for evaluation as it is not normal for a otherwise healthy 42 year old to have recurrent bouts of shingles, especially the 40 episodes that she estimates she has had.    Given rx for Valtrex.  Given short course of Norco to help with initial pain.  Return precautions were discussed with patient who states their understanding.  At the time of discharge patient denied any unaddressed complaints or concerns.  Patient is agreeable for discharge  home.   Final Clinical Impressions(s) / ED Diagnoses   Final diagnoses:  Herpes zoster without complication  Redness of eye, left    ED Discharge Orders         Ordered    valACYclovir (VALTREX) 1000 MG tablet  3 times daily     05/11/18 1743    HYDROcodone-acetaminophen (NORCO/VICODIN) 5-325 MG tablet  Every 6 hours PRN     05/11/18 1743           Lorin Glass, PA-C 05/11/18 2018    Deno Etienne, DO 05/12/18 [phone]

## 2018-05-11 NOTE — ED Triage Notes (Signed)
Pt states she thinks she has shingles again to her R ear. Pt reports rash and pain to ear and face for several days.

## 2018-05-11 NOTE — Discharge Instructions (Addendum)
You are being prescribed a medication which may make you sleepy. For 24 hours after one dose please do not drive, operate heavy machinery, care for a small child with out another adult present, or perform any activities that may cause harm to you or someone else if you were to fall asleep or be impaired.   Please take Ibuprofen (Advil, motrin) and Tylenol (acetaminophen) to relieve your pain.  You may take up to 600 MG (3 pills) of normal strength ibuprofen every 8 hours as needed.  In between doses of ibuprofen you make take tylenol, up to 1,000 mg (two extra strength pills).  Do not take more than 3,000 mg tylenol in a 24 hour period.  Please check all medication labels as many medications such as pain and cold medications may contain tylenol.  Do not drink alcohol while taking these medications.  Do not take other NSAID'S while taking ibuprofen (such as aleve or naproxen).  Please take ibuprofen with food to decrease stomach upset.  As we discussed today it is not normal for a 42 year old woman who is otherwise healthy to get multiple bouts of shingles.  It is important that you establish care with a primary care doctor for further evaluation.  You may need referral to an immunologist.  I have given you the information for an eye doctor, please call their office when it opens tomorrow morning for a recheck.

## 2018-06-12 ENCOUNTER — Emergency Department (HOSPITAL_BASED_OUTPATIENT_CLINIC_OR_DEPARTMENT_OTHER)
Admission: EM | Admit: 2018-06-12 | Discharge: 2018-06-12 | Disposition: A | Discharge status: Home / Self Care | Payer: Medicaid Other | Attending: Emergency Medicine | Admitting: Emergency Medicine

## 2018-06-12 ENCOUNTER — Other Ambulatory Visit: Payer: Self-pay

## 2018-06-12 ENCOUNTER — Encounter (HOSPITAL_BASED_OUTPATIENT_CLINIC_OR_DEPARTMENT_OTHER): Payer: Self-pay | Admitting: *Deleted

## 2018-06-12 DIAGNOSIS — R21 Rash and other nonspecific skin eruption: Secondary | ICD-10-CM | POA: Insufficient documentation

## 2018-06-12 DIAGNOSIS — Z79899 Other long term (current) drug therapy: Secondary | ICD-10-CM | POA: Diagnosis not present

## 2018-06-12 DIAGNOSIS — L240 Irritant contact dermatitis due to detergents: Secondary | ICD-10-CM | POA: Diagnosis not present

## 2018-06-12 NOTE — ED Provider Notes (Signed)
[facility] EMERGENCY DEPARTMENT Provider Note   CSN: [id number] Arrival date & time: 06/12/18  1437     History   Chief Complaint Chief Complaint  Patient presents with  . Rash    HPI Melissa [name] is a 43 y.o. female.  43 year old female with past medical history below who presents with rash.  1 week ago, she began having a rash that involved her face and neck and now involves her entire body.  She states that her skin is very itchy despite trying calamine and Benadryl.  She reports that she has been using a light of downy unstoppable scented detergent recently, even adding it to her dryer, because she likes the scent.  This is the only change to her household.  She denies any other new body products. No fevers or recent illness.  The history is provided by the patient.  Rash    Past Medical History:  Diagnosis Date  . Abnormal Pap smear 08/30/2008   LGSIL  . Allergy   . BV (bacterial vaginosis)    recurring  . Chlamydia   . Chronic pain of left knee   . Ectopic pregnancy   . Fibroid   . Infertility    Secondary PCOS  . PCOS (polycystic ovarian syndrome)   . Shingles outbreak 11/2011  . Vertigo     Patient Active Problem List   Diagnosis Date Noted  . Right ankle injury 03/01/2016  . Rash and nonspecific skin eruption 08/11/2015  . Contraceptive management 10/22/2013  . Cold 10/22/2013  . Uterine fibroids, antepartum condition or complication 97/98/9211  . Ruptured ectopic pregnancy 07/05/2011  . Obesity 12/21/2010    Past Surgical History:  Procedure Laterality Date  . DILATION AND CURETTAGE OF UTERUS  06/2011   ectopic  . EAR TUBE REMOVAL    . ECTOPIC PREGNANCY SURGERY    . LAPAROSCOPY  07/05/2011   Procedure: LAPAROSCOPY OPERATIVE;  Surgeon: Melissa Oman, MD;  Location: [facility];  Service: Gynecology;  Laterality: N/A;  . right ear surgery    . WISDOM TOOTH EXTRACTION       OB History    Gravida  5   Para  3   Term  3   Preterm   0   AB  2   Living  3     SAB  1   TAB  0   Ectopic  1   Multiple  0   Live Births  3            Home Medications    Prior to Admission medications   Medication Sig Start Date End Date Taking? Authorizing Provider  cyclobenzaprine (FLEXERIL) 5 MG tablet Take 1 tablet (5 mg total) by mouth 3 (three) times daily as needed for muscle spasms. 12/24/17   Melissa More, DO  HYDROcodone-acetaminophen (NORCO/VICODIN) 5-325 MG tablet Take 1 tablet by mouth every 6 (six) hours as needed. 05/11/18   Melissa [name]  hydrocortisone (ANUSOL-HC) 2.5 % rectal cream Place 1 application rectally 2 (two) times daily. 12/24/17   [name], Adam, DO  OVER THE COUNTER MEDICATION Caster Oil    [provider]  penicillin v potassium (VEETID) 500 MG tablet Take 1 tablet (500 mg total) by mouth 3 (three) times daily. 02/20/18   Melissa Cater, [name]  polyethylene glycol (MIRALAX / GLYCOLAX) packet Take 17 g by mouth daily. 12/24/17   [name], Adam, DO  valACYclovir (VALTREX) 1000 MG tablet Take 1 tablet (1,000 mg total)  by mouth 3 (three) times daily. 05/11/18   Melissa [name]  diphenhydrAMINE (BENADRYL) 12.5 MG/5ML liquid Take 12.5 mg by mouth once.  08/01/11  [provider]    Family History Family History  Problem Relation Age of Onset  . Diabetes Mother   . Hypertension Mother   . Cancer Father   . Fibroids Sister   . Hypertension Sister   . Crohn's disease Brother   . Cancer Brother   . Diabetes Brother   . Hypertension Brother   . Kidney disease Brother   . Anesthesia problems Neg Hx     Social History Social History   Tobacco Use  . Smoking status: Never Smoker  . Smokeless tobacco: Never Used  Substance Use Topics  . Alcohol use: No  . Drug use: No     Allergies   Artichoke Adela Glimpse scolymus (artichoke)]   Review of Systems Review of Systems  Skin: Positive for rash.   All other systems reviewed and are negative except  that which was mentioned in HPI   Physical Exam Updated Vital Signs BP 129/68 (BP Location: Right Arm)   Pulse 66   Temp 98.4 F (36.9 C) (Oral)   Resp 20   Ht 5\' 5"  (1.651 m)   Wt 112 kg   LMP 06/10/2018   SpO2 100%   BMI 41.10 kg/m   Physical Exam Vitals signs and nursing note reviewed.  Constitutional:      General: She is not in acute distress.    Appearance: She is well-developed.  HENT:     Head: Normocephalic and atraumatic.     Right Ear: Tympanic membrane and ear canal normal.     Left Ear: Tympanic membrane and ear canal normal.     Mouth/Throat:     Mouth: Mucous membranes are moist.     Pharynx: Oropharynx is clear.  Eyes:     Conjunctiva/sclera: Conjunctivae normal.  Neck:     Musculoskeletal: Neck supple.  Musculoskeletal:        General: No swelling or tenderness.  Skin:    General: Skin is warm and dry.     Comments: Scattered areas of scaling skin and skin dryness around neck, skin folds at elbows, R earlobe  Neurological:     Mental Status: She is alert and oriented to person, place, and time.  Psychiatric:        Judgment: Judgment normal.      ED Treatments / Results  Labs (all labs ordered are listed, but only abnormal results are displayed) Labs Reviewed - No data to display  EKG None  Radiology No results found.  Procedures Procedures (including critical care time)  Medications Ordered in ED Medications - No data to display   Initial Impression / Assessment and Plan / ED Course  I have reviewed the triage vital signs and the nursing notes.     I don't actually appreciate any hives, macules, or prominent rash on exam although patient is actively scratching. She has a few areas of dry skin that may represent atopic dermatitis and it is possible this has been exacerbated by overuse of scented detergent.  Discussed supportive measures and discontinuation of this detergent.  Final Clinical Impressions(s) / ED Diagnoses   Final  diagnoses:  Rash and nonspecific skin eruption  Contact dermatitis due to detergent, unspecified contact dermatitis type    ED Discharge Orders    None       Cassidee Deats, Wenda Overland, MD 06/12/18 [phone]

## 2018-06-12 NOTE — Discharge Instructions (Addendum)
Apply thick lotion such as EUCERIN ECZEMA or AVEENO to your body twice daily. Take Allegra once daily. Take benadryl as needed for itching. Stop using detergent DOWNY UNSTOPPABLES and wash any clothes or bedding that were exposed to it.

## 2018-06-12 NOTE — ED Triage Notes (Signed)
Pt c/o rash to entire body x 1 week

## 2018-06-18 ENCOUNTER — Ambulatory Visit: Payer: Medicaid Other | Admitting: Obstetrics and Gynecology

## 2018-07-07 ENCOUNTER — Other Ambulatory Visit: Payer: Self-pay

## 2018-07-07 ENCOUNTER — Emergency Department (HOSPITAL_BASED_OUTPATIENT_CLINIC_OR_DEPARTMENT_OTHER)
Admission: EM | Admit: 2018-07-07 | Discharge: 2018-07-07 | Disposition: A | Discharge status: Home / Self Care | Payer: Medicaid Other | Attending: Emergency Medicine | Admitting: Emergency Medicine

## 2018-07-07 ENCOUNTER — Encounter (HOSPITAL_BASED_OUTPATIENT_CLINIC_OR_DEPARTMENT_OTHER): Payer: Self-pay | Admitting: *Deleted

## 2018-07-07 DIAGNOSIS — R1110 Vomiting, unspecified: Secondary | ICD-10-CM | POA: Diagnosis present

## 2018-07-07 DIAGNOSIS — R112 Nausea with vomiting, unspecified: Secondary | ICD-10-CM | POA: Diagnosis not present

## 2018-07-07 LAB — URINALYSIS, ROUTINE W REFLEX MICROSCOPIC
Bilirubin Urine: NEGATIVE
Glucose, UA: NEGATIVE mg/dL
Ketones, ur: NEGATIVE mg/dL
LEUKOCYTE UA: NEGATIVE
Nitrite: NEGATIVE
PROTEIN: NEGATIVE mg/dL
Specific Gravity, Urine: 1.02 (ref 1.005–1.030)
pH: 6.5 (ref 5.0–8.0)

## 2018-07-07 LAB — PREGNANCY, URINE: PREG TEST UR: NEGATIVE

## 2018-07-07 LAB — URINALYSIS, MICROSCOPIC (REFLEX)

## 2018-07-07 MED ORDER — ONDANSETRON 4 MG PO TBDP
4.0000 mg | ORAL_TABLET | Freq: Once | ORAL | Status: AC
  Administered 2018-07-07: 4 mg via ORAL
  Filled 2018-07-07: qty 1

## 2018-07-07 MED ORDER — ONDANSETRON 4 MG PO TBDP: 4.0000 mg | ORAL_TABLET | Freq: Three times a day (TID) | ORAL | 0 refills | Status: DC | PRN

## 2018-07-07 NOTE — ED Notes (Signed)
PO Challenge started

## 2018-07-07 NOTE — ED Triage Notes (Signed)
Abdominal pain. Vomiting today. No diarrhea.

## 2018-07-07 NOTE — Discharge Instructions (Signed)

## 2018-07-07 NOTE — ED Provider Notes (Signed)
Emergency Department Provider Note   I have reviewed the triage vital signs and the nursing notes.   HISTORY  Chief Complaint Emesis   HPI Melissa [name] is a 43 y.o. female presents to the emergency department for evaluation of acute onset nausea and vomiting.  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  She had felt sudden onset diaphoresis with abdominal cramping and nausea.  She ran in the classroom and had one episode of vomiting with cough.  No choking or shortness of breath.  She denies any chest pain, shortness of breath, heart palpitations with the episode.  She has not had additional vomiting or diarrhea.  She was told by her daughter that some of the sandwich meat left on the counter, which she ate, was several weeks old.  Fevers or chills.  No residual abdominal pain.  No UTI symptoms.   Past Medical History:  Diagnosis Date  . Abnormal Pap smear 08/30/2008   LGSIL  . Allergy   . BV (bacterial vaginosis)    recurring  . Chlamydia   . Chronic pain of left knee   . Ectopic pregnancy   . Fibroid   . Infertility    Secondary PCOS  . PCOS (polycystic ovarian syndrome)   . Shingles outbreak 11/2011  . Vertigo     Patient Active Problem List   Diagnosis Date Noted  . Right ankle injury 03/01/2016  . Rash and nonspecific skin eruption 08/11/2015  . Contraceptive management 10/22/2013  . Cold 10/22/2013  . Uterine fibroids, antepartum condition or complication 45/80/9983  . Ruptured ectopic pregnancy 07/05/2011  . Obesity 12/21/2010    Past Surgical History:  Procedure Laterality Date  . DILATION AND CURETTAGE OF UTERUS  06/2011   ectopic  . EAR TUBE REMOVAL    . ECTOPIC PREGNANCY SURGERY    . LAPAROSCOPY  07/05/2011   Procedure: LAPAROSCOPY OPERATIVE;  Surgeon: Osborne Oman, MD;  Location: [facility];  Service: Gynecology;  Laterality: N/A;  . right ear surgery    . WISDOM TOOTH EXTRACTION      Allergies Artichoke Adela Glimpse scolymus  (artichoke)]  Family History  Problem Relation Age of Onset  . Diabetes Mother   . Hypertension Mother   . Cancer Father   . Fibroids Sister   . Hypertension Sister   . Crohn's disease Brother   . Cancer Brother   . Diabetes Brother   . Hypertension Brother   . Kidney disease Brother   . Anesthesia problems Neg Hx     Social History Social History   Tobacco Use  . Smoking status: Never Smoker  . Smokeless tobacco: Never Used  Substance Use Topics  . Alcohol use: No  . Drug use: No    Review of Systems  Constitutional: No fever/chills Eyes: No visual changes. ENT: No sore throat. Cardiovascular: Denies chest pain. Respiratory: Denies shortness of breath. Gastrointestinal: Positive abdominal cramping pain. Positive nausea and vomiting.  No diarrhea.  No constipation. Genitourinary: Negative for dysuria. Musculoskeletal: Negative for back pain. Skin: Negative for rash. Neurological: Negative for headaches, focal weakness or numbness.  10-point ROS otherwise negative.  ____________________________________________   PHYSICAL EXAM:  VITAL SIGNS: ED Triage Vitals  Enc Vitals Group     BP 07/07/18 1834 137/80     Pulse Rate 07/07/18 1834 80     Resp 07/07/18 1834 14     Temp 07/07/18 1834 98 F (36.7 C)     Temp Source 07/07/18 1834 Oral  SpO2 07/07/18 1834 100 %     Weight 07/07/18 1833 246 lb 14.6 oz (112 kg)     Height 07/07/18 1833 5\' 5"  (1.651 m)     Pain Score 07/07/18 1832 7    Constitutional: Alert and oriented. Well appearing and in no acute distress. Eyes: Conjunctivae are normal. Head: Atraumatic. Nose: No congestion/rhinnorhea. Mouth/Throat: Mucous membranes are moist.  Neck: No stridor.  Cardiovascular: Normal rate, regular rhythm. Good peripheral circulation. Grossly normal heart sounds.   Respiratory: Normal respiratory effort.  No retractions. Lungs CTAB. Gastrointestinal: Soft and nontender. No distention.  Musculoskeletal: No lower  extremity tenderness nor edema. No gross deformities of extremities. Neurologic:  Normal speech and language. No gross focal neurologic deficits are appreciated.  Skin:  Skin is warm, dry and intact. No rash noted.  ____________________________________________   LABS (all labs ordered are listed, but only abnormal results are displayed)  Labs Reviewed  URINALYSIS, ROUTINE W REFLEX MICROSCOPIC - Abnormal; Notable for the following components:      Result Value   Hgb urine dipstick LARGE (*)    All other components within normal limits  URINALYSIS, MICROSCOPIC (REFLEX) - Abnormal; Notable for the following components:   Bacteria, UA RARE (*)    All other components within normal limits  PREGNANCY, URINE   ____________________________________________  RADIOLOGY  none ____________________________________________   PROCEDURES  Procedure(s) performed:   Procedures  none ____________________________________________   INITIAL IMPRESSION / ASSESSMENT AND PLAN / ED COURSE  Pertinent labs & imaging results that were available during my care of the patient were reviewed by me and considered in my medical decision making (see chart for details).  She presents to the emergency department for evaluation of nausea and vomiting.  Abdominal exam is unremarkable.  Patient is well-appearing with normal vital signs.  Urinalysis was reviewed with no acute findings.  She was given ODT Zofran and feeling improved.  She is tolerating PO. Suspect viral etiology. Very low suspicion for arrhythmia or other cardiorespiratory event.   Discussed ED return precautions in detail.  ____________________________________________  FINAL CLINICAL IMPRESSION(S) / ED DIAGNOSES  Final diagnoses:  Non-intractable vomiting with nausea, unspecified vomiting type     MEDICATIONS GIVEN DURING THIS VISIT:  Medications  ondansetron (ZOFRAN-ODT) disintegrating tablet 4 mg (4 mg Oral Given 07/07/18 2014)      NEW OUTPATIENT MEDICATIONS STARTED DURING THIS VISIT:  Discharge Medication List as of 07/07/2018  8:42 PM    START taking these medications   Details  ondansetron (ZOFRAN ODT) 4 MG disintegrating tablet Take 1 tablet (4 mg total) by mouth every 8 (eight) hours as needed for nausea or vomiting., Starting Mon 07/07/2018, Print        Note:  This document was prepared using Dragon voice recognition software and may include unintentional dictation errors.  Nanda Quinton, MD Emergency Medicine    Fey Coghill, Wonda [facility], MD 07/08/18 [phone]

## 2018-07-21 ENCOUNTER — Encounter (HOSPITAL_BASED_OUTPATIENT_CLINIC_OR_DEPARTMENT_OTHER): Payer: Self-pay | Admitting: Emergency Medicine

## 2018-07-21 ENCOUNTER — Other Ambulatory Visit: Payer: Self-pay

## 2018-07-21 ENCOUNTER — Emergency Department (HOSPITAL_BASED_OUTPATIENT_CLINIC_OR_DEPARTMENT_OTHER)
Admission: EM | Admit: 2018-07-21 | Discharge: 2018-07-21 | Disposition: A | Discharge status: Home / Self Care | Payer: Medicaid Other | Attending: Emergency Medicine | Admitting: Emergency Medicine

## 2018-07-21 DIAGNOSIS — R05 Cough: Secondary | ICD-10-CM | POA: Insufficient documentation

## 2018-07-21 DIAGNOSIS — H9201 Otalgia, right ear: Secondary | ICD-10-CM | POA: Diagnosis not present

## 2018-07-21 DIAGNOSIS — G501 Atypical facial pain: Secondary | ICD-10-CM | POA: Diagnosis not present

## 2018-07-21 DIAGNOSIS — H6691 Otitis media, unspecified, right ear: Secondary | ICD-10-CM | POA: Diagnosis not present

## 2018-07-21 DIAGNOSIS — J0100 Acute maxillary sinusitis, unspecified: Secondary | ICD-10-CM | POA: Diagnosis not present

## 2018-07-21 DIAGNOSIS — R0981 Nasal congestion: Secondary | ICD-10-CM | POA: Diagnosis present

## 2018-07-21 DIAGNOSIS — H6690 Otitis media, unspecified, unspecified ear: Secondary | ICD-10-CM

## 2018-07-21 MED ORDER — AMOXICILLIN-POT CLAVULANATE 875-125 MG PO TABS: 1.0000 | ORAL_TABLET | Freq: Two times a day (BID) | ORAL | 0 refills | Status: AC

## 2018-07-21 NOTE — ED Triage Notes (Signed)
Sneezing and runny nose x2 weeks.  Denies sore throat, fever, body aches or cough.

## 2018-07-21 NOTE — Discharge Instructions (Signed)
Take the antibiotics as prescribed and return to the ED with any new or worsening symptoms.

## 2018-07-21 NOTE — ED Provider Notes (Signed)
Emergency Department Provider Note   I have reviewed the triage vital signs and the nursing notes.   HISTORY  Chief Complaint Nasal Congestion   HPI Melissa [name] is a 43 y.o. female presents to the emergency department for evaluation of nasal congestion, sneezing, runny nose, right ear pain.  Patient has had chronic issues with her right ear and notes that since this has started her ear pain has been worse.  She denies any shortness of breath, fever, body aches, coughing.  She has tried multiple over-the-counter and prescription medications for her congestion and sinus symptoms but no relief.  She reports facial pain.  No vision changes.  No headache or neck discomfort.  No radiation of symptoms or modifying factors.  Past Medical History:  Diagnosis Date  . Abnormal Pap smear 08/30/2008   LGSIL  . Allergy   . BV (bacterial vaginosis)    recurring  . Chlamydia   . Chronic pain of left knee   . Ectopic pregnancy   . Fibroid   . Infertility    Secondary PCOS  . PCOS (polycystic ovarian syndrome)   . Shingles outbreak 11/2011  . Vertigo     Patient Active Problem List   Diagnosis Date Noted  . Right ankle injury 03/01/2016  . Rash and nonspecific skin eruption 08/11/2015  . Contraceptive management 10/22/2013  . Cold 10/22/2013  . Uterine fibroids, antepartum condition or complication 62/56/3893  . Ruptured ectopic pregnancy 07/05/2011  . Obesity 12/21/2010    Past Surgical History:  Procedure Laterality Date  . DILATION AND CURETTAGE OF UTERUS  06/2011   ectopic  . EAR TUBE REMOVAL    . ECTOPIC PREGNANCY SURGERY    . LAPAROSCOPY  07/05/2011   Procedure: LAPAROSCOPY OPERATIVE;  Surgeon: Osborne Oman, MD;  Location: [facility];  Service: Gynecology;  Laterality: N/A;  . right ear surgery    . WISDOM TOOTH EXTRACTION        Allergies Artichoke Adela Glimpse scolymus (artichoke)]  Family History  Problem Relation Age of Onset  . Diabetes Mother   .  Hypertension Mother   . Cancer Father   . Fibroids Sister   . Hypertension Sister   . Crohn's disease Brother   . Cancer Brother   . Diabetes Brother   . Hypertension Brother   . Kidney disease Brother   . Anesthesia problems Neg Hx     Social History Social History   Tobacco Use  . Smoking status: Never Smoker  . Smokeless tobacco: Never Used  Substance Use Topics  . Alcohol use: No  . Drug use: No    Review of Systems  Constitutional: No fever/chills Eyes: No visual changes. ENT: No sore throat. Positive congestion.  Cardiovascular: Denies chest pain. Respiratory: Denies shortness of breath. Gastrointestinal: No abdominal pain.  No nausea, no vomiting.  No diarrhea.  No constipation. Genitourinary: Negative for dysuria. Musculoskeletal: Negative for back pain. Skin: Negative for rash. Neurological: Negative for headaches, focal weakness or numbness.  10-point ROS otherwise negative.  ____________________________________________   PHYSICAL EXAM:  VITAL SIGNS: ED Triage Vitals  Enc Vitals Group     BP 07/21/18 0829 137/73     Pulse Rate 07/21/18 0829 71     Resp 07/21/18 0829 16     Temp 07/21/18 0829 97.8 F (36.6 C)     Temp Source 07/21/18 0829 Oral     SpO2 07/21/18 0829 100 %     Weight 07/21/18 0829 236 lb (107 kg)  Height 07/21/18 0829 5\' 5"  (1.651 m)   Constitutional: Alert and oriented. Well appearing and in no acute distress. Eyes: Conjunctivae are normal.  Head: Atraumatic. Ears:  Healthy appearing ear canals bilaterally. Erythema of the right TM noted.  Nose: Positive congestion/rhinnorhea. Mouth/Throat: Mucous membranes are moist.  Oropharynx non-erythematous. Neck: No stridor.  Cardiovascular: Normal rate, regular rhythm. Good peripheral circulation. Grossly normal heart sounds.   Respiratory: Normal respiratory effort.  No retractions. Lungs CTAB. Gastrointestinal: No distention.  Musculoskeletal: No lower extremity tenderness nor  edema. No gross deformities of extremities. Neurologic:  Normal speech and language. Skin:  Skin is warm, dry and intact. No rash noted.  ____________________________________________  RADIOLOGY  None ____________________________________________   PROCEDURES  Procedure(s) performed:   Procedures  None ____________________________________________   INITIAL IMPRESSION / ASSESSMENT AND PLAN / ED COURSE  Pertinent labs & imaging results that were available during my care of the patient were reviewed by me and considered in my medical decision making (see chart for details).  Patient presents to the emergency department for evaluation of sneezing, runny nose, right ear pain.  She has sinus tenderness on exam and erythema with effusion behind the right TM.  Stable otitis media.  Plan to cover with Augmentin and continue over-the-counter medications for congestion symptoms.  No shortness of breath, hypoxemia, other concern for pneumonia.  Patient has no international travel history or flulike symptoms.   ____________________________________________  FINAL CLINICAL IMPRESSION(S) / ED DIAGNOSES  Final diagnoses:  Acute otitis media, unspecified otitis media type  Acute maxillary sinusitis, recurrence not specified     NEW OUTPATIENT MEDICATIONS STARTED DURING THIS VISIT:  Discharge Medication List as of 07/21/2018  8:52 AM    START taking these medications   Details  amoxicillin-clavulanate (AUGMENTIN) 875-125 MG tablet Take 1 tablet by mouth every 12 (twelve) hours for 7 days., Starting Mon 07/21/2018, Until Mon 07/28/2018, Print        Note:  This document was prepared using Dragon voice recognition software and may include unintentional dictation errors.  Nanda Quinton, MD Emergency Medicine    Long, Wonda Olds, MD 07/21/18 2007

## 2018-09-07 ENCOUNTER — Other Ambulatory Visit: Payer: Self-pay

## 2018-09-07 ENCOUNTER — Emergency Department (HOSPITAL_BASED_OUTPATIENT_CLINIC_OR_DEPARTMENT_OTHER)
Admission: EM | Admit: 2018-09-07 | Discharge: 2018-09-07 | Disposition: A | Discharge status: Home / Self Care | Payer: Medicaid Other | Attending: Emergency Medicine | Admitting: Emergency Medicine

## 2018-09-07 ENCOUNTER — Encounter (HOSPITAL_BASED_OUTPATIENT_CLINIC_OR_DEPARTMENT_OTHER): Payer: Self-pay | Admitting: *Deleted

## 2018-09-07 DIAGNOSIS — H9201 Otalgia, right ear: Secondary | ICD-10-CM

## 2018-09-07 MED ORDER — ONDANSETRON 4 MG PO TBDP: 4.0000 mg | ORAL_TABLET | Freq: Three times a day (TID) | ORAL | 0 refills | Status: DC | PRN

## 2018-09-07 MED ORDER — AMOXICILLIN-POT CLAVULANATE 875-125 MG PO TABS: 1.0000 | ORAL_TABLET | Freq: Two times a day (BID) | ORAL | 0 refills | Status: DC

## 2018-09-07 MED ORDER — VALACYCLOVIR HCL 1 G PO TABS: 1000.0000 mg | ORAL_TABLET | Freq: Three times a day (TID) | ORAL | 0 refills | Status: DC

## 2018-09-07 NOTE — ED Triage Notes (Signed)
Pt reports right ear pain x 1 week. States this is a recurrent problem and she has had middle ear problems since then third grade. Pt also reports she has had shingles in that ear

## 2018-09-07 NOTE — ED Notes (Addendum)
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 Pt c/o headache, and ear pain and pressure. Pt denies fever, states she feels off balance and nauseated.

## 2018-09-07 NOTE — ED Notes (Signed)
Pt verbalized understanding of discharge instructions.

## 2018-09-07 NOTE — Discharge Instructions (Signed)
Please read and follow all provided instructions.  Your diagnoses today include:  1. Right ear pain     Tests performed today include:  Vital signs. See below for your results today.   Medications prescribed:   Augmentin - antibiotic  You have been prescribed an antibiotic medicine: take the entire course of medicine even if you are feeling better. Stopping early can cause the antibiotic not to work.   Zofran (ondansetron) - for nausea and vomiting   Valtrex -antiviral medication  Take any prescribed medications only as directed.  Home care instructions:  Follow any educational materials contained in this packet.  BE VERY CAREFUL not to take multiple medicines containing Tylenol (also called acetaminophen). Doing so can lead to an overdose which can damage your liver and cause liver failure and possibly death.   Follow-up instructions: Please follow-up with the ENT doctor listed for further advice regarding your chronic ear symptoms.   Return instructions:   Please return to the Emergency Department if you experience worsening symptoms.   Please return if you have any other emergent concerns.  Additional Information:  Your vital signs today were: BP (!) 141/90 (BP Location: Right Arm)    Pulse 77    Temp 98.4 F (36.9 C) (Oral)    Resp 16    Ht 5\' 4"  (1.626 m)    Wt 111.6 kg    LMP 09/01/2018    SpO2 100%    BMI 42.23 kg/m  If your blood pressure (BP) was elevated above 135/85 this visit, please have this repeated by your doctor within one month. --------------

## 2018-09-07 NOTE — ED Notes (Signed)
ED Provider at bedside. 

## 2018-09-07 NOTE — ED Provider Notes (Signed)
[facility] EMERGENCY DEPARTMENT Provider Note   CSN: [id number] Arrival date & time: 09/07/18  1850    History   Chief Complaint Chief Complaint  Patient presents with  . Otalgia    HPI Melissa [name] is a 43 y.o. female.     Patient with history of recurrent right ear infections presents the emergency department today with 1 day of recurrent symptoms.  She states that she has had shingles affect that ear in the past.  Her symptoms today started as a headache that was unresponsive to Tylenol and ibuprofen.  This progressed to a sense of vertigo and nausea with near vomiting.  She then developed severe pain in her right ear.  The symptoms and progression are similar to symptoms she has had many times in the past.  Typically she is treated with either antibiotics, Valtrex, or both.  She states that she has seen an ENT doctor in the past but is not currently under the care of 1.  She denies fever, vision changes.  She has not developed a rash on her face to this point --but she states that she has developed this in the past.  She stated that she wanted to come in early for symptoms so she did not have to miss any work.     Past Medical History:  Diagnosis Date  . Abnormal Pap smear 08/30/2008   LGSIL  . Allergy   . BV (bacterial vaginosis)    recurring  . Chlamydia   . Chronic pain of left knee   . Ectopic pregnancy   . Fibroid   . Infertility    Secondary PCOS  . PCOS (polycystic ovarian syndrome)   . Shingles outbreak 11/2011  . Vertigo     Patient Active Problem List   Diagnosis Date Noted  . Right ankle injury 03/01/2016  . Rash and nonspecific skin eruption 08/11/2015  . Contraceptive management 10/22/2013  . Cold 10/22/2013  . Uterine fibroids, antepartum condition or complication 37/16/9678  . Ruptured ectopic pregnancy 07/05/2011  . Obesity 12/21/2010    Past Surgical History:  Procedure Laterality Date  . DILATION AND CURETTAGE OF UTERUS   06/2011   ectopic  . EAR TUBE REMOVAL    . ECTOPIC PREGNANCY SURGERY    . LAPAROSCOPY  07/05/2011   Procedure: LAPAROSCOPY OPERATIVE;  Surgeon: Osborne Oman, MD;  Location: [facility];  Service: Gynecology;  Laterality: N/A;  . right ear surgery    . WISDOM TOOTH EXTRACTION       OB History    Gravida  5   Para  3   Term  3   Preterm  0   AB  2   Living  3     SAB  1   TAB  0   Ectopic  1   Multiple  0   Live Births  3            Home Medications    Prior to Admission medications   Medication Sig Start Date End Date Taking? Authorizing Provider  amoxicillin-clavulanate (AUGMENTIN) 875-125 MG tablet Take 1 tablet by mouth every 12 (twelve) hours. 09/07/18   Carlisle Cater, PA-C  Multiple Vitamins-Minerals (MULTIVITAMIN WITH MINERALS) tablet Take by mouth.    [provider]  ondansetron (ZOFRAN ODT) 4 MG disintegrating tablet Take 1 tablet (4 mg total) by mouth every 8 (eight) hours as needed for nausea or vomiting. 09/07/18   Carlisle Cater, PA-C  valACYclovir (VALTREX) 1000 MG  tablet Take 1 tablet (1,000 mg total) by mouth 3 (three) times daily. 09/07/18   Carlisle Cater, PA-C  diphenhydrAMINE (BENADRYL) 12.5 MG/5ML liquid Take 12.5 mg by mouth once.  08/01/11  [provider]    Family History Family History  Problem Relation Age of Onset  . Diabetes Mother   . Hypertension Mother   . Cancer Father   . Fibroids Sister   . Hypertension Sister   . Crohn's disease Brother   . Cancer Brother   . Diabetes Brother   . Hypertension Brother   . Kidney disease Brother   . Anesthesia problems Neg Hx     Social History Social History   Tobacco Use  . Smoking status: Never Smoker  . Smokeless tobacco: Never Used  Substance Use Topics  . Alcohol use: No  . Drug use: No     Allergies   Artichoke [cynara scolymus (artichoke)]   Review of Systems Review of Systems  Constitutional: Negative for chills, fatigue and fever.  HENT:  Positive for ear pain. Negative for congestion, ear discharge, rhinorrhea, sinus pressure and sore throat.   Eyes: Negative for redness.  Respiratory: Negative for cough and wheezing.   Gastrointestinal: Negative for abdominal pain, diarrhea, nausea and vomiting.  Musculoskeletal: Negative for myalgias and neck stiffness.  Skin: Negative for rash.  Neurological: Positive for dizziness and headaches.  Hematological: Negative for adenopathy.     Physical Exam Updated Vital Signs BP (!) 141/90 (BP Location: Right Arm)   Pulse 77   Temp 98.4 F (36.9 C) (Oral)   Resp 16   Ht 5\' 4"  (1.626 m)   Wt 111.6 kg   LMP 09/01/2018   SpO2 100%   BMI 42.23 kg/m   Physical Exam Vitals signs and nursing note reviewed.  Constitutional:      Appearance: She is well-developed.  HENT:     Head: Normocephalic and atraumatic.     Right Ear: External ear normal.     Left Ear: External ear normal.     Ears:     Comments: Left ear: Normal-appearing TM with chronic appearing, small perforation noted.  Right ear: Erythematous and hyperemic with chronic appearing scarring of the TM.  No vesicular rash noted.    Nose: Nose normal.  Eyes:     General:        Right eye: No discharge.        Left eye: No discharge.     Conjunctiva/sclera: Conjunctivae normal.     Comments: Eyes appear normal.  Neck:     Musculoskeletal: Normal range of motion and neck supple.  Cardiovascular:     Rate and Rhythm: Normal rate and regular rhythm.     Heart sounds: Normal heart sounds.  Pulmonary:     Effort: Pulmonary effort is normal.     Breath sounds: Normal breath sounds.  Abdominal:     Palpations: Abdomen is soft.     Tenderness: There is no abdominal tenderness.  Skin:    General: Skin is warm and dry.     Comments: No facial rash.  Neurological:     Mental Status: She is alert.      ED Treatments / Results  Labs (all labs ordered are listed, but only abnormal results are displayed) Labs  Reviewed - No data to display  EKG None  Radiology No results found.  Procedures Procedures (including critical care time)  Medications Ordered in ED Medications - No data to display   Initial  Impression / Assessment and Plan / ED Course  I have reviewed the triage vital signs and the nursing notes.  Pertinent labs & imaging results that were available during my care of the patient were reviewed by me and considered in my medical decision making (see chart for details).        Patient seen and examined.  Patient was seen in the emergency department about 5 weeks ago with similar symptoms and treated with Augmentin.  Other times in the past she has been covered with Valtrex.  Given her history, it is unclear whether this is an otitis media versus reactivation of shingles in its early stages.  Patient will be covered with Augmentin, Valtrex, and given Zofran for nausea.  She is given an ENT referral today and is encouraged to follow-up with either them or her primary care doctor to ensure resolution of symptoms.  She is encouraged to return if her symptoms worsen or change or become different than what she has experienced in the past.  Vital signs reviewed and are as follows: BP (!) 141/90 (BP Location: Right Arm)   Pulse 77   Temp 98.4 F (36.9 C) (Oral)   Resp 16   Ht 5\' 4"  (1.626 m)   Wt 111.6 kg   LMP 09/01/2018   SpO2 100%   BMI 42.23 kg/m     Final Clinical Impressions(s) / ED Diagnoses   Final diagnoses:  Right ear pain   Patient with right ear pain, recurrent and chronic as noted above.  Patient appears well.  Will treat symptoms as above based on her previous presentation and diagnoses.  No indications for emergent ENT consultation or hospitalization at this time.  Appropriate referrals given.  Return instructions as above.   ED Discharge Orders         Ordered    valACYclovir (VALTREX) 1000 MG tablet  3 times daily     09/07/18 1915     amoxicillin-clavulanate (AUGMENTIN) 875-125 MG tablet  Every 12 hours     09/07/18 1915    ondansetron (ZOFRAN ODT) 4 MG disintegrating tablet  Every 8 hours PRN     09/07/18 1915           Carlisle Cater, Hershal Coria 09/07/18 1923    Tegeler, Gwenyth Allegra, MD 09/07/18 2245

## 2018-11-01 ENCOUNTER — Encounter (HOSPITAL_BASED_OUTPATIENT_CLINIC_OR_DEPARTMENT_OTHER): Payer: Self-pay | Admitting: *Deleted

## 2018-11-01 ENCOUNTER — Other Ambulatory Visit: Payer: Self-pay

## 2018-11-01 ENCOUNTER — Emergency Department (HOSPITAL_BASED_OUTPATIENT_CLINIC_OR_DEPARTMENT_OTHER)
Admission: EM | Admit: 2018-11-01 | Discharge: 2018-11-01 | Disposition: A | Discharge status: Home / Self Care | Payer: Medicaid Other | Attending: Emergency Medicine | Admitting: Emergency Medicine

## 2018-11-01 DIAGNOSIS — H6691 Otitis media, unspecified, right ear: Secondary | ICD-10-CM | POA: Insufficient documentation

## 2018-11-01 DIAGNOSIS — H9201 Otalgia, right ear: Secondary | ICD-10-CM | POA: Diagnosis present

## 2018-11-01 DIAGNOSIS — H6690 Otitis media, unspecified, unspecified ear: Secondary | ICD-10-CM

## 2018-11-01 MED ORDER — AMOXICILLIN-POT CLAVULANATE 875-125 MG PO TABS: 1.0000 | ORAL_TABLET | Freq: Two times a day (BID) | ORAL | 0 refills | Status: AC

## 2018-11-01 MED ORDER — OFLOXACIN 0.3 % OT SOLN: 10.0000 [drp] | Freq: Every day | OTIC | 0 refills | Status: AC

## 2018-11-01 MED ORDER — AMOXICILLIN-POT CLAVULANATE 875-125 MG PO TABS: 1.0000 | ORAL_TABLET | Freq: Two times a day (BID) | ORAL | 0 refills | Status: DC

## 2018-11-01 NOTE — ED Provider Notes (Signed)
Emergency Department Provider Note   I have reviewed the triage vital signs and the nursing notes.   HISTORY  Chief Complaint Ear pain   HPI Melissa [name] is a 43 y.o. female with PMH of recurrent right OTM presents to the ED with right ear pain. Some discharge on the pillow this AM. 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 Had been followed by ENT in the past by her provider died and has not been able to get additional ENT follow up that she thought was helpful. She has had some discomfort in the ear since her last ED visit in April. She completed abx at that time with some relief. Discharge and worsening pain starting last night. No fever. No HA. No confusion. No vision change.    Past Medical History:  Diagnosis Date  . Abnormal Pap smear 08/30/2008   LGSIL  . Allergy   . BV (bacterial vaginosis)    recurring  . Chlamydia   . Chronic pain of left knee   . Ectopic pregnancy   . Fibroid   . Infertility    Secondary PCOS  . PCOS (polycystic ovarian syndrome)   . Shingles outbreak 11/2011  . Vertigo     Patient Active Problem List   Diagnosis Date Noted  . Right ankle injury 03/01/2016  . Rash and nonspecific skin eruption 08/11/2015  . Contraceptive management 10/22/2013  . Cold 10/22/2013  . Uterine fibroids, antepartum condition or complication 02/27/5101  . Ruptured ectopic pregnancy 07/05/2011  . Obesity 12/21/2010    Past Surgical History:  Procedure Laterality Date  . DILATION AND CURETTAGE OF UTERUS  06/2011   ectopic  . EAR TUBE REMOVAL    . ECTOPIC PREGNANCY SURGERY    . LAPAROSCOPY  07/05/2011   Procedure: LAPAROSCOPY OPERATIVE;  Surgeon: Osborne Oman, MD;  Location: [facility];  Service: Gynecology;  Laterality: N/A;  . right ear surgery    . WISDOM TOOTH EXTRACTION      Allergies Artichoke Adela Glimpse scolymus (artichoke)]  Family History  Problem Relation Age of Onset  . Diabetes Mother   . Hypertension Mother   . Cancer  Father   . Fibroids Sister   . Hypertension Sister   . Crohn's disease Brother   . Cancer Brother   . Diabetes Brother   . Hypertension Brother   . Kidney disease Brother   . Anesthesia problems Neg Hx     Social History Social History   Tobacco Use  . Smoking status: Never Smoker  . Smokeless tobacco: Never Used  Substance Use Topics  . Alcohol use: No  . Drug use: No    Review of Systems  Constitutional: No fever/chills Eyes: No visual changes. ENT: No sore throat. Positive right ear pain and discharge.  Cardiovascular: Denies chest pain. Respiratory: Denies shortness of breath. Gastrointestinal: No abdominal pain.  No nausea, no vomiting.  No diarrhea.  No constipation. Genitourinary: Negative for dysuria. Musculoskeletal: Negative for back pain. Skin: Negative for rash. Neurological: Negative for headaches, focal weakness or numbness.  10-point ROS otherwise negative.  ____________________________________________   PHYSICAL EXAM:  VITAL SIGNS: ED Triage Vitals  Enc Vitals Group     BP 11/01/18 1133 121/83     Pulse Rate 11/01/18 1133 (!) 56     Resp 11/01/18 1133 18     Temp 11/01/18 1133 98.3 F (36.8 C)     Temp Source 11/01/18 1133 Oral     SpO2 11/01/18 1133 100 %  Weight 11/01/18 1133 237 lb (107.5 kg)     Height 11/01/18 1133 5\' 4"  (1.626 m)   Constitutional: Alert and oriented. Well appearing and in no acute distress. Eyes: Conjunctivae are normal. PERRL. EOMI. Head: Atraumatic. Ears:  Healthy appearing ear canals . Right TM with dullness and erythema. Normal left TM.  Nose: No congestion/rhinnorhea. Mouth/Throat: Mucous membranes are moist.  Oropharynx non-erythematous. Neck: No stridor.   Cardiovascular:  Good peripheral circulation. Respiratory: Normal respiratory effort.   Gastrointestinal: No distention.  Musculoskeletal: No lower extremity tenderness nor edema. No gross deformities of extremities. Neurologic:  Normal speech and  language.  Skin:  Skin is warm, dry and intact. No rash noted.   ____________________________________________  RADIOLOGY  None ____________________________________________   PROCEDURES  Procedure(s) performed:   Procedures  None ____________________________________________   INITIAL IMPRESSION / ASSESSMENT AND PLAN / ED COURSE  Pertinent labs & imaging results that were available during my care of the patient were reviewed by me and considered in my medical decision making (see chart for details).   Patient with recurrent OTM. Evidence of OTM of the left. No TM perforation. Will restart abx and drops with some discharge from the ear this AM. Suspect anatomical reason for recurrent OTM on this side post surgery. Advised returning to ENT care. Gave name of provider on call. She will call in the coming week to schedule a follow up.    ____________________________________________  FINAL CLINICAL IMPRESSION(S) / ED DIAGNOSES  Final diagnoses:  Acute otitis media, unspecified otitis media type     NEW OUTPATIENT MEDICATIONS STARTED DURING THIS VISIT:  Discharge Medication List as of 11/01/2018 12:10 PM    START taking these medications   Details  ofloxacin (FLOXIN) 0.3 % OTIC solution Place 10 drops into the right ear daily for 7 days., Starting Sat 11/01/2018, Until Sat 11/08/2018, Normal    amoxicillin-clavulanate (AUGMENTIN) 875-125 MG tablet Take 1 tablet by mouth every 12 (twelve) hours for 10 days., Starting Sat 11/01/2018, Until Tue 11/11/2018, Print        Note:  This document was prepared using Dragon voice recognition software and may include unintentional dictation errors.  Nanda Quinton, MD Emergency Medicine    Delray Reza, Wonda Olds, MD 11/02/18 [phone]

## 2018-11-01 NOTE — ED Notes (Signed)
ED Provider at bedside. 

## 2018-11-01 NOTE — Discharge Instructions (Signed)
You were seen in the emergency department today with right ear pain.  I am treating for infection but you will need to follow with an ear nose and throat doctor for further management.  Take the antibiotic pills and drops as prescribed.  Return to the emergency department any new or suddenly worsening symptoms.

## 2018-11-01 NOTE — ED Triage Notes (Signed)
June 5th, patient woke up with a bloody discharge from her right ear accompanied with pain that its worst.

## 2019-03-16 ENCOUNTER — Other Ambulatory Visit (HOSPITAL_COMMUNITY)
Admission: RE | Admit: 2019-03-16 | Discharge: 2019-03-16 | Disposition: A | Discharge status: Home / Self Care | Payer: Medicaid Other | Source: Ambulatory Visit | Attending: Student | Admitting: Student

## 2019-03-16 ENCOUNTER — Ambulatory Visit (INDEPENDENT_AMBULATORY_CARE_PROVIDER_SITE_OTHER): Payer: Medicaid Other | Admitting: Student

## 2019-03-16 ENCOUNTER — Encounter: Payer: Self-pay | Admitting: Student

## 2019-03-16 ENCOUNTER — Other Ambulatory Visit: Payer: Self-pay

## 2019-03-16 VITALS — BP 105/75 | HR 74 | Wt 246.0 lb

## 2019-03-16 DIAGNOSIS — Z0000 Encounter for general adult medical examination without abnormal findings: Secondary | ICD-10-CM

## 2019-03-16 DIAGNOSIS — Z01419 Encounter for gynecological examination (general) (routine) without abnormal findings: Secondary | ICD-10-CM | POA: Insufficient documentation

## 2019-03-16 DIAGNOSIS — N761 Subacute and chronic vaginitis: Secondary | ICD-10-CM | POA: Insufficient documentation

## 2019-03-16 MED ORDER — BORIC ACID CRYS: 600.0000 mg | CRYSTALS | Freq: Every day | 0 refills | Status: AC

## 2019-03-16 MED ORDER — BORIC ACID CRYS: 600.0000 mg | CRYSTALS | Freq: Every day | 5 refills | Status: DC

## 2019-03-16 NOTE — Progress Notes (Signed)
Bumps itching and discharge. States she had gotten the Boric Acid Suppositories before and it helps.

## 2019-03-16 NOTE — Progress Notes (Signed)
Subjective:     Melissa [name] is a 43 y.o. female here for a routine exam.  Current complaints: vaginal discharge & vulvar itching.  Reports chronic recurrent issues with discharge & itching. Describes discharge as clear & "stringy". Discharge does not change with her cycle. No odor. Vulvar itching has been going on "for a while". States she itches so much that at times she scratches until she bleeds. Over time she has switched out her soap without relief. Wears cotton underwear. Has not been sexually active in 6+ years.  The only thing that has worked in the past is boric acid.  She currently does not have a PCP.   Gynecologic History Patient's last menstrual period was 03/09/2019 (within days). Contraception: abstinence Last Pap: 2017. Results were: normal Last mammogram: not done.   Obstetric History OB History  Gravida Para Term Preterm AB Living  5 3 3  0 2 3  SAB TAB Ectopic Multiple Live Births  1 0 1 0 3    # Outcome Date GA Lbr Len/2nd Weight Sex Delivery Anes PTL Lv  5 Term 06/03/13    M    LIV     Birth Comments: System Generated. Please review and update pregnancy details.  4 Term 05/30/12 [redacted]w[redacted]d 17:21 / 00:11 6 lb 10 oz (3.005 kg) M Vag-Spont EPI  LIV  3 Ectopic 07/05/11             Birth Comments: D&C  2 Term 04/26/00     Vag-Spont   LIV  1 SAB              The following portions of the patient's history were reviewed and updated as appropriate: allergies, current medications, past family history, past social history, past surgical history and problem list.  Review of Systems Pertinent items are noted in HPI.    Objective:    BP 105/75   Pulse 74   Wt 246 lb (111.6 kg)   LMP 03/09/2019 (Within Days)   BMI 42.23 kg/m  General appearance: alert, cooperative, appears stated age and moderately obese Head: Normocephalic, without obvious abnormality, atraumatic Eyes: conjunctivae/corneas clear. PERRL, EOM's intact. Fundi benign. Abdomen: normal findings: abdomen  soft & non tender Pelvic: No vulvar lesions. Small area of lichenification on bilateral lower vulva. Small amount of white homogenous discharge.  cervix normal in appearance, no adnexal masses or tenderness, no cervical motion tenderness and uterus normal size, shape, and consistency Skin: Skin color, texture, turgor normal. No rashes or lesions Neurologic: Grossly normal    Assessment:   1. Encounter for annual routine gynecological examination  - Cytology - PAP( [facility]) - MM Digital Screening; Future  2. Chronic vaginitis  - Cytology - PAP( [facility])   Plan:    Education reviewed: establish with PCP asap. Discussed hygiene practices to help with vulvar symptoms. . Mammogram ordered. Rx boric acid x 2 weeks. If symptoms don't improve, patient to return for office for further evaluation.     Jorje Guild, NP

## 2019-03-16 NOTE — Patient Instructions (Signed)
Vaginitis Vaginitis is a condition in which the vaginal tissue swells and becomes red (inflamed). This condition is most often caused by a change in the normal balance of bacteria and yeast that live in the vagina. This change causes an overgrowth of certain bacteria or yeast, which causes the inflammation. There are different types of vaginitis, but the most common types are:  Bacterial vaginosis.  Yeast infection (candidiasis).  Trichomoniasis vaginitis. This is a sexually transmitted disease (STD).  Viral vaginitis.  Atrophic vaginitis.  Allergic vaginitis. What are the causes? The cause of this condition depends on the type of vaginitis. It can be caused by:  Bacteria (bacterial vaginosis).  Yeast, which is a fungus (yeast infection).  A parasite (trichomoniasis vaginitis).  A virus (viral vaginitis).  Low hormone levels (atrophic vaginitis). Low hormone levels can occur during pregnancy, breastfeeding, or after menopause.  Irritants, such as bubble baths, scented tampons, and feminine sprays (allergic vaginitis). Other factors can change the normal balance of the yeast and bacteria that live in the vagina. These include:  Antibiotic medicines.  Poor hygiene.  Diaphragms, vaginal sponges, spermicides, birth control pills, and intrauterine devices (IUD).  Sex.  Infection.  Uncontrolled diabetes.  A weakened defense (immune) system. What increases the risk? This condition is more likely to develop in women who:  Smoke.  Use vaginal douches, scented tampons, or scented sanitary pads.  Wear tight-fitting pants.  Wear thong underwear.  Use oral birth control pills or an IUD.  Have sex without a condom.  Have multiple sex partners.  Have an STD.  Frequently use the spermicide nonoxynol-9.  Eat lots of foods high in sugar.  Have uncontrolled diabetes.  Have low estrogen levels.  Have a weakened immune system from an immune disorder or medical  treatment.  Are pregnant or breastfeeding. What are the signs or symptoms? Symptoms vary depending on the cause of the vaginitis. Common symptoms include:  Abnormal vaginal discharge. ? The discharge is white, gray, or yellow with bacterial vaginosis. ? The discharge is thick, white, and cheesy with a yeast infection. ? The discharge is frothy and yellow or greenish with trichomoniasis.  A bad vaginal smell. The smell is fishy with bacterial vaginosis.  Vaginal itching, pain, or swelling.  Sex that is painful.  Pain or burning when urinating. Sometimes there are no symptoms. How is this diagnosed? This condition is diagnosed based on your symptoms and medical history. A physical exam, including a pelvic exam, will also be done. You may also have other tests, including:  Tests to determine the pH level (acidity or alkalinity) of your vagina.  A whiff test, to assess the odor that results when a sample of your vaginal discharge is mixed with a potassium hydroxide solution.  Tests of vaginal fluid. A sample will be examined under a microscope. How is this treated? Treatment varies depending on the type of vaginitis you have. Your treatment may include:  Antibiotic creams or pills to treat bacterial vaginosis and trichomoniasis.  Antifungal medicines, such as vaginal creams or suppositories, to treat a yeast infection.  Medicine to ease discomfort if you have viral vaginitis. Your sexual partner should also be treated.  Estrogen delivered in a cream, pill, suppository, or vaginal ring to treat atrophic vaginitis. If vaginal dryness occurs, lubricants and moisturizing creams may help. You may need to avoid scented soaps, sprays, or douches.  Stopping use of a product that is causing allergic vaginitis. Then using a vaginal cream to treat the symptoms. Follow   these instructions at home: Lifestyle  Keep your genital area clean and dry. Avoid soap, and only rinse the area with  water.  Do not douche or use tampons until your health care provider says it is okay to do so. Use sanitary pads, if needed.  Do not have sex until your health care provider approves. When you can return to sex, practice safe sex and use condoms.  Wipe from front to back. This avoids the spread of bacteria from the rectum to the vagina. General instructions  Take over-the-counter and prescription medicines only as told by your health care provider.  If you were prescribed an antibiotic medicine, take or use it as told by your health care provider. Do not stop taking or using the antibiotic even if you start to feel better.  Keep all follow-up visits as told by your health care provider. This is important. How is this prevented?  Use mild, non-scented products. Do not use things that can irritate the vagina, such as fabric softeners. Avoid the following products if they are scented: ? Feminine sprays. ? Detergents. ? Tampons. ? Feminine hygiene products. ? Soaps or bubble baths.  Let air reach your genital area. ? Wear cotton underwear to reduce moisture buildup. ? Avoid wearing underwear while you sleep. ? Avoid wearing tight pants and underwear or nylons without a cotton panel. ? Avoid wearing thong underwear.  Take off any wet clothing, such as bathing suits, as soon as possible.  Practice safe sex and use condoms. Contact a health care provider if:  You have abdominal pain.  You have a fever.  You have symptoms that last for more than 2-3 days. Get help right away if:  You have a fever and your symptoms suddenly get worse. Summary  Vaginitis is a condition in which the vaginal tissue becomes inflamed.This condition is most often caused by a change in the normal balance of bacteria and yeast that live in the vagina.  Treatment varies depending on the type of vaginitis you have.  Do not douche, use tampons , or have sex until your health care provider approves. When  you can return to sex, practice safe sex and use condoms. This information is not intended to replace advice given to you by your health care provider. Make sure you discuss any questions you have with your health care provider. Document Released: 02/25/2007 Document Revised: 04/12/2017 Document Reviewed: 06/05/2016 Elsevier Patient Education  2020 [place] Maintenance, Female Adopting a healthy lifestyle and getting preventive care are important in promoting health and wellness. Ask your health care provider about:  The right schedule for you to have regular tests and exams.  Things you can do on your own to prevent diseases and keep yourself healthy. What should I know about diet, weight, and exercise? Eat a healthy diet   Eat a diet that includes plenty of vegetables, fruits, low-fat dairy products, and lean protein.  Do not eat a lot of foods that are high in solid fats, added sugars, or sodium. Maintain a healthy weight Body mass index (BMI) is used to identify weight problems. It estimates body fat based on height and weight. Your health care provider can help determine your BMI and help you achieve or maintain a healthy weight. Get regular exercise Get regular exercise. This is one of the most important things you can do for your health. Most adults should:  Exercise for at least 150 minutes each week. The exercise should increase your heart  rate and make you sweat (moderate-intensity exercise).  Do strengthening exercises at least twice a week. This is in addition to the moderate-intensity exercise.  Spend less time sitting. Even light physical activity can be beneficial. Watch cholesterol and blood lipids Have your blood tested for lipids and cholesterol at 41 years of age, then have this test every 5 years. Have your cholesterol levels checked more often if:  Your lipid or cholesterol levels are high.  You are older than 41 years of age.  You are at high risk  for heart disease. What should I know about cancer screening? Depending on your health history and family history, you may need to have cancer screening at various ages. This may include screening for:  Breast cancer.  Cervical cancer.  Colorectal cancer.  Skin cancer.  Lung cancer. What should I know about heart disease, diabetes, and high blood pressure? Blood pressure and heart disease  High blood pressure causes heart disease and increases the risk of stroke. This is more likely to develop in people who have high blood pressure readings, are of African descent, or are overweight.  Have your blood pressure checked: ? Every 3-5 years if you are 75-70 years of age. ? Every year if you are 49 years old or older. Diabetes Have regular diabetes screenings. This checks your fasting blood sugar level. Have the screening done:  Once every three years after age 7 if you are at a normal weight and have a low risk for diabetes.  More often and at a younger age if you are overweight or have a high risk for diabetes. What should I know about preventing infection? Hepatitis B If you have a higher risk for hepatitis B, you should be screened for this virus. Talk with your health care provider to find out if you are at risk for hepatitis B infection. Hepatitis C Testing is recommended for:  Everyone born from [birth year] through 1963.  Anyone with known risk factors for hepatitis C. Sexually transmitted infections (STIs)  Get screened for STIs, including gonorrhea and chlamydia, if: ? You are sexually active and are younger than 41 years of age. ? You are older than 41 years of age and your health care provider tells you that you are at risk for this type of infection. ? Your sexual activity has changed since you were last screened, and you are at increased risk for chlamydia or gonorrhea. Ask your health care provider if you are at risk.  Ask your health care provider about whether you are  at high risk for HIV. Your health care provider may recommend a prescription medicine to help prevent HIV infection. If you choose to take medicine to prevent HIV, you should first get tested for HIV. You should then be tested every 3 months for as long as you are taking the medicine. Pregnancy  If you are about to stop having your period (premenopausal) and you may become pregnant, seek counseling before you get pregnant.  Take 400 to 800 micrograms (mcg) of folic acid every day if you become pregnant.  Ask for birth control (contraception) if you want to prevent pregnancy. Osteoporosis and menopause Osteoporosis is a disease in which the bones lose minerals and strength with aging. This can result in bone fractures. If you are 63 years old or older, or if you are at risk for osteoporosis and fractures, ask your health care provider if you should:  Be screened for bone loss.  Take a calcium or  vitamin D supplement to lower your risk of fractures.  Be given hormone replacement therapy (HRT) to treat symptoms of menopause. Follow these instructions at home: Lifestyle  Do not use any products that contain nicotine or tobacco, such as cigarettes, e-cigarettes, and chewing tobacco. If you need help quitting, ask your health care provider.  Do not use street drugs.  Do not share needles.  Ask your health care provider for help if you need support or information about quitting drugs. Alcohol use  Do not drink alcohol if: ? Your health care provider tells you not to drink. ? You are pregnant, may be pregnant, or are planning to become pregnant.  If you drink alcohol: ? Limit how much you use to 0-1 drink a day. ? Limit intake if you are breastfeeding.  Be aware of how much alcohol is in your drink. In the [place] one drink equals one 12 oz bottle of beer (355 mL), one 5 oz glass of wine (148 mL), or one 1 oz glass of hard liquor (44 mL). General instructions  Schedule regular health,  dental, and eye exams.  Stay current with your vaccines.  Tell your health care provider if: ? You often feel depressed. ? You have ever been abused or do not feel safe at home. Summary  Adopting a healthy lifestyle and getting preventive care are important in promoting health and wellness.  Follow your health care provider's instructions about healthy diet, exercising, and getting tested or screened for diseases.  Follow your health care provider's instructions on monitoring your cholesterol and blood pressure. This information is not intended to replace advice given to you by your health care provider. Make sure you discuss any questions you have with your health care provider. Document Released: 11/13/2010 Document Revised: 04/23/2018 Document Reviewed: 04/23/2018 Elsevier Patient Education  2020 [employer].

## 2019-03-16 NOTE — Addendum Note (Signed)
Addended by: Jorje Guild B on: 03/16/2019 03:32 PM   Modules accepted: Level of Service

## 2019-03-20 LAB — CYTOLOGY - PAP
Chlamydia: NEGATIVE
Comment: NEGATIVE
Comment: NEGATIVE
Comment: NEGATIVE
Comment: NORMAL
Diagnosis: NEGATIVE
High risk HPV: NEGATIVE
Neisseria Gonorrhea: NEGATIVE
Trichomonas: NEGATIVE

## 2019-07-01 ENCOUNTER — Encounter (HOSPITAL_BASED_OUTPATIENT_CLINIC_OR_DEPARTMENT_OTHER): Payer: Self-pay

## 2019-07-01 ENCOUNTER — Emergency Department (HOSPITAL_BASED_OUTPATIENT_CLINIC_OR_DEPARTMENT_OTHER)
Admission: EM | Admit: 2019-07-01 | Discharge: 2019-07-01 | Disposition: A | Discharge status: Home / Self Care | Payer: Medicaid Other | Attending: Emergency Medicine | Admitting: Emergency Medicine

## 2019-07-01 ENCOUNTER — Other Ambulatory Visit: Payer: Self-pay

## 2019-07-01 DIAGNOSIS — Z79899 Other long term (current) drug therapy: Secondary | ICD-10-CM | POA: Diagnosis not present

## 2019-07-01 DIAGNOSIS — R112 Nausea with vomiting, unspecified: Secondary | ICD-10-CM | POA: Insufficient documentation

## 2019-07-01 DIAGNOSIS — R197 Diarrhea, unspecified: Secondary | ICD-10-CM | POA: Diagnosis not present

## 2019-07-01 LAB — URINALYSIS, MICROSCOPIC (REFLEX)

## 2019-07-01 LAB — CBC WITH DIFFERENTIAL/PLATELET
Abs Immature Granulocytes: 0.01 10*3/uL (ref 0.00–0.07)
Basophils Absolute: 0 10*3/uL (ref 0.0–0.1)
Basophils Relative: 1 %
Eosinophils Absolute: 0.1 10*3/uL (ref 0.0–0.5)
Eosinophils Relative: 2 %
HCT: 39.6 % (ref 36.0–46.0)
Hemoglobin: 12.5 g/dL (ref 12.0–15.0)
Immature Granulocytes: 0 %
Lymphocytes Relative: 33 %
Lymphs Abs: 1.5 10*3/uL (ref 0.7–4.0)
MCH: 28.1 pg (ref 26.0–34.0)
MCHC: 31.6 g/dL (ref 30.0–36.0)
MCV: 89 fL (ref 80.0–100.0)
Monocytes Absolute: 0.5 10*3/uL (ref 0.1–1.0)
Monocytes Relative: 10 %
Neutro Abs: 2.5 10*3/uL (ref 1.7–7.7)
Neutrophils Relative %: 54 %
Platelets: 306 10*3/uL (ref 150–400)
RBC: 4.45 MIL/uL (ref 3.87–5.11)
RDW: 14.6 % (ref 11.5–15.5)
WBC: 4.7 10*3/uL (ref 4.0–10.5)
nRBC: 0 % (ref 0.0–0.2)

## 2019-07-01 LAB — URINALYSIS, ROUTINE W REFLEX MICROSCOPIC
Bilirubin Urine: NEGATIVE
Glucose, UA: NEGATIVE mg/dL
Ketones, ur: NEGATIVE mg/dL
Leukocytes,Ua: NEGATIVE
Nitrite: NEGATIVE
Protein, ur: NEGATIVE mg/dL
Specific Gravity, Urine: >1.03 — ABNORMAL HIGH (ref 1.005–1.030)
pH: 5.5 (ref 5.0–8.0)

## 2019-07-01 LAB — BASIC METABOLIC PANEL
Anion gap: 7 (ref 5–15)
BUN: 19 mg/dL (ref 6–20)
CO2: 25 mmol/L (ref 22–32)
Calcium: 9.1 mg/dL (ref 8.9–10.3)
Chloride: 105 mmol/L (ref 98–111)
Creatinine, Ser: 0.95 mg/dL (ref 0.44–1.00)
GFR calc Af Amer: >60 mL/min (ref >60)
GFR calc non Af Amer: >60 mL/min (ref >60)
Glucose, Bld: 87 mg/dL (ref 70–99)
Potassium: 4 mmol/L (ref 3.5–5.1)
Sodium: 137 mmol/L (ref 135–145)

## 2019-07-01 LAB — PREGNANCY, URINE: Preg Test, Ur: NEGATIVE

## 2019-07-01 MED ORDER — ONDANSETRON 4 MG PO TBDP: 4.0000 mg | ORAL_TABLET | Freq: Three times a day (TID) | ORAL | 0 refills | Status: DC | PRN

## 2019-07-01 MED ORDER — ONDANSETRON HCL 4 MG/2ML IJ SOLN
4.0000 mg | Freq: Once | INTRAMUSCULAR | Status: AC
Start: 2019-07-01 — End: 2019-07-01
  Administered 2019-07-01: 13:00:00 4 mg via INTRAVENOUS
  Filled 2019-07-01: qty 2

## 2019-07-01 MED ORDER — SODIUM CHLORIDE 0.9 % IV BOLUS
1000.0000 mL | Freq: Once | INTRAVENOUS | Status: AC
  Administered 2019-07-01: 13:00:00 1000 mL via INTRAVENOUS

## 2019-07-01 NOTE — ED Triage Notes (Signed)
Pc/o n/v/d started yesterday after eating fast food-NAD-steady gait

## 2019-07-07 NOTE — ED Provider Notes (Signed)
[facility] EMERGENCY DEPARTMENT Provider Note   CSN: [id number] Arrival date & time: 07/01/19  1122     History Chief Complaint  Patient presents with  . Emesis    Melissa [name] is a 44 y.o. female.  HPI   44 year old female with nausea/vomiting/diarrhea.  Onset yesterday.  Persistent since then.  Unable to keep anything down.  Some crampy diffuse abdominal pain.  No sick contacts that she is aware of.  No fevers or chills.  No urinary complaints.  No cough.  Past Medical History:  Diagnosis Date  . Abnormal Pap smear 08/30/2008   LGSIL  . Allergy   . BV (bacterial vaginosis)    recurring  . Chlamydia   . Chronic pain of left knee   . Ectopic pregnancy   . Fibroid   . Infertility    Secondary PCOS  . PCOS (polycystic ovarian syndrome)   . Shingles outbreak 11/2011  . Vertigo     Patient Active Problem List   Diagnosis Date Noted  . Right ankle injury 03/01/2016  . Rash and nonspecific skin eruption 08/11/2015  . Contraceptive management 10/22/2013  . Cold 10/22/2013  . Uterine fibroids, antepartum condition or complication 99991111  . Ruptured ectopic pregnancy 07/05/2011  . Obesity 12/21/2010    Past Surgical History:  Procedure Laterality Date  . DILATION AND CURETTAGE OF UTERUS  06/2011   ectopic  . EAR TUBE REMOVAL    . ECTOPIC PREGNANCY SURGERY    . LAPAROSCOPY  07/05/2011   Procedure: LAPAROSCOPY OPERATIVE;  Surgeon: Osborne Oman, MD;  Location: [facility];  Service: Gynecology;  Laterality: N/A;  . right ear surgery    . WISDOM TOOTH EXTRACTION       OB History    Gravida  5   Para  3   Term  3   Preterm  0   AB  2   Living  3     SAB  1   TAB  0   Ectopic  1   Multiple  0   Live Births  3           Family History  Problem Relation Age of Onset  . Diabetes Mother   . Hypertension Mother   . Cancer Father   . Fibroids Sister   . Hypertension Sister   . Crohn's disease Brother   . Cancer Brother   .  Diabetes Brother   . Hypertension Brother   . Kidney disease Brother   . Anesthesia problems Neg Hx     Social History   Tobacco Use  . Smoking status: Never Smoker  . Smokeless tobacco: Never Used  Substance Use Topics  . Alcohol use: No  . Drug use: No    Home Medications Prior to Admission medications   Medication Sig Start Date End Date Taking? Authorizing Provider  Multiple Vitamins-Minerals (MULTIVITAMIN WITH MINERALS) tablet Take by mouth.    [provider]  Naproxen Sod-diphenhydrAMINE (ALEVE PM PO) Take 1 tablet by mouth 2 (two) times daily as needed.    [provider]  ondansetron (ZOFRAN ODT) 4 MG disintegrating tablet Take 1 tablet (4 mg total) by mouth every 8 (eight) hours as needed for nausea or vomiting. 07/01/19   Virgel Manifold, MD  diphenhydrAMINE (BENADRYL) 12.5 MG/5ML liquid Take 12.5 mg by mouth once.  08/01/11  [provider]    Allergies    Philippa Sicks scolymus (artichoke)]  Review of Systems   Review of  Systems All systems reviewed and negative, other than as noted in HPI.  Physical Exam Updated Vital Signs BP (!) 143/74 (BP Location: Right Arm)   Pulse 64   Temp 99.2 F (37.3 C) (Oral)   Resp 18   Ht 5\' 4"  (1.626 m)   Wt 112.9 kg   LMP 05/31/2019   SpO2 100%   BMI 42.74 kg/m   Physical Exam Vitals and nursing note reviewed.  Constitutional:      General: She is not in acute distress.    Appearance: She is well-developed.  HENT:     Head: Normocephalic and atraumatic.  Eyes:     General:        Right eye: No discharge.        Left eye: No discharge.     Conjunctiva/sclera: Conjunctivae normal.  Cardiovascular:     Rate and Rhythm: Normal rate and regular rhythm.     Heart sounds: Normal heart sounds. No murmur. No friction rub. No gallop.   Pulmonary:     Effort: Pulmonary effort is normal. No respiratory distress.     Breath sounds: Normal breath sounds.  Abdominal:     General: There is no  distension.     Palpations: Abdomen is soft.     Tenderness: There is no abdominal tenderness.  Musculoskeletal:        General: No tenderness.     Cervical back: Neck supple.  Skin:    General: Skin is warm and dry.  Neurological:     Mental Status: She is alert.  Psychiatric:        Behavior: Behavior normal.        Thought Content: Thought content normal.     ED Results / Procedures / Treatments   Labs (all labs ordered are listed, but only abnormal results are displayed) Labs Reviewed  URINALYSIS, ROUTINE W REFLEX MICROSCOPIC - Abnormal; Notable for the following components:      Result Value   Specific Gravity, Urine >1.030 (*)    Hgb urine dipstick TRACE (*)    All other components within normal limits  URINALYSIS, MICROSCOPIC (REFLEX) - Abnormal; Notable for the following components:   Bacteria, UA FEW (*)    All other components within normal limits  PREGNANCY, URINE  CBC WITH DIFFERENTIAL/PLATELET  BASIC METABOLIC PANEL    EKG None  Radiology No results found.  Procedures Procedures (including critical care time)  Medications Ordered in ED Medications  sodium chloride 0.9 % bolus 1,000 mL (0 mLs Intravenous Stopped 07/01/19 1409)  ondansetron (ZOFRAN) injection 4 mg (4 mg Intravenous Given 07/01/19 1235)    ED Course  I have reviewed the triage vital signs and the nursing notes.  Pertinent labs & imaging results that were available during my care of the patient were reviewed by me and considered in my medical decision making (see chart for details).    MDM Rules/Calculators/A&P                      44 year old female with what I suspect is a viral GI illness.  Treated symptomatically with improvement.  Now tolerating p.o.  Abdominal exam is pretty benign.  I doubt emergent process.  Return precautions were discussed.  Continued symptomatic treatment otherwise. Final Clinical Impression(s) / ED Diagnoses Final diagnoses:  Nausea and vomiting,  intractability of vomiting not specified, unspecified vomiting type    Rx / DC Orders ED Discharge Orders  Ordered    ondansetron (ZOFRAN ODT) 4 MG disintegrating tablet  Every 8 hours PRN     07/01/19 1426           Virgel Manifold, MD 07/07/19 [phone]

## 2019-08-23 ENCOUNTER — Other Ambulatory Visit: Payer: Self-pay

## 2019-08-23 ENCOUNTER — Encounter (HOSPITAL_BASED_OUTPATIENT_CLINIC_OR_DEPARTMENT_OTHER): Payer: Self-pay | Admitting: *Deleted

## 2019-08-23 ENCOUNTER — Emergency Department (HOSPITAL_BASED_OUTPATIENT_CLINIC_OR_DEPARTMENT_OTHER)
Admission: EM | Admit: 2019-08-23 | Discharge: 2019-08-23 | Disposition: A | Discharge status: Home / Self Care | Payer: Medicaid Other | Attending: Emergency Medicine | Admitting: Emergency Medicine

## 2019-08-23 DIAGNOSIS — H6691 Otitis media, unspecified, right ear: Secondary | ICD-10-CM | POA: Insufficient documentation

## 2019-08-23 DIAGNOSIS — H6690 Otitis media, unspecified, unspecified ear: Secondary | ICD-10-CM

## 2019-08-23 DIAGNOSIS — H9201 Otalgia, right ear: Secondary | ICD-10-CM | POA: Diagnosis present

## 2019-08-23 MED ORDER — CIPROFLOXACIN-DEXAMETHASONE 0.3-0.1 % OT SUSP
4.0000 [drp] | Freq: Two times a day (BID) | OTIC | Status: DC
  Administered 2019-08-23: 4 [drp] via OTIC
  Filled 2019-08-23: qty 7.5

## 2019-08-23 MED ORDER — IBUPROFEN 800 MG PO TABS
800.0000 mg | ORAL_TABLET | Freq: Once | ORAL | Status: AC
  Administered 2019-08-23: 800 mg via ORAL
  Filled 2019-08-23: qty 1

## 2019-08-23 MED ORDER — NEOMYCIN-COLIST-HC-THONZONIUM 3.3-3-10-0.5 MG/ML OT SUSP
4.0000 [drp] | Freq: Four times a day (QID) | OTIC | Status: DC
  Filled 2019-08-23: qty 10

## 2019-08-23 MED ORDER — AMOXICILLIN 500 MG PO CAPS: 500.0000 mg | ORAL_CAPSULE | Freq: Three times a day (TID) | ORAL | 0 refills | Status: DC

## 2019-08-23 MED ORDER — ACETAMINOPHEN 500 MG PO TABS
1000.0000 mg | ORAL_TABLET | Freq: Once | ORAL | Status: AC
  Administered 2019-08-23: 1000 mg via ORAL
  Filled 2019-08-23: qty 2

## 2019-08-23 NOTE — ED Provider Notes (Signed)
[facility] EMERGENCY DEPARTMENT Provider Note   CSN: [id number] Arrival date & time: 08/23/19  0548     History Chief Complaint  Patient presents with  . ear pain    Melissa [name] is a 44 y.o. female.  Patient presents to the emergency department for evaluation of ear pain.  She first felt that yesterday and has progressively worsened.  Pain is now severe and she has noticed slightly decreased hearing in the right ear.  She does have a history of allergies, has been taking her allergy medication without improvement.  No cough, chest congestion, sore throat, fever.        Past Medical History:  Diagnosis Date  . Abnormal Pap smear 08/30/2008   LGSIL  . Allergy   . BV (bacterial vaginosis)    recurring  . Chlamydia   . Chronic pain of left knee   . Ectopic pregnancy   . Fibroid   . Infertility    Secondary PCOS  . PCOS (polycystic ovarian syndrome)   . Shingles outbreak 11/2011  . Vertigo     Patient Active Problem List   Diagnosis Date Noted  . Right ankle injury 03/01/2016  . Rash and nonspecific skin eruption 08/11/2015  . Contraceptive management 10/22/2013  . Cold 10/22/2013  . Uterine fibroids, antepartum condition or complication 99991111  . Ruptured ectopic pregnancy 07/05/2011  . Obesity 12/21/2010    Past Surgical History:  Procedure Laterality Date  . DILATION AND CURETTAGE OF UTERUS  06/2011   ectopic  . EAR TUBE REMOVAL    . ECTOPIC PREGNANCY SURGERY    . LAPAROSCOPY  07/05/2011   Procedure: LAPAROSCOPY OPERATIVE;  Surgeon: Osborne Oman, MD;  Location: [facility];  Service: Gynecology;  Laterality: N/A;  . right ear surgery    . WISDOM TOOTH EXTRACTION       OB History    Gravida  5   Para  3   Term  3   Preterm  0   AB  2   Living  3     SAB  1   TAB  0   Ectopic  1   Multiple  0   Live Births  3           Family History  Problem Relation Age of Onset  . Diabetes Mother   . Hypertension Mother     . Cancer Father   . Fibroids Sister   . Hypertension Sister   . Crohn's disease Brother   . Cancer Brother   . Diabetes Brother   . Hypertension Brother   . Kidney disease Brother   . Anesthesia problems Neg Hx     Social History   Tobacco Use  . Smoking status: Never Smoker  . Smokeless tobacco: Never Used  Substance Use Topics  . Alcohol use: No  . Drug use: No    Home Medications Prior to Admission medications   Medication Sig Start Date End Date Taking? Authorizing Provider  amoxicillin (AMOXIL) 500 MG capsule Take 1 capsule (500 mg total) by mouth 3 (three) times daily. 08/23/19   Orpah Greek, MD  diphenhydrAMINE (BENADRYL) 12.5 MG/5ML liquid Take 12.5 mg by mouth once.  08/01/11  [provider]    Allergies    Philippa Sicks scolymus (artichoke)]  Review of Systems   Review of Systems  HENT: Positive for ear pain.   All other systems reviewed and are negative.   Physical Exam Updated Vital Signs BP  131/84 (BP Location: Right Arm)   Temp 99.7 F (37.6 C) (Oral)   Resp 20   Ht 5\' 4"  (1.626 m)   Wt 107.5 kg   LMP 07/29/2019   SpO2 100%   BMI 40.68 kg/m   Physical Exam Vitals and nursing note reviewed.  Constitutional:      General: She is not in acute distress.    Appearance: Normal appearance. She is well-developed.  HENT:     Head: Normocephalic and atraumatic.     Right Ear: Hearing normal. Swelling (External canal, with erythema) and tenderness present. Tympanic membrane is injected.     Left Ear: Hearing normal.     Nose: Nose normal.  Eyes:     Conjunctiva/sclera: Conjunctivae normal.     Pupils: Pupils are equal, round, and reactive to light.  Cardiovascular:     Rate and Rhythm: Regular rhythm.     Heart sounds: S1 normal and S2 normal. No murmur. No friction rub. No gallop.   Pulmonary:     Effort: Pulmonary effort is normal. No respiratory distress.     Breath sounds: Normal breath sounds.  Chest:     Chest  wall: No tenderness.  Abdominal:     General: Bowel sounds are normal.     Palpations: Abdomen is soft.     Tenderness: There is no abdominal tenderness. There is no guarding or rebound. Negative signs include Murphy's sign and McBurney's sign.     Hernia: No hernia is present.  Musculoskeletal:        General: Normal range of motion.     Cervical back: Normal range of motion and neck supple.  Lymphadenopathy:     Head:     Right side of head: Preauricular adenopathy present.  Skin:    General: Skin is warm and dry.     Findings: No rash.  Neurological:     Mental Status: She is alert and oriented to person, place, and time.     GCS: GCS eye subscore is 4. GCS verbal subscore is 5. GCS motor subscore is 6.     Cranial Nerves: No cranial nerve deficit.     Sensory: No sensory deficit.     Coordination: Coordination normal.  Psychiatric:        Speech: Speech normal.        Behavior: Behavior normal.        Thought Content: Thought content normal.     ED Results / Procedures / Treatments   Labs (all labs ordered are listed, but only abnormal results are displayed) Labs Reviewed - No data to display  EKG None  Radiology No results found.  Procedures Procedures (including critical care time)  Medications Ordered in ED Medications  acetaminophen (TYLENOL) tablet 1,000 mg (has no administration in time range)  ibuprofen (ADVIL) tablet 800 mg (has no administration in time range)  neomycin-colistin-hydrocortisone-thonzonium (CORTISPORIN TC) OTIC (EAR) suspension 4 drop (has no administration in time range)    ED Course  I have reviewed the triage vital signs and the nursing notes.  Pertinent labs & imaging results that were available during my care of the patient were reviewed by me and considered in my medical decision making (see chart for details).    MDM Rules/Calculators/A&P                     Patient with right ear pain.  Examination reveals injected and  erythematous right tympanic membrane as well as external canal.  Treat for infection.  Final Clinical Impression(s) / ED Diagnoses Final diagnoses:  Acute otitis media, unspecified otitis media type    Rx / DC Orders ED Discharge Orders         Ordered    amoxicillin (AMOXIL) 500 MG capsule  3 times daily     08/23/19 [id number]           Orpah Greek, MD 08/23/19 [phone]

## 2019-08-23 NOTE — ED Triage Notes (Addendum)
Right ear pain that started yesterday. States hx of ear infections. Denies any fevers. States she has had a flare up of her allergies. Hearing decreased on that right ear. Has not taken anything for pain

## 2019-10-05 ENCOUNTER — Emergency Department (HOSPITAL_BASED_OUTPATIENT_CLINIC_OR_DEPARTMENT_OTHER)
Admission: EM | Admit: 2019-10-05 | Discharge: 2019-10-05 | Disposition: A | Discharge status: Home / Self Care | Payer: Medicaid Other | Attending: Emergency Medicine | Admitting: Emergency Medicine

## 2019-10-05 ENCOUNTER — Other Ambulatory Visit: Payer: Self-pay

## 2019-10-05 ENCOUNTER — Encounter (HOSPITAL_BASED_OUTPATIENT_CLINIC_OR_DEPARTMENT_OTHER): Payer: Self-pay

## 2019-10-05 DIAGNOSIS — H9201 Otalgia, right ear: Secondary | ICD-10-CM | POA: Diagnosis not present

## 2019-10-05 DIAGNOSIS — R21 Rash and other nonspecific skin eruption: Secondary | ICD-10-CM | POA: Diagnosis present

## 2019-10-05 DIAGNOSIS — Z91018 Allergy to other foods: Secondary | ICD-10-CM | POA: Diagnosis not present

## 2019-10-05 MED ORDER — PREDNISONE 20 MG PO TABS: 40.0000 mg | ORAL_TABLET | Freq: Every day | ORAL | 0 refills | Status: DC

## 2019-10-05 MED ORDER — ACYCLOVIR 400 MG PO TABS: 400.0000 mg | ORAL_TABLET | Freq: Every day | ORAL | 0 refills | Status: DC

## 2019-10-05 MED ORDER — HYDROCODONE-ACETAMINOPHEN 5-325 MG PO TABS: 1.0000 | ORAL_TABLET | Freq: Four times a day (QID) | ORAL | 0 refills | Status: DC | PRN

## 2019-10-05 MED ORDER — CEPHALEXIN 500 MG PO CAPS: 500.0000 mg | ORAL_CAPSULE | Freq: Three times a day (TID) | ORAL | 0 refills | Status: DC

## 2019-10-05 NOTE — ED Provider Notes (Signed)
[facility] EMERGENCY DEPARTMENT Provider Note   CSN: [id number] Arrival date & time: 10/05/19  1030     History Chief Complaint  Patient presents with  . Rash    Melissa [name] is a 44 y.o. female.  HPI   43yF with R ear pain. Onset yesterday. Initially itching. Progressed to pain and skin lesions that have drained some. She reports hx of recurrent symptoms and attributes it to shingles. She estimates she has had similar symptoms ~40 times previously. No fever or chills. Has since developed a headache.   Past Medical History:  Diagnosis Date  . Abnormal Pap smear 08/30/2008   LGSIL  . Allergy   . BV (bacterial vaginosis)    recurring  . Chlamydia   . Chronic pain of left knee   . Ectopic pregnancy   . Fibroid   . Infertility    Secondary PCOS  . PCOS (polycystic ovarian syndrome)   . Shingles outbreak 11/2011  . Vertigo     Patient Active Problem List   Diagnosis Date Noted  . Right ankle injury 03/01/2016  . Rash and nonspecific skin eruption 08/11/2015  . Contraceptive management 10/22/2013  . Cold 10/22/2013  . Uterine fibroids, antepartum condition or complication 99991111  . Ruptured ectopic pregnancy 07/05/2011  . Obesity 12/21/2010    Past Surgical History:  Procedure Laterality Date  . DILATION AND CURETTAGE OF UTERUS  06/2011   ectopic  . EAR TUBE REMOVAL    . ECTOPIC PREGNANCY SURGERY    . LAPAROSCOPY  07/05/2011   Procedure: LAPAROSCOPY OPERATIVE;  Surgeon: Osborne Oman, MD;  Location: [facility];  Service: Gynecology;  Laterality: N/A;  . right ear surgery    . WISDOM TOOTH EXTRACTION       OB History    Gravida  5   Para  3   Term  3   Preterm  0   AB  2   Living  3     SAB  1   TAB  0   Ectopic  1   Multiple  0   Live Births  3           Family History  Problem Relation Age of Onset  . Diabetes Mother   . Hypertension Mother   . Cancer Father   . Fibroids Sister   . Hypertension Sister   .  Crohn's disease Brother   . Cancer Brother   . Diabetes Brother   . Hypertension Brother   . Kidney disease Brother   . Anesthesia problems Neg Hx     Social History   Tobacco Use  . Smoking status: Never Smoker  . Smokeless tobacco: Never Used  Substance Use Topics  . Alcohol use: No  . Drug use: No    Home Medications Prior to Admission medications   Medication Sig Start Date End Date Taking? Authorizing Provider  amoxicillin (AMOXIL) 500 MG capsule Take 1 capsule (500 mg total) by mouth 3 (three) times daily. 08/23/19   Orpah Greek, MD  diphenhydrAMINE (BENADRYL) 12.5 MG/5ML liquid Take 12.5 mg by mouth once.  08/01/11  [provider]    Allergies    Philippa Sicks scolymus (artichoke)]  Review of Systems   Review of Systems All systems reviewed and negative, other than as noted in HPI.  Physical Exam Updated Vital Signs BP 126/63 (BP Location: Right Arm)   Pulse 75   Temp 98.1 F (36.7 C) (Oral)   Resp 18  Ht 5\' 4"  (1.626 m)   Wt 107.5 kg   LMP 10/05/2019   SpO2 100%   BMI 40.68 kg/m   Physical Exam Vitals and nursing note reviewed.  Constitutional:      General: She is not in acute distress.    Appearance: She is well-developed.  HENT:     Head: Normocephalic.     Comments: In scaphoid fossa of R ear their is an area with excoriated appearance and some scabbing. Mild localized swelling. No discrete vesicles noted. No proptosis. Ext aud canal clear. No mastoid skin changes or tenderness.  Eyes:     General:        Right eye: No discharge.        Left eye: No discharge.     Conjunctiva/sclera: Conjunctivae normal.  Cardiovascular:     Rate and Rhythm: Normal rate and regular rhythm.     Heart sounds: Normal heart sounds. No murmur. No friction rub. No gallop.   Pulmonary:     Effort: Pulmonary effort is normal. No respiratory distress.     Breath sounds: Normal breath sounds.  Abdominal:     General: There is no distension.      Palpations: Abdomen is soft.     Tenderness: There is no abdominal tenderness.  Musculoskeletal:        General: No tenderness.     Cervical back: Neck supple.  Skin:    General: Skin is warm and dry.  Neurological:     Mental Status: She is alert.     Cranial Nerves: No cranial nerve deficit.  Psychiatric:        Behavior: Behavior normal.        Thought Content: Thought content normal.     ED Results / Procedures / Treatments   Labs (all labs ordered are listed, but only abnormal results are displayed) Labs Reviewed - No data to display  EKG None  Radiology No results found.  Procedures Procedures (including critical care time)  Medications Ordered in ED Medications - No data to display  ED Course  I have reviewed the triage vital signs and the nursing notes.  Pertinent labs & imaging results that were available during my care of the patient were reviewed by me and considered in my medical decision making (see chart for details).    MDM Rules/Calculators/A&P                      43yF with R ear pain. She suspects shingles. It could be. The area looks more excoriated than actually vesicular lesion though. Ext auditory canal looks fine. No involvement outside of ear. No facial nerve palsy.   Will treat for shingles. Could also be impetigo or mild cellulitis. Abx as well.   Final Clinical Impression(s) / ED Diagnoses Final diagnoses:  Ear pain, right    Rx / DC Orders ED Discharge Orders    None       Virgel Manifold, MD 10/05/19 1122

## 2019-10-05 NOTE — ED Triage Notes (Signed)
Pt states that she has been having pain to her right ear, area swollen and has open sore/rash. Pt states she thinks this is her shingles flaring up. States that her ear is sensitive to touch.

## 2019-11-22 ENCOUNTER — Encounter (HOSPITAL_BASED_OUTPATIENT_CLINIC_OR_DEPARTMENT_OTHER): Payer: Self-pay

## 2019-11-22 ENCOUNTER — Other Ambulatory Visit: Payer: Self-pay

## 2019-11-22 ENCOUNTER — Emergency Department (HOSPITAL_BASED_OUTPATIENT_CLINIC_OR_DEPARTMENT_OTHER)
Admission: EM | Admit: 2019-11-22 | Discharge: 2019-11-22 | Disposition: A | Discharge status: Home / Self Care | Payer: Medicaid Other | Attending: Emergency Medicine | Admitting: Emergency Medicine

## 2019-11-22 DIAGNOSIS — R590 Localized enlarged lymph nodes: Secondary | ICD-10-CM | POA: Insufficient documentation

## 2019-11-22 DIAGNOSIS — J0140 Acute pansinusitis, unspecified: Secondary | ICD-10-CM | POA: Diagnosis not present

## 2019-11-22 DIAGNOSIS — R0982 Postnasal drip: Secondary | ICD-10-CM | POA: Diagnosis not present

## 2019-11-22 DIAGNOSIS — R0981 Nasal congestion: Secondary | ICD-10-CM | POA: Diagnosis present

## 2019-11-22 MED ORDER — AMOXICILLIN 500 MG PO CAPS: 500.0000 mg | ORAL_CAPSULE | Freq: Three times a day (TID) | ORAL | 0 refills | Status: AC

## 2019-11-22 MED ORDER — AMOXICILLIN 500 MG PO CAPS
500.0000 mg | ORAL_CAPSULE | Freq: Once | ORAL | Status: AC
  Administered 2019-11-22: 500 mg via ORAL
  Filled 2019-11-22: qty 1

## 2019-11-22 MED ORDER — AFRIN NASAL SPRAY 0.05 % NA SOLN: 1.0000 | Freq: Two times a day (BID) | NASAL | 0 refills | Status: DC | PRN

## 2019-11-22 NOTE — Discharge Instructions (Signed)
Please take all of your antibiotics until finished!   Take your antibiotics with food.  Common side effects of antibiotics include nausea, vomiting, abdominal discomfort, and diarrhea. You may help offset some of this with probiotics which you can buy or get in yogurt. Do not eat  or take the probiotics until 2 hours after your antibiotic.    Some studies suggest that certain antibiotics can reduce the efficacy of certain oral contraceptive pills (birth control), so please use additional contraceptives (condoms or other barrier method) while you are taking the antibiotics and for an additional 5 to 7 days afterwards if you are a female on these medications.  Continue to use Flonase as prescribed daily.  Use Flonase first, then 15 minutes later you can administer Afrin.  Do not use Afrin for more than 3 days in a row.  When you are done using the Afrin, you can continue using the Flonase.  You can continue using over-the-counter medications such as Sudafed or Mucinex, be sure to take Mucinex with a full glass of water.  Alternate between ibuprofen and Tylenol as needed for fever or pain.  Drain plenty of fluids and get rest.  Can also use steam showers or humidifiers.  Return to the emergency department if any concerning signs or symptoms develop such as difficulty breathing or swallowing, loss of consciousness, severe worsening headache, vision changes.

## 2019-11-22 NOTE — ED Provider Notes (Signed)
[facility] EMERGENCY DEPARTMENT Provider Note   CSN: [id number] Arrival date & time: 11/22/19  1750     History Chief Complaint  Patient presents with  . Nasal Congestion    Melissa [name] is a 44 y.o. female presents for evaluation of acute onset, persistent and progressively worsening nasal congestion, sinus pressure, postnasal drip, scratchy throat, frontal headaches, and ear pressure for almost 1 week.  Reports taking multiple over-the-counter medications including Sudafed, Mucinex, Flonase with little relief. No aggravating or alleviating factors noted.  She had a fever up to 17 F a couple of days ago.  Denies chest pain, shortness of breath, wheezing.  No known sick contacts, no known Covid exposures.  Reports that she is tested for Covid regularly at work.  Reports history of frequent ear infections as a child and had tympanostomy tubes.  The history is provided by the patient.       Past Medical History:  Diagnosis Date  . Abnormal Pap smear 08/30/2008   LGSIL  . Allergy   . BV (bacterial vaginosis)    recurring  . Chlamydia   . Chronic pain of left knee   . Ectopic pregnancy   . Fibroid   . Infertility    Secondary PCOS  . PCOS (polycystic ovarian syndrome)   . Shingles outbreak 11/2011  . Vertigo     Patient Active Problem List   Diagnosis Date Noted  . Right ankle injury 03/01/2016  . Rash and nonspecific skin eruption 08/11/2015  . Contraceptive management 10/22/2013  . Cold 10/22/2013  . Uterine fibroids, antepartum condition or complication 11/94/1740  . Ruptured ectopic pregnancy 07/05/2011  . Obesity 12/21/2010    Past Surgical History:  Procedure Laterality Date  . DILATION AND CURETTAGE OF UTERUS  06/2011   ectopic  . EAR TUBE REMOVAL    . ECTOPIC PREGNANCY SURGERY    . LAPAROSCOPY  07/05/2011   Procedure: LAPAROSCOPY OPERATIVE;  Surgeon: Osborne Oman, MD;  Location: [facility];  Service: Gynecology;  Laterality: N/A;  . right  ear surgery    . WISDOM TOOTH EXTRACTION       OB History    Gravida  5   Para  3   Term  3   Preterm  0   AB  2   Living  3     SAB  1   TAB  0   Ectopic  1   Multiple  0   Live Births  3           Family History  Problem Relation Age of Onset  . Diabetes Mother   . Hypertension Mother   . Cancer Father   . Fibroids Sister   . Hypertension Sister   . Crohn's disease Brother   . Cancer Brother   . Diabetes Brother   . Hypertension Brother   . Kidney disease Brother   . Anesthesia problems Neg Hx     Social History   Tobacco Use  . Smoking status: Never Smoker  . Smokeless tobacco: Never Used  Vaping Use  . Vaping Use: Never used  Substance Use Topics  . Alcohol use: No  . Drug use: No    Home Medications Prior to Admission medications   Medication Sig Start Date End Date Taking? Authorizing Provider  acyclovir (ZOVIRAX) 400 MG tablet Take 1 tablet (400 mg total) by mouth 5 (five) times daily. 10/05/19   Virgel Manifold, MD  amoxicillin (AMOXIL) 500 MG capsule Take  1 capsule (500 mg total) by mouth 3 (three) times daily for 7 days. 11/22/19 11/29/19  Rodell Perna A, PA-C  cephALEXin (KEFLEX) 500 MG capsule Take 1 capsule (500 mg total) by mouth 3 (three) times daily. 10/05/19   Virgel Manifold, MD  HYDROcodone-acetaminophen (NORCO/VICODIN) 5-325 MG tablet Take 1-2 tablets by mouth every 6 (six) hours as needed. 10/05/19   Virgel Manifold, MD  oxymetazoline (AFRIN NASAL SPRAY) 0.05 % nasal spray Place 1 spray into both nostrils 2 (two) times daily as needed for congestion. Do not use for longer than 3 days at a time 11/22/19   Rodell Perna A, PA-C  predniSONE (DELTASONE) 20 MG tablet Take 2 tablets (40 mg total) by mouth daily. 10/05/19   Virgel Manifold, MD  diphenhydrAMINE (BENADRYL) 12.5 MG/5ML liquid Take 12.5 mg by mouth once.  08/01/11  [provider]    Allergies    Philippa Sicks scolymus (artichoke)]  Review of Systems   Review of  Systems  Constitutional: Positive for fever.  HENT: Positive for congestion, ear pain, sinus pressure and sinus pain. Negative for ear discharge.   Respiratory: Positive for cough. Negative for shortness of breath.   Cardiovascular: Negative for chest pain.  All other systems reviewed and are negative.   Physical Exam Updated Vital Signs BP (!) 148/87 (BP Location: Right Arm)   Pulse (!) 57   Temp 97.9 F (36.6 C) (Oral)   Resp 18   Ht 5\' 3"  (1.6 m)   Wt 108.9 kg   LMP 11/16/2019   SpO2 100%   BMI 42.51 kg/m   Physical Exam Vitals and nursing note reviewed.  Constitutional:      General: She is not in acute distress.    Appearance: She is well-developed.  HENT:     Head: Normocephalic and atraumatic.     Ears:     Comments: Scarring noted to the bilateral TMs, right worse than left.  No erythema or bulging.      Nose: Congestion present.     Comments: There is frontal and maxillary sinus tenderness bilaterally.    Mouth/Throat:     Mouth: Mucous membranes are moist.     Pharynx: No oropharyngeal exudate or posterior oropharyngeal erythema.     Comments: Postnasal drip noted.  No posterior oropharyngeal erythema, exudates, uvular deviation, trismus or sublingual abnormalities.  Tolerating secretions without difficulty. Eyes:     General:        Right eye: No discharge.        Left eye: No discharge.     Conjunctiva/sclera: Conjunctivae normal.  Neck:     Vascular: No JVD.     Trachea: No tracheal deviation.     Comments: Mild bilateral anterior cervical lymphadenopathy Cardiovascular:     Rate and Rhythm: Normal rate and regular rhythm.     Heart sounds: Normal heart sounds.  Pulmonary:     Effort: Pulmonary effort is normal.     Breath sounds: Normal breath sounds. No wheezing.     Comments: Speaking in full sentences without difficulty Abdominal:     General: There is no distension.  Musculoskeletal:     Cervical back: No rigidity or tenderness.    Lymphadenopathy:     Cervical: Cervical adenopathy present.  Skin:    General: Skin is warm and dry.     Findings: No erythema.  Neurological:     Mental Status: She is alert.  Psychiatric:        Behavior: Behavior normal.  ED Results / Procedures / Treatments   Labs (all labs ordered are listed, but only abnormal results are displayed) Labs Reviewed - No data to display  EKG None  Radiology No results found.  Procedures Procedures (including critical care time)  Medications Ordered in ED Medications  amoxicillin (AMOXIL) capsule 500 mg (has no administration in time range)    ED Course  I have reviewed the triage vital signs and the nursing notes.  Pertinent labs & imaging results that were available during my care of the patient were reviewed by me and considered in my medical decision making (see chart for details).    MDM Rules/Calculators/A&P                          Patient complaining of symptoms of sinusitis.  Patient is afebrile, vital signs are stable.  She is nontoxic in appearance.  She did have a fever at home reportedly.  Severe symptoms have been present for 7days with purulent nasal discharge and frontal and maxillary sinus pain.  Concern for acute bacterial rhinosinusitis.  Patient discharged with prescription for amoxicillin and Afrin.  Discussed appropriate use of medications and that Afrin should not be used for longer than 3 days at a time.  Discussed symptomatic management.  Recommend follow-up with PCP for reevaluation of symptoms.  Discussed strict ED return precautions. Patient verbalized understanding of and agreement with plan and is safe for discharge home at this time.    Final Clinical Impression(s) / ED Diagnoses Final diagnoses:  Acute non-recurrent pansinusitis    Rx / DC Orders ED Discharge Orders         Ordered    amoxicillin (AMOXIL) 500 MG capsule  3 times daily     Discontinue  Reprint     11/22/19 2149    oxymetazoline  (AFRIN NASAL SPRAY) 0.05 % nasal spray  2 times daily PRN     Discontinue  Reprint     11/22/19 2149           Renita Papa, PA-C 11/22/19 2152    Maudie Flakes, MD 11/22/19 [phone]

## 2019-11-22 NOTE — ED Triage Notes (Signed)
Pt reports nasal congestion and ear pain with headache since July 5th.

## 2019-12-22 DIAGNOSIS — Z20822 Contact with and (suspected) exposure to covid-19: Secondary | ICD-10-CM | POA: Diagnosis not present

## 2019-12-24 ENCOUNTER — Other Ambulatory Visit: Payer: Self-pay

## 2019-12-24 ENCOUNTER — Emergency Department (HOSPITAL_BASED_OUTPATIENT_CLINIC_OR_DEPARTMENT_OTHER)
Admission: EM | Admit: 2019-12-24 | Discharge: 2019-12-25 | Disposition: A | Discharge status: Home / Self Care | Payer: Medicaid Other | Attending: Emergency Medicine | Admitting: Emergency Medicine

## 2019-12-24 ENCOUNTER — Encounter (HOSPITAL_BASED_OUTPATIENT_CLINIC_OR_DEPARTMENT_OTHER): Payer: Self-pay | Admitting: *Deleted

## 2019-12-24 DIAGNOSIS — R0982 Postnasal drip: Secondary | ICD-10-CM | POA: Diagnosis not present

## 2019-12-24 DIAGNOSIS — R519 Headache, unspecified: Secondary | ICD-10-CM | POA: Diagnosis not present

## 2019-12-24 DIAGNOSIS — Z79899 Other long term (current) drug therapy: Secondary | ICD-10-CM | POA: Diagnosis not present

## 2019-12-24 DIAGNOSIS — J3489 Other specified disorders of nose and nasal sinuses: Secondary | ICD-10-CM | POA: Diagnosis not present

## 2019-12-24 NOTE — ED Triage Notes (Signed)
Pt c/o sinus pressure and congestion x 1 month

## 2019-12-25 ENCOUNTER — Emergency Department (HOSPITAL_BASED_OUTPATIENT_CLINIC_OR_DEPARTMENT_OTHER): Payer: Medicaid Other

## 2019-12-25 DIAGNOSIS — R519 Headache, unspecified: Secondary | ICD-10-CM | POA: Diagnosis not present

## 2019-12-25 NOTE — ED Provider Notes (Signed)
[facility] Provider Note: Georgena Spurling, MD, FACEP  CSN: [id number] MRN: [id number] ARRIVAL: 12/24/19 at 2018 ROOM: [facility]  Facial Pain   HISTORY OF PRESENT ILLNESS  12/25/19 1:03 AM Melissa [name] is a 44 y.o. female with about a month of nasal congestion, sinus pressure, headache and postnasal drip.  She was seen in the ED on 11/22/2019 and diagnosed with acute on recurrent pansinusitis and placed on Afrin and amoxicillin.  She also has a history of recurrent otitis media but is not currently having ear pain.  She rates the pain in her sinuses and head is a 10 out of 10.  She has taken multiple over-the-counter medications without relief.  The postnasal drip is causing her to be hoarse.  Past Medical History:  Diagnosis Date  . Abnormal Pap smear 08/30/2008   LGSIL  . Allergy   . BV (bacterial vaginosis)    recurring  . Chlamydia   . Chronic pain of left knee   . Ectopic pregnancy   . Fibroid   . Infertility    Secondary PCOS  . PCOS (polycystic ovarian syndrome)   . Shingles outbreak 11/2011  . Vertigo     Past Surgical History:  Procedure Laterality Date  . DILATION AND CURETTAGE OF UTERUS  06/2011   ectopic  . EAR TUBE REMOVAL    . ECTOPIC PREGNANCY SURGERY    . LAPAROSCOPY  07/05/2011   Procedure: LAPAROSCOPY OPERATIVE;  Surgeon: Osborne Oman, MD;  Location: [facility];  Service: Gynecology;  Laterality: N/A;  . right ear surgery    . WISDOM TOOTH EXTRACTION      Family History  Problem Relation Age of Onset  . Diabetes Mother   . Hypertension Mother   . Cancer Father   . Fibroids Sister   . Hypertension Sister   . Crohn's disease Brother   . Cancer Brother   . Diabetes Brother   . Hypertension Brother   . Kidney disease Brother   . Anesthesia problems Neg Hx     Social History   Tobacco Use  . Smoking status: Never Smoker  . Smokeless tobacco: Never Used  Vaping Use  . Vaping Use: Never used  Substance Use  Topics  . Alcohol use: No  . Drug use: No    Prior to Admission medications   Medication Sig Start Date End Date Taking? Authorizing Provider  acyclovir (ZOVIRAX) 400 MG tablet Take 1 tablet (400 mg total) by mouth 5 (five) times daily. 10/05/19   Virgel Manifold, MD  oxymetazoline (AFRIN NASAL SPRAY) 0.05 % nasal spray Place 1 spray into both nostrils 2 (two) times daily as needed for congestion. Do not use for longer than 3 days at a time 11/22/19   Rodell Perna A, PA-C  diphenhydrAMINE (BENADRYL) 12.5 MG/5ML liquid Take 12.5 mg by mouth once.  08/01/11  [provider]    Allergies Philippa Sicks scolymus (artichoke)]   REVIEW OF SYSTEMS  Negative except as noted here or in the History of Present Illness.   PHYSICAL EXAMINATION  Initial Vital Signs Blood pressure 134/77, pulse 62, temperature 98 F (36.7 C), temperature source Oral, resp. rate 14, height 5\' 3"  (1.6 m), weight 107.5 kg, last menstrual period 12/20/2019, SpO2 100 %.  Examination General: Well-developed, well-nourished female in no acute distress; appearance consistent with age of record HENT: normocephalic; atraumatic; scarring of TMs bilaterally without erythema edema or effusion; nasal congestion; no tenderness to percussion of sinuses;  dysphonia Eyes: pupils equal, round and reactive to light; extraocular muscles intact Neck: supple Heart: regular rate and rhythm Lungs: clear to auscultation bilaterally Abdomen: soft; nondistended; nontender; bowel sounds present Extremities: No deformity; full range of motion Neurologic: Awake, alert and oriented; motor function intact in all extremities and symmetric; no facial droop Skin: Warm and dry Psychiatric: Normal mood and affect   RESULTS  Summary of this visit's results, reviewed and interpreted by myself:   EKG Interpretation  Date/Time:    Ventricular Rate:    PR Interval:    QRS Duration:   QT Interval:    QTC Calculation:   R Axis:       Text Interpretation:        Laboratory Studies: No results found for this or any previous visit (from the past 24 hour(s)). Imaging Studies: No results found.  ED COURSE and MDM  Nursing notes, initial and subsequent vitals signs, including pulse oximetry, reviewed and interpreted by myself.  Vitals:   12/24/19 2035 12/24/19 2037 12/24/19 2300  BP:  (!) 144/83 134/77  Pulse:  78 62  Resp:  18 14  Temp:  99.1 F (37.3 C) 98 F (36.7 C)  TempSrc:  Oral Oral  SpO2:  100% 100%  Weight: 107.5 kg    Height: 5\' 3"  (1.6 m)     Medications - No data to display  3:17 AM CT scan results, which did not crossover into epic, negative for acute or chronic sinusitis.  Patient advised of reassuring findings. She was advised she may continue to use over-the-counter nasal medications as needed for symptom control but no prescriptions or antibiotics are indicated at this time.  She has an appoint with Dr. Constance Holster of ENT on August 24.   PROCEDURES  Procedures   ED DIAGNOSES     ICD-10-CM   1. Sinus pressure  J34.89   2. Postnasal drip  R09.82        Shanon Rosser, MD 12/25/19 [phone]

## 2020-01-08 ENCOUNTER — Other Ambulatory Visit: Payer: Self-pay

## 2020-01-08 ENCOUNTER — Encounter (HOSPITAL_BASED_OUTPATIENT_CLINIC_OR_DEPARTMENT_OTHER): Payer: Self-pay | Admitting: *Deleted

## 2020-01-08 ENCOUNTER — Emergency Department (HOSPITAL_BASED_OUTPATIENT_CLINIC_OR_DEPARTMENT_OTHER)
Admission: EM | Admit: 2020-01-08 | Discharge: 2020-01-08 | Disposition: A | Discharge status: Home / Self Care | Payer: Medicaid Other | Attending: Emergency Medicine | Admitting: Emergency Medicine

## 2020-01-08 DIAGNOSIS — N390 Urinary tract infection, site not specified: Secondary | ICD-10-CM | POA: Diagnosis not present

## 2020-01-08 DIAGNOSIS — Z79899 Other long term (current) drug therapy: Secondary | ICD-10-CM | POA: Insufficient documentation

## 2020-01-08 DIAGNOSIS — Z20822 Contact with and (suspected) exposure to covid-19: Secondary | ICD-10-CM | POA: Insufficient documentation

## 2020-01-08 DIAGNOSIS — R5383 Other fatigue: Secondary | ICD-10-CM

## 2020-01-08 DIAGNOSIS — R9431 Abnormal electrocardiogram [ECG] [EKG]: Secondary | ICD-10-CM | POA: Diagnosis not present

## 2020-01-08 DIAGNOSIS — R638 Other symptoms and signs concerning food and fluid intake: Secondary | ICD-10-CM | POA: Insufficient documentation

## 2020-01-08 LAB — CBC WITH DIFFERENTIAL/PLATELET
Abs Immature Granulocytes: 0.02 10*3/uL (ref 0.00–0.07)
Basophils Absolute: 0 10*3/uL (ref 0.0–0.1)
Basophils Relative: 1 %
Eosinophils Absolute: 0.1 10*3/uL (ref 0.0–0.5)
Eosinophils Relative: 3 %
HCT: 39.9 % (ref 36.0–46.0)
Hemoglobin: 12.8 g/dL (ref 12.0–15.0)
Immature Granulocytes: 1 %
Lymphocytes Relative: 39 %
Lymphs Abs: 1.6 10*3/uL (ref 0.7–4.0)
MCH: 28 pg (ref 26.0–34.0)
MCHC: 32.1 g/dL (ref 30.0–36.0)
MCV: 87.3 fL (ref 80.0–100.0)
Monocytes Absolute: 0.4 10*3/uL (ref 0.1–1.0)
Monocytes Relative: 9 %
Neutro Abs: 1.9 10*3/uL (ref 1.7–7.7)
Neutrophils Relative %: 47 %
Platelets: 235 10*3/uL (ref 150–400)
RBC: 4.57 MIL/uL (ref 3.87–5.11)
RDW: 14.6 % (ref 11.5–15.5)
WBC: 4.1 10*3/uL (ref 4.0–10.5)
nRBC: 0 % (ref 0.0–0.2)

## 2020-01-08 LAB — SARS CORONAVIRUS 2 BY RT PCR (HOSPITAL ORDER, PERFORMED IN ~~LOC~~ HOSPITAL LAB): SARS Coronavirus 2: NEGATIVE

## 2020-01-08 LAB — COMPREHENSIVE METABOLIC PANEL
ALT: 12 U/L (ref 0–44)
AST: 16 U/L (ref 15–41)
Albumin: 3.6 g/dL (ref 3.5–5.0)
Alkaline Phosphatase: 38 U/L (ref 38–126)
Anion gap: 10 (ref 5–15)
BUN: 16 mg/dL (ref 6–20)
CO2: 23 mmol/L (ref 22–32)
Calcium: 8.8 mg/dL — ABNORMAL LOW (ref 8.9–10.3)
Chloride: 106 mmol/L (ref 98–111)
Creatinine, Ser: 0.7 mg/dL (ref 0.44–1.00)
GFR calc Af Amer: >60 mL/min (ref >60)
GFR calc non Af Amer: >60 mL/min (ref >60)
Glucose, Bld: 92 mg/dL (ref 70–99)
Potassium: 4 mmol/L (ref 3.5–5.1)
Sodium: 139 mmol/L (ref 135–145)
Total Bilirubin: 0.2 mg/dL — ABNORMAL LOW (ref 0.3–1.2)
Total Protein: 7.2 g/dL (ref 6.5–8.1)

## 2020-01-08 LAB — URINALYSIS, MICROSCOPIC (REFLEX): RBC / HPF: >50 RBC/hpf (ref 0–5)

## 2020-01-08 LAB — URINALYSIS, ROUTINE W REFLEX MICROSCOPIC
Bilirubin Urine: NEGATIVE
Glucose, UA: NEGATIVE mg/dL
Ketones, ur: NEGATIVE mg/dL
Leukocytes,Ua: NEGATIVE
Nitrite: NEGATIVE
Protein, ur: NEGATIVE mg/dL
Specific Gravity, Urine: >1.03 — ABNORMAL HIGH (ref 1.005–1.030)
pH: 5.5 (ref 5.0–8.0)

## 2020-01-08 LAB — PREGNANCY, URINE: Preg Test, Ur: NEGATIVE

## 2020-01-08 MED ORDER — CEPHALEXIN 500 MG PO CAPS: 500.0000 mg | ORAL_CAPSULE | Freq: Three times a day (TID) | ORAL | 0 refills | Status: AC

## 2020-01-08 NOTE — ED Triage Notes (Signed)
Fatigue and feeling dehydrated for the last 3-4 days. Poor appetite.

## 2020-01-08 NOTE — ED Notes (Signed)
Pt on monitor 

## 2020-01-08 NOTE — ED Provider Notes (Signed)
[facility] EMERGENCY DEPARTMENT Provider Note   CSN: [id number] Arrival date & time: 01/08/20  0746     History Chief Complaint  Patient presents with  . Fatigue    Melissa [name] is a 44 y.o. female.  HPI     2-3 days of severe fatigue Normally has a lot of energy Loss of appetite, urinary frequency, no increased thirst No loss of taste or smell, no nausea or vomiting, no diarrhea, no black or bloody stools No dysuria, chest pain or dyspnea, fevers, chills, body aches, cough  No changes in medictions, not on medications    Past Medical History:  Diagnosis Date  . Abnormal Pap smear 08/30/2008   LGSIL  . Allergy   . BV (bacterial vaginosis)    recurring  . Chlamydia   . Chronic pain of left knee   . Ectopic pregnancy   . Fibroid   . Infertility    Secondary PCOS  . PCOS (polycystic ovarian syndrome)   . Shingles outbreak 11/2011  . Vertigo     Patient Active Problem List   Diagnosis Date Noted  . Right ankle injury 03/01/2016  . Rash and nonspecific skin eruption 08/11/2015  . Contraceptive management 10/22/2013  . Cold 10/22/2013  . Uterine fibroids, antepartum condition or complication 47/01/6282  . Ruptured ectopic pregnancy 07/05/2011  . Obesity 12/21/2010    Past Surgical History:  Procedure Laterality Date  . DILATION AND CURETTAGE OF UTERUS  06/2011   ectopic  . EAR TUBE REMOVAL    . ECTOPIC PREGNANCY SURGERY    . LAPAROSCOPY  07/05/2011   Procedure: LAPAROSCOPY OPERATIVE;  Surgeon: Osborne Oman, MD;  Location: [facility];  Service: Gynecology;  Laterality: N/A;  . right ear surgery    . WISDOM TOOTH EXTRACTION       OB History    Gravida  5   Para  3   Term  3   Preterm  0   AB  2   Living  3     SAB  1   TAB  0   Ectopic  1   Multiple  0   Live Births  3           Family History  Problem Relation Age of Onset  . Diabetes Mother   . Hypertension Mother   . Cancer Father   . Fibroids Sister   .  Hypertension Sister   . Crohn's disease Brother   . Cancer Brother   . Diabetes Brother   . Hypertension Brother   . Kidney disease Brother   . Anesthesia problems Neg Hx     Social History   Tobacco Use  . Smoking status: Never Smoker  . Smokeless tobacco: Never Used  Vaping Use  . Vaping Use: Never used  Substance Use Topics  . Alcohol use: No  . Drug use: No    Home Medications Prior to Admission medications   Medication Sig Start Date End Date Taking? Authorizing Provider  ferrous sulfate 325 (65 FE) MG tablet Take 325 mg by mouth daily with breakfast.   Yes [provider]  cephALEXin (KEFLEX) 500 MG capsule Take 1 capsule (500 mg total) by mouth 3 (three) times daily for 7 days. 01/08/20 01/15/20  Gareth Morgan, MD  diphenhydrAMINE (BENADRYL) 12.5 MG/5ML liquid Take 12.5 mg by mouth once.  08/01/11  [provider]    Allergies    Philippa Sicks scolymus (artichoke)]  Review of Systems  Review of Systems  Constitutional: Positive for appetite change and fatigue. Negative for fever.  HENT: Negative for sore throat.   Eyes: Negative for visual disturbance.  Respiratory: Negative for cough and shortness of breath.   Cardiovascular: Negative for chest pain.  Gastrointestinal: Negative for abdominal pain, constipation, diarrhea and vomiting.  Genitourinary: Negative for difficulty urinating.  Musculoskeletal: Negative for back pain and neck pain.  Skin: Negative for rash.  Neurological: Negative for syncope and headaches.    Physical Exam Updated Vital Signs BP (!) 113/49 (BP Location: Right Wrist)   Pulse 61   Temp 98.5 F (36.9 C) (Oral)   Resp (!) 21   Ht 5\' 5"  (1.651 m)   Wt 109.8 kg   LMP 12/20/2019   SpO2 100%   BMI 40.27 kg/m   Physical Exam Vitals and nursing note reviewed.  Constitutional:      General: She is not in acute distress.    Appearance: She is well-developed. She is not diaphoretic.  HENT:     Head:  Normocephalic and atraumatic.  Eyes:     Conjunctiva/sclera: Conjunctivae normal.  Cardiovascular:     Rate and Rhythm: Normal rate and regular rhythm.     Heart sounds: Normal heart sounds. No murmur heard.  No friction rub. No gallop.   Pulmonary:     Effort: Pulmonary effort is normal. No respiratory distress.     Breath sounds: Normal breath sounds. No wheezing or rales.  Abdominal:     General: There is no distension.     Palpations: Abdomen is soft.     Tenderness: There is no abdominal tenderness. There is no guarding.  Musculoskeletal:        General: No tenderness.     Cervical back: Normal range of motion.  Skin:    General: Skin is warm and dry.     Findings: No erythema or rash.  Neurological:     Mental Status: She is alert and oriented to person, place, and time.     ED Results / Procedures / Treatments   Labs (all labs ordered are listed, but only abnormal results are displayed) Labs Reviewed  COMPREHENSIVE METABOLIC PANEL - Abnormal; Notable for the following components:      Result Value   Calcium 8.8 (*)    Total Bilirubin 0.2 (*)    All other components within normal limits  URINALYSIS, ROUTINE W REFLEX MICROSCOPIC - Abnormal; Notable for the following components:   APPearance CLOUDY (*)    Specific Gravity, Urine >1.030 (*)    Hgb urine dipstick LARGE (*)    All other components within normal limits  URINALYSIS, MICROSCOPIC (REFLEX) - Abnormal; Notable for the following components:   Bacteria, UA MANY (*)    All other components within normal limits  SARS CORONAVIRUS 2 BY RT PCR (HOSPITAL ORDER, [facility])  CBC WITH DIFFERENTIAL/PLATELET  PREGNANCY, URINE    EKG EKG Interpretation  Date/Time:  Friday January 08 2020 08:47:12 EDT Ventricular Rate:  64 PR Interval:    QRS Duration: 91 QT Interval:  401 QTC Calculation: 414 R Axis:   69 Text Interpretation: Sinus arrhythmia Since prior ECG< rate has decreased, sinus  arrhythmia present. No other significant changes Confirmed by Gareth Morgan [phone]) on 01/08/2020 10:07:56 AM   Radiology No results found.  Procedures Procedures (including critical care time)  Medications Ordered in ED Medications - No data to display  ED Course  I have reviewed the triage vital  signs and the nursing notes.  Pertinent labs & imaging results that were available during my care of the patient were reviewed by me and considered in my medical decision making (see chart for details).    MDM Rules/Calculators/A&P                          44 year old female with a history of PCOS presents with concern for fatigue and loss of appetite.  Differential diagnosis includes anemia, electrolyte abnormality, cardiac abnormality, infection, hypothyroidism, other toxic/metabolic abnormalities.  No focal neurologic concerns on history or exam to suggest CVA, ICH or other central etiology.  Pt hemodynamically stable, afebrile.  Urinalysis shows many bacteria, but otherwise appears normal. Will treat for UTI and send for culture although overall low suspicion. CBC showed no sign of anemia.  Electrolytes without significant change.  EKG without significant abnormalities, patient does not have chest pain, and have low suspicion for cardiac etiology of symptoms.  No sign of thyroid emergency by vital signs.  COVID testing negative.  Recommend continued outpatient evaluation, supportive care abx for possible UTI. Patient discharged in stable condition with understanding of reasons to return.    Final Clinical Impression(s) / ED Diagnoses Final diagnoses:  Other fatigue  Urinary tract infection with hematuria, site unspecified    Rx / DC Orders ED Discharge Orders         Ordered    cephALEXin (KEFLEX) 500 MG capsule  3 times daily        01/08/20 1010           Gareth Morgan, MD 01/10/20 [phone]

## 2020-01-08 NOTE — ED Notes (Signed)
ED Provider at bedside. 

## 2020-02-19 ENCOUNTER — Encounter (HOSPITAL_BASED_OUTPATIENT_CLINIC_OR_DEPARTMENT_OTHER): Payer: Self-pay | Admitting: Emergency Medicine

## 2020-02-19 ENCOUNTER — Emergency Department (HOSPITAL_BASED_OUTPATIENT_CLINIC_OR_DEPARTMENT_OTHER)
Admission: EM | Admit: 2020-02-19 | Discharge: 2020-02-19 | Disposition: A | Discharge status: Home / Self Care | Payer: Medicaid Other | Attending: Emergency Medicine | Admitting: Emergency Medicine

## 2020-02-19 ENCOUNTER — Other Ambulatory Visit: Payer: Self-pay

## 2020-02-19 DIAGNOSIS — M25531 Pain in right wrist: Secondary | ICD-10-CM | POA: Diagnosis not present

## 2020-02-19 DIAGNOSIS — M25532 Pain in left wrist: Secondary | ICD-10-CM | POA: Insufficient documentation

## 2020-02-19 NOTE — ED Provider Notes (Signed)
[facility] EMERGENCY DEPARTMENT Provider Note   CSN: [id number] Arrival date & time: 02/19/20  0753     History Chief Complaint  Patient presents with  . Hand Pain    bilateral    Melissa [name] is a 44 y.o. female.  The history is provided by the patient and medical records. No language interpreter was used.  Wrist Pain This is a new problem. The current episode started more than 2 days ago. The problem occurs constantly. The problem has been gradually improving. Pertinent negatives include no chest pain, no abdominal pain, no headaches and no shortness of breath. Exacerbated by: gripping and using hands. The symptoms are relieved by rest. Treatments tried: aleve. The treatment provided no relief.       Past Medical History:  Diagnosis Date  . Abnormal Pap smear 08/30/2008   LGSIL  . Allergy   . BV (bacterial vaginosis)    recurring  . Chlamydia   . Chronic pain of left knee   . Ectopic pregnancy   . Fibroid   . Infertility    Secondary PCOS  . PCOS (polycystic ovarian syndrome)   . Shingles outbreak 11/2011  . Vertigo     Patient Active Problem List   Diagnosis Date Noted  . Right ankle injury 03/01/2016  . Rash and nonspecific skin eruption 08/11/2015  . Contraceptive management 10/22/2013  . Cold 10/22/2013  . Uterine fibroids, antepartum condition or complication 17/00/1749  . Ruptured ectopic pregnancy 07/05/2011  . Obesity 12/21/2010    Past Surgical History:  Procedure Laterality Date  . DILATION AND CURETTAGE OF UTERUS  06/2011   ectopic  . EAR TUBE REMOVAL    . ECTOPIC PREGNANCY SURGERY    . LAPAROSCOPY  07/05/2011   Procedure: LAPAROSCOPY OPERATIVE;  Surgeon: Osborne Oman, MD;  Location: [facility];  Service: Gynecology;  Laterality: N/A;  . right ear surgery    . WISDOM TOOTH EXTRACTION       OB History    Gravida  5   Para  3   Term  3   Preterm  0   AB  2   Living  3     SAB  1   TAB  0   Ectopic  1    Multiple  0   Live Births  3           Family History  Problem Relation Age of Onset  . Diabetes Mother   . Hypertension Mother   . Cancer Father   . Fibroids Sister   . Hypertension Sister   . Crohn's disease Brother   . Cancer Brother   . Diabetes Brother   . Hypertension Brother   . Kidney disease Brother   . Anesthesia problems Neg Hx     Social History   Tobacco Use  . Smoking status: Never Smoker  . Smokeless tobacco: Never Used  Vaping Use  . Vaping Use: Never used  Substance Use Topics  . Alcohol use: No  . Drug use: No    Home Medications Prior to Admission medications   Medication Sig Start Date End Date Taking? Authorizing Provider  ferrous sulfate 325 (65 FE) MG tablet Take 325 mg by mouth daily with breakfast.    [provider]  diphenhydrAMINE (BENADRYL) 12.5 MG/5ML liquid Take 12.5 mg by mouth once.  08/01/11  [provider]    Allergies    Philippa Sicks scolymus (artichoke)]  Review of Systems   Review  of Systems  Constitutional: Negative for chills, diaphoresis, fatigue and fever.  HENT: Negative for congestion, ear pain and sore throat.   Eyes: Negative for pain and visual disturbance.  Respiratory: Negative for cough, chest tightness, shortness of breath and wheezing.   Cardiovascular: Negative for chest pain and palpitations.  Gastrointestinal: Negative for abdominal pain, diarrhea, nausea and vomiting.  Genitourinary: Negative for dysuria, flank pain, frequency and hematuria.  Musculoskeletal: Negative for arthralgias, back pain, gait problem, joint swelling, neck pain and neck stiffness.  Skin: Negative for color change, rash and wound.  Neurological: Negative for seizures, syncope, weakness, light-headedness, numbness and headaches.  Psychiatric/Behavioral: Negative for agitation.  All other systems reviewed and are negative.   Physical Exam Updated Vital Signs BP 130/80   Pulse 73   Temp 98.6 F (37 C)  (Oral)   Resp 18   Ht 5\' 4"  (1.626 m)   Wt 108 kg   LMP 02/11/2020   SpO2 99%   BMI 40.85 kg/m   Physical Exam Vitals and nursing note reviewed.  Constitutional:      General: She is not in acute distress.    Appearance: She is well-developed. She is not ill-appearing, toxic-appearing or diaphoretic.  HENT:     Head: Normocephalic and atraumatic.  Eyes:     Extraocular Movements: Extraocular movements intact.     Conjunctiva/sclera: Conjunctivae normal.     Pupils: Pupils are equal, round, and reactive to light.  Cardiovascular:     Rate and Rhythm: Normal rate and regular rhythm.     Heart sounds: No murmur heard.   Pulmonary:     Effort: Pulmonary effort is normal. No respiratory distress.     Breath sounds: Normal breath sounds.  Abdominal:     Palpations: Abdomen is soft.     Tenderness: There is no abdominal tenderness. There is no right CVA tenderness, left CVA tenderness or guarding.  Musculoskeletal:        General: Tenderness present. No swelling, deformity or signs of injury.     Right wrist: Tenderness present. No swelling, deformity, lacerations, snuff box tenderness or crepitus. Normal pulse.     Left wrist: Tenderness present. No swelling, deformity, lacerations, snuff box tenderness or crepitus. Normal pulse.     Cervical back: Neck supple. No rigidity or tenderness.     Right lower leg: No edema.     Comments: Symmetric grip strength and sensation.  Symmetric pulses.  No snuffbox tenderness.  Tenderness in both wrists and hands bilaterally.  Pain with grip strength and movement.  Skin:    General: Skin is warm and dry.     Capillary Refill: Capillary refill takes less than 2 seconds.     Findings: No erythema or rash.  Neurological:     General: No focal deficit present.     Mental Status: She is alert and oriented to person, place, and time.     Sensory: No sensory deficit.     Motor: No weakness.  Psychiatric:        Mood and Affect: Mood normal.      ED Results / Procedures / Treatments   Labs (all labs ordered are listed, but only abnormal results are displayed) Labs Reviewed - No data to display  EKG None  Radiology No results found.  Procedures Procedures (including critical care time)  Medications Ordered in ED Medications - No data to display  ED Course  I have reviewed the triage vital signs and the nursing notes.  Pertinent labs & imaging results that were available during my care of the patient were reviewed by me and considered in my medical decision making (see chart for details).    MDM Rules/Calculators/A&P                          Melissa [name] is a 44 y.o. 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  Patient reports that she has been doing a lot more heavy lifting over the last few weeks with the holiday season ramping up and more letter is being sent.  She says that she has to lift numerous heavy trays of mail up and onto a conveyor belt.  She noticed that some of her colleagues were having wrist pains and she noted she started having wrist pain recently.  She reports no numbness, tingling, or weakness but has decreased grip and capability due to pain limiting.  She denies any snuffbox tenderness bilaterally.  She denies any pain in her elbows, shoulders, chest, back, or neck.  She denies any recent neck injuries.  She describes the pain is moderate to severe.  She has been taking Aleve without significant relief.  She reports that when she was younger she took Tylenol and never helped her much so she has avoided it.  She reports that the pain was worse on Saturday and Sunday and Monday but then she did not work Tuesday Wednesday and her pain started to improve.  She thinks is related to her lifting at work.  On exam, lungs are clear and chest is nontender.  Abdomen and back are nontender.  No elbow tenderness.  Normal  range of motion of all extremities.  Normal radial and ulnar pulses in both lower extremities.  Grip strength is symmetric and limited by pain but she does have symmetric sensation and cap refill as well.  Patient has tenderness of both wrists.  She still denies trauma and there is no laceration or skin changes or swelling.  Clinically I do suspect some overuse causing joint irritation in her wrist bilaterally.  There is no evidence of neurologic compromise at this time.  Patient is interested in getting wrist braces and follow-up with a sports medicine team.  As she reports Aleve does not help, we will have her pivot to ibuprofen and Tylenol and do some icing and wrist bracing.  Patient will follow up with sports medicine.  She still denies any neck injury or neck pain so do not feel there is any more proximal cause of her bilateral symptoms at this time.  Patient agrees with plan of care and will discharge after removable wrist braces are placed.   Final Clinical Impression(s) / ED Diagnoses Final diagnoses:  Pain in both wrists    Rx / DC Orders ED Discharge Orders    None     Clinical Impression: 1. Pain in both wrists     Disposition: Discharge  Condition: Good  I have discussed the results, Dx and Tx plan with the pt(& family if present). He/she/they expressed understanding and agree(s) with the plan. Discharge instructions discussed at great length. Strict return precautions discussed and pt &/or family have verbalized understanding of the instructions. No further questions at time of discharge.    New Prescriptions   No medications on file    Follow Up: Rosemarie Ax, MD [address] [place] [address] [phone]  Call    [facility] DEPARTMENT [address] [phone] [address] [place]       Janan Bogie, Gwenyth Allegra, MD 02/19/20 [phone]

## 2020-02-19 NOTE — ED Triage Notes (Signed)
Bilateral hand pain x 1 day , unknown injury. 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

## 2020-02-19 NOTE — Discharge Instructions (Signed)
Your history and exam today are consistent with bilateral wrist and hand pain is likely related to overuse at work.  As there was no specific trauma, we agreed to hold on irradiating x-rays at this time today.  You had no neurologic compromise or deficits on our exam and there is no concern for infection at this time.  We did not feel there was any neck injury or more proximal cause of your bilateral symptoms.  Please rest and ice the inflamed joints and use the wrist braces to help support them.  Please follow-up with sports medicine for further management.  Please use Motrin and Tylenol to help with inflammation.  If any symptoms change or worsen, please return to the nearest emergency department.

## 2020-02-25 ENCOUNTER — Telehealth: Payer: Self-pay | Admitting: Family Medicine

## 2020-02-25 NOTE — Telephone Encounter (Signed)
Called pt to offer ED follow up appt for wrist pain--left msg for her to call office .  --glh

## 2020-03-27 ENCOUNTER — Emergency Department (HOSPITAL_BASED_OUTPATIENT_CLINIC_OR_DEPARTMENT_OTHER)
Admission: EM | Admit: 2020-03-27 | Discharge: 2020-03-27 | Disposition: A | Discharge status: Home / Self Care | Payer: Medicaid Other | Attending: Emergency Medicine | Admitting: Emergency Medicine

## 2020-03-27 ENCOUNTER — Emergency Department (HOSPITAL_BASED_OUTPATIENT_CLINIC_OR_DEPARTMENT_OTHER): Payer: Medicaid Other

## 2020-03-27 ENCOUNTER — Other Ambulatory Visit: Payer: Self-pay

## 2020-03-27 ENCOUNTER — Encounter (HOSPITAL_BASED_OUTPATIENT_CLINIC_OR_DEPARTMENT_OTHER): Payer: Self-pay | Admitting: Emergency Medicine

## 2020-03-27 DIAGNOSIS — R519 Headache, unspecified: Secondary | ICD-10-CM | POA: Diagnosis not present

## 2020-03-27 DIAGNOSIS — M25531 Pain in right wrist: Secondary | ICD-10-CM

## 2020-03-27 DIAGNOSIS — M25532 Pain in left wrist: Secondary | ICD-10-CM | POA: Diagnosis not present

## 2020-03-27 DIAGNOSIS — M79605 Pain in left leg: Secondary | ICD-10-CM | POA: Insufficient documentation

## 2020-03-27 DIAGNOSIS — H9201 Otalgia, right ear: Secondary | ICD-10-CM | POA: Insufficient documentation

## 2020-03-27 MED ORDER — VALACYCLOVIR HCL 1 G PO TABS: 1000.0000 mg | ORAL_TABLET | Freq: Three times a day (TID) | ORAL | 0 refills | Status: AC

## 2020-03-27 MED ORDER — METHYLPREDNISOLONE 4 MG PO TBPK: ORAL_TABLET | ORAL | 0 refills | Status: DC

## 2020-03-27 MED ORDER — IBUPROFEN 600 MG PO TABS: 600.0000 mg | ORAL_TABLET | Freq: Four times a day (QID) | ORAL | 0 refills | Status: DC | PRN

## 2020-03-27 NOTE — ED Notes (Signed)
Has normal sensation per pt statement, strong plantar and dorsal flexion noted of left foot.

## 2020-03-27 NOTE — ED Triage Notes (Signed)
Headache x 2 days, R wrist pain for several weeks (has been before), L calf pain x 1 week, no known injury.

## 2020-03-27 NOTE — ED Provider Notes (Signed)
[facility] EMERGENCY DEPARTMENT Provider Note   CSN: [id number] Arrival date & time: 03/27/20  1029     History Chief Complaint  Patient presents with  . Headache  . Leg Pain    Melissa [name] is a 44 y.o. female presenting to emergency department with several medical complaints.  The patient reports that she has had intermittent wrist pain for the past several months, on the right side worse than the left.  She was told that she may have "carpal tunnel syndrome" in the past, and is wearing bilateral wrist braces, which is helped with the pain in her left wrist, but the right wrist has been aching worse the past several days.  She works at [employer] and is frequently needing to pick up and flip objects and use her hands.  She says it is worse with activity, and better when she applies ice and muscle rub gels.  She also reports that she has had a persistent headache, which is frontally located and throbbing, for the past 3 days.  It seems to be worse with positioning.  She feels like she slept very poorly as she works third shift at her job.  She also takes care of 3 kids in the daytime.  She reports he is also been having pain in her right ear for the past 3 days.  She is concerned because had multiple shingles outbreaks in the past, and she says this headache is a very typical prodrome.  She has had shingles involving her ear in the past, feels like the earache is similar to that.  She denies any active vesicles.  He says in the past she has been treated with acyclovir.  She finally reports that she has about 3 to 4 weeks of left calf pain and swelling.  This began spontaneously 3 to 4 weeks ago, with no trauma or inciting event.  She does not recall the immediate onset.  It seems to be worse with activity and at work, better while at rest.  It never goes away completely.   No hemoptysis or asymmetric LE edema. Patient denies personal or family history of DVT or PE. No  recent hormone use (including OCP); travel for >6 hours; prolonged immobilization for greater than 3 days; surgeries or trauma in the last 4 weeks; or malignancy with treatment within 6 months.   HPI     Past Medical History:  Diagnosis Date  . Abnormal Pap smear 08/30/2008   LGSIL  . Allergy   . BV (bacterial vaginosis)    recurring  . Chlamydia   . Chronic pain of left knee   . Ectopic pregnancy   . Fibroid   . Infertility    Secondary PCOS  . PCOS (polycystic ovarian syndrome)   . Shingles outbreak 11/2011  . Vertigo     Patient Active Problem List   Diagnosis Date Noted  . Right ankle injury 03/01/2016  . Rash and nonspecific skin eruption 08/11/2015  . Contraceptive management 10/22/2013  . Cold 10/22/2013  . Uterine fibroids, antepartum condition or complication 74/94/4967  . Ruptured ectopic pregnancy 07/05/2011  . Obesity 12/21/2010    Past Surgical History:  Procedure Laterality Date  . DILATION AND CURETTAGE OF UTERUS  06/2011   ectopic  . EAR TUBE REMOVAL    . ECTOPIC PREGNANCY SURGERY    . LAPAROSCOPY  07/05/2011   Procedure: LAPAROSCOPY OPERATIVE;  Surgeon: Osborne Oman, MD;  Location: [facility];  Service: Gynecology;  Laterality: N/A;  . right ear surgery    . WISDOM TOOTH EXTRACTION       OB History    Gravida  5   Para  3   Term  3   Preterm  0   AB  2   Living  3     SAB  1   TAB  0   Ectopic  1   Multiple  0   Live Births  3           Family History  Problem Relation Age of Onset  . Diabetes Mother   . Hypertension Mother   . Cancer Father   . Fibroids Sister   . Hypertension Sister   . Crohn's disease Brother   . Cancer Brother   . Diabetes Brother   . Hypertension Brother   . Kidney disease Brother   . Anesthesia problems Neg Hx     Social History   Tobacco Use  . Smoking status: Never Smoker  . Smokeless tobacco: Never Used  Vaping Use  . Vaping Use: Never used  Substance Use Topics  . Alcohol  use: No  . Drug use: No    Home Medications Prior to Admission medications   Medication Sig Start Date End Date Taking? Authorizing Provider  ferrous sulfate 325 (65 FE) MG tablet Take 325 mg by mouth daily with breakfast.    [provider]  ibuprofen (ADVIL) 600 MG tablet Take 1 tablet (600 mg total) by mouth every 6 (six) hours as needed for up to 30 doses for mild pain or moderate pain. 03/27/20   Wyvonnia Dusky, MD  methylPREDNISolone (MEDROL DOSEPAK) 4 MG TBPK tablet Use as directed on package 03/27/20   Wyvonnia Dusky, MD  valACYclovir (VALTREX) 1000 MG tablet Take 1 tablet (1,000 mg total) by mouth 3 (three) times daily for 7 days. 03/27/20 04/03/20  Wyvonnia Dusky, MD  diphenhydrAMINE (BENADRYL) 12.5 MG/5ML liquid Take 12.5 mg by mouth once.  08/01/11  [provider]    Allergies    Philippa Sicks scolymus (artichoke)]  Review of Systems   Review of Systems  Constitutional: Negative for chills and fever.  HENT: Positive for ear pain and sinus pressure. Negative for ear discharge and sore throat.   Respiratory: Negative for cough and shortness of breath.   Cardiovascular: Negative for chest pain and palpitations.  Gastrointestinal: Negative for abdominal pain and vomiting.  Genitourinary: Negative for dysuria and hematuria.  Musculoskeletal: Positive for arthralgias and myalgias.  Skin: Negative for rash and wound.  Neurological: Positive for headaches. Negative for syncope.  Psychiatric/Behavioral: Negative for agitation and confusion.  All other systems reviewed and are negative.   Physical Exam Updated Vital Signs BP 131/73 (BP Location: Right Arm)   Pulse 75   Temp 99.3 F (37.4 C) (Oral)   Resp 18   Ht 5\' 4"  (1.626 m)   Wt 108 kg   LMP 03/05/2020   SpO2 100%   BMI 40.85 kg/m   Physical Exam Vitals and nursing note reviewed.  Constitutional:      General: She is not in acute distress.    Appearance: She is well-developed.    HENT:     Head: Normocephalic and atraumatic.     Right Ear: Hearing, tympanic membrane, ear canal and external ear normal.     Left Ear: Hearing, tympanic membrane, ear canal and external ear normal.  Eyes:     Conjunctiva/sclera: Conjunctivae normal.  Cardiovascular:  Rate and Rhythm: Normal rate and regular rhythm.     Heart sounds: Normal heart sounds.  Pulmonary:     Effort: Pulmonary effort is normal. No respiratory distress.     Breath sounds: Normal breath sounds.  Abdominal:     General: Bowel sounds are normal.     Palpations: Abdomen is soft.     Tenderness: There is no abdominal tenderness.  Musculoskeletal:        General: No swelling or tenderness.     Cervical back: Neck supple.  Skin:    General: Skin is warm and dry.  Neurological:     Mental Status: She is alert.     GCS: GCS eye subscore is 4. GCS verbal subscore is 5. GCS motor subscore is 6.     Cranial Nerves: No cranial nerve deficit.     Sensory: No sensory deficit.     Motor: No weakness.  Psychiatric:        Mood and Affect: Mood normal.        Behavior: Behavior normal.     ED Results / Procedures / Treatments   Labs (all labs ordered are listed, but only abnormal results are displayed) Labs Reviewed - No data to display  EKG None  Radiology US Venous Img Lower Unilateral Left  Result Date: 03/27/2020 CLINICAL DATA:  Pain EXAM: LEFT LOWER EXTREMITY VENOUS DOPPLER ULTRASOUND TECHNIQUE: Gray-scale sonography with compression, as well as color and duplex ultrasound, were performed to evaluate the deep venous system(s) from the level of the common femoral vein through the popliteal and proximal calf veins. COMPARISON:  None. FINDINGS: VENOUS Normal compressibility of the common femoral, superficial femoral, and popliteal veins, as well as the visualized calf veins. Visualized portions of profunda femoral vein and great saphenous vein unremarkable. No filling defects to suggest DVT on grayscale  or color Doppler imaging. Doppler waveforms show normal direction of venous flow, normal respiratory plasticity and response to augmentation. Limited views of the contralateral common femoral vein are unremarkable. OTHER None. Limitations: none IMPRESSION: No acute DVT. Electronically Signed   By: Valentino Saxon MD   On: 03/27/2020 14:02    Procedures Procedures (including critical care time)  Medications Ordered in ED Medications - No data to display  ED Course  I have reviewed the triage vital signs and the nursing notes.  Pertinent labs & imaging results that were available during my care of the patient were reviewed by me and considered in my medical decision making (see chart for details).  This is a 28-year female presenting with multiple medical complaints.  As noted below.  1. Leg pain, left -Differential diagnosis includes DVT versus muscle spasms versus Baker's cyst versus small muscle tear versus other. -There is no objective swelling in my exam.  She has no signs or symptoms of cellulitis or deep tissue infection. -We will obtain a DVT ultrasound.  Consider NSAIDs for pain if negative.  2.  Headache and ear pain.  -Based on her history with dozens of episodes of shingles outbreaks, I suspect this is likely prodromal headache.  I see no active herpes zoster is a lesion.  However I would be inclined to start treating her with prednisone as well as Valtrex. -I have a low suspicion for meningitis or other acute bacterial infection at this time.  3.  Wrist pain, right > left - This history is highly consistent with carpal tunnel syndrome.  She frequently overuses his joint at work.  Her symptoms improved  with wrist splinting and with conservative measures, including on her left wrist, which is now better. -Unfortunately was no way for her to stop working for prolonged period of time.  However I would recommend continued wrist splints, NSAIDs, and will also consider prednisone to  cover for other issue as well as this.  Medrol Dosepak might be sufficient.  Clinical Course as of Mar 27 1725  Sun Mar 27, 2020  1409 IMPRESSION: No acute DVT.    [MT]    Clinical Course User Index [MT] Wyvonnia Dusky, MD    Final Clinical Impression(s) / ED Diagnoses Final diagnoses:  Left leg pain  Right wrist pain  Right ear pain    Rx / DC Orders ED Discharge Orders         Ordered    valACYclovir (VALTREX) 1000 MG tablet  3 times daily        03/27/20 1411    ibuprofen (ADVIL) 600 MG tablet  Every 6 hours PRN        03/27/20 1411    methylPREDNISolone (MEDROL DOSEPAK) 4 MG TBPK tablet        03/27/20 1411           Wyvonnia Dusky, MD 03/27/20 1726

## 2020-03-27 NOTE — ED Notes (Signed)
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

## 2020-03-27 NOTE — ED Notes (Signed)
Pt in Ultrasound

## 2020-03-27 NOTE — ED Notes (Signed)
Returns from radiology

## 2020-04-19 ENCOUNTER — Other Ambulatory Visit: Payer: Self-pay

## 2020-04-19 ENCOUNTER — Emergency Department (HOSPITAL_BASED_OUTPATIENT_CLINIC_OR_DEPARTMENT_OTHER)
Admission: EM | Admit: 2020-04-19 | Discharge: 2020-04-19 | Disposition: A | Discharge status: Home / Self Care | Payer: Medicaid Other | Attending: Emergency Medicine | Admitting: Emergency Medicine

## 2020-04-19 ENCOUNTER — Encounter (HOSPITAL_BASED_OUTPATIENT_CLINIC_OR_DEPARTMENT_OTHER): Payer: Self-pay

## 2020-04-19 DIAGNOSIS — Z5321 Procedure and treatment not carried out due to patient leaving prior to being seen by health care provider: Secondary | ICD-10-CM | POA: Insufficient documentation

## 2020-04-19 DIAGNOSIS — M79662 Pain in left lower leg: Secondary | ICD-10-CM | POA: Insufficient documentation

## 2020-04-19 NOTE — ED Triage Notes (Signed)
Pt arrives with c/o pain to left calf worsening today.

## 2020-04-21 ENCOUNTER — Other Ambulatory Visit: Payer: Self-pay

## 2020-04-21 ENCOUNTER — Emergency Department (HOSPITAL_BASED_OUTPATIENT_CLINIC_OR_DEPARTMENT_OTHER)
Admission: EM | Admit: 2020-04-21 | Discharge: 2020-04-21 | Disposition: A | Discharge status: Home / Self Care | Payer: Medicaid Other | Attending: Emergency Medicine | Admitting: Emergency Medicine

## 2020-04-21 ENCOUNTER — Encounter (HOSPITAL_BASED_OUTPATIENT_CLINIC_OR_DEPARTMENT_OTHER): Payer: Self-pay | Admitting: *Deleted

## 2020-04-21 DIAGNOSIS — M79662 Pain in left lower leg: Secondary | ICD-10-CM | POA: Insufficient documentation

## 2020-04-21 MED ORDER — KETOROLAC TROMETHAMINE 60 MG/2ML IM SOLN
60.0000 mg | Freq: Once | INTRAMUSCULAR | Status: AC
  Administered 2020-04-21: 60 mg via INTRAMUSCULAR
  Filled 2020-04-21: qty 2

## 2020-04-21 MED ORDER — HYDROCODONE-ACETAMINOPHEN 5-325 MG PO TABS: 2.0000 | ORAL_TABLET | ORAL | 0 refills | Status: DC | PRN

## 2020-04-21 MED ORDER — HYDROCODONE-ACETAMINOPHEN 5-325 MG PO TABS: 1.0000 | ORAL_TABLET | ORAL | 0 refills | Status: AC | PRN

## 2020-04-21 NOTE — ED Triage Notes (Signed)
She had an [place] of her left lower leg last month. That did not show a DVT.  Pain has gotten worse. She now has a fever ? related.

## 2020-04-21 NOTE — ED Provider Notes (Signed)
[facility] EMERGENCY DEPARTMENT Provider Note   CSN: [id number] Arrival date & time: 04/21/20  1707     History Chief Complaint  Patient presents with  . Leg Pain    Melissa [name] is a 44 y.o. female.  HPI 44 year old female with a history of ectopic pregnancy, fibroids, right ankle injury, obesity presents to the ER with left calf pain.  Patient states that she was here approximately a month ago and was evaluated for left calf pain.  She had a ultrasound done which was negative for DVT.  She states that she continued to have some pain, however this did improved.  She stated about 2 days ago, she was at work at the [place] [employer] and felt a pop in her left calf.  She had a sudden severe onset of pain and has had pain with applying pressure.  She has been able to bear weight but has been limping.  She is on her feet a lot at work and this has been causing her a lot of problems.  She states that she has been using Tiger balm, rubbing alcohol, Tylenol and ibuprofen.  States ibuprofen did help some.  Denies any numbness or tingling, but states that she will feel a cramping sensation in her calf when she sleeps at night.  Denies any redness or swelling.  Her leg has been a normal temperature.    Past Medical History:  Diagnosis Date  . Abnormal Pap smear 08/30/2008   LGSIL  . Allergy   . BV (bacterial vaginosis)    recurring  . Chlamydia   . Chronic pain of left knee   . Ectopic pregnancy   . Fibroid   . Infertility    Secondary PCOS  . PCOS (polycystic ovarian syndrome)   . Shingles outbreak 11/2011  . Vertigo     Patient Active Problem List   Diagnosis Date Noted  . Right ankle injury 03/01/2016  . Rash and nonspecific skin eruption 08/11/2015  . Contraceptive management 10/22/2013  . Cold 10/22/2013  . Uterine fibroids, antepartum condition or complication 47/82/9562  . Ruptured ectopic pregnancy 07/05/2011  . Obesity 12/21/2010    Past Surgical History:   Procedure Laterality Date  . DILATION AND CURETTAGE OF UTERUS  06/2011   ectopic  . EAR TUBE REMOVAL    . ECTOPIC PREGNANCY SURGERY    . LAPAROSCOPY  07/05/2011   Procedure: LAPAROSCOPY OPERATIVE;  Surgeon: Osborne Oman, MD;  Location: [facility];  Service: Gynecology;  Laterality: N/A;  . right ear surgery    . WISDOM TOOTH EXTRACTION       OB History    Gravida  5   Para  3   Term  3   Preterm  0   AB  2   Living  3     SAB  1   IAB  0   Ectopic  1   Multiple  0   Live Births  3           Family History  Problem Relation Age of Onset  . Diabetes Mother   . Hypertension Mother   . Cancer Father   . Fibroids Sister   . Hypertension Sister   . Crohn's disease Brother   . Cancer Brother   . Diabetes Brother   . Hypertension Brother   . Kidney disease Brother   . Anesthesia problems Neg Hx     Social History   Tobacco Use  . Smoking  status: Never Smoker  . Smokeless tobacco: Never Used  Vaping Use  . Vaping Use: Never used  Substance Use Topics  . Alcohol use: No  . Drug use: No    Home Medications Prior to Admission medications   Medication Sig Start Date End Date Taking? Authorizing Provider  ibuprofen (ADVIL) 600 MG tablet Take 1 tablet (600 mg total) by mouth every 6 (six) hours as needed for up to 30 doses for mild pain or moderate pain. 03/27/20  Yes Wyvonnia Dusky, MD  ferrous sulfate 325 (65 FE) MG tablet Take 325 mg by mouth daily with breakfast.    [provider]  HYDROcodone-acetaminophen (NORCO/VICODIN) 5-325 MG tablet Take 2 tablets by mouth every 4 (four) hours as needed. 04/21/20   Garald Balding, PA-C  methylPREDNISolone (MEDROL DOSEPAK) 4 MG TBPK tablet Use as directed on package 03/27/20   Wyvonnia Dusky, MD  diphenhydrAMINE (BENADRYL) 12.5 MG/5ML liquid Take 12.5 mg by mouth once.  08/01/11  [provider]    Allergies    Philippa Sicks scolymus (artichoke)]  Review of Systems   Review of  Systems  Constitutional: Negative for chills and fever.  Musculoskeletal: Positive for arthralgias.  Neurological: Negative for weakness and numbness.    Physical Exam Updated Vital Signs BP (!) 142/70   Pulse (!) 57   Temp 98.2 F (36.8 C) (Oral)   Resp 16   Ht 5\' 4"  (1.626 m)   Wt 108 kg   LMP 03/31/2020   SpO2 100%   BMI 40.87 kg/m   Physical Exam Vitals and nursing note reviewed.  Constitutional:      General: She is not in acute distress.    Appearance: She is well-developed and well-nourished.  HENT:     Head: Normocephalic and atraumatic.  Eyes:     Conjunctiva/sclera: Conjunctivae normal.  Cardiovascular:     Rate and Rhythm: Normal rate and regular rhythm.     Pulses: Normal pulses.     Heart sounds: No murmur heard.   Pulmonary:     Effort: Pulmonary effort is normal. No respiratory distress.     Breath sounds: Normal breath sounds.  Abdominal:     Palpations: Abdomen is soft.     Tenderness: There is no abdominal tenderness.  Musculoskeletal:        General: No tenderness, deformity or edema. Normal range of motion.     Cervical back: Neck supple.     Comments: No significant deformity, redness, warmth, swelling to the left calf.  Compartments are soft.  Neurovascularly intact.  Thompson test normal. Gross sensations intact. 4/5 plantarflexion 2/2 to pain, 5/5 dorsiflexion of the left ankle   Skin:    General: Skin is warm and dry.  Neurological:     General: No focal deficit present.     Mental Status: She is alert and oriented to person, place, and time.     Sensory: No sensory deficit.     Motor: No weakness.  Psychiatric:        Mood and Affect: Mood and affect normal.     ED Results / Procedures / Treatments   Labs (all labs ordered are listed, but only abnormal results are displayed) Labs Reviewed - No data to display  EKG None  Radiology No results found.  Procedures Procedures (including critical care time)  Medications Ordered  in ED Medications  ketorolac (TORADOL) injection 60 mg (60 mg Intramuscular Given 04/21/20 2000)    ED Course  I have reviewed the triage vital signs and the nursing notes.  Pertinent labs & imaging results that were available during my care of the patient were reviewed by me and considered in my medical decision making (see chart for details).    MDM Rules/Calculators/A&P                          44 year old female with left calf pain.  On exam, patient is neurovascularly intact, compartments are soft.  Low suspicion for compartment syndrome.  DVT study reviewed from a month ago and was negative.  There is no excessive erythema, warmth or swelling to the calf.  She has a negative Thompson's test.  Offered cam walker, crutches, Toradol here in the ER.  Will refer to Ortho.  Will send short course of pain medicine as the patient is active on her feet.  Pt refused Cam Walker, nursing staff wrapped in ace wrap. Supportive measures with RICE discussed.  PDMP reviewed and is appropriate.  Return precautions discussed.  She was understanding and is agreeable.  At this stage in the ED course, the patient is medically screened and for discharge. Final Clinical Impression(s) / ED Diagnoses Final diagnoses:  Pain of left calf    Rx / DC Orders ED Discharge Orders         Ordered    HYDROcodone-acetaminophen (NORCO/VICODIN) 5-325 MG tablet  Every 4 hours PRN        04/21/20 2003           Lyndel Safe 04/21/20 2006    Charlesetta Shanks, MD 04/22/20 1355

## 2020-04-21 NOTE — ED Notes (Signed)
Pt declined cam boot due to work situation. PA maria aware and placed order for ace wrap.

## 2020-04-21 NOTE — Discharge Instructions (Signed)
Apply a compressive ACE bandage. Rest and elevate the affected painful area.  Apply cold compresses intermittently as needed. Take the pain medicine prescribed as needed.  If you take the Torco make sure to not take Tylenol, take Ibuprofen instead.  Please call the phone number of the orthopedic doctor on your discharge paperwork in order to schedule an appointment.  Return to the ER for any new or worsening symptoms.

## 2020-04-26 ENCOUNTER — Ambulatory Visit: Payer: Medicaid Other | Admitting: Family Medicine

## 2020-04-27 ENCOUNTER — Ambulatory Visit: Payer: Medicaid Other | Admitting: Family

## 2020-04-28 ENCOUNTER — Ambulatory Visit: Payer: Medicaid Other | Admitting: Orthopedic Surgery

## 2020-05-02 DIAGNOSIS — Z20822 Contact with and (suspected) exposure to covid-19: Secondary | ICD-10-CM | POA: Diagnosis not present

## 2020-05-11 ENCOUNTER — Emergency Department (HOSPITAL_BASED_OUTPATIENT_CLINIC_OR_DEPARTMENT_OTHER)
Admission: EM | Admit: 2020-05-11 | Discharge: 2020-05-11 | Disposition: A | Discharge status: Home / Self Care | Payer: Medicaid Other | Attending: Emergency Medicine | Admitting: Emergency Medicine

## 2020-05-11 ENCOUNTER — Telehealth: Payer: Self-pay

## 2020-05-11 ENCOUNTER — Encounter (HOSPITAL_BASED_OUTPATIENT_CLINIC_OR_DEPARTMENT_OTHER): Payer: Self-pay

## 2020-05-11 ENCOUNTER — Other Ambulatory Visit: Payer: Self-pay

## 2020-05-11 DIAGNOSIS — R109 Unspecified abdominal pain: Secondary | ICD-10-CM | POA: Insufficient documentation

## 2020-05-11 DIAGNOSIS — Z5321 Procedure and treatment not carried out due to patient leaving prior to being seen by health care provider: Secondary | ICD-10-CM | POA: Diagnosis not present

## 2020-05-11 DIAGNOSIS — R112 Nausea with vomiting, unspecified: Secondary | ICD-10-CM | POA: Diagnosis not present

## 2020-05-11 DIAGNOSIS — R143 Flatulence: Secondary | ICD-10-CM | POA: Diagnosis not present

## 2020-05-11 LAB — URINALYSIS, ROUTINE W REFLEX MICROSCOPIC
Bilirubin Urine: NEGATIVE
Glucose, UA: NEGATIVE mg/dL
Hgb urine dipstick: NEGATIVE
Ketones, ur: NEGATIVE mg/dL
Leukocytes,Ua: NEGATIVE
Nitrite: NEGATIVE
Protein, ur: NEGATIVE mg/dL
Specific Gravity, Urine: 1.02 (ref 1.005–1.030)
pH: 6 (ref 5.0–8.0)

## 2020-05-11 LAB — CBC
HCT: 42.7 % (ref 36.0–46.0)
Hemoglobin: 13.7 g/dL (ref 12.0–15.0)
MCH: 28.1 pg (ref 26.0–34.0)
MCHC: 32.1 g/dL (ref 30.0–36.0)
MCV: 87.5 fL (ref 80.0–100.0)
Platelets: 330 10*3/uL (ref 150–400)
RBC: 4.88 MIL/uL (ref 3.87–5.11)
RDW: 14.3 % (ref 11.5–15.5)
WBC: 4.6 10*3/uL (ref 4.0–10.5)
nRBC: 0 % (ref 0.0–0.2)

## 2020-05-11 LAB — COMPREHENSIVE METABOLIC PANEL
ALT: 14 U/L (ref 0–44)
AST: 18 U/L (ref 15–41)
Albumin: 4 g/dL (ref 3.5–5.0)
Alkaline Phosphatase: 47 U/L (ref 38–126)
Anion gap: 11 (ref 5–15)
BUN: 20 mg/dL (ref 6–20)
CO2: 24 mmol/L (ref 22–32)
Calcium: 9.3 mg/dL (ref 8.9–10.3)
Chloride: 101 mmol/L (ref 98–111)
Creatinine, Ser: 0.83 mg/dL (ref 0.44–1.00)
GFR, Estimated: >60 mL/min (ref >60)
Glucose, Bld: 90 mg/dL (ref 70–99)
Potassium: 3.8 mmol/L (ref 3.5–5.1)
Sodium: 136 mmol/L (ref 135–145)
Total Bilirubin: 0.2 mg/dL — ABNORMAL LOW (ref 0.3–1.2)
Total Protein: 7.6 g/dL (ref 6.5–8.1)

## 2020-05-11 LAB — PREGNANCY, URINE: Preg Test, Ur: NEGATIVE

## 2020-05-11 LAB — LIPASE, BLOOD: Lipase: 31 U/L (ref 11–51)

## 2020-05-11 NOTE — Telephone Encounter (Signed)
Patient sent an e-mail request to establish care here at our office. Called and left a voicemail to give the office a call back to schedule.

## 2020-05-11 NOTE — ED Triage Notes (Signed)
Pt c/o abd cramps-, n/v, increase gas x 1 week-NAD-steady gait

## 2020-05-12 ENCOUNTER — Emergency Department (HOSPITAL_BASED_OUTPATIENT_CLINIC_OR_DEPARTMENT_OTHER): Payer: Medicaid Other

## 2020-05-12 ENCOUNTER — Emergency Department (HOSPITAL_BASED_OUTPATIENT_CLINIC_OR_DEPARTMENT_OTHER)
Admission: EM | Admit: 2020-05-12 | Discharge: 2020-05-12 | Disposition: A | Discharge status: Home / Self Care | Payer: Medicaid Other | Attending: Emergency Medicine | Admitting: Emergency Medicine

## 2020-05-12 ENCOUNTER — Other Ambulatory Visit: Payer: Self-pay

## 2020-05-12 ENCOUNTER — Encounter (HOSPITAL_BASED_OUTPATIENT_CLINIC_OR_DEPARTMENT_OTHER): Payer: Self-pay | Admitting: *Deleted

## 2020-05-12 DIAGNOSIS — R509 Fever, unspecified: Secondary | ICD-10-CM | POA: Insufficient documentation

## 2020-05-12 DIAGNOSIS — R1084 Generalized abdominal pain: Secondary | ICD-10-CM | POA: Diagnosis not present

## 2020-05-12 DIAGNOSIS — R1110 Vomiting, unspecified: Secondary | ICD-10-CM | POA: Diagnosis not present

## 2020-05-12 DIAGNOSIS — R109 Unspecified abdominal pain: Secondary | ICD-10-CM

## 2020-05-12 DIAGNOSIS — D373 Neoplasm of uncertain behavior of appendix: Secondary | ICD-10-CM | POA: Insufficient documentation

## 2020-05-12 DIAGNOSIS — Z5331 Laparoscopic surgical procedure converted to open procedure: Secondary | ICD-10-CM | POA: Diagnosis not present

## 2020-05-12 DIAGNOSIS — R112 Nausea with vomiting, unspecified: Secondary | ICD-10-CM | POA: Diagnosis not present

## 2020-05-12 LAB — COMPREHENSIVE METABOLIC PANEL
ALT: 13 U/L (ref 0–44)
AST: 17 U/L (ref 15–41)
Albumin: 3.9 g/dL (ref 3.5–5.0)
Alkaline Phosphatase: 44 U/L (ref 38–126)
Anion gap: 10 (ref 5–15)
BUN: 16 mg/dL (ref 6–20)
CO2: 26 mmol/L (ref 22–32)
Calcium: 9 mg/dL (ref 8.9–10.3)
Chloride: 101 mmol/L (ref 98–111)
Creatinine, Ser: 0.75 mg/dL (ref 0.44–1.00)
GFR, Estimated: >60 mL/min (ref >60)
Glucose, Bld: 86 mg/dL (ref 70–99)
Potassium: 3.8 mmol/L (ref 3.5–5.1)
Sodium: 137 mmol/L (ref 135–145)
Total Bilirubin: 0.7 mg/dL (ref 0.3–1.2)
Total Protein: 7.3 g/dL (ref 6.5–8.1)

## 2020-05-12 LAB — CBC WITH DIFFERENTIAL/PLATELET
Abs Immature Granulocytes: 0.01 10*3/uL (ref 0.00–0.07)
Basophils Absolute: 0 10*3/uL (ref 0.0–0.1)
Basophils Relative: 0 %
Eosinophils Absolute: 0.1 10*3/uL (ref 0.0–0.5)
Eosinophils Relative: 2 %
HCT: 41.2 % (ref 36.0–46.0)
Hemoglobin: 13.1 g/dL (ref 12.0–15.0)
Immature Granulocytes: 0 %
Lymphocytes Relative: 38 %
Lymphs Abs: 1.8 10*3/uL (ref 0.7–4.0)
MCH: 27.9 pg (ref 26.0–34.0)
MCHC: 31.8 g/dL (ref 30.0–36.0)
MCV: 87.7 fL (ref 80.0–100.0)
Monocytes Absolute: 0.6 10*3/uL (ref 0.1–1.0)
Monocytes Relative: 12 %
Neutro Abs: 2.2 10*3/uL (ref 1.7–7.7)
Neutrophils Relative %: 48 %
Platelets: 332 10*3/uL (ref 150–400)
RBC: 4.7 MIL/uL (ref 3.87–5.11)
RDW: 14.3 % (ref 11.5–15.5)
WBC: 4.6 10*3/uL (ref 4.0–10.5)
nRBC: 0 % (ref 0.0–0.2)

## 2020-05-12 LAB — LIPASE, BLOOD: Lipase: 51 U/L (ref 11–51)

## 2020-05-12 LAB — PREGNANCY, URINE: Preg Test, Ur: NEGATIVE

## 2020-05-12 LAB — OCCULT BLOOD X 1 CARD TO LAB, STOOL: Fecal Occult Bld: NEGATIVE

## 2020-05-12 MED ORDER — DICYCLOMINE HCL 10 MG PO CAPS
20.0000 mg | ORAL_CAPSULE | Freq: Once | ORAL | Status: AC
  Administered 2020-05-12: 20 mg via ORAL
  Filled 2020-05-12: qty 2

## 2020-05-12 MED ORDER — ONDANSETRON HCL 4 MG/2ML IJ SOLN
4.0000 mg | Freq: Once | INTRAMUSCULAR | Status: AC
  Administered 2020-05-12: 4 mg via INTRAVENOUS
  Filled 2020-05-12: qty 2

## 2020-05-12 MED ORDER — ONDANSETRON 4 MG PO TBDP: 4.0000 mg | ORAL_TABLET | Freq: Three times a day (TID) | ORAL | 0 refills | Status: DC | PRN

## 2020-05-12 MED ORDER — IOHEXOL 300 MG/ML  SOLN
100.0000 mL | Freq: Once | INTRAMUSCULAR | Status: AC | PRN
  Administered 2020-05-12: 100 mL via INTRAVENOUS

## 2020-05-12 MED ORDER — DICYCLOMINE HCL 20 MG PO TABS: 20.0000 mg | ORAL_TABLET | Freq: Two times a day (BID) | ORAL | 0 refills | Status: DC

## 2020-05-12 NOTE — ED Provider Notes (Signed)
[facility] EMERGENCY DEPARTMENT Provider Note   CSN: [id number] Arrival date & time: 05/12/20  1311     History Chief Complaint  Patient presents with  . Abdominal Pain    Melissa [name] is a 44 y.o. female.  HPI      Melissa [name] is a 44 y.o. female, presenting to the ED with abdominal discomfort for the last week. She endorses intermittent, generalized abdominal cramping, no radiation from the abdomen, 4/10.  Pain tends to occur with eating. Accompanied by belching throughout the day. She endorses one episode of fever 3 days ago, but none since. 5 days ago, she had instance of bright red blood in the stool, but none since. She has an appointment with [facility] in Feb.  Denies chest pain, shortness of breath, syncope, urinary symptoms, vomiting, or any other complaints.  Past Medical History:  Diagnosis Date  . Abnormal Pap smear 08/30/2008   LGSIL  . Allergy   . BV (bacterial vaginosis)    recurring  . Chlamydia   . Chronic pain of left knee   . Ectopic pregnancy   . Fibroid   . Infertility    Secondary PCOS  . PCOS (polycystic ovarian syndrome)   . Shingles outbreak 11/2011  . Vertigo     Patient Active Problem List   Diagnosis Date Noted  . Right ankle injury 03/01/2016  . Rash and nonspecific skin eruption 08/11/2015  . Contraceptive management 10/22/2013  . Cold 10/22/2013  . Uterine fibroids, antepartum condition or complication 02/06/2012  . Ruptured ectopic pregnancy 07/05/2011  . Obesity 12/21/2010    Past Surgical History:  Procedure Laterality Date  . DILATION AND CURETTAGE OF UTERUS  06/2011   ectopic  . EAR TUBE REMOVAL    . ECTOPIC PREGNANCY SURGERY    . LAPAROSCOPY  07/05/2011   Procedure: LAPAROSCOPY OPERATIVE;  Surgeon: Tereso Newcomer, MD;  Location: [facility];  Service: Gynecology;  Laterality: N/A;  . right ear surgery    . WISDOM TOOTH EXTRACTION       OB History    Gravida  5   Para  3   Term  3   Preterm   0   AB  2   Living  3     SAB  1   IAB  0   Ectopic  1   Multiple  0   Live Births  3           Family History  Problem Relation Age of Onset  . Diabetes Mother   . Hypertension Mother   . Cancer Father   . Fibroids Sister   . Hypertension Sister   . Crohn's disease Brother   . Cancer Brother   . Diabetes Brother   . Hypertension Brother   . Kidney disease Brother   . Anesthesia problems Neg Hx     Social History   Tobacco Use  . Smoking status: Never Smoker  . Smokeless tobacco: Never Used  Vaping Use  . Vaping Use: Never used  Substance Use Topics  . Alcohol use: No  . Drug use: No    Home Medications Prior to Admission medications   Medication Sig Start Date End Date Taking? Authorizing Provider  dicyclomine (BENTYL) 20 MG tablet Take 1 tablet (20 mg total) by mouth 2 (two) times daily. 05/12/20  Yes Dandrea Medders C, PA-C  ondansetron (ZOFRAN ODT) 4 MG disintegrating tablet Take 1 tablet (4 mg total) by mouth every 8 (eight)  hours as needed for nausea or vomiting. 05/12/20  Yes Haylin Camilli C, PA-C  ferrous sulfate 325 (65 FE) MG tablet Take 325 mg by mouth daily with breakfast.    [provider]  ibuprofen (ADVIL) 600 MG tablet Take 1 tablet (600 mg total) by mouth every 6 (six) hours as needed for up to 30 doses for mild pain or moderate pain. 03/27/20   Wyvonnia Dusky, MD  methylPREDNISolone (MEDROL DOSEPAK) 4 MG TBPK tablet Use as directed on package 03/27/20   Wyvonnia Dusky, MD  diphenhydrAMINE (BENADRYL) 12.5 MG/5ML liquid Take 12.5 mg by mouth once.  08/01/11  [provider]    Allergies    Philippa Sicks scolymus (artichoke)]  Review of Systems   Review of Systems  Respiratory: Negative for cough and shortness of breath.   Cardiovascular: Negative for chest pain.  Gastrointestinal: Positive for abdominal pain, blood in stool and nausea. Negative for diarrhea and vomiting.  Genitourinary: Negative for dysuria,  flank pain and frequency.  Musculoskeletal: Negative for back pain.  Neurological: Negative for dizziness, syncope and weakness.  All other systems reviewed and are negative.   Physical Exam Updated Vital Signs BP (!) 145/84 (BP Location: Right Arm)   Pulse 64   Temp 98.1 F (36.7 C) (Oral)   Resp 18   Ht 5\' 4"  (1.626 m)   Wt 114.3 kg   LMP 04/26/2020   SpO2 100%   BMI 43.26 kg/m   Physical Exam Vitals and nursing note reviewed.  Constitutional:      General: She is not in acute distress.    Appearance: She is well-developed. She is not diaphoretic.  HENT:     Head: Normocephalic and atraumatic.     Mouth/Throat:     Mouth: Mucous membranes are moist.     Pharynx: Oropharynx is clear.  Eyes:     Conjunctiva/sclera: Conjunctivae normal.  Cardiovascular:     Rate and Rhythm: Normal rate and regular rhythm.     Pulses: Normal pulses.          Radial pulses are 2+ on the right side and 2+ on the left side.       Posterior tibial pulses are 2+ on the right side and 2+ on the left side.     Heart sounds: Normal heart sounds.  Pulmonary:     Effort: Pulmonary effort is normal. No respiratory distress.     Breath sounds: Normal breath sounds.  Abdominal:     Palpations: Abdomen is soft.     Tenderness: There is abdominal tenderness in the epigastric area, periumbilical area, suprapubic area and left lower quadrant. There is no guarding.     Comments: Abdominal exam is rather benign.  No point tenderness on the right side of abdomen, specifically no point tenderness in the right upper quadrant or right lower quadrant.  Genitourinary:    Rectum: Guaiac result negative.     Comments: Rectal Exam:  No external hemorrhoids, fissures, or lesions noted.  No frank blood or melena. No stool burden.  No rectal tenderness. No foreign bodies noted.   RN, Duncan Dull, served as chaperone during the rectal exam. Musculoskeletal:     Cervical back: Neck supple.  Skin:    General: Skin is  warm and dry.  Neurological:     Mental Status: She is alert.  Psychiatric:        Mood and Affect: Mood and affect normal.        Speech: Speech  normal.        Behavior: Behavior normal.     ED Results / Procedures / Treatments   Labs (all labs ordered are listed, but only abnormal results are displayed) Labs Reviewed  COMPREHENSIVE METABOLIC PANEL  LIPASE, BLOOD  CBC WITH DIFFERENTIAL/PLATELET  PREGNANCY, URINE  OCCULT BLOOD X 1 CARD TO LAB, STOOL    EKG None  Radiology CT ABDOMEN PELVIS W CONTRAST  Result Date: 05/12/2020 CLINICAL DATA:  Diverticulitis suspected Bowel obstruction suspected Nausea and vomiting.  Abdominal cramping. EXAM: CT ABDOMEN AND PELVIS WITH CONTRAST TECHNIQUE: Multidetector CT imaging of the abdomen and pelvis was performed using the standard protocol following bolus administration of intravenous contrast. CONTRAST:  120mL OMNIPAQUE IOHEXOL 300 MG/ML  SOLN COMPARISON:  CT 12/24/2017 FINDINGS: Lower chest: Lung bases are clear. No focal airspace disease or pleural effusion. Hepatobiliary: No focal liver abnormality is seen. No gallstones, gallbladder wall thickening, or biliary dilatation. Pancreas: No ductal dilatation or inflammation. Spleen: Normal in size without focal abnormality. Adrenals/Urinary Tract: No adrenal nodule. No hydronephrosis or perinephric edema. Homogeneous renal enhancement with symmetric excretion on delayed phase imaging. Urinary bladder is partially distended without wall thickening. Stomach/Bowel: Progressive appendiceal dilatation with low-density material, current appendiceal dimension 2.5 cm, series 2, image 45. Peripheral hyperdensity distally may represent calcification or less likely enteric contrast from prior radiologic exam. There is also some proximal nodular hyperdensity, calcification versus appendicolith. No surrounding periappendiceal fat stranding. Decompressed stomach. Normal positioning of the duodenum and ligament of  Treitz. There is no small bowel obstruction or abnormal distention. No small bowel inflammatory change. Moderate stool burden in the colon. There is mild sigmoid colonic tortuosity. No significant diverticular disease. No colonic wall thickening or inflammation. Vascular/Lymphatic: Non-specific 8 mm ileocolic node, series 2, image 41. There are small retroperitoneal nodes are not enlarged by size criteria. Normal caliber abdominal aorta. Patent portal vein. Normal caliber abdominal aorta. Reproductive: Enlarged bulky fibroid uterus. There is a dominant fibroid involving the left fundus measuring approximately 5.9 x 5.3 cm. Fibroid about the right lower uterine body measuring 5.6 cm. There is a coarse calcification in the left uterine fundus likely related to fibroid. Ovaries are not discretely visualized, there is no adnexal mass. Other: No ascites. There is no omental thickening. No free air. Tiny fat containing umbilical hernia. 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 Degenerative change of the pubic symphysis. There are no acute or suspicious osseous abnormalities. IMPRESSION: 1. Progressive appendiceal dilatation with low-density material since 2019, current appendiceal dimension 2.5 cm. There is both proximal and distal hyperdensity in the appendix which likely represents calcification rather than appendicoliths. 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 Recommend surgical consultation. 2. Non-specific 8 mm ileocolic node. 3. Enlarged bulky fibroid uterus. Electronically Signed   By: Keith Rake M.D.   On: 05/12/2020 20:47    Procedures Procedures (including critical care time)  Medications Ordered in ED Medications  dicyclomine (BENTYL) capsule 20 mg (has no administration in time range)  ondansetron (ZOFRAN) injection 4 mg (4 mg Intravenous Given 05/12/20 1959)  iohexol (OMNIPAQUE) 300 MG/ML solution 100 mL (100  mLs Intravenous Contrast Given 05/12/20 2024)    ED Course  I have reviewed the triage vital signs and the nursing notes.  Pertinent labs & imaging results that were available during my care of the patient were reviewed by me and considered in my medical decision making (see chart for details).  Clinical Course as of 05/12/20  2306  Thu May 12, 2020  2116 Spoke with Dr. Dwain Sarna, general surgery.  We reviewed the patient's symptoms, duration, physical exam, lab results, and CT read. He reviewed the CT images himself as well. He agrees that this patient does not sound like an acute appendicitis.  If she is clinically not presenting as appendicitis, then she may be discharged, however, she needs to follow-up in the office as soon as possible.  She should call the office on Monday to make an appointment. [SJ]    Clinical Course User Index [SJ] Logan Baltimore C, PA-C   MDM Rules/Calculators/A&P                          Patient presents with abdominal cramping and belching for the last week. Patient is nontoxic appearing, afebrile, not tachycardic, not tachypneic, not hypotensive, maintains excellent SPO2 on room air, and is in no apparent distress.   I personally reviewed and interpreted the patient's labs and imaging studies. No leukocytosis.  In fact, no acute abnormalities on any of her lab work.  CT with findings suspicious for possible appendix mass.  She will need to follow-up with general surgery on this matter.  Return precautions discussed.  Patient voices understanding of these instructions, accepts the plan, and is comfortable with discharge.  Findings and plan of care discussed with attending physician, Theda Belfast, MD.   Final Clinical Impression(s) / ED Diagnoses Final diagnoses:  Abdominal cramping    Rx / DC Orders ED Discharge Orders         Ordered    dicyclomine (BENTYL) 20 MG tablet  2 times daily        05/12/20 2237    ondansetron (ZOFRAN ODT) 4 MG  disintegrating tablet  Every 8 hours PRN        05/12/20 2237           Anselm Pancoast, PA-C 05/12/20 2306    Tegeler, Canary Brim, MD 05/13/20 [phone]

## 2020-05-12 NOTE — Discharge Instructions (Signed)
It is of the utmost importance that you follow-up with the general surgery office.  Call the office on Monday to set up an appointment to be seen as soon as possible.  Nausea/vomiting: Use the ondansetron (generic for Zofran) for nausea or vomiting.  This medication may not prevent all vomiting or nausea, but can help facilitate better hydration. Things that can help with nausea/vomiting also include peppermint/menthol candies, vitamin B12, and ginger. Dicyclomine: Dicyclomine (generic for Bentyl) is what is known as an antispasmodic and is intended to help reduce abdominal discomfort.  Return: Return to the emergency department for significantly worsening pain, persistent vomiting, or any other major concerns.

## 2020-05-12 NOTE — ED Notes (Signed)
Pt NPO until further notice 

## 2020-05-12 NOTE — ED Triage Notes (Signed)
Pt c/o abd cramps-, n/v, increase gas x 1 week-NAD-steady gait °

## 2020-05-12 NOTE — ED Notes (Addendum)
Chaperoned Shawn, PA with rectal exam. Pt tolerated well.

## 2020-05-12 NOTE — ED Notes (Signed)
Have abd pain, cramps, n/v with a lot of belching. Most the pain will occur after eating. Pt still has gallbladder.

## 2020-05-14 HISTORY — PX: APPENDECTOMY: SHX54

## 2020-05-19 DIAGNOSIS — D373 Neoplasm of uncertain behavior of appendix: Secondary | ICD-10-CM | POA: Diagnosis not present

## 2020-05-23 ENCOUNTER — Other Ambulatory Visit: Payer: Self-pay | Admitting: Emergency Medicine

## 2020-05-23 DIAGNOSIS — Z1231 Encounter for screening mammogram for malignant neoplasm of breast: Secondary | ICD-10-CM

## 2020-05-25 DIAGNOSIS — Z1231 Encounter for screening mammogram for malignant neoplasm of breast: Secondary | ICD-10-CM

## 2020-05-30 ENCOUNTER — Encounter (HOSPITAL_BASED_OUTPATIENT_CLINIC_OR_DEPARTMENT_OTHER): Payer: Medicaid Other

## 2020-05-30 DIAGNOSIS — Z1231 Encounter for screening mammogram for malignant neoplasm of breast: Secondary | ICD-10-CM

## 2020-06-02 DIAGNOSIS — Z20828 Contact with and (suspected) exposure to other viral communicable diseases: Secondary | ICD-10-CM | POA: Diagnosis not present

## 2020-06-06 ENCOUNTER — Encounter (HOSPITAL_BASED_OUTPATIENT_CLINIC_OR_DEPARTMENT_OTHER): Payer: Medicaid Other

## 2020-06-06 DIAGNOSIS — Z1231 Encounter for screening mammogram for malignant neoplasm of breast: Secondary | ICD-10-CM

## 2020-06-09 ENCOUNTER — Encounter: Payer: Self-pay | Admitting: Family Medicine

## 2020-06-12 DIAGNOSIS — U071 COVID-19: Secondary | ICD-10-CM | POA: Diagnosis not present

## 2020-06-12 DIAGNOSIS — R079 Chest pain, unspecified: Secondary | ICD-10-CM | POA: Diagnosis not present

## 2020-06-12 DIAGNOSIS — R059 Cough, unspecified: Secondary | ICD-10-CM | POA: Diagnosis not present

## 2020-06-12 DIAGNOSIS — R0789 Other chest pain: Secondary | ICD-10-CM | POA: Diagnosis not present

## 2020-06-13 ENCOUNTER — Other Ambulatory Visit: Payer: Self-pay

## 2020-06-14 ENCOUNTER — Other Ambulatory Visit: Payer: Self-pay

## 2020-06-15 ENCOUNTER — Ambulatory Visit: Payer: Medicaid Other | Admitting: Family Medicine

## 2020-06-15 DIAGNOSIS — K625 Hemorrhage of anus and rectum: Secondary | ICD-10-CM | POA: Diagnosis not present

## 2020-06-15 DIAGNOSIS — R112 Nausea with vomiting, unspecified: Secondary | ICD-10-CM | POA: Diagnosis not present

## 2020-06-15 DIAGNOSIS — K5909 Other constipation: Secondary | ICD-10-CM | POA: Diagnosis not present

## 2020-06-15 DIAGNOSIS — K219 Gastro-esophageal reflux disease without esophagitis: Secondary | ICD-10-CM | POA: Diagnosis not present

## 2020-06-15 DIAGNOSIS — R1084 Generalized abdominal pain: Secondary | ICD-10-CM | POA: Diagnosis not present

## 2020-06-15 DIAGNOSIS — R9389 Abnormal findings on diagnostic imaging of other specified body structures: Secondary | ICD-10-CM | POA: Diagnosis not present

## 2020-06-16 DIAGNOSIS — Z20822 Contact with and (suspected) exposure to covid-19: Secondary | ICD-10-CM | POA: Diagnosis not present

## 2020-06-18 DIAGNOSIS — Z20822 Contact with and (suspected) exposure to covid-19: Secondary | ICD-10-CM | POA: Diagnosis not present

## 2020-06-20 ENCOUNTER — Other Ambulatory Visit (HOSPITAL_BASED_OUTPATIENT_CLINIC_OR_DEPARTMENT_OTHER): Payer: Self-pay

## 2020-06-20 ENCOUNTER — Encounter (HOSPITAL_BASED_OUTPATIENT_CLINIC_OR_DEPARTMENT_OTHER): Payer: Self-pay

## 2020-06-20 ENCOUNTER — Other Ambulatory Visit: Payer: Self-pay

## 2020-06-20 ENCOUNTER — Ambulatory Visit (HOSPITAL_BASED_OUTPATIENT_CLINIC_OR_DEPARTMENT_OTHER)
Admission: RE | Admit: 2020-06-20 | Discharge: 2020-06-20 | Disposition: A | Discharge status: Home / Self Care | Payer: Medicaid Other | Source: Ambulatory Visit | Attending: Diagnostic Radiology | Admitting: Diagnostic Radiology

## 2020-06-20 ENCOUNTER — Other Ambulatory Visit (HOSPITAL_BASED_OUTPATIENT_CLINIC_OR_DEPARTMENT_OTHER): Payer: Self-pay | Admitting: Emergency Medicine

## 2020-06-20 DIAGNOSIS — Z1231 Encounter for screening mammogram for malignant neoplasm of breast: Secondary | ICD-10-CM | POA: Diagnosis not present

## 2020-06-23 ENCOUNTER — Other Ambulatory Visit: Payer: Self-pay

## 2020-06-23 ENCOUNTER — Encounter (HOSPITAL_BASED_OUTPATIENT_CLINIC_OR_DEPARTMENT_OTHER): Payer: Self-pay | Admitting: Emergency Medicine

## 2020-06-23 ENCOUNTER — Emergency Department (HOSPITAL_BASED_OUTPATIENT_CLINIC_OR_DEPARTMENT_OTHER): Payer: Medicaid Other

## 2020-06-23 ENCOUNTER — Emergency Department (HOSPITAL_BASED_OUTPATIENT_CLINIC_OR_DEPARTMENT_OTHER)
Admission: EM | Admit: 2020-06-23 | Discharge: 2020-06-23 | Disposition: A | Discharge status: Other Acute Inpatient Hospital | Payer: Medicaid Other | Source: Home / Self Care | Attending: Emergency Medicine | Admitting: Emergency Medicine

## 2020-06-23 ENCOUNTER — Emergency Department (HOSPITAL_BASED_OUTPATIENT_CLINIC_OR_DEPARTMENT_OTHER)
Admission: EM | Admit: 2020-06-23 | Discharge: 2020-06-23 | Discharge status: Against Medical Advice | Payer: Medicaid Other | Attending: Emergency Medicine | Admitting: Emergency Medicine

## 2020-06-23 DIAGNOSIS — Z049 Encounter for examination and observation for unspecified reason: Secondary | ICD-10-CM | POA: Diagnosis not present

## 2020-06-23 DIAGNOSIS — R1031 Right lower quadrant pain: Secondary | ICD-10-CM | POA: Diagnosis not present

## 2020-06-23 DIAGNOSIS — K388 Other specified diseases of appendix: Secondary | ICD-10-CM

## 2020-06-23 DIAGNOSIS — U071 COVID-19: Secondary | ICD-10-CM | POA: Diagnosis not present

## 2020-06-23 DIAGNOSIS — R109 Unspecified abdominal pain: Secondary | ICD-10-CM | POA: Diagnosis present

## 2020-06-23 DIAGNOSIS — K3580 Unspecified acute appendicitis: Secondary | ICD-10-CM | POA: Diagnosis not present

## 2020-06-23 DIAGNOSIS — K381 Appendicular concretions: Secondary | ICD-10-CM | POA: Diagnosis not present

## 2020-06-23 DIAGNOSIS — D259 Leiomyoma of uterus, unspecified: Secondary | ICD-10-CM | POA: Diagnosis not present

## 2020-06-23 DIAGNOSIS — C181 Malignant neoplasm of appendix: Secondary | ICD-10-CM | POA: Diagnosis not present

## 2020-06-23 DIAGNOSIS — G8929 Other chronic pain: Secondary | ICD-10-CM | POA: Diagnosis not present

## 2020-06-23 LAB — URINALYSIS, ROUTINE W REFLEX MICROSCOPIC
Bilirubin Urine: NEGATIVE
Glucose, UA: NEGATIVE mg/dL
Hgb urine dipstick: NEGATIVE
Ketones, ur: NEGATIVE mg/dL
Leukocytes,Ua: NEGATIVE
Nitrite: NEGATIVE
Protein, ur: NEGATIVE mg/dL
Specific Gravity, Urine: 1.03 (ref 1.005–1.030)
pH: 5 (ref 5.0–8.0)

## 2020-06-23 LAB — CBC WITH DIFFERENTIAL/PLATELET
Abs Immature Granulocytes: 0.01 10*3/uL (ref 0.00–0.07)
Basophils Absolute: 0 10*3/uL (ref 0.0–0.1)
Basophils Relative: 1 %
Eosinophils Absolute: 0.1 10*3/uL (ref 0.0–0.5)
Eosinophils Relative: 2 %
HCT: 38.6 % (ref 36.0–46.0)
Hemoglobin: 12.6 g/dL (ref 12.0–15.0)
Immature Granulocytes: 0 %
Lymphocytes Relative: 32 %
Lymphs Abs: 1.2 10*3/uL (ref 0.7–4.0)
MCH: 28.1 pg (ref 26.0–34.0)
MCHC: 32.6 g/dL (ref 30.0–36.0)
MCV: 86 fL (ref 80.0–100.0)
Monocytes Absolute: 0.5 10*3/uL (ref 0.1–1.0)
Monocytes Relative: 13 %
Neutro Abs: 2 10*3/uL (ref 1.7–7.7)
Neutrophils Relative %: 52 %
Platelets: 383 10*3/uL (ref 150–400)
RBC: 4.49 MIL/uL (ref 3.87–5.11)
RDW: 14.6 % (ref 11.5–15.5)
WBC: 3.7 10*3/uL — ABNORMAL LOW (ref 4.0–10.5)
nRBC: 0 % (ref 0.0–0.2)

## 2020-06-23 LAB — COMPREHENSIVE METABOLIC PANEL
ALT: 24 U/L (ref 0–44)
AST: 21 U/L (ref 15–41)
Albumin: 3.8 g/dL (ref 3.5–5.0)
Alkaline Phosphatase: 40 U/L (ref 38–126)
Anion gap: 8 (ref 5–15)
BUN: 18 mg/dL (ref 6–20)
CO2: 26 mmol/L (ref 22–32)
Calcium: 9.3 mg/dL (ref 8.9–10.3)
Chloride: 104 mmol/L (ref 98–111)
Creatinine, Ser: 0.89 mg/dL (ref 0.44–1.00)
GFR, Estimated: >60 mL/min (ref >60)
Glucose, Bld: 95 mg/dL (ref 70–99)
Potassium: 4.2 mmol/L (ref 3.5–5.1)
Sodium: 138 mmol/L (ref 135–145)
Total Bilirubin: 0.6 mg/dL (ref 0.3–1.2)
Total Protein: 7.1 g/dL (ref 6.5–8.1)

## 2020-06-23 LAB — RESP PANEL BY RT-PCR (FLU A&B, COVID) ARPGX2
Influenza A by PCR: NEGATIVE
Influenza B by PCR: NEGATIVE
SARS Coronavirus 2 by RT PCR: POSITIVE — AB

## 2020-06-23 LAB — PREGNANCY, URINE: Preg Test, Ur: NEGATIVE

## 2020-06-23 MED ORDER — ONDANSETRON HCL 4 MG/2ML IJ SOLN
4.0000 mg | Freq: Once | INTRAMUSCULAR | Status: AC
Start: 2020-06-23 — End: 2020-06-23
  Administered 2020-06-23: 4 mg via INTRAVENOUS
  Filled 2020-06-23: qty 2

## 2020-06-23 MED ORDER — IOHEXOL 300 MG/ML  SOLN
100.0000 mL | Freq: Once | INTRAMUSCULAR | Status: AC | PRN
  Administered 2020-06-23: 100 mL via INTRAVENOUS

## 2020-06-23 NOTE — ED Provider Notes (Signed)
[facility] Provider Note   CSN: [id number] Arrival date & time: 06/23/20  6433     History Chief Complaint  Patient presents with  . Flank Pain    Melissa [name] is a 45 y.o. 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  States that for the past few weeks she has had worsening, intermittent right lower quadrant pain and is concerned that this could be something to do with the appendix.  She was diagnosed with this mass in December when she began having nausea and vomiting.  She had been having intermittent pain prior to that as well.  She has seen general surgery outpatient and is in the works was trying to find a date to schedule surgery in the meantime is also following up with GI for scoping.  She has been taking NSAIDs with some improvement in her pain but is concerned that the pain is worsened.  Denies any changes to bowel movements or urination.  No vaginal or pelvic complaints.  She has been having nausea that has been persistent for the past several months which she is aware of being worked up for by GI.  Denies any bloody stools, chest pain, shortness of breath, fever.  HPI     Past Medical History:  Diagnosis Date  . Abnormal Pap smear 08/30/2008   LGSIL  . Allergy   . BV (bacterial vaginosis)    recurring  . Chlamydia   . Chronic pain of left knee   . Ectopic pregnancy   . Fibroid   . Infertility    Secondary PCOS  . PCOS (polycystic ovarian syndrome)   . Shingles outbreak 11/2011  . Vertigo     Patient Active Problem List   Diagnosis Date Noted  . 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 Right ankle injury 03/01/2016  . Rash and nonspecific skin eruption 08/11/2015  . Contraceptive management 10/22/2013  . Cold 10/22/2013  . Uterine fibroids, antepartum condition or complication 29/51/8841  .  Ruptured ectopic pregnancy 07/05/2011  . Obesity 12/21/2010    Past Surgical History:  Procedure Laterality Date  . DILATION AND CURETTAGE OF UTERUS  06/2011   ectopic  . EAR TUBE REMOVAL    . ECTOPIC PREGNANCY SURGERY    . LAPAROSCOPY  07/05/2011   Procedure: LAPAROSCOPY OPERATIVE;  Surgeon: Osborne Oman, MD;  Location: [facility];  Service: Gynecology;  Laterality: N/A;  . right ear surgery    . WISDOM TOOTH EXTRACTION       OB History    Gravida  5   Para  3   Term  3   Preterm  0   AB  2   Living  3     SAB  1   IAB  0   Ectopic  1   Multiple  0   Live Births  3           Family History  Problem Relation Age of Onset  . Diabetes Mother   . Hypertension Mother   . Cancer Father   . Fibroids Sister   . Hypertension Sister   . Crohn's disease Brother   . Cancer Brother   . Diabetes Brother   . Hypertension Brother   . Kidney disease Brother   . Breast cancer Maternal Aunt   . Breast cancer Paternal Aunt   .  Anesthesia problems Neg Hx     Social History   Tobacco Use  . Smoking status: Never Smoker  . Smokeless tobacco: Never Used  Vaping Use  . Vaping Use: Never used  Substance Use Topics  . Alcohol use: No  . Drug use: No    Home Medications Prior to Admission medications   Medication Sig Start Date End Date Taking? Authorizing Provider  dicyclomine (BENTYL) 20 MG tablet Take 1 tablet (20 mg total) by mouth 2 (two) times daily. 05/12/20   Joy, Shawn C, PA-C  ferrous sulfate 325 (65 FE) MG tablet Take 325 mg by mouth daily with breakfast.    [provider]  ibuprofen (ADVIL) 600 MG tablet Take 1 tablet (600 mg total) by mouth every 6 (six) hours as needed for up to 30 doses for mild pain or moderate pain. 03/27/20   Wyvonnia Dusky, MD  methylPREDNISolone (MEDROL DOSEPAK) 4 MG TBPK tablet Use as directed on package 03/27/20   Wyvonnia Dusky, MD  ondansetron (ZOFRAN ODT) 4 MG disintegrating tablet Take 1 tablet (4 mg  total) by mouth every 8 (eight) hours as needed for nausea or vomiting. 05/12/20   Joy, Shawn C, PA-C  diphenhydrAMINE (BENADRYL) 12.5 MG/5ML liquid Take 12.5 mg by mouth once.  08/01/11  [provider]    Allergies    Philippa Sicks scolymus (artichoke)]  Review of Systems   Review of Systems  Constitutional: Negative for appetite change, chills and fever.  HENT: Negative for ear pain, rhinorrhea, sneezing and sore throat.   Eyes: Negative for photophobia and visual disturbance.  Respiratory: Negative for cough, chest tightness, shortness of breath and wheezing.   Cardiovascular: Negative for chest pain and palpitations.  Gastrointestinal: Positive for abdominal pain. Negative for blood in stool, constipation, diarrhea, nausea and vomiting.  Genitourinary: Negative for dysuria, hematuria and urgency.  Musculoskeletal: Negative for myalgias.  Skin: Negative for rash.  Neurological: Negative for dizziness, weakness and light-headedness.    Physical Exam Updated Vital Signs BP (!) 138/116   Pulse 96   Temp 98.2 F (36.8 C)   Resp 16   Ht 5\' 4"  (1.626 m)   Wt 108.9 kg   SpO2 100%   BMI 41.20 kg/m   Physical Exam Vitals and nursing note reviewed.  Constitutional:      General: She is not in acute distress.    Appearance: She is well-developed and well-nourished.  HENT:     Head: Normocephalic and atraumatic.     Nose: Nose normal.  Eyes:     General: No scleral icterus.       Left eye: No discharge.     Extraocular Movements: EOM normal.     Conjunctiva/sclera: Conjunctivae normal.  Cardiovascular:     Rate and Rhythm: Normal rate and regular rhythm.     Pulses: Intact distal pulses.     Heart sounds: Normal heart sounds. No murmur heard. No friction rub. No gallop.   Pulmonary:     Effort: Pulmonary effort is normal. No respiratory distress.     Breath sounds: Normal breath sounds.  Abdominal:     General: Bowel sounds are normal. There is no  distension.     Palpations: Abdomen is soft.     Tenderness: There is abdominal tenderness (RLQ). There is no guarding.  Musculoskeletal:        General: No edema. Normal range of motion.     Cervical back: Normal range of motion and neck supple.  Skin:    General: Skin is warm and dry.     Findings: No rash.  Neurological:     Mental Status: She is alert.     Motor: No abnormal muscle tone.     Coordination: Coordination normal.  Psychiatric:        Mood and Affect: Mood and affect normal.     ED Results / Procedures / Treatments   Labs (all labs ordered are listed, but only abnormal results are displayed) Labs Reviewed  CBC WITH DIFFERENTIAL/PLATELET - Abnormal; Notable for the following components:      Result Value   WBC 3.7 (*)    All other components within normal limits  RESP PANEL BY RT-PCR (FLU A&B, COVID) ARPGX2  URINALYSIS, ROUTINE W REFLEX MICROSCOPIC  PREGNANCY, URINE  COMPREHENSIVE METABOLIC PANEL    EKG None  Radiology CT ABDOMEN PELVIS W CONTRAST  Addendum Date: 06/23/2020   ADDENDUM REPORT: 06/23/2020 13:12 ADDENDUM: These results were called by telephone at the time of interpretation on 06/23/2020 at 1:12 pm to provider [facility] , who verbally acknowledged these results. Electronically Signed   By: Zetta Bills M.D.   On: 06/23/2020 13:12   Result Date: 06/23/2020 CLINICAL DATA:  Right lower quad abdominal pain suspected appendicitis in this 45 year old female previously shown to have an appendiceal abnormality in December of 2021 EXAM: CT ABDOMEN AND PELVIS WITH CONTRAST TECHNIQUE: Multidetector CT imaging of the abdomen and pelvis was performed using the standard protocol following bolus administration of intravenous contrast. CONTRAST:  177mL OMNIPAQUE IOHEXOL 300 MG/ML  SOLN COMPARISON:  May 12, 2020 FINDINGS: Lower chest: Tiny 3 mm nodule in the RIGHT lung base on image 2 of series 4. Also ground-glass nodule on image 4 of series 4 along the  major fissure in the RIGHT middle lobe and similarly a small ground-glass nodule in the RIGHT lung base both between 5 and 6 mm. No effusion. No consolidation. Hepatobiliary: No focal, suspicious hepatic lesion. No pericholecystic stranding. No biliary duct dilation. Portal vein is patent. Pancreas: Normal, without mass, inflammation or ductal dilatation. Spleen: Normal Adrenals/Urinary Tract: Adrenal glands are normal. Symmetric renal enhancement. No hydronephrosis. Urinary bladder with smooth contour. Stomach/Bowel: Stomach and small bowel with normal caliber. Redemonstration of dilated appendix associated with calcification. Mild periappendiceal stranding and a trace amount of fluid is seen on today's study. This is seen more towards the base of the appendix. Appendiceal diameter 2.9 cm which is similar to the prior exam. Note that the amount of stranding is small and adjacent small bowel mesentery does not show inflammatory changes. No signs of secondary enteritis at this time. Stomach under distended limiting assessment. Stool fills the proximal colon only mildly distended. Vascular/Lymphatic: No aneurysmal dilation of the abdominal aorta. Patent abdominal vasculature. No mesenteric adenopathy. No retroperitoneal or upper abdominal adenopathy. No pelvic lymphadenopathy. Reproductive: Large leiomyomas in the uterus with similar appearance to prior imaging no gross adnexal mass. Other: No free air. Trace ascites in the pelvis. No overt peritoneal nodularity. Musculoskeletal: Spinal degenerative changes. No acute or destructive bone process. IMPRESSION: 1. Redemonstration of dilated appendix associated with calcification. Mild periappendiceal stranding and a trace amount of fluid is seen on today's study. This may represent early acute appendicitis with associated with mucinous neoplasm of the appendix or early peritoneal involvement from mucinous tumor. Surgical consultation is suggested. 2. Trace ascites in the  pelvis. No overt peritoneal nodularity. Ascites nonspecific given patient age. 3. Large leiomyomas in the uterus with similar appearance  to prior imaging. 4. Small basilar nodules, of uncertain significance. If above findings show overt neoplasm at pathology [facility] guidelines would not apply. Otherwise the following recommendations are suggested. Non-contrast chest CT at 3-6 months is recommended. If nodules persist, subsequent management will be based upon the most suspicious nodule(s). This recommendation follows the consensus statement: Guidelines for Management of Incidental Pulmonary Nodules Detected on CT Images: From the Fleischner Society 2017; Radiology 2017; 284:228-243. 5. Aortic atherosclerosis. Call is out to the referring provider to further discuss findings in the above case. Aortic Atherosclerosis (ICD10-I70.0). Electronically Signed: By: Zetta Bills M.D. On: 06/23/2020 13:06    Procedures Procedures   Medications Ordered in ED Medications  ondansetron (ZOFRAN) injection 4 mg (4 mg Intravenous Given 06/23/20 1212)  iohexol (OMNIPAQUE) 300 MG/ML solution 100 mL (100 mLs Intravenous Contrast Given 06/23/20 1223)    ED Course  I have reviewed the triage vital signs and the nursing notes.  Pertinent labs & imaging results that were available during my care of the patient were reviewed by me and considered in my medical decision making (see chart for details).    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  At the end of December last year was diagnosed with mass in her appendix.  She has seen surgery outpatient and is being worked up for further cancer in her body prior to scheduling surgery.  She has been taking NSAIDs with minimal improvement.  For the past few weeks her pain has worsened.  Denies any  vomiting, changes to bowel movements or urination.  No chest pain or shortness of breath.  On exam there is tenderness of the right lower quadrant without rebound or guarding.  Reviewed CT scan from December which showed mass on appendix concerning for neoplasm.  Lab work here including CBC, CMP unremarkable.  Pregnancy test is negative.  Urinalysis without any abnormalities.  I was called by radiologist regarding patient's CT scan.  It appears that she has stranding and fluid around this persistent mass in the appendix.  This is new from prior.  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  I spoke to Dr. Dema Severin, on-call general surgeon over the phone.  He is concerned that this could be either overlying appendicitis or rupture of this possible mucinous neoplasm.  She will need HIPEC treatment during the time of surgery which is done at [facility].  He recommends transfer emergently to be evaluated by their surgical oncologist.  This is unfortunately not a procedure that is done at our [facility]. I am informed patient of the results of her CT findings as well as my discussion with the radiologist and surgeon.  Discussed to her that she needs emergent transfer to [facility] for treatment of this neoplasm.  Patient states that she has 2 small children at home that she cannot leave without arranging for childcare.  She states that they need to be picked up from school at the time of our discussion.  I encouraged her to call any family members or partners to pick up her kids but she states that she is unable to do so.  She promises that she will return later on in the  evening when she has childcare figured out.  I informed her that this could progress quickly and can be life-threatening if not evaluated urgently.  She understands but states that she cannot leave her children by themselves and she has no other options.  Unfortunately patient discharged AMA prior to me calling surgery again  if there is any treatment we can give her prior to her leaving.  I have discussed my concerns as a provider and the possibility that this may worsen. We discussed the nature, risks and benefits, and alternatives to treatment. I have specifically discussed that without further evaluation I cannot guarantee there is not a life threatening event occuring.  Time was given to allow the opportunity to ask questions and consider the options, and after the discussion, the patient decided to refuse the offered treatment. Patient is alert and oriented x4, their own POA and states understanding of my concerns and the possible consequences. After refusal, I made every reasonable opportunity to treat them to the best of my ability. I have made the patient aware that this is an [place] discharge, but he may return at any time for further evaluation and treatment.  Final Clinical Impression(s) / ED Diagnoses Final diagnoses:  Mass of appendix    Rx / DC Orders ED Discharge Orders    None       Delia Heady, PA-C 06/23/20 1400    Fredia Sorrow, MD 06/24/20 [phone]

## 2020-06-23 NOTE — ED Provider Notes (Addendum)
[facility] EMERGENCY DEPARTMENT Provider Note   CSN: [id number] Arrival date & time: 06/23/20  1627     History Chief Complaint  Patient presents with  . Abdominal Pain    Melissa [name] is a 45 y.o. 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  Radiology concerned about the new stranding and fluid around this area.  I spoke to our on-call general surgeon, Dr. Dema Severin who recommends transfer to [facility] to be evaluated by surgical oncologist as she will most likely require HIPEC treatment during surgery.  At the time patient was concerned about childcare so she went home.  She returns today to seek treatment.  HPI     Past Medical History:  Diagnosis Date  . Abnormal Pap smear 08/30/2008   LGSIL  . Allergy   . BV (bacterial vaginosis)    recurring  . Chlamydia   . Chronic pain of left knee   . Ectopic pregnancy   . Fibroid   . Infertility    Secondary PCOS  . PCOS (polycystic ovarian syndrome)   . Shingles outbreak 11/2011  . Vertigo     Patient Active Problem List   Diagnosis Date Noted  . 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 Right ankle injury 03/01/2016  . Rash and nonspecific skin eruption 08/11/2015  . Contraceptive management 10/22/2013  . Cold 10/22/2013  . Uterine fibroids, antepartum condition or complication 31/51/7616  . Ruptured ectopic pregnancy 07/05/2011  . Obesity 12/21/2010    Past Surgical History:  Procedure Laterality Date  . DILATION AND CURETTAGE OF UTERUS  06/2011   ectopic  . EAR TUBE REMOVAL    . ECTOPIC PREGNANCY SURGERY    . LAPAROSCOPY  07/05/2011   Procedure: LAPAROSCOPY OPERATIVE;  Surgeon: Osborne Oman, MD;  Location: [facility];  Service: Gynecology;  Laterality: N/A;   . right ear surgery    . WISDOM TOOTH EXTRACTION       OB History    Gravida  5   Para  3   Term  3   Preterm  0   AB  2   Living  3     SAB  1   IAB  0   Ectopic  1   Multiple  0   Live Births  3           Family History  Problem Relation Age of Onset  . Diabetes Mother   . Hypertension Mother   . Cancer Father   . Fibroids Sister   . Hypertension Sister   . Crohn's disease Brother   . Cancer Brother   . Diabetes Brother   . Hypertension Brother   . Kidney disease Brother   . Breast cancer Maternal Aunt   . Breast cancer Paternal Aunt   . Anesthesia problems Neg Hx     Social History   Tobacco Use  . Smoking status: Never Smoker  . Smokeless tobacco: Never Used  Vaping Use  . Vaping Use: Never used  Substance Use Topics  . Alcohol use: No  . Drug use: No    Home Medications Prior to Admission medications   Medication Sig Start Date End Date Taking? Authorizing Provider  dicyclomine (BENTYL) 20 MG tablet Take  1 tablet (20 mg total) by mouth 2 (two) times daily. 05/12/20   Joy, Shawn C, PA-C  ferrous sulfate 325 (65 FE) MG tablet Take 325 mg by mouth daily with breakfast.    [provider]  ibuprofen (ADVIL) 600 MG tablet Take 1 tablet (600 mg total) by mouth every 6 (six) hours as needed for up to 30 doses for mild pain or moderate pain. 03/27/20   Wyvonnia Dusky, MD  methylPREDNISolone (MEDROL DOSEPAK) 4 MG TBPK tablet Use as directed on package 03/27/20   Wyvonnia Dusky, MD  ondansetron (ZOFRAN ODT) 4 MG disintegrating tablet Take 1 tablet (4 mg total) by mouth every 8 (eight) hours as needed for nausea or vomiting. 05/12/20   Joy, Shawn C, PA-C  diphenhydrAMINE (BENADRYL) 12.5 MG/5ML liquid Take 12.5 mg by mouth once.  08/01/11  [provider]    Allergies    Philippa Sicks scolymus (artichoke)]  Review of Systems   Review of Systems  Constitutional: Negative for appetite change, chills and fever.  HENT:  Negative for ear pain, rhinorrhea, sneezing and sore throat.   Eyes: Negative for photophobia and visual disturbance.  Respiratory: Negative for cough, chest tightness, shortness of breath and wheezing.   Cardiovascular: Negative for chest pain and palpitations.  Gastrointestinal: Positive for abdominal pain. Negative for blood in stool, constipation, diarrhea, nausea and vomiting.  Genitourinary: Negative for dysuria, hematuria and urgency.  Musculoskeletal: Negative for myalgias.  Skin: Negative for rash.  Neurological: Negative for dizziness, weakness and light-headedness.    Physical Exam Updated Vital Signs BP 131/68 (BP Location: Left Arm)   Pulse (!) 106   Temp 99.2 F (37.3 C) (Oral)   Resp 18   Ht 5\' 4"  (1.626 m)   Wt 108.9 kg   SpO2 100%   BMI 41.20 kg/m   Physical Exam Vitals and nursing note reviewed.  Constitutional:      General: She is not in acute distress.    Appearance: She is well-developed and well-nourished.  HENT:     Head: Normocephalic and atraumatic.     Nose: Nose normal.  Eyes:     General: No scleral icterus.       Left eye: No discharge.     Extraocular Movements: EOM normal.     Conjunctiva/sclera: Conjunctivae normal.  Cardiovascular:     Rate and Rhythm: Normal rate and regular rhythm.     Pulses: Intact distal pulses.     Heart sounds: Normal heart sounds. No murmur heard. No friction rub. No gallop.   Pulmonary:     Effort: Pulmonary effort is normal. No respiratory distress.     Breath sounds: Normal breath sounds.  Abdominal:     General: Bowel sounds are normal. There is no distension.     Palpations: Abdomen is soft.     Tenderness: There is abdominal tenderness in the right lower quadrant. There is no guarding.  Musculoskeletal:        General: No edema. Normal range of motion.     Cervical back: Normal range of motion and neck supple.  Skin:    General: Skin is warm and dry.     Findings: No rash.  Neurological:      Mental Status: She is alert.     Motor: No abnormal muscle tone.     Coordination: Coordination normal.  Psychiatric:        Mood and Affect: Mood and affect normal.     ED Results /  Procedures / Treatments   Labs (all labs ordered are listed, but only abnormal results are displayed) Labs Reviewed - No data to display  EKG None  Radiology CT ABDOMEN PELVIS W CONTRAST  Addendum Date: 06/23/2020   ADDENDUM REPORT: 06/23/2020 13:12 ADDENDUM: These results were called by telephone at the time of interpretation on 06/23/2020 at 1:12 pm to provider [facility] , who verbally acknowledged these results. Electronically Signed   By: Zetta Bills M.D.   On: 06/23/2020 13:12   Result Date: 06/23/2020 CLINICAL DATA:  Right lower quad abdominal pain suspected appendicitis in this 45 year old female previously shown to have an appendiceal abnormality in December of 2021 EXAM: CT ABDOMEN AND PELVIS WITH CONTRAST TECHNIQUE: Multidetector CT imaging of the abdomen and pelvis was performed using the standard protocol following bolus administration of intravenous contrast. CONTRAST:  147mL OMNIPAQUE IOHEXOL 300 MG/ML  SOLN COMPARISON:  May 12, 2020 FINDINGS: Lower chest: Tiny 3 mm nodule in the RIGHT lung base on image 2 of series 4. Also ground-glass nodule on image 4 of series 4 along the major fissure in the RIGHT middle lobe and similarly a small ground-glass nodule in the RIGHT lung base both between 5 and 6 mm. No effusion. No consolidation. Hepatobiliary: No focal, suspicious hepatic lesion. No pericholecystic stranding. No biliary duct dilation. Portal vein is patent. Pancreas: Normal, without mass, inflammation or ductal dilatation. Spleen: Normal Adrenals/Urinary Tract: Adrenal glands are normal. Symmetric renal enhancement. No hydronephrosis. Urinary bladder with smooth contour. Stomach/Bowel: Stomach and small bowel with normal caliber. Redemonstration of dilated appendix associated with  calcification. Mild periappendiceal stranding and a trace amount of fluid is seen on today's study. This is seen more towards the base of the appendix. Appendiceal diameter 2.9 cm which is similar to the prior exam. Note that the amount of stranding is small and adjacent small bowel mesentery does not show inflammatory changes. No signs of secondary enteritis at this time. Stomach under distended limiting assessment. Stool fills the proximal colon only mildly distended. Vascular/Lymphatic: No aneurysmal dilation of the abdominal aorta. Patent abdominal vasculature. No mesenteric adenopathy. No retroperitoneal or upper abdominal adenopathy. No pelvic lymphadenopathy. Reproductive: Large leiomyomas in the uterus with similar appearance to prior imaging no gross adnexal mass. Other: No free air. Trace ascites in the pelvis. No overt peritoneal nodularity. Musculoskeletal: Spinal degenerative changes. No acute or destructive bone process. IMPRESSION: 1. Redemonstration of dilated appendix associated with calcification. Mild periappendiceal stranding and a trace amount of fluid is seen on today's study. This may represent early acute appendicitis with associated with mucinous neoplasm of the appendix or early peritoneal involvement from mucinous tumor. Surgical consultation is suggested. 2. Trace ascites in the pelvis. No overt peritoneal nodularity. Ascites nonspecific given patient age. 3. Large leiomyomas in the uterus with similar appearance to prior imaging. 4. Small basilar nodules, of uncertain significance. If above findings show overt neoplasm at pathology [facility] guidelines would not apply. Otherwise the following recommendations are suggested. Non-contrast chest CT at 3-6 months is recommended. If nodules persist, subsequent management will be based upon the most suspicious nodule(s). This recommendation follows the consensus statement: Guidelines for Management of Incidental Pulmonary Nodules  Detected on CT Images: From the Fleischner Society 2017; Radiology 2017; [id number]. 5. Aortic atherosclerosis. Call is out to the referring provider to further discuss findings in the above case. Aortic Atherosclerosis (ICD10-I70.0). Electronically Signed: By: Zetta Bills M.D. On: 06/23/2020 13:06    Procedures Procedures   Medications Ordered in ED  Medications - No data to display  ED Course  I have reviewed the triage vital signs and the nursing notes.  Pertinent labs & imaging results that were available during my care of the patient were reviewed by me and considered in my medical decision making (see chart for details).    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  I evaluated her today, please see my note from earlier today for full HPI and work-up.  States that she feels about the same.  I called over to [facility] surgical oncology and spoke to Dr. Jyl Heinz and reviewed CT findings with her.  Although she is not a patient of [facility] we have no surgical oncology team at [facility].  He recommends ED to ED transfer to be evaluated by their team. Patient transferred to [facility] ED with Dr. Crisoforo Oxford as the accepting provider. Patient updated on this plan.  She remains hemodynamically stable.  Of note, her Covid test earlier today was positive.  She has a known prior positive Covid test.  She began experiencing symptoms on January 17 and had a positive test on January 20.  Her children also had Covid as well.  She does report a negative test earlier this month but she remains positive on today's testing.  Portions of this note were generated with [employer]. Dictation errors may occur despite best attempts at proofreading.  Final Clinical Impression(s) / ED Diagnoses Final diagnoses:  Mass of appendix     Rx / DC Orders ED Discharge Orders    None         Delia Heady, PA-C 06/23/20 2017    Sherwood Gambler, MD 06/23/20 2211

## 2020-06-23 NOTE — ED Triage Notes (Signed)
Reports right flank pain on and off since December.  Reports she was seen here and told she had a tumor in her appendix.  Followed up with surgeon but he wanted her to see GI before doing anything.

## 2020-06-23 NOTE — ED Notes (Signed)
Pt informed by PA that she needs emergent surgery at [facility] states she needs to get her kids settled prior to being admitted for surgery. Understands risks of leaving, signing out AMA, states will return today.

## 2020-06-23 NOTE — ED Triage Notes (Signed)
Seen earlier today.  Had to leave to get kids from school.  Reports right flank pain on and off since December.  Was seen here and told she had a tumor in her appendix.

## 2020-06-24 ENCOUNTER — Telehealth: Payer: Self-pay

## 2020-06-24 ENCOUNTER — Ambulatory Visit: Payer: Medicaid Other | Admitting: Family Medicine

## 2020-06-24 NOTE — Telephone Encounter (Signed)
Called to discuss with patient about COVID-19 symptoms and the use of one of the available treatments for those with mild to moderate Covid symptoms and at a high risk of hospitalization.  Pt appears to qualify for outpatient treatment due to co-morbid conditions and/or a member of an at-risk group in accordance with the FDA Emergency Use Authorization.    Symptom onset: No symptoms Vaccinated: No Booster? No Immunocompromised? No Qualifiers: Obesity  Pt. Had positive COVID19 in January. No symptoms.  Marcello Moores

## 2020-06-28 DIAGNOSIS — D373 Neoplasm of uncertain behavior of appendix: Secondary | ICD-10-CM | POA: Diagnosis not present

## 2020-07-04 DIAGNOSIS — Z1152 Encounter for screening for COVID-19: Secondary | ICD-10-CM | POA: Diagnosis not present

## 2020-07-11 DIAGNOSIS — D373 Neoplasm of uncertain behavior of appendix: Secondary | ICD-10-CM | POA: Diagnosis not present

## 2020-07-11 DIAGNOSIS — Z20822 Contact with and (suspected) exposure to covid-19: Secondary | ICD-10-CM | POA: Diagnosis not present

## 2020-07-11 DIAGNOSIS — Z01818 Encounter for other preprocedural examination: Secondary | ICD-10-CM | POA: Diagnosis not present

## 2020-07-12 DIAGNOSIS — Z800 Family history of malignant neoplasm of digestive organs: Secondary | ICD-10-CM | POA: Diagnosis not present

## 2020-07-12 DIAGNOSIS — D373 Neoplasm of uncertain behavior of appendix: Secondary | ICD-10-CM | POA: Diagnosis not present

## 2020-07-12 DIAGNOSIS — K644 Residual hemorrhoidal skin tags: Secondary | ICD-10-CM | POA: Diagnosis not present

## 2020-07-12 DIAGNOSIS — Z1211 Encounter for screening for malignant neoplasm of colon: Secondary | ICD-10-CM | POA: Diagnosis not present

## 2020-07-13 DIAGNOSIS — C181 Malignant neoplasm of appendix: Secondary | ICD-10-CM | POA: Diagnosis not present

## 2020-07-13 DIAGNOSIS — D373 Neoplasm of uncertain behavior of appendix: Secondary | ICD-10-CM | POA: Diagnosis not present

## 2020-07-13 DIAGNOSIS — K37 Unspecified appendicitis: Secondary | ICD-10-CM | POA: Diagnosis not present

## 2020-07-19 DIAGNOSIS — D373 Neoplasm of uncertain behavior of appendix: Secondary | ICD-10-CM | POA: Diagnosis not present

## 2020-07-19 DIAGNOSIS — K644 Residual hemorrhoidal skin tags: Secondary | ICD-10-CM | POA: Insufficient documentation

## 2020-07-19 DIAGNOSIS — K5903 Drug induced constipation: Secondary | ICD-10-CM | POA: Diagnosis not present

## 2020-07-19 DIAGNOSIS — K625 Hemorrhage of anus and rectum: Secondary | ICD-10-CM | POA: Diagnosis not present

## 2020-07-19 DIAGNOSIS — T402X5A Adverse effect of other opioids, initial encounter: Secondary | ICD-10-CM | POA: Diagnosis not present

## 2020-08-03 ENCOUNTER — Ambulatory Visit: Payer: Medicaid Other | Admitting: Family Medicine

## 2020-08-06 ENCOUNTER — Encounter (HOSPITAL_BASED_OUTPATIENT_CLINIC_OR_DEPARTMENT_OTHER): Payer: Self-pay | Admitting: Emergency Medicine

## 2020-08-06 ENCOUNTER — Emergency Department (HOSPITAL_BASED_OUTPATIENT_CLINIC_OR_DEPARTMENT_OTHER)
Admission: EM | Admit: 2020-08-06 | Discharge: 2020-08-06 | Disposition: A | Discharge status: Home / Self Care | Payer: Medicaid Other | Attending: Emergency Medicine | Admitting: Emergency Medicine

## 2020-08-06 ENCOUNTER — Other Ambulatory Visit: Payer: Self-pay

## 2020-08-06 DIAGNOSIS — B373 Candidiasis of vulva and vagina: Secondary | ICD-10-CM | POA: Diagnosis not present

## 2020-08-06 DIAGNOSIS — B3731 Acute candidiasis of vulva and vagina: Secondary | ICD-10-CM

## 2020-08-06 DIAGNOSIS — R102 Pelvic and perineal pain: Secondary | ICD-10-CM | POA: Diagnosis not present

## 2020-08-06 LAB — URINALYSIS, ROUTINE W REFLEX MICROSCOPIC
Bilirubin Urine: NEGATIVE
Glucose, UA: NEGATIVE mg/dL
Hgb urine dipstick: NEGATIVE
Ketones, ur: NEGATIVE mg/dL
Nitrite: NEGATIVE
Protein, ur: NEGATIVE mg/dL
Specific Gravity, Urine: >1.03 — ABNORMAL HIGH (ref 1.005–1.030)
pH: 6 (ref 5.0–8.0)

## 2020-08-06 LAB — URINALYSIS, MICROSCOPIC (REFLEX)

## 2020-08-06 LAB — WET PREP, GENITAL
Clue Cells Wet Prep HPF POC: NONE SEEN
Sperm: NONE SEEN
Trich, Wet Prep: NONE SEEN

## 2020-08-06 LAB — PREGNANCY, URINE: Preg Test, Ur: NEGATIVE

## 2020-08-06 MED ORDER — FLUCONAZOLE 150 MG PO TABS: 150.0000 mg | ORAL_TABLET | Freq: Once | ORAL | 0 refills | Status: AC

## 2020-08-06 MED ORDER — FLUCONAZOLE 150 MG PO TABS
150.0000 mg | ORAL_TABLET | Freq: Once | ORAL | Status: AC
  Administered 2020-08-06: 150 mg via ORAL
  Filled 2020-08-06: qty 1

## 2020-08-06 NOTE — ED Provider Notes (Signed)
[facility] EMERGENCY DEPARTMENT Provider Note   CSN: [id number] Arrival date & time: 08/06/20  1859     History Chief Complaint  Patient presents with  . Vaginal Pain    Melissa [name] is a 45 y.o. female history of PCOS, obesity.  Patient presents today for vaginal itching and irritation onset 1 week ago.  Patient reports that at the beginning of March she had an appendectomy and was on antibiotics at that time.  She reports normally after she is on antibiotics she will develop a yeast infection.  She has been trying apple cider vinegar and other over-the-counter remedies for yeast infection without relief.  Symptoms have gradually worsened over the past week she now rates her itching and irritation as constant severe no alleviating or aggravating factors.  Patient reports she has not been sexually active in the past 8 years denies concern for STI.  Denies fever/chills, nausea/vomiting, abdominal pain, dysuria/hematuria, vaginal bleeding or any additional concerns  HPI     Past Medical History:  Diagnosis Date  . Abnormal Pap smear 08/30/2008   LGSIL  . Allergy   . BV (bacterial vaginosis)    recurring  . Chlamydia   . Chronic pain of left knee   . Ectopic pregnancy   . Fibroid   . Infertility    Secondary PCOS  . PCOS (polycystic ovarian syndrome)   . Shingles outbreak 11/2011  . Vertigo     Patient Active Problem List   Diagnosis Date Noted  . External hemorrhoids 07/19/2020  . 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 Uterine fibroids, antepartum condition or complication 39/76/7341  . Ruptured ectopic pregnancy 07/05/2011  . Obesity 12/21/2010    Past Surgical History:  Procedure Laterality Date  . APPENDECTOMY    . DILATION AND CURETTAGE OF UTERUS  06/2011   ectopic  . EAR TUBE REMOVAL    . ECTOPIC PREGNANCY SURGERY    .  LAPAROSCOPY  07/05/2011   Procedure: LAPAROSCOPY OPERATIVE;  Surgeon: Osborne Oman, MD;  Location: [facility];  Service: Gynecology;  Laterality: N/A;  . right ear surgery    . WISDOM TOOTH EXTRACTION       OB History    Gravida  5   Para  3   Term  3   Preterm  0   AB  2   Living  3     SAB  1   IAB  0   Ectopic  1   Multiple  0   Live Births  3           Family History  Problem Relation Age of Onset  . Diabetes Mother   . Hypertension Mother   . Cancer Father   . Fibroids Sister   . Hypertension Sister   . Crohn's disease Brother   . Cancer Brother   . Diabetes Brother   . Hypertension Brother   . Kidney disease Brother   . Breast cancer Maternal Aunt   . Breast cancer Paternal Aunt   . Anesthesia problems Neg Hx     Social History   Tobacco Use  . Smoking status: Never Smoker  . Smokeless tobacco: Never Used  Vaping Use  . Vaping Use: Never used  Substance Use Topics  . Alcohol use: No  . Drug use: No    Home Medications Prior to Admission medications  Medication Sig Start Date End Date Taking? Authorizing Provider  fluconazole (DIFLUCAN) 150 MG tablet Take 1 tablet (150 mg total) by mouth once for 1 dose. 08/09/20 08/09/20 Yes Nuala Alpha A, PA-C  dicyclomine (BENTYL) 20 MG tablet Take 1 tablet (20 mg total) by mouth 2 (two) times daily. 05/12/20   Joy, Shawn C, PA-C  ferrous sulfate 325 (65 FE) MG tablet Take 325 mg by mouth daily with breakfast.    [provider]  ibuprofen (ADVIL) 600 MG tablet Take 1 tablet (600 mg total) by mouth every 6 (six) hours as needed for up to 30 doses for mild pain or moderate pain. 03/27/20   Wyvonnia Dusky, MD  methylPREDNISolone (MEDROL DOSEPAK) 4 MG TBPK tablet Use as directed on package 03/27/20   Wyvonnia Dusky, MD  ondansetron (ZOFRAN ODT) 4 MG disintegrating tablet Take 1 tablet (4 mg total) by mouth every 8 (eight) hours as needed for nausea or vomiting. 05/12/20   Joy, Shawn C,  PA-C  diphenhydrAMINE (BENADRYL) 12.5 MG/5ML liquid Take 12.5 mg by mouth once.  08/01/11  [provider]    Allergies    Philippa Sicks scolymus (artichoke)]  Review of Systems   Review of Systems  Constitutional: Negative.  Negative for chills and fever.  Gastrointestinal: Negative.  Negative for abdominal pain, nausea and vomiting.  Genitourinary: Positive for vaginal discharge (Vaginal itch). Negative for dysuria, flank pain, genital sores, hematuria, pelvic pain and vaginal bleeding.  Skin: Negative.  Negative for rash.     Physical Exam Updated Vital Signs BP 123/69   Pulse 78   Temp 97.8 F (36.6 C) (Oral)   Resp 18   Ht 5\' 4"  (1.626 m)   Wt 112 kg   LMP 06/27/2020   SpO2 100%   BMI 42.40 kg/m   Physical Exam Constitutional:      General: She is not in acute distress.    Appearance: Normal appearance. She is well-developed. She is not ill-appearing or diaphoretic.  HENT:     Head: Normocephalic and atraumatic.  Eyes:     General: Vision grossly intact. Gaze aligned appropriately.     Pupils: Pupils are equal, round, and reactive to light.  Neck:     Trachea: Trachea and phonation normal.  Cardiovascular:     Rate and Rhythm: Normal rate and regular rhythm.  Pulmonary:     Effort: Pulmonary effort is normal. No respiratory distress.  Abdominal:     General: There is no distension.     Palpations: Abdomen is soft.     Tenderness: There is no abdominal tenderness. There is no guarding or rebound.  Genitourinary:    Comments: Patient refused pelvic exam. Musculoskeletal:        General: Normal range of motion.     Cervical back: Normal range of motion.  Skin:    General: Skin is warm and dry.  Neurological:     Mental Status: She is alert.     GCS: GCS eye subscore is 4. GCS verbal subscore is 5. GCS motor subscore is 6.     Comments: Speech is clear and goal oriented, follows commands Major Cranial nerves without deficit, no facial  droop Moves extremities without ataxia, coordination intact  Psychiatric:        Behavior: Behavior normal.     ED Results / Procedures / Treatments   Labs (all labs ordered are listed, but only abnormal results are displayed) Labs Reviewed  WET PREP, GENITAL - Abnormal;  Notable for the following components:      Result Value   Yeast Wet Prep HPF POC PRESENT (*)    WBC, Wet Prep HPF POC MODERATE (*)    All other components within normal limits  URINALYSIS, ROUTINE W REFLEX MICROSCOPIC - Abnormal; Notable for the following components:   APPearance CLOUDY (*)    Specific Gravity, Urine >1.030 (*)    Leukocytes,Ua SMALL (*)    All other components within normal limits  URINALYSIS, MICROSCOPIC (REFLEX) - Abnormal; Notable for the following components:   Bacteria, UA MANY (*)    All other components within normal limits  URINE CULTURE  PREGNANCY, URINE  RPR  HIV ANTIBODY (ROUTINE TESTING W REFLEX)  GC/CHLAMYDIA PROBE AMP ([place]) NOT AT [facility]    EKG None  Radiology No results found.  Procedures Procedures   Medications Ordered in ED Medications  fluconazole (DIFLUCAN) tablet 150 mg (has no administration in time range)    ED Course  I have reviewed the triage vital signs and the nursing notes.  Pertinent labs & imaging results that were available during my care of the patient were reviewed by me and considered in my medical decision making (see chart for details).    MDM Rules/Calculators/A&P                         Additional history obtained from: 1. Nursing notes from this visit. 2. Electronic medical records. --------------- 45 year old female presented for vaginal itching and irritation onset a week ago she is concerned for yeast infection.  Over-the-counter remedies have not helped.  She denies any concern for STI.  On arrival patient had to be slightly tachycardic rate 113.  On my evaluation tachycardia has improved.  She has no abdominal pain on exam.   She refused pelvic examination, she states understanding limitations of work-up today if she self swabs.  Patient states understanding and request to self swab today. - I ordered, reviewed and interpreted labs which include: Urinalysis shows small leukocytes, bacteria, negative nitrites.  Urinalysis showed yeast.  Doubt bacterial urinary tract infection.  Urine culture pending Wet prep shows yeast and WBCs.  No trichomoniasis or clue cells. GC chlamydia and HIV/RPR pending = History and labs are consistent with vulvovaginal candidiasis.  Will treat with fluconazole 150 mg p.o. once here and then repeat in 72 hours.  Additionally patient requesting referral for OB/GYN will refer to women's center.  Given the lack of abdominal pain pain or abnormal vaginal discharge and with patient reporting abstinence for the past 8 years doubt PID or other emergent pathologies at this time  Patient aware of pending RPR, HIV and GC chlamydia tests.  Given patient has engaged in sexual activity in the past 8 years doubt STI at this time.  Patient is aware to follow-up on this test and her MyChart account, if any tests are positive she will seek treatment through her PCP, OB/GYN, health department, urgent care or here at the emergency department.  At this time there does not appear to be any evidence of an acute emergency medical condition and the patient appears stable for discharge with appropriate outpatient follow up. Diagnosis was discussed with patient who verbalizes understanding of care plan and is agreeable to discharge. I have discussed return precautions with patient who verbalizes understanding. Patient encouraged to follow-up with their PCP and OB/GYN all questions answered.  Patient's case discussed with Dr. Roslynn Amble who agrees with plan to discharge with  follow-up.   Note: Portions of this report may have been transcribed using voice recognition software. Every effort was made to ensure accuracy; however,  inadvertent computerized transcription errors may still be present. Final Clinical Impression(s) / ED Diagnoses Final diagnoses:  Vulvovaginal candidiasis    Rx / DC Orders ED Discharge Orders         Ordered    fluconazole (DIFLUCAN) 150 MG tablet   Once        08/06/20 2304           Gari Crown 08/06/20 2307    Lucrezia Starch, MD 08/07/20 1758

## 2020-08-06 NOTE — Discharge Instructions (Signed)
At this time there does not appear to be the presence of an emergent medical condition, however there is always the potential for conditions to change. Please read and follow the below instructions.  Please return to the Emergency Department immediately for any new or worsening symptoms or if your symptoms do not improve within 3 days. Please be sure to follow up with your Primary Care Provider within one week regarding your visit today; please call their office to schedule an appointment even if you are feeling better for a follow-up visit. Please call the [facility] under discharge paperwork to establish a local OB/GYN. You are given your first dose of medicine here in the ER.  Please take your second dose of fluconazole in 72 hours for treatment of yeast infection. Your gonorrhea, chlamydia, HIV and syphilis tests are currently in process.  This should resolve in the next 2-3 days on your MyChart account.  Please check your MyChart account for the results and discuss them with your primary care provider and OB/GYN at your follow-up visit.  If any of those tests are positive you will need to seek treatment at either your primary care provider, the health department, your OB/GYN's office, and urgent care or here at the emergency department immediately.  If any of those tests are positive you will need to inform all of your sexual partners that they will need testing and treatment as well.  Please abstain from sex until you are test results are available and negative.  Always use barrier protection during sexual encounters.  Go to the nearest Emergency Department immediately if: You have fever or chills Your symptoms go away and then return. Your symptoms do not get better with treatment. Your symptoms get worse. You have new symptoms. You develop blisters in or around your vagina. You have blood coming from your vagina and it is not your menstrual period. You develop pain in your  abdomen. You have any new/concerning or worsening of symptoms.   Please read the additional information packets attached to your discharge summary.  Do not take your medicine if  develop an itchy rash, swelling in your mouth or lips, or difficulty breathing; call 911 and seek immediate emergency medical attention if this occurs.  You may review your lab tests and imaging results in their entirety on your MyChart account.  Please discuss all results of fully with your primary care provider and other specialist at your follow-up visit.  Note: Portions of this text may have been transcribed using voice recognition software. Every effort was made to ensure accuracy; however, inadvertent computerized transcription errors may still be present.

## 2020-08-06 NOTE — ED Triage Notes (Signed)
Reports vaginal itching, burning, redness, swelling and irritation x 1 week. Has tried OTC meds and other home remedies without relief. Was on antibiotics after surgery on 07/13/20 for 10 days. No other symptoms.

## 2020-08-07 LAB — HIV ANTIBODY (ROUTINE TESTING W REFLEX): HIV Screen 4th Generation wRfx: NONREACTIVE

## 2020-08-07 LAB — RPR: RPR Ser Ql: NONREACTIVE

## 2020-08-08 LAB — GC/CHLAMYDIA PROBE AMP (~~LOC~~) NOT AT ARMC
Chlamydia: NEGATIVE
Comment: NEGATIVE
Comment: NORMAL
Neisseria Gonorrhea: NEGATIVE

## 2020-08-08 LAB — URINE CULTURE

## 2020-08-09 DIAGNOSIS — Z483 Aftercare following surgery for neoplasm: Secondary | ICD-10-CM | POA: Diagnosis not present

## 2020-08-09 DIAGNOSIS — D373 Neoplasm of uncertain behavior of appendix: Secondary | ICD-10-CM | POA: Diagnosis not present

## 2020-08-09 DIAGNOSIS — Z9049 Acquired absence of other specified parts of digestive tract: Secondary | ICD-10-CM | POA: Diagnosis not present

## 2020-08-18 ENCOUNTER — Emergency Department (HOSPITAL_BASED_OUTPATIENT_CLINIC_OR_DEPARTMENT_OTHER)
Admission: EM | Admit: 2020-08-18 | Discharge: 2020-08-18 | Disposition: A | Discharge status: Home / Self Care | Payer: Medicaid Other | Attending: Emergency Medicine | Admitting: Emergency Medicine

## 2020-08-18 ENCOUNTER — Encounter (HOSPITAL_BASED_OUTPATIENT_CLINIC_OR_DEPARTMENT_OTHER): Payer: Self-pay

## 2020-08-18 DIAGNOSIS — M25519 Pain in unspecified shoulder: Secondary | ICD-10-CM | POA: Insufficient documentation

## 2020-08-18 DIAGNOSIS — H6691 Otitis media, unspecified, right ear: Secondary | ICD-10-CM | POA: Insufficient documentation

## 2020-08-18 DIAGNOSIS — M546 Pain in thoracic spine: Secondary | ICD-10-CM | POA: Insufficient documentation

## 2020-08-18 DIAGNOSIS — M549 Dorsalgia, unspecified: Secondary | ICD-10-CM | POA: Diagnosis present

## 2020-08-18 MED ORDER — CYCLOBENZAPRINE HCL 10 MG PO TABS: 10.0000 mg | ORAL_TABLET | Freq: Three times a day (TID) | ORAL | 0 refills | Status: DC | PRN

## 2020-08-18 MED ORDER — AMOXICILLIN 500 MG PO CAPS: 500.0000 mg | ORAL_CAPSULE | Freq: Two times a day (BID) | ORAL | 0 refills | Status: AC

## 2020-08-18 MED ORDER — LIDOCAINE 5 % EX PTCH: 1.0000 | MEDICATED_PATCH | CUTANEOUS | 0 refills | Status: DC

## 2020-08-18 NOTE — Discharge Instructions (Signed)
If you develop worsening, recurrent, or continued back pain, numbness or weakness in the legs, incontinence of your bowels or bladders, numbness of your buttocks, fever, abdominal pain, or any other new/concerning symptoms then return to the ER for evaluation.  

## 2020-08-18 NOTE — ED Triage Notes (Signed)
Right subscapular pain ongoing, intermittent  since march 13, worse when lying down.  Currently 2/10.

## 2020-08-18 NOTE — ED Provider Notes (Signed)
[name] Provider Note   CSN: [id number] Arrival date & time: 08/18/20  0846     History Chief Complaint  Patient presents with  . Shoulder Pain  . Otalgia    [name] is a 45 y.o. female.  HPI 45 year old female presents with right-sided back pain.  This has been ongoing for just under a month.  The only thing that makes it worse is laying on her back.  Otherwise the pain is pretty much a constant pain.  This morning when she got up in the middle the night because of the kids nightmare, she felt a sudden worsening of the pain and so she decided to get checked out.  Otherwise has been trying heat and topical medicine along with ibuprofen and Tylenol and Aleve with no relief.  No fevers, weakness or numbness in her legs, or radiation of the pain to her abdomen or legs.  No incontinence.  No pleuritic pain.  She also endorses 2 days of right ear pain which is a recurrent issue and she frequently gets infections.   Past Medical History:  Diagnosis Date  . Abnormal Pap smear 08/30/2008   LGSIL  . Allergy   . BV (bacterial vaginosis)    recurring  . Chlamydia   . Chronic pain of left knee   . Ectopic pregnancy   . Fibroid   . Infertility    Secondary PCOS  . PCOS (polycystic ovarian syndrome)   . Shingles outbreak 11/2011  . Vertigo     Patient Active Problem List   Diagnosis Date Noted  . External hemorrhoids 07/19/2020  . 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 Uterine fibroids, antepartum condition or complication 60/63/0160  . Ruptured ectopic pregnancy 07/05/2011  . Obesity 12/21/2010    Past Surgical History:  Procedure Laterality Date  . APPENDECTOMY    . DILATION AND CURETTAGE OF UTERUS  06/2011   ectopic  . EAR TUBE REMOVAL    . ECTOPIC PREGNANCY SURGERY    . LAPAROSCOPY  07/05/2011   Procedure:  LAPAROSCOPY OPERATIVE;  Surgeon: Osborne Oman, MD;  Location: [facility];  Service: Gynecology;  Laterality: N/A;  . right ear surgery    . WISDOM TOOTH EXTRACTION       OB History    Gravida  5   Para  3   Term  3   Preterm  0   AB  2   Living  3     SAB  1   IAB  0   Ectopic  1   Multiple  0   Live Births  3           Family History  Problem Relation Age of Onset  . Diabetes Mother   . Hypertension Mother   . Cancer Father   . Fibroids Sister   . Hypertension Sister   . Crohn's disease Brother   . Cancer Brother   . Diabetes Brother   . Hypertension Brother   . Kidney disease Brother   . Breast cancer Maternal Aunt   . Breast cancer Paternal Aunt   . Anesthesia problems Neg Hx     Social History   Tobacco Use  . Smoking status: Never Smoker  . Smokeless tobacco: Never Used  Vaping Use  . Vaping Use: Never used  Substance Use Topics  . Alcohol use: No  .  Drug use: No    Home Medications Prior to Admission medications   Medication Sig Start Date End Date Taking? Authorizing Provider  amoxicillin (AMOXIL) 500 MG capsule Take 1 capsule (500 mg total) by mouth 2 (two) times daily for 10 days. 08/18/20 08/28/20 Yes Sherwood Gambler, MD  cyclobenzaprine (FLEXERIL) 10 MG tablet Take 1 tablet (10 mg total) by mouth 3 (three) times daily as needed for muscle spasms. 08/18/20  Yes Sherwood Gambler, MD  lidocaine (LIDODERM) 5 % Place 1 patch onto the skin daily. Remove & Discard patch within 12 hours or as directed by MD 08/18/20  Yes Sherwood Gambler, MD  dicyclomine (BENTYL) 20 MG tablet Take 1 tablet (20 mg total) by mouth 2 (two) times daily. 05/12/20   Joy, Shawn C, PA-C  ferrous sulfate 325 (65 FE) MG tablet Take 325 mg by mouth daily with breakfast.    [provider]  ibuprofen (ADVIL) 600 MG tablet Take 1 tablet (600 mg total) by mouth every 6 (six) hours as needed for up to 30 doses for mild pain or moderate pain. 03/27/20   Wyvonnia Dusky, MD   methylPREDNISolone (MEDROL DOSEPAK) 4 MG TBPK tablet Use as directed on package 03/27/20   Wyvonnia Dusky, MD  ondansetron (ZOFRAN ODT) 4 MG disintegrating tablet Take 1 tablet (4 mg total) by mouth every 8 (eight) hours as needed for nausea or vomiting. 05/12/20   Joy, Shawn C, PA-C  diphenhydrAMINE (BENADRYL) 12.5 MG/5ML liquid Take 12.5 mg by mouth once.  08/01/11  [provider]    Allergies    Philippa Sicks scolymus (artichoke)]  Review of Systems   Review of Systems  Constitutional: Negative for fever.  Respiratory: Negative for shortness of breath.   Gastrointestinal: Negative for abdominal pain.  Genitourinary: Negative for dysuria.  Musculoskeletal: Positive for back pain.  Neurological: Negative for weakness and numbness.  All other systems reviewed and are negative.   Physical Exam Updated Vital Signs BP 121/76 (BP Location: Right Arm)   Pulse 72   Temp 99.1 F (37.3 C) (Oral)   Resp 18   Ht 5\' 4"  (1.626 m)   Wt 108.9 kg   SpO2 100%   BMI 41.20 kg/m   Physical Exam Vitals and nursing note reviewed.  Constitutional:      Appearance: She is well-developed. She is not ill-appearing.  HENT:     Head: Normocephalic and atraumatic.     Right Ear: External ear normal. Tympanic membrane is erythematous.     Left Ear: Tympanic membrane and external ear normal.     Nose: Nose normal.  Eyes:     General:        Right eye: No discharge.        Left eye: No discharge.  Cardiovascular:     Rate and Rhythm: Normal rate and regular rhythm.     Heart sounds: Normal heart sounds.  Pulmonary:     Effort: Pulmonary effort is normal.     Breath sounds: Normal breath sounds.  Abdominal:     Palpations: Abdomen is soft.     Tenderness: There is no abdominal tenderness.  Musculoskeletal:     Thoracic back: Tenderness present.     Lumbar back: Tenderness present.       Back:     Comments: Diffuse right sided tenderness to palpation. No rash/skin  abnormalities.   Skin:    General: Skin is warm and dry.  Neurological:     Mental Status: She  is alert.     Comments: 5/5 strength in BLE. Grossly normal sensation  Psychiatric:        Mood and Affect: Mood is not anxious.     ED Results / Procedures / Treatments   Labs (all labs ordered are listed, but only abnormal results are displayed) Labs Reviewed - No data to display  EKG None  Radiology No results found.  Procedures Procedures   Medications Ordered in ED Medications - No data to display  ED Course  I have reviewed the triage vital signs and the nursing notes.  Pertinent labs & imaging results that were available during my care of the patient were reviewed by me and considered in my medical decision making (see chart for details).    MDM Rules/Calculators/A&P                          Given the location of pain in the length of pain with tenderness to palpation, this sounds muscular.  While she did recently have surgery a couple months ago, my suspicion that this is PE is pretty low.  There is no focal bony tenderness to suggest needing an x-ray.  No neuro symptoms to suggest that this is an acute spinal cord process.  At this point, continue Tylenol and NSAIDs and will add on Lidoderm and muscle relaxer.  She is also found to have an acute otitis media and will be given amoxicillin.  Otherwise, discharged home with return precautions.  Follow-up with PCP in 2 weeks as previously scheduled.  May need physical therapy. Final Clinical Impression(s) / ED Diagnoses Final diagnoses:  Acute right-sided thoracic back pain  Acute right otitis media    Rx / DC Orders ED Discharge Orders         Ordered    cyclobenzaprine (FLEXERIL) 10 MG tablet  3 times daily PRN        08/18/20 0944    lidocaine (LIDODERM) 5 %  Every 24 hours        08/18/20 0944    amoxicillin (AMOXIL) 500 MG capsule  2 times daily        08/18/20 0944           Sherwood Gambler, MD 08/18/20  [phone]

## 2020-09-02 ENCOUNTER — Ambulatory Visit: Payer: Medicaid Other | Admitting: Family Medicine

## 2020-09-10 ENCOUNTER — Other Ambulatory Visit: Payer: Self-pay

## 2020-09-10 ENCOUNTER — Emergency Department (HOSPITAL_BASED_OUTPATIENT_CLINIC_OR_DEPARTMENT_OTHER)
Admission: EM | Admit: 2020-09-10 | Discharge: 2020-09-10 | Disposition: A | Discharge status: Home / Self Care | Payer: Medicaid Other | Attending: Emergency Medicine | Admitting: Emergency Medicine

## 2020-09-10 ENCOUNTER — Encounter (HOSPITAL_BASED_OUTPATIENT_CLINIC_OR_DEPARTMENT_OTHER): Payer: Self-pay | Admitting: Emergency Medicine

## 2020-09-10 DIAGNOSIS — H5789 Other specified disorders of eye and adnexa: Secondary | ICD-10-CM | POA: Insufficient documentation

## 2020-09-10 DIAGNOSIS — R0981 Nasal congestion: Secondary | ICD-10-CM | POA: Diagnosis not present

## 2020-09-10 DIAGNOSIS — Z20822 Contact with and (suspected) exposure to covid-19: Secondary | ICD-10-CM | POA: Diagnosis not present

## 2020-09-10 DIAGNOSIS — R509 Fever, unspecified: Secondary | ICD-10-CM | POA: Insufficient documentation

## 2020-09-10 DIAGNOSIS — R0982 Postnasal drip: Secondary | ICD-10-CM | POA: Insufficient documentation

## 2020-09-10 DIAGNOSIS — R059 Cough, unspecified: Secondary | ICD-10-CM | POA: Insufficient documentation

## 2020-09-10 LAB — RESP PANEL BY RT-PCR (FLU A&B, COVID) ARPGX2
Influenza A by PCR: NEGATIVE
Influenza B by PCR: NEGATIVE
SARS Coronavirus 2 by RT PCR: NEGATIVE

## 2020-09-10 MED ORDER — AMOXICILLIN-POT CLAVULANATE 875-125 MG PO TABS: 1.0000 | ORAL_TABLET | Freq: Two times a day (BID) | ORAL | 0 refills | Status: DC

## 2020-09-10 NOTE — ED Triage Notes (Signed)
Pt arrives pov with cough, congestion and sore throat, HA, and ear itching x 3 days pta.

## 2020-09-10 NOTE — ED Provider Notes (Signed)
[name]   CSN: [id number] Arrival date & time: 09/10/20  1600     History Chief Complaint  Patient presents with  . Cough  . Sore Throat    [name] is a 45 y.o. female with past medical history significant for seasonal allergies who presents the ED with 1 day history of nonproductive cough, subjective fevers, and chills.  Patient states that she has been having seasonal allergy symptoms x1 month.  She is frequently sneezing and endorses itchy watery eyes.  She also states that her ears "tickle".  She states that she has postnasal drip which sometimes will cause mild throat symptoms.  She thinks that perhaps her postnasal drip precipitated her cough.  However, she is adamantly concerned that she has a sinus infection, particular given her fevers and chills.    She states that she has been doing Flonase, cetirizine, and various other seasonal allergy related medications religiously.  She states that she has failed all over-the-counter medications and things are simply getting worse.  She has had to be treated with Augmentin in the past for sinus infections that have not responded to regular medications.  She denies any chest pain, cough, hemoptysis, abdominal pain, nausea vomiting, urinary symptoms, or change in bowel habits.  All of her symptoms are entirely URI.  HPI     Past Medical History:  Diagnosis Date  . Abnormal Pap smear 08/30/2008   LGSIL  . Allergy   . BV (bacterial vaginosis)    recurring  . Chlamydia   . Chronic pain of left knee   . Ectopic pregnancy   . Fibroid   . Infertility    Secondary PCOS  . PCOS (polycystic ovarian syndrome)   . Shingles outbreak 11/2011  . Vertigo     Patient Active Problem List   Diagnosis Date Noted  . External hemorrhoids 07/19/2020  . 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 Mixed conductive and sensorineural hearing loss of right ear with restricted  hearing of left ear 08/10/2016  . Uterine fibroids, antepartum condition or complication 03/10/2535  . Ruptured ectopic pregnancy 07/05/2011  . Obesity 12/21/2010    Past Surgical History:  Procedure Laterality Date  . APPENDECTOMY    . DILATION AND CURETTAGE OF UTERUS  06/2011   ectopic  . EAR TUBE REMOVAL    . ECTOPIC PREGNANCY SURGERY    . LAPAROSCOPY  07/05/2011   Procedure: LAPAROSCOPY OPERATIVE;  Surgeon: Osborne Oman, MD;  Location: [facility];  Service: Gynecology;  Laterality: N/A;  . right ear surgery    . WISDOM TOOTH EXTRACTION       OB History    Gravida  5   Para  3   Term  3   Preterm  0   AB  2   Living  3     SAB  1   IAB  0   Ectopic  1   Multiple  0   Live Births  3           Family History  Problem Relation Age of Onset  . Diabetes Mother   . Hypertension Mother   . Cancer Father   . Fibroids Sister   . Hypertension Sister   . Crohn's disease Brother   . Cancer Brother   . Diabetes Brother   . Hypertension Brother   . Kidney disease Brother   . Breast cancer Maternal Aunt   . Breast cancer Paternal Aunt   .  Anesthesia problems Neg Hx     Social History   Tobacco Use  . Smoking status: Never Smoker  . Smokeless tobacco: Never Used  Vaping Use  . Vaping Use: Never used  Substance Use Topics  . Alcohol use: No  . Drug use: No    Home Medications Prior to Admission medications   Medication Sig Start Date End Date Taking? Authorizing Provider  amoxicillin-clavulanate (AUGMENTIN) 875-125 MG tablet Take 1 tablet by mouth every 12 (twelve) hours. 09/10/20  Yes Corena Herter, PA-C  cyclobenzaprine (FLEXERIL) 10 MG tablet Take 1 tablet (10 mg total) by mouth 3 (three) times daily as needed for muscle spasms. 08/18/20   Sherwood Gambler, MD  dicyclomine (BENTYL) 20 MG tablet Take 1 tablet (20 mg total) by mouth 2 (two) times daily. 05/12/20   Joy, Shawn C, PA-C  ferrous sulfate 325 (65 FE) MG tablet Take 325 mg by mouth daily  with breakfast.    [provider]  ibuprofen (ADVIL) 600 MG tablet Take 1 tablet (600 mg total) by mouth every 6 (six) hours as needed for up to 30 doses for mild pain or moderate pain. 03/27/20   Wyvonnia Dusky, MD  lidocaine (LIDODERM) 5 % Place 1 patch onto the skin daily. Remove & Discard patch within 12 hours or as directed by MD 08/18/20   Sherwood Gambler, MD  methylPREDNISolone (MEDROL DOSEPAK) 4 MG TBPK tablet Use as directed on package 03/27/20   Wyvonnia Dusky, MD  ondansetron (ZOFRAN ODT) 4 MG disintegrating tablet Take 1 tablet (4 mg total) by mouth every 8 (eight) hours as needed for nausea or vomiting. 05/12/20   Joy, Shawn C, PA-C  diphenhydrAMINE (BENADRYL) 12.5 MG/5ML liquid Take 12.5 mg by mouth once.  08/01/11  [provider]    Allergies    Philippa Sicks scolymus (artichoke)]  Review of Systems   Review of Systems  All other systems reviewed and are negative.   Physical Exam Updated Vital Signs BP 126/87 (BP Location: Right Arm)   Pulse 87   Temp 98.9 F (37.2 C) (Oral)   Resp 18   LMP 08/18/2020   SpO2 99%   Physical Exam Vitals and nursing note reviewed. Exam conducted with a chaperone present.  Constitutional:      General: She is not in acute distress.    Appearance: She is not toxic-appearing.  HENT:     Head: Normocephalic and atraumatic.     Comments: Mild maxillary and frontal sinus TTP.  No overlying skin changes.    Right Ear: Tympanic membrane, ear canal and external ear normal.     Left Ear: Tympanic membrane, ear canal and external ear normal.     Ears:     Comments: Scar tissue seen in right TM. Otherwise no abnormalities.    Nose: Congestion present.     Mouth/Throat:     Pharynx: Oropharynx is clear. No posterior oropharyngeal erythema.     Comments: Patent oropharynx.  No trismus.  No erythema or tonsillar hypertrophy.  No uvular deviation.  Tolerate secretions well. Eyes:     General: No scleral icterus.     Conjunctiva/sclera: Conjunctivae normal.  Cardiovascular:     Rate and Rhythm: Normal rate and regular rhythm.     Pulses: Normal pulses.  Pulmonary:     Effort: Pulmonary effort is normal. No respiratory distress.     Breath sounds: Normal breath sounds. No wheezing or rales.  Musculoskeletal:  General: Normal range of motion.     Cervical back: Normal range of motion. No rigidity.  Skin:    General: Skin is dry.  Neurological:     Mental Status: She is alert.     GCS: GCS eye subscore is 4. GCS verbal subscore is 5. GCS motor subscore is 6.  Psychiatric:        Mood and Affect: Mood normal.        Behavior: Behavior normal.        Thought Content: Thought content normal.     ED Results / Procedures / Treatments   Labs (all labs ordered are listed, but only abnormal results are displayed) Labs Reviewed  RESP PANEL BY RT-PCR (FLU A&B, COVID) [id number]    EKG None  Radiology No results found.  Procedures Procedures   Medications Ordered in ED Medications - No data to display  ED Course  I have reviewed the triage vital signs and the nursing notes.  Pertinent labs & imaging results that were available during my care of the patient were reviewed by me and considered in my medical decision making (see chart for details).    MDM Rules/Calculators/A&P                          D[place] Cantero was evaluated in [name] on 09/10/2020 for the symptoms described in the history of present illness. She was evaluated in the context of the global COVID-19 pandemic, which necessitated consideration that the patient might be at risk for infection with the SARS-CoV-2 virus that causes COVID-19. Institutional protocols and algorithms that pertain to the evaluation of patients at risk for COVID-19 are in a state of rapid change based on information released by regulatory bodies including the CDC and federal and state organizations. These policies and algorithms were  followed during the patient's care in the ED.  I personally reviewed patient's medical chart and all notes from triage and staff during today's encounter. I have also ordered and reviewed all labs and imaging that I felt to be medically necessary in the evaluation of this patient's complaints and with consideration of their physical exam. If needed, translation services were available and utilized.   Patient with a 1 day history of fevers, chills, and cough superimposed on a 3-week history of seasonal allergies/allergic rhinitis.  She states that she has tried every over-the-counter medication possible and that this happens every year.  She states that she has a history of sinus infections and requires antibiotics with Augmentin.  I informed her that Augmentin is typically over prescribed in the context of sinus infections, however given that she has seemingly failed outpatient management, feel as though it is not reasonable.  Especially given her recent fevers and chills and chronicity greater than 3 weeks.  We will give her delayed prescription.  If positive for COVID-19 or influenza, she will continue with her over-the-counter medications as needed for seasonal allergies including Flonase and cetirizine we will also filling prescription for Augmentin.  I have advised the importance of outpatient follow-up with a primary care provider for ongoing evaluation and management.  She may ultimately need to see a ENT specialist or allergist for ongoing evaluation and management of her acute on chronic symptoms.  Strict ED return precautions discussed.  Patient voices understanding is agreeable to the plan.  Final Clinical Impression(s) / ED Diagnoses Final diagnoses:  Sinus congestion    Rx / DC Orders ED Discharge  Orders         Ordered    amoxicillin-clavulanate (AUGMENTIN) 875-125 MG tablet  Every 12 hours        09/10/20 1916           Reita Chard 09/10/20 1917    Breck Coons, MD 09/12/20 0040

## 2020-09-10 NOTE — Discharge Instructions (Signed)
Please continue with your over-the-counter medications as needed for seasonal allergy symptoms/allergic rhinitis.  I have prescribed you Augmentin.  Please do not fill until you receive the results of your COVID-19 and influenza testing.  If you are positive, do not take the antibiotics as this is likely viral.  If negative, you can proceed with taking the antibiotics.  I cannot emphasize enough the importance of outpatient follow-up with a primary care provider for ongoing evaluation and management.  You may ultimately benefit from seeing a allergist or ENT specialist given your acute on chronic symptoms.  Return to the ER or seek immediate medical attention should you experience any new or worsening symptoms.

## 2020-09-29 ENCOUNTER — Encounter: Payer: Self-pay | Admitting: Family Medicine

## 2020-09-29 ENCOUNTER — Telehealth: Payer: Self-pay | Admitting: Family Medicine

## 2020-09-29 NOTE — Telephone Encounter (Signed)
Pt was no show for new patient appt twice (08/03/20 and 09/02/20). I will mail dismissal from [place].

## 2020-09-30 ENCOUNTER — Emergency Department (HOSPITAL_BASED_OUTPATIENT_CLINIC_OR_DEPARTMENT_OTHER): Payer: Medicaid Other

## 2020-09-30 ENCOUNTER — Encounter (HOSPITAL_BASED_OUTPATIENT_CLINIC_OR_DEPARTMENT_OTHER): Payer: Self-pay | Admitting: *Deleted

## 2020-09-30 ENCOUNTER — Other Ambulatory Visit: Payer: Self-pay

## 2020-09-30 ENCOUNTER — Emergency Department (HOSPITAL_BASED_OUTPATIENT_CLINIC_OR_DEPARTMENT_OTHER)
Admission: EM | Admit: 2020-09-30 | Discharge: 2020-09-30 | Disposition: A | Discharge status: Home / Self Care | Payer: Medicaid Other | Attending: Emergency Medicine | Admitting: Emergency Medicine

## 2020-09-30 DIAGNOSIS — S5012XA Contusion of left forearm, initial encounter: Secondary | ICD-10-CM | POA: Diagnosis not present

## 2020-09-30 DIAGNOSIS — Y990 Civilian activity done for income or pay: Secondary | ICD-10-CM | POA: Diagnosis not present

## 2020-09-30 DIAGNOSIS — W010XXA Fall on same level from slipping, tripping and stumbling without subsequent striking against object, initial encounter: Secondary | ICD-10-CM | POA: Insufficient documentation

## 2020-09-30 DIAGNOSIS — R079 Chest pain, unspecified: Secondary | ICD-10-CM | POA: Diagnosis not present

## 2020-09-30 DIAGNOSIS — W19XXXA Unspecified fall, initial encounter: Secondary | ICD-10-CM

## 2020-09-30 DIAGNOSIS — S39012A Strain of muscle, fascia and tendon of lower back, initial encounter: Secondary | ICD-10-CM | POA: Insufficient documentation

## 2020-09-30 DIAGNOSIS — M79632 Pain in left forearm: Secondary | ICD-10-CM | POA: Diagnosis not present

## 2020-09-30 DIAGNOSIS — S3992XA Unspecified injury of lower back, initial encounter: Secondary | ICD-10-CM | POA: Diagnosis present

## 2020-09-30 DIAGNOSIS — M545 Low back pain, unspecified: Secondary | ICD-10-CM | POA: Diagnosis not present

## 2020-09-30 MED ORDER — IBUPROFEN 600 MG PO TABS: 600.0000 mg | ORAL_TABLET | Freq: Four times a day (QID) | ORAL | 0 refills | Status: DC | PRN

## 2020-09-30 MED ORDER — METHOCARBAMOL 500 MG PO TABS: 500.0000 mg | ORAL_TABLET | Freq: Two times a day (BID) | ORAL | 0 refills | Status: DC

## 2020-09-30 MED ORDER — KETOROLAC TROMETHAMINE 30 MG/ML IJ SOLN
30.0000 mg | Freq: Once | INTRAMUSCULAR | Status: AC
  Administered 2020-09-30: 30 mg via INTRAMUSCULAR
  Filled 2020-09-30: qty 1

## 2020-09-30 NOTE — ED Notes (Signed)
ED Provider at bedside. 

## 2020-09-30 NOTE — ED Triage Notes (Signed)
States fell on concrete floor at work several hours ago, fell onto left arm . Has left forearm pain. Has Rt lower back pain as well
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
## 2020-09-30 NOTE — ED Notes (Signed)
Patient transported to X-ray 

## 2020-09-30 NOTE — ED Notes (Signed)
Denies hitting head, no LOC

## 2020-10-03 ENCOUNTER — Telehealth: Payer: Self-pay

## 2020-10-03 DIAGNOSIS — D373 Neoplasm of uncertain behavior of appendix: Secondary | ICD-10-CM

## 2020-10-03 NOTE — Telephone Encounter (Signed)
Transition Care Management Unsuccessful Follow-up Telephone Call  Date of discharge and from where: 09/30/2020 from [facility]  Attempts:  1st Attempt  Reason for unsuccessful TCM follow-up call:  Unable to leave message

## 2020-10-04 NOTE — Telephone Encounter (Signed)
Transition Care Management Follow-up Telephone Call  Date of discharge and from where: 09/30/2020 from [facility]  How have you been since you were released from the hospital? Pt stated that she is still having pain in her back and arm. Rx are not helping.   Any questions or concerns? No   Pt has been out of work for sometime due to illnesses.   Items Reviewed:  Did the pt receive and understand the discharge instructions provided? Yes   Medications obtained and verified? Yes   Other? No   Any new allergies since your discharge? No   Dietary orders reviewed? n/a  Do you have support at home? Yes   Functional Questionnaire: (I = Independent and D = Dependent) ADLs: I  Bathing/Dressing- I  Meal Prep- I  Eating- I  Maintaining continence- I  Transferring/Ambulation- I  Managing Meds- I   Follow up appointments reviewed:   PCP Hospital f/u appt confirmed? No    Specialist Hospital f/u appt confirmed? No  Encouraged pt to see ortho or specialists that may be able help with pain.   Are transportation arrangements needed? No   If their condition worsens, is the pt aware to call PCP or go to the Emergency Dept.? Yes  Was the patient provided with contact information for the PCP's office or ED? Yes  Was to pt encouraged to call back with questions or concerns? Yes

## 2020-10-15 ENCOUNTER — Emergency Department (HOSPITAL_BASED_OUTPATIENT_CLINIC_OR_DEPARTMENT_OTHER)
Admission: EM | Admit: 2020-10-15 | Discharge: 2020-10-15 | Disposition: A | Discharge status: Home / Self Care | Payer: Medicaid Other | Attending: Emergency Medicine | Admitting: Emergency Medicine

## 2020-10-15 ENCOUNTER — Encounter (HOSPITAL_BASED_OUTPATIENT_CLINIC_OR_DEPARTMENT_OTHER): Payer: Self-pay

## 2020-10-15 ENCOUNTER — Other Ambulatory Visit: Payer: Self-pay

## 2020-10-15 DIAGNOSIS — H65191 Other acute nonsuppurative otitis media, right ear: Secondary | ICD-10-CM | POA: Diagnosis not present

## 2020-10-15 DIAGNOSIS — M546 Pain in thoracic spine: Secondary | ICD-10-CM | POA: Diagnosis not present

## 2020-10-15 DIAGNOSIS — H9201 Otalgia, right ear: Secondary | ICD-10-CM | POA: Diagnosis present

## 2020-10-15 DIAGNOSIS — H65194 Other acute nonsuppurative otitis media, recurrent, right ear: Secondary | ICD-10-CM | POA: Insufficient documentation

## 2020-10-15 DIAGNOSIS — M545 Low back pain, unspecified: Secondary | ICD-10-CM | POA: Insufficient documentation

## 2020-10-15 DIAGNOSIS — G8929 Other chronic pain: Secondary | ICD-10-CM | POA: Insufficient documentation

## 2020-10-15 MED ORDER — AMOXICILLIN-POT CLAVULANATE 875-125 MG PO TABS: 1.0000 | ORAL_TABLET | Freq: Two times a day (BID) | ORAL | 0 refills | Status: AC

## 2020-10-15 MED ORDER — IBUPROFEN 800 MG PO TABS: 800.0000 mg | ORAL_TABLET | Freq: Three times a day (TID) | ORAL | 0 refills | Status: DC

## 2020-10-15 MED ORDER — LIDOCAINE 5 % EX PTCH: 1.0000 | MEDICATED_PATCH | CUTANEOUS | 0 refills | Status: DC

## 2020-10-15 MED ORDER — KETOROLAC TROMETHAMINE 60 MG/2ML IM SOLN
60.0000 mg | Freq: Once | INTRAMUSCULAR | Status: AC
  Administered 2020-10-15: 60 mg via INTRAMUSCULAR
  Filled 2020-10-15: qty 2

## 2020-10-15 NOTE — ED Triage Notes (Signed)
R ear pain 2 weeks that is worsening today.  Pt treated for sinus infection recently.  Pt also c/o R middle back pain since last visit.  Pt also reports some feeling of being off balance with the ear pain.

## 2020-10-15 NOTE — ED Provider Notes (Signed)
[facility] Provider Note   CSN: [id number] Arrival date & time: 10/15/20  1124     History Chief Complaint  Patient presents with  . Ear Pain    Melissa [name] is a 45 y.o. female.  HPI 45 year old female with a history of BV, fibroids, PCOS, ruptured ectopic, frequent ear infections, back pain presents to the ER with complaints of right ear pain and back pain.  Patient fell at work several weeks ago and continues to have right-sided back pain.  She has been seen here multiple times for this, with x-rays which were overall reassuring.  She has been taking the muscle relaxers, only took 1 ibuprofen with little relief.  She feels like she is having muscle spasms in the right side of her back.  She denies any loss of bowel bladder control, numbness or tingling.  No history of IV drug use.  She also complains of ongoing right ear pain, recently finished a course of amoxicillin with little relief.  Continues to have pain and itching inside her ear.  She also states that whenever takes antibiotics, she often gets yeast infections.  She feels like her hearing is decreased out of the right ear.    Past Medical History:  Diagnosis Date  . Abnormal Pap smear 08/30/2008   LGSIL  . Allergy   . BV (bacterial vaginosis)    recurring  . Chlamydia   . Chronic pain of left knee   . Ectopic pregnancy   . Fibroid   . Infertility    Secondary PCOS  . PCOS (polycystic ovarian syndrome)   . Shingles outbreak 11/2011  . Vertigo     Patient Active Problem List   Diagnosis Date Noted  . External hemorrhoids 07/19/2020  . 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 Uterine fibroids, antepartum condition or complication 95/18/8416  . Ruptured ectopic pregnancy 07/05/2011  . Obesity 12/21/2010    Past Surgical History:  Procedure Laterality Date   . APPENDECTOMY    . DILATION AND CURETTAGE OF UTERUS  06/2011   ectopic  . EAR TUBE REMOVAL    . ECTOPIC PREGNANCY SURGERY    . LAPAROSCOPY  07/05/2011   Procedure: LAPAROSCOPY OPERATIVE;  Surgeon: Osborne Oman, MD;  Location: [facility];  Service: Gynecology;  Laterality: N/A;  . right ear surgery    . WISDOM TOOTH EXTRACTION       OB History    Gravida  5   Para  3   Term  3   Preterm  0   AB  2   Living  3     SAB  1   IAB  0   Ectopic  1   Multiple  0   Live Births  3           Family History  Problem Relation Age of Onset  . Diabetes Mother   . Hypertension Mother   . Cancer Father   . Fibroids Sister   . Hypertension Sister   . Crohn's disease Brother   . Cancer Brother   . Diabetes Brother   . Hypertension Brother   . Kidney disease Brother   . Breast cancer Maternal Aunt   . Breast cancer Paternal Aunt   . Anesthesia problems Neg Hx     Social History   Tobacco Use  . Smoking status: Never Smoker  .  Smokeless tobacco: Never Used  Vaping Use  . Vaping Use: Never used  Substance Use Topics  . Alcohol use: No  . Drug use: No    Home Medications Prior to Admission medications   Medication Sig Start Date End Date Taking? Authorizing Provider  ibuprofen (ADVIL) 800 MG tablet Take 1 tablet (800 mg total) by mouth 3 (three) times daily. 10/15/20  Yes Sharyn Lull A, PA-C  lidocaine (LIDODERM) 5 % Place 1 patch onto the skin daily. Remove & Discard patch within 12 hours or as directed by MD 10/15/20  Yes Garald Balding, PA-C  amoxicillin-clavulanate (AUGMENTIN) 875-125 MG tablet Take 1 tablet by mouth 2 (two) times daily for 7 days. 10/15/20 10/22/20  Garald Balding, PA-C  cyclobenzaprine (FLEXERIL) 10 MG tablet Take 1 tablet (10 mg total) by mouth 3 (three) times daily as needed for muscle spasms. 08/18/20   Sherwood Gambler, MD  dicyclomine (BENTYL) 20 MG tablet Take 1 tablet (20 mg total) by mouth 2 (two) times daily. 05/12/20   Joy, Shawn C,  PA-C  ferrous sulfate 325 (65 FE) MG tablet Take 325 mg by mouth daily with breakfast.    [provider]  methocarbamol (ROBAXIN) 500 MG tablet Take 1 tablet (500 mg total) by mouth 2 (two) times daily. 09/30/20   Isla Pence, MD  methylPREDNISolone (MEDROL DOSEPAK) 4 MG TBPK tablet Use as directed on package 03/27/20   Wyvonnia Dusky, MD  ondansetron (ZOFRAN ODT) 4 MG disintegrating tablet Take 1 tablet (4 mg total) by mouth every 8 (eight) hours as needed for nausea or vomiting. 05/12/20   Joy, Shawn C, PA-C  diphenhydrAMINE (BENADRYL) 12.5 MG/5ML liquid Take 12.5 mg by mouth once.  08/01/11  [provider]    Allergies    Philippa Sicks scolymus (artichoke)]  Review of Systems   Review of Systems  Constitutional: Negative for chills and fever.  HENT: Positive for ear pain.   Musculoskeletal: Positive for back pain.  Neurological: Negative for weakness and numbness.    Physical Exam Updated Vital Signs BP 138/84 (BP Location: Right Arm)   Pulse 83   Temp 98.6 F (37 C) (Oral)   Resp 20   Ht 5\' 4"  (1.626 m)   Wt 108.9 kg   SpO2 100%   BMI 41.21 kg/m   Physical Exam HENT:     Head:     Comments: Right TM bulging, erythematous.  No tragal tenderness, no mastoid tenderness. Musculoskeletal:       Back:     Comments: Right-sided paraspinal lumbar/thoracic muscle tenderness.  No midline tenderness of the C, T, L-spine.  5/5 strength in upper lower extremities bilaterally, neurovascularly intact.     ED Results / Procedures / Treatments   Labs (all labs ordered are listed, but only abnormal results are displayed) Labs Reviewed - No data to display  EKG None  Radiology No results found.  Procedures Procedures   Medications Ordered in ED Medications  ketorolac (TORADOL) injection 60 mg (has no administration in time range)    ED Course  I have reviewed the triage vital signs and the nursing notes.  Pertinent labs & imaging results  that were available during my care of the patient were reviewed by me and considered in my medical decision making (see chart for details).    MDM Rules/Calculators/A&P                         45 year old  female presents to the ER with complaints of back pain and right ear pain.  On exam, she has thoracic and lumbar paraspinal muscle tenderness, patient been seen here multiple times for this with negative x-rays.  Reports muscle relaxers are not helping.  She has only taken 1 pill of ibuprofen.  Not using Lidoderm patch.  Encouraged the patient to start taking anti-inflammatories and utilizing the Lidoderm patch more.  I offered to try another muscle relaxer, however she denied this.  She does not have a PCP currently, but is looking for 1.  I explained that she would likely benefit from some physical therapy.  Patient received Toradol injection here.  Low suspicion for cauda equina, acute fracture, spinal abscess.  Right ear with bulging erythematous TM, no tragal tenderness, no mastoid tenderness.  Low suspicion for otitis externa/malignant otitis media/mastoiditis.  Will trial Augmentin and refer to ENT.  We discussed return precautions.  She voiced her standing and is agreeable.  Stable for discharge.  Case discussed with Dr. Almyra Free who is agreeable to the above plan and disposition   Final Clinical Impression(s) / ED Diagnoses Final diagnoses:  Other recurrent acute nonsuppurative otitis media of right ear  Chronic right-sided low back pain without sciatica    Rx / DC Orders ED Discharge Orders         Ordered    amoxicillin-clavulanate (AUGMENTIN) 875-125 MG tablet  2 times daily        10/15/20 1155    lidocaine (LIDODERM) 5 %  Every 24 hours        10/15/20 1155    ibuprofen (ADVIL) 800 MG tablet  3 times daily        10/15/20 1155           Lyndel Safe 10/15/20 1157    Luna Fuse, MD 10/15/20 1211

## 2020-10-15 NOTE — Discharge Instructions (Addendum)
You were evaluated in the Emergency Department and after careful evaluation, we did not find any emergent condition requiring admission or further testing in the hospital.  The right ear still does look infected, please take Augmentin as directed until finished.  If you start to develop yeast infections, you may take over-the-counter medicines such as Monistat.  Please make sure to follow-up with the ear nose and throat doctors whose contact information provided in your discharge paperwork.  For your back, continue to take 800 mg of ibuprofen up to 3 times daily for a week.  You may also use the Lidoderm patches as prescribed.  Please make sure to follow-up with a primary care doctor, as you would likely benefit from some physical therapy.  Please return to the Emergency Department if you experience any worsening of your condition.    Thank you for allowing [place] to be a part of your care.

## 2020-10-17 ENCOUNTER — Telehealth: Payer: Self-pay

## 2020-10-17 NOTE — Telephone Encounter (Signed)
Transition Care Management Follow-up Telephone Call  Date of discharge and from where: 10/15/2020 from [facility]  How have you been since you were released from the hospital? Pt stated that she is feeling better and has no questions or concerns at this time.   Any questions or concerns? No  Items Reviewed:  Did the pt receive and understand the discharge instructions provided? Yes   Medications obtained and verified? Yes   Other? No   Any new allergies since your discharge? No   Dietary orders reviewed? n/a  Do you have support at home? Yes   Functional Questionnaire: (I = Independent and D = Dependent) ADLs: I  Bathing/Dressing- I  Meal Prep- I  Eating- I  Maintaining continence- I  Transferring/Ambulation- I  Managing Meds- I  Follow up appointments reviewed:   PCP Hospital f/u appt confirmed? No    Specialist Hospital f/u appt confirmed? No    Are transportation arrangements needed? No   If their condition worsens, is the pt aware to call PCP or go to the Emergency Dept.? Yes  Was the patient provided with contact information for the PCP's office or ED? Yes  Was to pt encouraged to call back with questions or concerns? Yes

## 2020-10-26 ENCOUNTER — Other Ambulatory Visit: Payer: Self-pay

## 2020-10-26 ENCOUNTER — Other Ambulatory Visit: Payer: Self-pay | Admitting: *Deleted

## 2020-10-26 DIAGNOSIS — Z9049 Acquired absence of other specified parts of digestive tract: Secondary | ICD-10-CM | POA: Insufficient documentation

## 2020-10-26 NOTE — Patient Outreach (Signed)
Medicaid Managed Care   Nurse Care Manager Note  10/26/2020 Name:  Nicolas Sisler MRN:  [id number] DOB:  [birth year]  Shonna Deiter is an 45 y.o. year old female who is a primary patient of Patient, No Pcp Per (Inactive).  The [facility] Managed Care Coordination team was consulted for assistance with:    Health management needs  [name] was given information about Medicaid Managed Care Coordination team services today. [name] agreed to services and verbal consent obtained.  Engaged with patient by telephone for initial visit in response to provider referral for case management and/or care coordination services.   Assessments/Interventions:  Review of past medical history, allergies, medications, health status, including review of consultants reports, laboratory and other test data, was performed as part of comprehensive evaluation and provision of chronic care management services.  SDOH (Social Determinants of Health) assessments and interventions performed:   Care Plan  Allergies  Allergen Reactions   Artichoke [Cynara Scolymus (Artichoke)] Anaphylaxis    Medications Reviewed Today     Reviewed by Melissa Montane, RN (Registered Nurse) on 10/26/20 at [place] List Status: <None>   Medication Order Taking? Sig Documenting Provider Last Dose Status Informant  cyclobenzaprine (FLEXERIL) 10 MG tablet [id number] Yes Take 1 tablet (10 mg total) by mouth 3 (three) times daily as needed for muscle spasms. Sherwood Gambler, MD Taking Active   dicyclomine (BENTYL) 20 MG tablet [id number] No Take 1 tablet (20 mg total) by mouth 2 (two) times daily.  Patient not taking: Reported on 10/26/2020   Lorayne Bender, PA-C Not Taking Active     Discontinued 08/01/11 1252 (Error) ferrous sulfate 325 (65 FE) MG tablet [id number] No Take 325 mg by mouth daily with breakfast.  Patient not taking: Reported on 10/26/2020   [provider] Not Taking Active Self  ibuprofen (ADVIL) 800 MG  tablet [id number] Yes Take 1 tablet (800 mg total) by mouth 3 (three) times daily. Garald Balding, PA-C Taking Active   lidocaine (LIDODERM) 5 % [id number] No Place 1 patch onto the skin daily. Remove & Discard patch within 12 hours or as directed by MD  Patient not taking: Reported on 10/26/2020   Garald Balding, PA-C Not Taking Active            Med Note (Yumi Insalaco A   Wed Oct 26, 2020  9:04 AM) Needs to pick up prescription  methocarbamol (ROBAXIN) 500 MG tablet [id number] No Take 1 tablet (500 mg total) by mouth 2 (two) times daily.  Patient not taking: Reported on 10/26/2020   Isla Pence, MD Not Taking Active   methylPREDNISolone (MEDROL DOSEPAK) 4 MG TBPK tablet [id number] No Use as directed on package  Patient not taking: Reported on 10/26/2020   Wyvonnia Dusky, MD Not Taking Active   ondansetron (ZOFRAN ODT) 4 MG disintegrating tablet [id number] No Take 1 tablet (4 mg total) by mouth every 8 (eight) hours as needed for nausea or vomiting.  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   Mixed conductive and sensorineural hearing loss of right  ear with restricted hearing of left ear 08/10/2016   Uterine fibroids, antepartum condition or complication 17/91/5056   Ruptured ectopic pregnancy 07/05/2011   Obesity 12/21/2010    Conditions to be addressed/monitored per PCP order:   health management needs  Care Plan : General Plan of Care (Adult)  Updates made by Melissa Montane, RN since 10/26/2020 12:00 AM     Problem: Health Promotion or Disease Self-Management (General Plan of Care)      Goal: Self-Management Plan Developed   Start Date: 10/26/2020  Expected End Date: 11/23/2020   This Visit's Progress: On track  Priority: High  Note:   Current Barriers:  Ineffective Self Health Maintenance-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 She has back pain from walking on hard floors at work. She reports hearing loss in her right ear and has not seen an ENT in almost 2 years. She does not have a hearing aid. [name] would like to schedule a PCP near her home. Our visit was cut short today, as she had to take care of something for her child. Does not maintain contact with provider office Currently UNABLE TO independently self manage needs related to chronic health conditions.  Knowledge Deficits related to short term plan for care coordination needs and long term plans for chronic disease management needs Nurse Case Manager Clinical Goal(s):  patient will work with care management team to address care coordination and chronic disease management needs related to Care Coordination   Interventions:  Evaluation of current treatment plan related to health maintenanc and patient's adherence to plan as established by provider. Advised patient to call and schedule PCP and ENT appointment Reviewed medications with patient and discussed taking as directed Discussed plans with patient for ongoing care management follow up and provided patient with direct contact information for care management team Self Care Activities:  Patient will self administer medications as prescribed Patient will attend all scheduled provider appointments Patient will call pharmacy for medication refills Patient will call provider office for new concerns or questions Patient Goals: - to schedule appointment with PCP and ENT office - keep a calendar with prescription refill dates - keep a calendar with appointment dates  Follow Up Plan: Telephone follow up appointment with care management team member scheduled for:11/09/20 @  10:30am      Follow Up:  Patient agrees to Care Plan and Follow-up.  Plan: The Managed Medicaid care management team will reach out to the patient again over the next 14 days.  Date/time of next scheduled RN care management/care coordination outreach:  11/09/20 @ 10:30am  [name] RN, [place] RN Care Coordinator

## 2020-10-26 NOTE — Patient Instructions (Signed)
Visit Information  [name] was given information about Medicaid Managed Care team care coordination services as a part of their Healthy [facility] Medicaid benefit. Melissa [name] verbally consented to engagement with the [facility] Managed Care team.   For questions related to your Healthy [facility] health plan, please call: [phone] or visit the homepage here: [email or web address]  If you would like to schedule transportation through your Healthy [facility] plan, please call the following number at least 2 days in advance of your appointment: [phone]   Call the [place] at [phone], at any time, 24 hours a day, 7 days a week. If you are in danger or need immediate medical attention call 911.  [name] - following are the goals we discussed in your visit today:   Goals Addressed             This Visit's Progress    Make and Keep All Appointments       Timeframe:  Short-Term Goal Priority:  High Start Date:   10/26/20                          Expected End Date:  11/23/20                     Follow Up Date 11/09/20   - to schedule appointment with PCP and ENT office - keep a calendar with prescription refill dates - keep a calendar with appointment dates    Why is this important?   Part of staying healthy is seeing the doctor for follow-up care.  If you forget your appointments, there are some things you can do to stay on track.             Please see education materials related to back pain provided by MyChart link.  Patient verbalizes understanding of instructions provided today.   Telephone follow up appointment with Managed Medicaid care management team member scheduled for:11/09/20 @ 10:30am  [name] RN, BSN [place] RN Care Coordinator   Following is a copy of your plan of care:  Patient Care Plan: General Plan of Care (Adult)     Problem Identified:  Health Promotion or Disease Self-Management (General Plan of Care)      Goal: Self-Management Plan Developed   Start Date: 10/26/2020  Expected End Date: 11/23/2020  This Visit's Progress: On track  Priority: High  Note:   Current Barriers:  Ineffective Self Health Maintenance-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 She reports hearing loss in her right ear and has not seen an ENT in almost 2 years. She does not have a hearing aid. [name] would like to schedule a PCP near her home. Our visit was cut short today, as she had to take care of something for her child. Does not maintain contact with provider office Currently UNABLE TO independently self manage needs related to chronic health conditions.  Knowledge Deficits related to short term plan for care coordination needs and long term plans for chronic disease management needs Nurse Case Manager Clinical Goal(s):  patient will work with care management team to address care coordination and chronic disease management needs related to Care Coordination   Interventions:  Evaluation of current treatment plan related to health  maintenanc and patient's adherence to plan as established by provider. Advised patient to call and schedule PCP and ENT appointment Reviewed medications with patient and discussed taking as directed Discussed plans with patient for ongoing care management follow up and provided patient with direct contact information for care management team Self Care Activities:  Patient will self administer medications as prescribed Patient will attend all scheduled provider appointments Patient will call pharmacy for medication refills Patient will call provider office for new concerns or questions Patient Goals: - to schedule appointment with PCP and ENT office - keep a calendar with  prescription refill dates - keep a calendar with appointment dates  Follow Up Plan: Telephone follow up appointment with care management team member scheduled for:11/09/20 @ 10:30am

## 2020-11-02 ENCOUNTER — Telehealth: Payer: Self-pay | Admitting: Licensed Clinical Social Worker

## 2020-11-02 ENCOUNTER — Ambulatory Visit: Payer: Self-pay

## 2020-11-02 DIAGNOSIS — M7751 Other enthesopathy of right foot: Secondary | ICD-10-CM | POA: Diagnosis not present

## 2020-11-02 DIAGNOSIS — M722 Plantar fascial fibromatosis: Secondary | ICD-10-CM | POA: Diagnosis not present

## 2020-11-02 DIAGNOSIS — M7752 Other enthesopathy of left foot: Secondary | ICD-10-CM | POA: Diagnosis not present

## 2020-11-02 NOTE — Patient Outreach (Addendum)
   [facility]) Care Management  [facility] Social Work  11/02/2020  Melissa [name] 04-Apr-1976 [id number]   Encounter Medications:  Outpatient Encounter Medications as of 11/02/2020  Medication Sig Note   cyclobenzaprine (FLEXERIL) 10 MG tablet Take 1 tablet (10 mg total) by mouth 3 (three) times daily as needed for muscle spasms.    dicyclomine (BENTYL) 20 MG tablet Take 1 tablet (20 mg total) by mouth 2 (two) times daily. (Patient not taking: Reported on 10/26/2020)    ferrous sulfate 325 (65 FE) MG tablet Take 325 mg by mouth daily with breakfast. (Patient not taking: Reported on 10/26/2020)    ibuprofen (ADVIL) 800 MG tablet Take 1 tablet (800 mg total) by mouth 3 (three) times daily.    lidocaine (LIDODERM) 5 % Place 1 patch onto the skin daily. Remove & Discard patch within 12 hours or as directed by MD (Patient not taking: Reported on 10/26/2020) 10/26/2020: Needs to pick up prescription   methocarbamol (ROBAXIN) 500 MG tablet Take 1 tablet (500 mg total) by mouth 2 (two) times daily. (Patient not taking: Reported on 10/26/2020)    methylPREDNISolone (MEDROL DOSEPAK) 4 MG TBPK tablet Use as directed on package (Patient not taking: Reported on 10/26/2020)    ondansetron (ZOFRAN ODT) 4 MG disintegrating tablet Take 1 tablet (4 mg total) by mouth every 8 (eight) hours as needed for nausea or vomiting. (Patient not taking: Reported on 10/26/2020) 10/26/2020: Ran out   [DISCONTINUED] diphenhydrAMINE (BENADRYL) 12.5 MG/5ML liquid Take 12.5 mg by mouth once.    No facility-administered encounter medications on file as of 11/02/2020.    Functional Status:  No flowsheet data found.  Fall/Depression Screening:  PHQ 2/9 Scores 08/01/2016 08/11/2015 10/19/2013  PHQ - 2 Score 0 0 0  PHQ- 9 Score 6 - -   LCSW completed [facility] outreach attempt today but was unable to reach patient successfully. A HIPPA compliant voice message was not able to be left encouraging patient to return call once  available. LCSW will reschedule patient's [facility] Social Work appointment if no return call has been made.  Eula Fried, BSW, MSW, [employer] Managed Medicaid [email or web address] [email or web address] Phone: [phone]   Plan:  Follow-up:  Follow-up in 2 week(s)

## 2020-11-02 NOTE — Patient Instructions (Signed)
Melissa [name] ,   The [facility] Managed Care Team is available to provide assistance to you with your healthcare needs at no cost and as a benefit of your [facility] plan. Please reach out to me at the number below. I am available to be of assistance to you regarding your healthcare needs. .   Thank you,   Eula Fried, BSW, MSW, LCSW Managed Medicaid LCSW [email or web address] Phone: [phone]

## 2020-11-09 ENCOUNTER — Other Ambulatory Visit: Payer: Self-pay

## 2020-11-09 ENCOUNTER — Other Ambulatory Visit: Payer: Self-pay | Admitting: *Deleted

## 2020-11-09 ENCOUNTER — Ambulatory Visit: Payer: Medicaid Other

## 2020-11-09 NOTE — Patient Instructions (Signed)
Visit Information  Ms. [name]  - as a part of your Medicaid benefit, you are eligible for care management and care coordination services at no cost or copay. I was unable to reach you by phone today but would be happy to help you with your health related needs. Please feel free to call me @ [place] number).   A member of the Managed Medicaid care management team will reach out to you again over the next 10 days. Patient asked for a telephone call back on 11/23/20  Mickel Fuchs, Arita Miss, [place] Medicaid Team  [phone]

## 2020-11-09 NOTE — Patient Outreach (Signed)
Care Coordination  11/09/2020  Melissa [name] 03-01-1976 [id number]  Successful outreach with [name], however, she needed to get ready for work and requested to reschedule today's visit. A new appointment was scheduled for 11/25/20 @ 1pm. Patient agreed to date and time.  [name] RN, BSN [place]  [employer] manager

## 2020-11-09 NOTE — Patient Outreach (Signed)
Care Coordination  11/09/2020  Melissa [name] 1975-12-23 [id number]   Medicaid Managed Care   Unsuccessful Outreach Note  11/09/2020 Name: [name] MRN: [id number] DOB: March 25, 1976  Referred by: Patient, No Pcp Per (Inactive) Reason for referral : High Risk Managed Medicaid (MM Unsuccessful Telephone Outreach)   An unsuccessful telephone outreach was attempted today. The patient was referred to the case management team for assistance with care management and care coordination.   Follow Up Plan: Patient asked for a telephone call back on 11/23/20 at [place], [place], [place] Medicaid Team  [phone]

## 2020-11-09 NOTE — Patient Outreach (Signed)
Care Coordination  11/09/2020  Melissa [name] [birth year] [id number]  BSW was able to speak with patient. Patient asked for a telephone call back at 2:30.  Mickel Fuchs, BSW, [place] Managed Medicaid Team  [phone]

## 2020-11-15 ENCOUNTER — Other Ambulatory Visit: Payer: Self-pay | Admitting: Licensed Clinical Social Worker

## 2020-11-15 NOTE — Patient Instructions (Signed)
Visit Information  [name] was given information about Medicaid Managed Care team care coordination services as a part of their Healthy [facility] Medicaid benefit. [name] verbally consented to engagement with the [facility] Managed Care team.   For questions related to your Healthy [facility] health plan, please call: [phone] or visit the homepage here: [email or web address]  If you would like to schedule transportation through your Healthy [facility] plan, please call the following number at least 2 days in advance of your appointment: [phone]   Call the [place] at [phone], at any time, 24 hours a day, 7 days a week. If you are in danger or need immediate medical attention call 911.  Eula Fried, BSW, MSW, [employer] Managed Medicaid LCSW [email or web address] Phone: [phone]

## 2020-11-15 NOTE — Patient Outreach (Addendum)
Medicaid Managed Care Social Work Note  11/15/2020 Name:  Melissa [name] MRN:  [id number] DOB:  04-24-1976  Melissa [name] is an 45 y.o. year old female who is a primary patient of Patient, No Pcp Per (Inactive).  The Medicaid Managed Care Coordination team was consulted for assistance with:  [place] and Resources  [name] was given information about Medicaid Managed CareCoordination services today. [name] agreed to services and verbal consent obtained for [facility] program but patient declined the need for [facility] LCSW. Patient reports that both her children and herself are already receiving therapy at [facility]. Patient has no social work needs or goals at this time. Family is satisfied with current treatment but will notify [facility] team if she changes her mind and wants social work assistance or any additional mental health support/resource implementation.   Engaged with patient  for by telephone forinitial visit in response to referral for case management and/or care coordination services.   Assessments/Interventions:  Review of past medical history, allergies, medications, health status, including review of consultants reports, laboratory and other test data, was performed as part of comprehensive evaluation and provision of chronic care management services.  SDOH: (Social Determinant of Health) assessments and interventions performed: SDOH Interventions    Flowsheet Row Most Recent Value  SDOH Interventions   SDOH Interventions for the Following Domains Depression  Depression Interventions/Treatment  Currently on Treatment      Care Plan                 Allergies  Allergen Reactions   Artichoke [Cynara Scolymus (Artichoke)] Anaphylaxis    Medications Reviewed Today     Reviewed by Melissa Montane, RN (Registered Nurse) on 10/26/20 at [place] List Status: <None>   Medication Order Taking? Sig Documenting Provider Last Dose Status Informant   cyclobenzaprine (FLEXERIL) 10 MG tablet [id number] Yes Take 1 tablet (10 mg total) by mouth 3 (three) times daily as needed for muscle spasms. Sherwood Gambler, MD Taking Active   dicyclomine (BENTYL) 20 MG tablet [id number] No Take 1 tablet (20 mg total) by mouth 2 (two) times daily.  Patient not taking: Reported on 10/26/2020   Lorayne Bender, PA-C Not Taking Active     Discontinued 08/01/11 1252 (Error) ferrous sulfate 325 (65 FE) MG tablet [id number] No Take 325 mg by mouth daily with breakfast.  Patient not taking: Reported on 10/26/2020   [provider] Not Taking Active Self  ibuprofen (ADVIL) 800 MG tablet [id number] Yes Take 1 tablet (800 mg total) by mouth 3 (three) times daily. Garald Balding, PA-C Taking Active   lidocaine (LIDODERM) 5 % [id number] No Place 1 patch onto the skin daily. Remove & Discard patch within 12 hours or as directed by MD  Patient not taking: Reported on 10/26/2020   Garald Balding, PA-C Not Taking Active            Med Note (ROBB, MELANIE A   Wed Oct 26, 2020  9:04 AM) Needs to pick up prescription  methocarbamol (ROBAXIN) 500 MG tablet [id number] No Take 1 tablet (500 mg total) by mouth 2 (two) times daily.  Patient not taking: Reported on 10/26/2020   Isla Pence, MD Not Taking Active   methylPREDNISolone (MEDROL DOSEPAK) 4 MG TBPK tablet [id number] No Use as directed on package  Patient not taking: Reported on 10/26/2020   Wyvonnia Dusky, MD Not Taking Active   ondansetron [facility] ODT) 4 MG disintegrating tablet [id number]  No Take 1 tablet (4 mg total) by mouth every 8 (eight) hours as needed for nausea or vomiting.  Patient not taking: Reported on 10/26/2020   Layla Maw Not Taking Active            Med Note Thamas Jaegers, MELANIE A   Wed Oct 26, 2020  9:09 AM) Ran out            Patient Active Problem List   Diagnosis Date Noted   S/P appendectomy 10/26/2020   External hemorrhoids 07/19/2020   Low-grade appendiceal mucinous neoplasm  06/28/2020   Mixed conductive and sensorineural hearing loss of right ear with restricted hearing of left ear 08/10/2016   Uterine fibroids, antepartum condition or complication 35/46/5681   Ruptured ectopic pregnancy 07/05/2011   Obesity 12/21/2010    There are no care plans that you recently modified to display for this patient.   Follow up:  Patient requests no follow-up at this time.  Plan: The patient has been provided with contact information for the Managed Medicaid care management team and has been advised to call with any health related questions or concerns.  Eula Fried, BSW, MSW, [employer] Managed Medicaid [email or web address] Phone: [phone]

## 2020-11-25 ENCOUNTER — Other Ambulatory Visit: Payer: Self-pay | Admitting: *Deleted

## 2020-11-25 NOTE — Patient Outreach (Signed)
Care Coordination  11/25/2020  Melissa [name] 17-Nov-1975 [id number]   Medicaid Managed Care   Unsuccessful Outreach Note  11/25/2020 Name: Melissa [name] MRN: [id number] DOB: Oct 28, 1975  Referred by: Patient, No Pcp Per (Inactive) Reason for referral : High Risk Managed Medicaid (Unsuccessful RNCM follow up outreach)   An unsuccessful telephone outreach was attempted today. The patient was referred to the case management team for assistance with care management and care coordination.   Follow Up Plan: The care management team will reach out to the patient again over the next 14 days.   [name] RN, BSN [place]  [employer] manager

## 2020-11-25 NOTE — Patient Instructions (Addendum)
Visit Information  [name]  - as a part of your Medicaid benefit, you are eligible for care management and care coordination services at no cost or copay. I was unable to reach you by phone today but would be happy to help you with your health related needs. Please feel free to call me @ [phone].   A member of the Managed Medicaid care management team will reach out to you again over the next 7-14 days on 12/09/20 @ 11:30.   [name] RN, BSN [place]  [employer] manager

## 2020-12-09 ENCOUNTER — Other Ambulatory Visit: Payer: Self-pay | Admitting: *Deleted

## 2020-12-09 NOTE — Patient Instructions (Signed)
Visit Information  [name]  - as a part of your Medicaid benefit, you are eligible for care management and care coordination services at no cost or copay. I was unable to reach you by phone today but would be happy to help you with your health related needs. Please feel free to call me @ [phone].   A member of the Managed Medicaid care management team will reach out to you again over the next 21 days.   [name] RN, BSN [place]  [employer] manager

## 2020-12-09 NOTE — Patient Outreach (Signed)
Care Coordination  12/09/2020  Melissa [name] 1976/05/12 [id number]   Medicaid Managed Care   Unsuccessful Outreach Note  12/09/2020 Name: [name] MRN: [id number] DOB: May 04, 1976  Referred by: Patient, No Pcp Per (Inactive) Reason for referral : High Risk Managed Medicaid (Unsuccessful RNCM follow up outreach)   A second unsuccessful telephone outreach was attempted today. The patient was referred to the case management team for assistance with care management and care coordination.   Follow Up Plan: The care management team will reach out to the patient again over the next 21 days.   [name] RN, BSN [place]  [employer] manager

## 2020-12-26 ENCOUNTER — Other Ambulatory Visit: Payer: Self-pay

## 2020-12-26 ENCOUNTER — Emergency Department (HOSPITAL_BASED_OUTPATIENT_CLINIC_OR_DEPARTMENT_OTHER)
Admission: EM | Admit: 2020-12-26 | Discharge: 2020-12-26 | Disposition: A | Discharge status: Home / Self Care | Payer: Medicaid Other | Attending: Gastroenterology | Admitting: Gastroenterology

## 2020-12-26 ENCOUNTER — Encounter (HOSPITAL_BASED_OUTPATIENT_CLINIC_OR_DEPARTMENT_OTHER): Payer: Self-pay

## 2020-12-26 DIAGNOSIS — B029 Zoster without complications: Secondary | ICD-10-CM | POA: Insufficient documentation

## 2020-12-26 MED ORDER — IBUPROFEN 600 MG PO TABS: 600.0000 mg | ORAL_TABLET | Freq: Four times a day (QID) | ORAL | 0 refills | Status: AC | PRN

## 2020-12-26 MED ORDER — VALACYCLOVIR HCL 1 G PO TABS: 1000.0000 mg | ORAL_TABLET | Freq: Three times a day (TID) | ORAL | 0 refills | Status: DC

## 2020-12-26 MED ORDER — IBUPROFEN 600 MG PO TABS: 600.0000 mg | ORAL_TABLET | Freq: Four times a day (QID) | ORAL | 0 refills | Status: DC | PRN

## 2020-12-26 MED ORDER — IBUPROFEN 800 MG PO TABS
800.0000 mg | ORAL_TABLET | Freq: Once | ORAL | Status: AC
  Administered 2020-12-26: 800 mg via ORAL
  Filled 2020-12-26: qty 1

## 2020-12-26 NOTE — ED Provider Notes (Signed)
[facility] Provider Note   CSN: [id number] Arrival date & time: 12/26/20  1733     History Chief Complaint  Patient presents with   Herpes Zoster    Melissa [name] is a 45 y.o. female with a past medical history of herpes zoster presenting with a complaint of shingles on her right ear.  States that both pain and rash appeared on her right ear 2 days ago.  Has not noticed any drainage.  Says that the pain is worsening.  Denies eye pain or visual changes.  Denies changes in hearing and balance.  States that "I have been through this many times before."  Reports that valacyclovir has helped her symptoms more than acyclovir in the past.  Denies any fevers, chills, headache, nausea, no vomiting.    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   Mixed conductive and sensorineural hearing loss of right ear with restricted hearing of left ear 08/10/2016   Uterine fibroids, antepartum condition or complication 99991111   Ruptured ectopic pregnancy 07/05/2011   Obesity 12/21/2010    Past Surgical History:  Procedure Laterality Date   APPENDECTOMY     DILATION AND CURETTAGE OF UTERUS  06/2011   ectopic   EAR TUBE REMOVAL     ECTOPIC PREGNANCY SURGERY     LAPAROSCOPY  07/05/2011   Procedure: LAPAROSCOPY OPERATIVE;  Surgeon: Osborne Oman, MD;  Location: [facility];  Service: Gynecology;  Laterality: N/A;   right ear surgery     WISDOM TOOTH EXTRACTION       OB History     Gravida  5   Para  3   Term  3    Preterm  0   AB  2   Living  3      SAB  1   IAB  0   Ectopic  1   Multiple  0   Live Births  3           Family History  Problem Relation Age of Onset   Diabetes Mother    Hypertension Mother    Cancer Father    Fibroids Sister    Hypertension Sister    Crohn's disease Brother    Cancer Brother    Diabetes Brother    Hypertension Brother    Kidney disease Brother    Breast cancer Maternal Aunt    Breast cancer Paternal Aunt    Anesthesia problems Neg Hx     Social History   Tobacco Use   Smoking status: Never   Smokeless tobacco: Never  Vaping Use   Vaping Use: Never used  Substance Use Topics   Alcohol use: No   Drug use: No    Home Medications Prior to Admission medications   Medication Sig Start Date End Date Taking? Authorizing Provider  cyclobenzaprine (FLEXERIL) 10 MG tablet Take 1 tablet (10 mg total) by mouth 3 (three) times daily as  needed for muscle spasms. 08/18/20   Sherwood Gambler, MD  dicyclomine (BENTYL) 20 MG tablet Take 1 tablet (20 mg total) by mouth 2 (two) times daily. Patient not taking: Reported on 10/26/2020 05/12/20   Lorayne Bender, PA-C  ferrous sulfate 325 (65 FE) MG tablet Take 325 mg by mouth daily with breakfast. Patient not taking: Reported on 10/26/2020    [provider]  ibuprofen (ADVIL) 800 MG tablet Take 1 tablet (800 mg total) by mouth 3 (three) times daily. 10/15/20   Garald Balding, PA-C  lidocaine (LIDODERM) 5 % Place 1 patch onto the skin daily. Remove & Discard patch within 12 hours or as directed by MD Patient not taking: Reported on 10/26/2020 10/15/20   Garald Balding, PA-C  methocarbamol (ROBAXIN) 500 MG tablet Take 1 tablet (500 mg total) by mouth 2 (two) times daily. Patient not taking: Reported on 10/26/2020 09/30/20   Isla Pence, MD  methylPREDNISolone (MEDROL DOSEPAK) 4 MG TBPK tablet Use as directed on package Patient not taking: Reported on 10/26/2020 03/27/20   Wyvonnia Dusky, MD   ondansetron (ZOFRAN ODT) 4 MG disintegrating tablet Take 1 tablet (4 mg total) by mouth every 8 (eight) hours as needed for nausea or vomiting. Patient not taking: Reported on 10/26/2020 05/12/20   Lorayne Bender, PA-C  diphenhydrAMINE (BENADRYL) 12.5 MG/5ML liquid Take 12.5 mg by mouth once.  08/01/11  [provider]    Allergies    Philippa Sicks scolymus (artichoke)]  Review of Systems   Review of Systems  Constitutional:  Negative for chills, fatigue and fever.  HENT:  Positive for ear pain. Negative for congestion, facial swelling, hearing loss, sinus pressure, sinus pain, sneezing, sore throat, tinnitus and trouble swallowing.   Eyes:  Negative for photophobia, discharge, redness, itching and visual disturbance.  Respiratory:  Negative for cough, chest tightness and shortness of breath.   Cardiovascular:  Negative for chest pain and palpitations.  Gastrointestinal:  Negative for nausea and vomiting.  Neurological:  Negative for dizziness, light-headedness, numbness and headaches.  Psychiatric/Behavioral:  Negative for confusion.   All other systems reviewed and are negative.  Physical Exam Updated Vital Signs BP 119/81 (BP Location: Left Arm)   Pulse 76   Temp 98.4 F (36.9 C) (Oral)   Resp 18   Ht '5\' 4"'$  (1.626 m)   Wt 114.8 kg   LMP 12/14/2020   SpO2 100%   BMI 43.43 kg/m   Physical Exam Vitals and nursing note reviewed.  Constitutional:      Appearance: Normal appearance.  HENT:     Head: Normocephalic and atraumatic.     Right Ear: Tympanic membrane and ear canal normal.     Left Ear: Tympanic membrane, ear canal and external ear normal.     Ears:     Comments: Right ear with noticeable vesicles near the auricle.  Associated erythema.  Patient able to tolerate pain enough for me to visualize the inner ear.  No abnormalities.  TM intact.    Nose: Nose normal.     Mouth/Throat:     Mouth: Mucous membranes are moist.     Pharynx: Oropharynx is clear.   Eyes:     General: No scleral icterus.    Conjunctiva/sclera: Conjunctivae normal.     Pupils: Pupils are equal, round, and reactive to light.  Cardiovascular:     Rate and Rhythm: Normal rate and regular rhythm.     Heart sounds: No murmur heard. Pulmonary:  Effort: Pulmonary effort is normal. No respiratory distress.     Breath sounds: Normal breath sounds.  Musculoskeletal:     Cervical back: Normal range of motion.  Lymphadenopathy:     Cervical: No cervical adenopathy.  Skin:    General: Skin is warm and dry.     Findings: Lesion (2 intact vesicles on patient's right ear.  No draining.) present.  Neurological:     Mental Status: She is alert.  Psychiatric:        Mood and Affect: Mood normal.        Behavior: Behavior normal.    ED Results / Procedures / Treatments   Labs (all labs ordered are listed, but only abnormal results are displayed) Labs Reviewed - No data to display  EKG None  Radiology No results found.  Procedures Procedures    Medications Ordered in ED Medications  ibuprofen (ADVIL) tablet 800 mg (800 mg Oral Given 12/26/20 1927)    ED Course  I have reviewed the triage vital signs and the nursing notes.  Pertinent labs & imaging results that were available during my care of the patient were reviewed by me and considered in my medical decision making (see chart for details).    MDM Rules/Calculators/A&P                         Patient is a 45 year old female with a past medical history of multiple shingles outbreaks.  States that this outbreak seem to have begun 2 days ago.  She endorsed intense pain but denied any changes in her vision, hearing or headaches.    On my physical exam I noted vesicles and erythema to the right ear.  The vesicles were intact.  There were no signs of other areas of vesicles on her body. No eye involvement.Patient denied any trouble with her hearing and balance. No difficulty chewing or moving her face. My  suspicion for Ramsay Hunt is low.   Patient discharged with Valtrex and return precautions. Voiced understanding.  Final Clinical Impression(s) / ED Diagnoses Final diagnoses:  Herpes zoster without complication    Rx / DC Orders Results and diagnoses were explained to the patient. Return precautions discussed in full. Patient had no additional questions and expressed complete understanding.    Rhae Hammock, PA-C 12/29/20 1505    Fredia Sorrow, MD 12/30/20 2317

## 2020-12-26 NOTE — ED Triage Notes (Signed)
Pt c/o "shingles" to right ear-hx of same-sx started yesterday-NAD-steady gait

## 2020-12-27 ENCOUNTER — Telehealth: Payer: Self-pay

## 2020-12-27 NOTE — Telephone Encounter (Signed)
Transition Care Management Unsuccessful Follow-up Telephone Call  Date of discharge and from where:  12/26/2020[facility]  Attempts:  1st Attempt  Reason for unsuccessful TCM follow-up call:  Left voice message

## 2020-12-28 NOTE — Telephone Encounter (Signed)
Transition Care Management Unsuccessful Follow-up Telephone Call  Date of discharge and from where:  [facility]   Attempts:  2nd Attempt  Reason for unsuccessful TCM follow-up call:  Left voice message

## 2020-12-29 NOTE — Telephone Encounter (Signed)
Transition Care Management Unsuccessful Follow-up Telephone Call  Date of discharge and from where:  [facility]   Attempts:  3rd Attempt  Reason for unsuccessful TCM follow-up call:  Left voice message

## 2021-01-02 ENCOUNTER — Other Ambulatory Visit: Payer: Self-pay | Admitting: *Deleted

## 2021-01-02 NOTE — Patient Outreach (Addendum)
Care Coordination  01/02/2021  Melissa [name] 1976/05/05 [id number]   Medicaid Managed Care   Unsuccessful Outreach Note  01/02/2021 Name: [name] MRN: [id number] DOB: 17-Mar-1976  Referred by: Patient, No Pcp Per (Inactive) Reason for referral : High Risk Managed Medicaid (Unsuccessful RNCM follow up outreach, 3rd attempt)   Third unsuccessful telephone outreach was attempted today. The patient was referred to the case management team for assistance with care management and care coordination. The patient's primary care provider has been notified of our unsuccessful attempts to make or maintain contact with the patient. The care management team is pleased to engage with this patient at any time in the future should he/she be interested in assistance from the care management team.   Follow Up Plan: We have been unable to make contact with the patient for follow up. The care management team is available to follow up with the patient after provider conversation with the patient regarding recommendation for care management engagement and subsequent re-referral to the care management team.   [name] RN, BSN [place] RN Care Coordinator

## 2021-03-01 ENCOUNTER — Ambulatory Visit: Payer: Medicaid Other | Admitting: Family Medicine

## 2021-03-02 ENCOUNTER — Emergency Department (HOSPITAL_BASED_OUTPATIENT_CLINIC_OR_DEPARTMENT_OTHER)
Admission: EM | Admit: 2021-03-02 | Discharge: 2021-03-02 | Disposition: A | Discharge status: Home / Self Care | Payer: Medicaid Other | Attending: Emergency Medicine | Admitting: Emergency Medicine

## 2021-03-02 ENCOUNTER — Encounter (HOSPITAL_BASED_OUTPATIENT_CLINIC_OR_DEPARTMENT_OTHER): Payer: Self-pay

## 2021-03-02 ENCOUNTER — Other Ambulatory Visit: Payer: Self-pay

## 2021-03-02 DIAGNOSIS — R202 Paresthesia of skin: Secondary | ICD-10-CM | POA: Diagnosis not present

## 2021-03-02 DIAGNOSIS — W010XXA Fall on same level from slipping, tripping and stumbling without subsequent striking against object, initial encounter: Secondary | ICD-10-CM | POA: Diagnosis not present

## 2021-03-02 DIAGNOSIS — M25551 Pain in right hip: Secondary | ICD-10-CM | POA: Insufficient documentation

## 2021-03-02 DIAGNOSIS — M5441 Lumbago with sciatica, right side: Secondary | ICD-10-CM | POA: Insufficient documentation

## 2021-03-02 DIAGNOSIS — M545 Low back pain, unspecified: Secondary | ICD-10-CM | POA: Diagnosis present

## 2021-03-02 LAB — PREGNANCY, URINE: Preg Test, Ur: NEGATIVE

## 2021-03-02 MED ORDER — PREDNISONE 10 MG PO TABS: 40.0000 mg | ORAL_TABLET | Freq: Every day | ORAL | 0 refills | Status: AC

## 2021-03-02 MED ORDER — KETOROLAC TROMETHAMINE 15 MG/ML IJ SOLN
15.0000 mg | Freq: Once | INTRAMUSCULAR | Status: AC
  Administered 2021-03-02: 15 mg via INTRAMUSCULAR
  Filled 2021-03-02: qty 1

## 2021-03-02 MED ORDER — NAPROXEN 500 MG PO TABS: 500.0000 mg | ORAL_TABLET | Freq: Two times a day (BID) | ORAL | 0 refills | Status: DC | PRN

## 2021-03-02 MED ORDER — CYCLOBENZAPRINE HCL 10 MG PO TABS: 10.0000 mg | ORAL_TABLET | Freq: Every evening | ORAL | 0 refills | Status: DC | PRN

## 2021-03-02 NOTE — ED Triage Notes (Signed)
Pt states she fell 2 days ago-pain to right lower back/hip area/left LE-NAD-to triage in w/c

## 2021-03-02 NOTE — ED Notes (Signed)
No acute distress noted upon this RN's departure of patient. Verified discharge paperwork with name and DOB. Vital signs stable. Patient taken to checkout window. Discharge paperwork discussed with patient. No further questions voiced upon discharge.

## 2021-03-02 NOTE — Discharge Instructions (Signed)
I am prescribing you a strong steroid medication called prednisone.  Please only take this as prescribed.  Please take it early in the morning, as this medication can be stimulating and make it difficult to sleep at night.  I am prescribing you a strong muscle relaxer called flexeril. Please only take this medication once in the evening with dinner. This medication can make you quite drowsy. Do not mix it with alcohol. Do not drive a vehicle after taking it.   I am prescribing you an anti-inflammatory medication called naproxen.  This is similar to Aleve.  You can take this up to 2 times a day for management of your pain.  Try to take it with a small amount of food to help prevent stomach irritation.  Below is the contact information for Dr. Raeford Razor.  He is a local sports medicine doctor that is located in the same building at this emergency department.  Please give them a call and 1 week if your symptoms have not improved.  If you develop any new or worsening symptoms please do not hesitate to return to the emergency department. It was a pleasure to meet you.

## 2021-03-02 NOTE — ED Provider Notes (Signed)
[facility] Provider Note   CSN: [id number] Arrival date & time: 03/02/21  1828     History Chief Complaint  Patient presents with   [name]    Melissa [name] is a 45 y.o. female.  HPI Patient is a 45 year old female with a medical history as noted below.  Melissa [name] presents to the emergency department due to right low back pain.  Patient states 2 days ago Melissa [name] was going down a set of stairs, slipped, and caught her self and began experiencing pain along the muscles of the right low back and buttock.  Pain worsens with movement as well as ambulation.  Reports associated tingling intermittently in the right foot.  Denies any numbness, weakness, saddle anesthesia, bowel or bladder incontinence.    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   Mixed conductive and sensorineural hearing loss of right ear with restricted hearing of left ear 08/10/2016   Uterine fibroids, antepartum condition or complication 14/78/2956   Ruptured ectopic pregnancy 07/05/2011   Obesity 12/21/2010    Past Surgical History:  Procedure Laterality Date   APPENDECTOMY     DILATION AND CURETTAGE OF UTERUS  06/2011   ectopic   EAR TUBE REMOVAL     ECTOPIC PREGNANCY SURGERY     LAPAROSCOPY  07/05/2011   Procedure: LAPAROSCOPY OPERATIVE;  Surgeon: Osborne Oman, MD;  Location: [facility];  Service: Gynecology;  Laterality: N/A;   right ear surgery     WISDOM TOOTH EXTRACTION       OB History     Gravida  5   Para  3   Term  3    Preterm  0   AB  2   Living  3      SAB  1   IAB  0   Ectopic  1   Multiple  0   Live Births  3           Family History  Problem Relation Age of Onset   Diabetes Mother    Hypertension Mother    Cancer Father    Fibroids Sister    Hypertension Sister    Crohn's disease Brother    Cancer Brother    Diabetes Brother    Hypertension Brother    Kidney disease Brother    Breast cancer Maternal Aunt    Breast cancer Paternal Aunt    Anesthesia problems Neg Hx     Social History   Tobacco Use   Smoking status: Never   Smokeless tobacco: Never  Vaping Use   Vaping Use: Never used  Substance Use Topics   Alcohol use: No   Drug use: No    Home Medications Prior to Admission medications   Medication Sig Start Date End Date Taking? Authorizing Provider  cyclobenzaprine (FLEXERIL) 10 MG tablet Take 1 tablet (10 mg total) by mouth at bedtime as needed for muscle spasms. 03/02/21  Yes Carletha Dawn,  Phelan Schadt, PA-C  naproxen (NAPROSYN) 500 MG tablet Take 1 tablet (500 mg total) by mouth 2 (two) times daily as needed. 03/02/21  Yes Rayna Sexton, PA-C  predniSONE (DELTASONE) 10 MG tablet Take 4 tablets (40 mg total) by mouth daily with breakfast for 5 days. 03/02/21 03/07/21 Yes Rayna Sexton, PA-C  dicyclomine (BENTYL) 20 MG tablet Take 1 tablet (20 mg total) by mouth 2 (two) times daily. Patient not taking: Reported on 10/26/2020 05/12/20   Lorayne Bender, PA-C  ferrous sulfate 325 (65 FE) MG tablet Take 325 mg by mouth daily with breakfast. Patient not taking: Reported on 10/26/2020    [provider]  ibuprofen (ADVIL) 800 MG tablet Take 1 tablet (800 mg total) by mouth 3 (three) times daily. 10/15/20   Garald Balding, PA-C  lidocaine (LIDODERM) 5 % Place 1 patch onto the skin daily. Remove & Discard patch within 12 hours or as directed by MD Patient not taking: Reported on 10/26/2020 10/15/20   Garald Balding, PA-C  methocarbamol (ROBAXIN) 500 MG tablet Take 1  tablet (500 mg total) by mouth 2 (two) times daily. Patient not taking: Reported on 10/26/2020 09/30/20   Isla Pence, MD  ondansetron (ZOFRAN ODT) 4 MG disintegrating tablet Take 1 tablet (4 mg total) by mouth every 8 (eight) hours as needed for nausea or vomiting. Patient not taking: Reported on 10/26/2020 05/12/20   Joy, Helane Gunther, PA-C  valACYclovir (VALTREX) 1000 MG tablet Take 1 tablet (1,000 mg total) by mouth 3 (three) times daily. 12/26/20   Redwine, Madison A, PA-C  diphenhydrAMINE (BENADRYL) 12.5 MG/5ML liquid Take 12.5 mg by mouth once.  08/01/11  [provider]    Allergies    Philippa Sicks scolymus (artichoke)]  Review of Systems   Review of Systems  Constitutional:  Negative for fever.  Musculoskeletal:  Positive for back pain and myalgias.  Neurological:  Negative for weakness and numbness.   Physical Exam Updated Vital Signs BP 128/76   Pulse (!) 59   Temp 98.1 F (36.7 C) (Oral)   Resp 18   Ht 5\' 4"  (1.626 m)   Wt 109.8 kg   LMP 02/08/2021   SpO2 100%   BMI 41.54 kg/m   Physical Exam Vitals and nursing note reviewed.  Constitutional:      General: Melissa [name] is not in acute distress.    Appearance: Melissa [name] is well-developed.  HENT:     Head: Normocephalic and atraumatic.     Right Ear: External ear normal.     Left Ear: External ear normal.  Eyes:     General: No scleral icterus.       Right eye: No discharge.        Left eye: No discharge.     Conjunctiva/sclera: Conjunctivae normal.  Neck:     Trachea: No tracheal deviation.  Cardiovascular:     Rate and Rhythm: Normal rate.  Pulmonary:     Effort: Pulmonary effort is normal. No respiratory distress.     Breath sounds: No stridor.  Abdominal:     General: There is no distension.  Musculoskeletal:        General: Tenderness present. No swelling or deformity.     Cervical back: Neck supple.     Comments: Moderate TTP noted along the right lumbar musculature as well as the musculature of the  right buttock.  No tenderness appreciated along the midline lumbar spine or right lateral hip.  Positive straight leg raise on the right.  Negative contralateral straight leg raise.  Distal sensation intact.  2+ pedal pulses.  Skin:    General: Skin is warm and dry.     Findings: No rash.  Neurological:     Mental Status: Melissa [name] is alert.     Cranial Nerves: Cranial nerve deficit: no gross deficits.   ED Results / Procedures / Treatments   Labs (all labs ordered are listed, but only abnormal results are displayed) Labs Reviewed  PREGNANCY, URINE    EKG None  Radiology No results found.  Procedures Procedures   Medications Ordered in ED Medications  ketorolac (TORADOL) 15 MG/ML injection 15 mg (15 mg Intramuscular Given 03/02/21 2047)    ED Course  I have reviewed the triage vital signs and the nursing notes.  Pertinent labs & imaging results that were available during my care of the patient were reviewed by me and considered in my medical decision making (see chart for details).    MDM Rules/Calculators/A&P                          Patient is a 45 year old female who presents to the emergency department with symptoms consistent with right-sided sciatica.  Symptoms began 2 days ago after catching herself during a near fall on a set of stairs.  Physical exam significant for tenderness along the right lumbar musculature as well as the musculature of the right buttock.  Positive straight leg raise.  No bony tenderness in the midline lumbar spine or right hip.  Neurovascularly intact in the bilateral lower extremities.  Patient's pain treated with Toradol in the ED.  Will discharge on a course of prednisone, naproxen, as well as Flexeril.  We discussed return precautions.  Patient given a referral to sports medicine if Melissa [name] finds that her symptoms are refractory.  Feel that Melissa [name] is stable for discharge at this time and Melissa [name] is agreeable.  Her questions were answered and Melissa [name] was  amicable at the time of discharge.  Final Clinical Impression(s) / ED Diagnoses Final diagnoses:  Acute right-sided low back pain with right-sided sciatica    Rx / DC Orders ED Discharge Orders          Ordered    predniSONE (DELTASONE) 10 MG tablet  Daily with breakfast        03/02/21 2043    naproxen (NAPROSYN) 500 MG tablet  2 times daily PRN        03/02/21 2043    cyclobenzaprine (FLEXERIL) 10 MG tablet  At bedtime PRN        03/02/21 2043             Rayna Sexton, PA-C 03/02/21 2101    Davonna Belling, MD 03/03/21 [phone]

## 2021-03-03 ENCOUNTER — Telehealth: Payer: Self-pay

## 2021-03-03 NOTE — Telephone Encounter (Signed)
Transition Care Management Unsuccessful Follow-up Telephone Call  Date of discharge and from where:  03/02/2021-[facility]  Attempts:  1st Attempt  Reason for unsuccessful TCM follow-up call:  Voice mail full

## 2021-03-04 NOTE — Telephone Encounter (Signed)
Transition Care Management Unsuccessful Follow-up Telephone Call  Date of discharge and from where:  03/02/2021 from [facility]  Attempts:  2nd Attempt  Reason for unsuccessful TCM follow-up call:  Voice mail full

## 2021-03-06 NOTE — Telephone Encounter (Signed)
Transition Care Management Unsuccessful Follow-up Telephone Call  Date of discharge and from where:  03/02/2021 from [facility]  Attempts:  3rd Attempt  Reason for unsuccessful TCM follow-up call:  Unable to reach patient

## 2021-03-21 ENCOUNTER — Ambulatory Visit (INDEPENDENT_AMBULATORY_CARE_PROVIDER_SITE_OTHER): Payer: Medicaid Other | Admitting: Family Medicine

## 2021-03-21 ENCOUNTER — Encounter: Payer: Self-pay | Admitting: Family Medicine

## 2021-03-21 ENCOUNTER — Other Ambulatory Visit: Payer: Self-pay

## 2021-03-21 VITALS — BP 124/76 | HR 69 | Ht 64.0 in | Wt 246.8 lb

## 2021-03-21 DIAGNOSIS — Z3189 Encounter for other procreative management: Secondary | ICD-10-CM | POA: Diagnosis not present

## 2021-03-21 MED ORDER — ASPIRIN EC 81 MG PO TBEC: 81.0000 mg | DELAYED_RELEASE_TABLET | Freq: Every day | ORAL | 11 refills | Status: DC

## 2021-03-21 NOTE — Assessment & Plan Note (Signed)
Based on history patient is very likely ovulating given regular cycle with moliminal symptoms. Recommended she start to use a fertility tracking app along with adjuncts like basal temperature monitoring, cervical mucous checks, and appropriately timed ovulation predictor kits to confirm she is ovulatory. Discussed that at her age not considered to have infertility until 6 months and despite R sided salpingectomy from ectopic she has achieved pregnancy twice before. We did discuss that genetic problems leading to early pregnancy loss are more common with AMA but for time being would continue intercourse every other day, fertility tracking to identify fertile window reliably, and RTC in 3 months for re-eval and possible referral to fertility clinic if not successful. Asked about metformin/clomid but we agreed to wait until she had tried the above.

## 2021-03-21 NOTE — Progress Notes (Signed)
GYNECOLOGY OFFICE VISIT NOTE  History:   Melissa [name] is a 45 y.o. [phone] here today for fertility discussion.  Patient is up to date on pap and mammogram Has new partner who wants to have a baby Partner has a three year old daughter Has regular period every month Has feeling of fatigue, cramps, other moliminal signs when she has her period In 2012 had a single episode of chlamydia that was treated Has been having unprotected sex since 12/2020 Had ruptured ectopic with R salpingectomy on 07/05/2011 Subsequently had two normal vaginal deliveries in 2014 and 2015 Taking prenatals regularly   Health Maintenance Due  Topic Date Due   COVID-19 Vaccine (1) Never done   INFLUENZA VACCINE  Never done    Past Medical History:  Diagnosis Date   Abnormal Pap smear 08/30/2008   LGSIL   Allergy    BV (bacterial vaginosis)    recurring   Chlamydia    Chronic pain of left knee    Ectopic pregnancy    Fibroid    Infertility    Secondary PCOS   PCOS (polycystic ovarian syndrome)    Shingles outbreak 11/2011   Vertigo     Past Surgical History:  Procedure Laterality Date   APPENDECTOMY     DILATION AND CURETTAGE OF UTERUS  06/2011   ectopic   EAR TUBE REMOVAL     ECTOPIC PREGNANCY SURGERY     LAPAROSCOPY  07/05/2011   Procedure: LAPAROSCOPY OPERATIVE;  Surgeon: Osborne Oman, MD;  Location: [facility];  Service: Gynecology;  Laterality: N/A;   right ear surgery     WISDOM TOOTH EXTRACTION      The following portions of the patient's history were reviewed and updated as appropriate: allergies, current medications, past family history, past medical history, past social history, past surgical history and problem list.   Health Maintenance:   Last pap: Lab Results  Component Value Date   DIAGPAP  03/16/2019    - Negative for intraepithelial lesion or malignancy (NILM)   [place] Negative 03/16/2019     Last mammogram:  06/20/2020 - BIRADS 1   Review of Systems:   Pertinent items noted in HPI and remainder of comprehensive ROS otherwise negative.  Physical Exam:  BP 124/76 (BP Location: Left Arm, Patient Position: Sitting, Cuff Size: Normal)   Pulse 69   Ht 5\' 4"  (1.626 m)   Wt 246 lb 12.8 oz (111.9 kg)   LMP 03/04/2021 (Exact Date)   BMI 42.36 kg/m  CONSTITUTIONAL: Well-developed, well-nourished female in no acute distress.  HEENT:  Normocephalic, atraumatic. External right and left ear normal. No scleral icterus.  NECK: Normal range of motion, supple, no masses noted on observation SKIN: No rash noted. Not diaphoretic. No erythema. No pallor. MUSCULOSKELETAL: Normal range of motion. No edema noted. NEUROLOGIC: Alert and oriented to person, place, and time. Normal muscle tone coordination.  PSYCHIATRIC: Normal mood and affect. Normal behavior. Normal judgment and thought content. RESPIRATORY: Effort normal, no problems with respiration noted  Labs and Imaging No results found for this or any previous visit (from the past 168 hour(s)). No results found.    Assessment and Plan:   Problem List Items Addressed This Visit       Other   Encounter for fertility planning - Primary    Based on history patient is very likely ovulating given regular cycle with moliminal symptoms. Recommended she start to use a fertility tracking app along with adjuncts like basal temperature monitoring,  cervical mucous checks, and appropriately timed ovulation predictor kits to confirm she is ovulatory. Discussed that at her age not considered to have infertility until 6 months and despite R sided salpingectomy from ectopic she has achieved pregnancy twice before. We did discuss that genetic problems leading to early pregnancy loss are more common with AMA but for time being would continue intercourse every other day, fertility tracking to identify fertile window reliably, and RTC in 3 months for re-eval and possible referral to fertility clinic if not successful. Asked  about metformin/clomid but we agreed to wait until she had tried the above.       Relevant Medications   aspirin EC 81 MG tablet    Routine preventative health maintenance measures emphasized. Please refer to After Visit Summary for other counseling recommendations.   Return in about 3 months (around 06/21/2021) for fertility follow up.    Total face-to-face time with patient: 30 minutes.  Over 50% of encounter was spent on counseling and coordination of care.   Clarnce Flock, MD/MPH Attending Family Medicine Physician, [facility] for [facility], [place]

## 2021-05-23 ENCOUNTER — Other Ambulatory Visit: Payer: Self-pay

## 2021-05-23 ENCOUNTER — Encounter (HOSPITAL_BASED_OUTPATIENT_CLINIC_OR_DEPARTMENT_OTHER): Payer: Self-pay | Admitting: *Deleted

## 2021-05-23 ENCOUNTER — Emergency Department (HOSPITAL_BASED_OUTPATIENT_CLINIC_OR_DEPARTMENT_OTHER)
Admission: EM | Admit: 2021-05-23 | Discharge: 2021-05-23 | Disposition: A | Discharge status: Home / Self Care | Payer: Medicaid Other | Attending: Emergency Medicine | Admitting: Emergency Medicine

## 2021-05-23 DIAGNOSIS — R519 Headache, unspecified: Secondary | ICD-10-CM | POA: Insufficient documentation

## 2021-05-23 DIAGNOSIS — R110 Nausea: Secondary | ICD-10-CM | POA: Insufficient documentation

## 2021-05-23 DIAGNOSIS — Z7982 Long term (current) use of aspirin: Secondary | ICD-10-CM | POA: Diagnosis not present

## 2021-05-23 DIAGNOSIS — H9201 Otalgia, right ear: Secondary | ICD-10-CM

## 2021-05-23 MED ORDER — ONDANSETRON 4 MG PO TBDP: 4.0000 mg | ORAL_TABLET | Freq: Three times a day (TID) | ORAL | 0 refills | Status: DC | PRN

## 2021-05-23 MED ORDER — VALACYCLOVIR HCL 1 G PO TABS: 1000.0000 mg | ORAL_TABLET | Freq: Three times a day (TID) | ORAL | 0 refills | Status: DC

## 2021-05-23 NOTE — ED Provider Notes (Signed)
[facility] EMERGENCY DEPARTMENT Provider Note   CSN: [id number] Arrival date & time: 05/23/21  1307     History  Chief Complaint  Patient presents with   Otalgia    Melissa [name] is a 46 y.o. female.  Patient with history of frequent shingles flares presents to the emergency department for right ear pain.  Patient states that she developed a headache 2 days ago with nausea.  No vision loss, weakness, numbness, or tingling in the arms or legs.  No confusion or neck pain.  She took over-the-counter medication which did not help her headache.  At the same time she developed pain in her right ear canal.  This is typical for flares of shingles.  She describes a burning sensation in her left ear as well as swelling in front of her ear and below her ear.  No fevers.  No URI symptoms.      Home Medications Prior to Admission medications   Medication Sig Start Date End Date Taking? Authorizing Provider  ondansetron (ZOFRAN-ODT) 4 MG disintegrating tablet Take 1 tablet (4 mg total) by mouth every 8 (eight) hours as needed for nausea or vomiting. 05/23/21  Yes Carlisle Cater, PA-C  valACYclovir (VALTREX) 1000 MG tablet Take 1 tablet (1,000 mg total) by mouth 3 (three) times daily. 05/23/21  Yes Carlisle Cater, PA-C  aspirin EC 81 MG tablet Take 1 tablet (81 mg total) by mouth daily. Swallow whole. 03/21/21   Clarnce Flock, MD  cyclobenzaprine (FLEXERIL) 10 MG tablet Take 1 tablet (10 mg total) by mouth at bedtime as needed for muscle spasms. 03/02/21   Rayna Sexton, PA-C  ibuprofen (ADVIL) 800 MG tablet Take 1 tablet (800 mg total) by mouth 3 (three) times daily. 10/15/20   Garald Balding, PA-C  naproxen (NAPROSYN) 500 MG tablet Take 1 tablet (500 mg total) by mouth 2 (two) times daily as needed. 03/02/21   Rayna Sexton, PA-C  diphenhydrAMINE (BENADRYL) 12.5 MG/5ML liquid Take 12.5 mg by mouth once.  08/01/11  [provider]      Allergies    Philippa Sicks  scolymus (artichoke)]    Review of Systems   Review of Systems  Constitutional:  Negative for chills, fatigue and fever.  HENT:  Positive for ear pain and facial swelling. Negative for congestion, ear discharge, rhinorrhea, sinus pressure and sore throat.   Eyes:  Negative for redness.  Respiratory:  Negative for cough and wheezing.   Gastrointestinal:  Positive for nausea. Negative for abdominal pain, diarrhea and vomiting.  Genitourinary:  Negative for dysuria.  Musculoskeletal:  Negative for myalgias and neck stiffness.  Skin:  Negative for rash.  Neurological:  Positive for headaches.  Hematological:  Positive for adenopathy.   Physical Exam Updated Vital Signs BP (!) 143/78 (BP Location: Right Arm)    Pulse 73    Temp 99.2 F (37.3 C) (Oral)    Resp 18    Ht 5\' 4"  (1.626 m)    Wt 111.9 kg    LMP 05/23/2021    SpO2 100%    BMI 42.35 kg/m  Physical Exam Vitals and nursing note reviewed.  Constitutional:      Appearance: She is well-developed.  HENT:     Head: Normocephalic and atraumatic.     Jaw: No trismus.     Right Ear: Tympanic membrane normal. Swelling and tenderness present. Tympanic membrane is not erythematous, retracted or bulging.     Left Ear: Tympanic membrane, ear canal and external  ear normal. Tympanic membrane is not erythematous, retracted or bulging.     Ears:     Comments: Patient with scaling noted to the outer ear canal and generalized edema of the ear canal.    Nose: Nose normal. No mucosal edema or rhinorrhea.     Mouth/Throat:     Mouth: Mucous membranes are moist. Mucous membranes are not dry. No oral lesions.     Pharynx: Uvula midline. No oropharyngeal exudate, posterior oropharyngeal erythema or uvula swelling.     Tonsils: No tonsillar abscesses.  Eyes:     General:        Right eye: No discharge.        Left eye: No discharge.     Conjunctiva/sclera: Conjunctivae normal.  Cardiovascular:     Rate and Rhythm: Normal rate and regular rhythm.      Heart sounds: Normal heart sounds.  Pulmonary:     Effort: Pulmonary effort is normal. No respiratory distress.     Breath sounds: Normal breath sounds. No wheezing or rales.  Abdominal:     Palpations: Abdomen is soft.     Tenderness: There is no abdominal tenderness.  Musculoskeletal:     Cervical back: Normal range of motion and neck supple.  Lymphadenopathy:     Head:     Right side of head: Tonsillar and preauricular adenopathy present. No submandibular, posterior auricular or occipital adenopathy.     Cervical: No cervical adenopathy.  Skin:    General: Skin is warm and dry.  Neurological:     Mental Status: She is alert.  Psychiatric:        Mood and Affect: Mood normal.    ED Results / Procedures / Treatments   Labs (all labs ordered are listed, but only abnormal results are displayed) Labs Reviewed - No data to display  EKG None  Radiology No results found.  Procedures Procedures    Medications Ordered in ED Medications - No data to display  ED Course/ Medical Decision Making/ A&P    Patient seen and examined. Plan discussed with patient.   Labs: None  Imaging: None  Medications/Fluids: Prescription for oral valacyclovir and ODT Zofran  Vital signs reviewed and are as follows: BP (!) 143/78 (BP Location: Right Arm)    Pulse 73    Temp 99.2 F (37.3 C) (Oral)    Resp 18    Ht 5\' 4"  (1.626 m)    Wt 111.9 kg    LMP 05/23/2021    SpO2 100%    BMI 42.35 kg/m   Initial impression: Ear pain, likely secondary to shingles flare per history  Patient urged to return with worsening symptoms or other concerns. Patient verbalized understanding and agrees with plan.                            Medical Decision Making  Patient here with right ear pain, history of shingles flares.  Also with some apparent reactive lymphadenopathy causing swelling around the ear.  Symptoms have improved in the past with Valtrex.  She currently does not have anyone managing this  for her.  Encouraged follow-up with PCP.  No signs of cellulitis.  No focal neurologic deficits.  No concern for intercerebral hemorrhage.  Do not feel patient requires CT of the head or neck at this point.        Final Clinical Impression(s) / ED Diagnoses Final diagnoses:  Right ear pain  Rx / DC Orders ED Discharge Orders          Ordered    valACYclovir (VALTREX) 1000 MG tablet  3 times daily        05/23/21 1415    ondansetron (ZOFRAN-ODT) 4 MG disintegrating tablet  Every 8 hours PRN        05/23/21 1416              Carlisle Cater, PA-C 05/23/21 1421    [place], [place], DO 05/23/21 1428

## 2021-05-23 NOTE — ED Notes (Signed)
D/c paperwork reviewed with pt, including prescriptions.  No questions or concerns at time of d/c. Ambulatory to ED exit, NAD.

## 2021-05-23 NOTE — Discharge Instructions (Addendum)
Please read and follow all provided instructions.  Your diagnoses today include:  1. Right ear pain     Tests performed today include: Vital signs. See below for your results today.   Medications prescribed:  Valtrex: Medication for shingles flare  Zofran (ondansetron) - for nausea and vomiting  Take any prescribed medications only as directed.  Home care instructions:  Follow any educational materials contained in this packet.  BE VERY CAREFUL not to take multiple medicines containing Tylenol (also called acetaminophen). Doing so can lead to an overdose which can damage your liver and cause liver failure and possibly death.   Follow-up instructions: Please follow-up with your primary care provider in the next 3 days for further evaluation of your symptoms.   Return instructions:  Please return to the Emergency Department if you experience worsening symptoms.  Return if you have severe headache with vision loss, weakness in your arms or legs, slurred speech, trouble walking or talking, confusion, or trouble with your balance.  Please return if you have any other emergent concerns.  Additional Information:  Your vital signs today were: BP (!) 143/78 (BP Location: Right Arm)    Pulse 73    Temp 99.2 F (37.3 C) (Oral)    Resp 18    Ht 5\' 4"  (1.626 m)    Wt 111.9 kg    LMP 05/23/2021    SpO2 100%    BMI 42.35 kg/m  If your blood pressure (BP) was elevated above 135/85 this visit, please have this repeated by your doctor within one month. --------------

## 2021-05-23 NOTE — ED Triage Notes (Signed)
Right ear pain x2 days.

## 2021-05-24 ENCOUNTER — Telehealth (INDEPENDENT_AMBULATORY_CARE_PROVIDER_SITE_OTHER): Payer: Medicaid Other | Admitting: Family Medicine

## 2021-05-24 ENCOUNTER — Encounter: Payer: Self-pay | Admitting: Family Medicine

## 2021-05-24 ENCOUNTER — Telehealth: Payer: Self-pay

## 2021-05-24 DIAGNOSIS — O3410 Maternal care for benign tumor of corpus uteri, unspecified trimester: Secondary | ICD-10-CM

## 2021-05-24 DIAGNOSIS — Z9189 Other specified personal risk factors, not elsewhere classified: Secondary | ICD-10-CM

## 2021-05-24 DIAGNOSIS — O009 Unspecified ectopic pregnancy without intrauterine pregnancy: Secondary | ICD-10-CM

## 2021-05-24 DIAGNOSIS — Z1231 Encounter for screening mammogram for malignant neoplasm of breast: Secondary | ICD-10-CM | POA: Diagnosis not present

## 2021-05-24 DIAGNOSIS — Z3189 Encounter for other procreative management: Secondary | ICD-10-CM

## 2021-05-24 MED ORDER — METFORMIN HCL 500 MG PO TABS: 500.0000 mg | ORAL_TABLET | Freq: Two times a day (BID) | ORAL | 5 refills | Status: DC

## 2021-05-24 MED ORDER — CLOMIPHENE CITRATE 50 MG PO TABS: ORAL_TABLET | ORAL | 0 refills | Status: DC

## 2021-05-24 NOTE — Telephone Encounter (Signed)
Transition Care Management Follow-up Telephone Call Date of discharge and from where: 05/23/2021-[facility]  How have you been since you were released from the hospital? Patient stated she is ok, she still has a little nausea but has her medication.  Any questions or concerns? No  Items Reviewed: Did the pt receive and understand the discharge instructions provided? Yes  Medications obtained and verified? Yes  Other? No  Any new allergies since your discharge? No  Dietary orders reviewed? No Do you have support at home? Yes   Home Care and Equipment/Supplies: Were home health services ordered? not applicable If so, what is the name of the agency? N/A  Has the agency set up a time to come to the patient's home? not applicable Were any new equipment or medical supplies ordered?  No What is the name of the medical supply agency? N/A Were you able to get the supplies/equipment? not applicable Do you have any questions related to the use of the equipment or supplies? No  Functional Questionnaire: (I = Independent and D = Dependent) ADLs: I  Bathing/Dressing- I  Meal Prep- I  Eating- I  Maintaining continence- I  Transferring/Ambulation- I  Managing Meds- I  Follow up appointments reviewed:  PCP Hospital f/u appt confirmed? No   Specialist Hospital f/u appt confirmed? No   Are transportation arrangements needed? No  If their condition worsens, is the pt aware to call PCP or go to the Emergency Dept.? Yes Was the patient provided with contact information for the PCP's office or ED? Yes Was to pt encouraged to call back with questions or concerns? Yes

## 2021-05-24 NOTE — Progress Notes (Signed)
TELEHEALTH GYNECOLOGY VISIT ENCOUNTER NOTE  Provider location: [facility] for [employer] [facility] [facility]   Patient location: Home  I connected with Melissa [name] on 05/24/21 at 11:15 AM EST by telephone and verified that I am speaking with the correct person using two identifiers. Patient was unable to do MyChart audiovisual encounter due to technical difficulties, she tried several times.    I discussed the limitations, risks, security and privacy concerns of performing an evaluation and management service by telephone and the availability of in person appointments. I also discussed with the patient that there may be a patient responsible charge related to this service. The patient expressed understanding and agreed to proceed.   History:  Melissa [name] is a 46 y.o. [phone] female being evaluated today for fertility discussion.   Last seen 03/21/2021, difficulty conceiving with new partner at that time but only 3 months of trying, recommended she use fertility tracking apps/kits and continue to try, follow up in 3 months  Has been using ovulation predictor kits Has been ovulating regularly Pregnancy tests have been negative  Feeling frustrated Partner has three year old from different relationship, is a type 1 diabetic on insulin     Past Medical History:  Diagnosis Date   Abnormal Pap smear 08/30/2008   LGSIL   Allergy    BV (bacterial vaginosis)    recurring   Chlamydia    Chronic pain of left knee    Ectopic pregnancy    Fibroid    Infertility    Secondary PCOS   PCOS (polycystic ovarian syndrome)    Shingles outbreak 11/2011   Vertigo    Past Surgical History:  Procedure Laterality Date   APPENDECTOMY     DILATION AND CURETTAGE OF UTERUS  06/2011   ectopic   EAR TUBE REMOVAL     ECTOPIC PREGNANCY SURGERY     LAPAROSCOPY  07/05/2011   Procedure: LAPAROSCOPY OPERATIVE;  Surgeon: Osborne Oman, MD;  Location: [facility];  Service: Gynecology;   Laterality: N/A;   right ear surgery     WISDOM TOOTH EXTRACTION     The following portions of the patient's history were reviewed and updated as appropriate: allergies, current medications, past family history, past medical history, past social history, past surgical history and problem list.   Health Maintenance:   Last pap: Recent Labs[] Expand by Default        Lab Results  Component Value Date    DIAGPAP   03/16/2019      - Negative for intraepithelial lesion or malignancy (NILM)    [place] Negative 03/16/2019          Last mammogram:  06/20/2020 - BIRADS 1   Review of Systems:  Pertinent items noted in HPI and remainder of comprehensive ROS otherwise negative.  Physical Exam:   General:  Alert, oriented and cooperative.   Mental Status: Normal mood and affect perceived. Normal judgment and thought content.  Physical exam deferred due to nature of the encounter  Labs and Imaging No results found for this or any previous visit (from the past 336 hour(s)). No results found.    Assessment and Plan:     1. Uterine fibroids, antepartum condition or complication  - Ambulatory referral to Obstetrics / Gynecology  2. Encounter for fertility planning Patient very eager to try metformin/clomid for her infertility as this worked very well with her last two pregnancies We discussed that given her moliminal symptoms, positive ovulation predictor kits, I don't  believe anovulation is likely to be the cause of her inability to get pregnant at this point Three likely possibilities discussed with patient: Her uterine fibroids may have grown to a point where they are impinging significantly on the endometrial cavity and affecting her fertility, and on my subjective review of her CT scans from 2022 and 2019 there does seem to be enlargement of the fibroids and distortion of the endometrial cavity Remaining tube (hx of tubal/salpingectomy) may not be patient Fertility issue with her  partner, possibly due to medical comorbidities Strongly recommended that she go to [facility] for evaluation of the above issues and she is in agreement Can also trial metformin/clomid in the meantime, with understanding of my reservations and that I don't believe this is addressing the route cause of her infertility  - Ambulatory referral to Obstetrics / Gynecology - metFORMIN (GLUCOPHAGE) 500 MG tablet; Take 1 tablet (500 mg total) by mouth 2 (two) times daily with a meal.  Dispense: 60 tablet; Refill: 5 - clomiPHENE (CLOMID) 50 MG tablet; Starting on the fifth day after your period starts, take one tablet daily for five days to help induce ovulation. Repeat with each menstrual cycle.  Dispense: 30 tablet; Refill: 0  3. Ruptured ectopic pregnancy  - Ambulatory referral to Obstetrics / Gynecology  4. Screening mammogram for breast cancer Due for repeat mammogram next month, order placed and encouraged her to schedule an appt - MM Digital Screening; Future       I discussed the assessment and treatment plan with the patient. The patient was provided an opportunity to ask questions and all were answered. The patient agreed with the plan and demonstrated an understanding of the instructions.   The patient was advised to call back or seek an in-person evaluation/go to the ED if the symptoms worsen or if the condition fails to improve as anticipated.  I provided 25 minutes of non-face-to-face time during this encounter.   Clarnce Flock, MD Center for [place], [place]

## 2021-05-30 ENCOUNTER — Telehealth: Payer: Self-pay

## 2021-05-30 NOTE — Telephone Encounter (Signed)
° °  Telephone encounter was:  Successful.  05/30/2021 Name: Melissa [name] MRN: [id number] DOB: Oct 05, 1975  Melissa [name] is a 46 y.o. year old female who is a primary care patient of Patient, No Pcp Per (Inactive) . The community resource team was consulted for assistance with  PCP INFORMATION  Care guide performed the following interventions: Patient provided with information about care guide support team and interviewed to confirm resource needs.Patient requested PCP Information to be emailed to her  Follow Up Plan:  No further follow up planned at this time. The patient has been provided with needed resources.   [place], Care Management  [phone] [address], [place], [place] [address] Phone: [phone] Email: [name]

## 2021-06-05 DIAGNOSIS — Z20828 Contact with and (suspected) exposure to other viral communicable diseases: Secondary | ICD-10-CM | POA: Diagnosis not present

## 2021-06-17 DIAGNOSIS — S338XXA Sprain of other parts of lumbar spine and pelvis, initial encounter: Secondary | ICD-10-CM | POA: Diagnosis not present

## 2021-06-17 DIAGNOSIS — H66001 Acute suppurative otitis media without spontaneous rupture of ear drum, right ear: Secondary | ICD-10-CM | POA: Diagnosis not present

## 2021-06-20 ENCOUNTER — Ambulatory Visit: Payer: Medicaid Other | Admitting: Family Medicine

## 2021-07-01 ENCOUNTER — Telehealth (HOSPITAL_BASED_OUTPATIENT_CLINIC_OR_DEPARTMENT_OTHER): Payer: Self-pay

## 2021-07-17 ENCOUNTER — Emergency Department (HOSPITAL_BASED_OUTPATIENT_CLINIC_OR_DEPARTMENT_OTHER)
Admission: EM | Admit: 2021-07-17 | Discharge: 2021-07-17 | Disposition: A | Discharge status: Home / Self Care | Payer: Medicaid Other | Attending: Emergency Medicine | Admitting: Emergency Medicine

## 2021-07-17 ENCOUNTER — Encounter (HOSPITAL_BASED_OUTPATIENT_CLINIC_OR_DEPARTMENT_OTHER): Payer: Self-pay | Admitting: *Deleted

## 2021-07-17 ENCOUNTER — Other Ambulatory Visit: Payer: Self-pay

## 2021-07-17 ENCOUNTER — Emergency Department (HOSPITAL_BASED_OUTPATIENT_CLINIC_OR_DEPARTMENT_OTHER): Payer: Medicaid Other

## 2021-07-17 DIAGNOSIS — Z7982 Long term (current) use of aspirin: Secondary | ICD-10-CM | POA: Insufficient documentation

## 2021-07-17 DIAGNOSIS — Z20822 Contact with and (suspected) exposure to covid-19: Secondary | ICD-10-CM | POA: Diagnosis not present

## 2021-07-17 DIAGNOSIS — R059 Cough, unspecified: Secondary | ICD-10-CM | POA: Diagnosis not present

## 2021-07-17 DIAGNOSIS — R0602 Shortness of breath: Secondary | ICD-10-CM | POA: Insufficient documentation

## 2021-07-17 DIAGNOSIS — H9203 Otalgia, bilateral: Secondary | ICD-10-CM | POA: Diagnosis present

## 2021-07-17 DIAGNOSIS — H66013 Acute suppurative otitis media with spontaneous rupture of ear drum, bilateral: Secondary | ICD-10-CM | POA: Insufficient documentation

## 2021-07-17 DIAGNOSIS — R067 Sneezing: Secondary | ICD-10-CM | POA: Insufficient documentation

## 2021-07-17 DIAGNOSIS — H66003 Acute suppurative otitis media without spontaneous rupture of ear drum, bilateral: Secondary | ICD-10-CM | POA: Diagnosis not present

## 2021-07-17 LAB — URINALYSIS, ROUTINE W REFLEX MICROSCOPIC
Bilirubin Urine: NEGATIVE
Glucose, UA: NEGATIVE mg/dL
Ketones, ur: NEGATIVE mg/dL
Leukocytes,Ua: NEGATIVE
Nitrite: NEGATIVE
Protein, ur: 30 mg/dL — AB
Specific Gravity, Urine: >=1.03 (ref 1.005–1.030)
pH: 5.5 (ref 5.0–8.0)

## 2021-07-17 LAB — URINALYSIS, MICROSCOPIC (REFLEX): RBC / HPF: >50 RBC/hpf (ref 0–5)

## 2021-07-17 LAB — PREGNANCY, URINE: Preg Test, Ur: NEGATIVE

## 2021-07-17 LAB — RESP PANEL BY RT-PCR (FLU A&B, COVID) ARPGX2
Influenza A by PCR: NEGATIVE
Influenza B by PCR: NEGATIVE
SARS Coronavirus 2 by RT PCR: NEGATIVE

## 2021-07-17 MED ORDER — CEFDINIR 300 MG PO CAPS: 300.0000 mg | ORAL_CAPSULE | Freq: Two times a day (BID) | ORAL | 0 refills | Status: AC

## 2021-07-17 MED ORDER — FLUCONAZOLE 150 MG PO TABS: 150.0000 mg | ORAL_TABLET | Freq: Every day | ORAL | 0 refills | Status: AC

## 2021-07-17 NOTE — Discharge Instructions (Addendum)
Return to ED with any new symptoms such as continued shortness of breath, fevers, diarrhea ?Please call the primary care office I referred you to.  Their number is attached to this document.  You will need to set up a new patient appointment to establish care. ?Please also follow-up with the ENT office I referred you to.  Due to your recurrent ear infections, it is important for you to establish care with a ENT office so that they can begin to manage this issue for you. ?Please refer to the attached informational guides I have attached concerning otitis media ?Please pick up your new antibiotic prescription medication at your earliest convenience. ?Additionally, I have sent in medication for any possible yeast infection you might experience as a result of antibiotic therapy.  Please do not take this medication unless you are experiencing symptoms that include vaginal itching, white discharge. ?

## 2021-07-17 NOTE — ED Provider Notes (Signed)
[facility] Provider Note   CSN: [id number] Arrival date & time: 07/17/21  0755     History  Chief Complaint  Patient presents with   Shortness of Breath    Melissa [name] is a 46 y.o. female with history of shingles, ectopic pregnancy, PCOS, vertigo, recurrent otitis media.  Patient presents to ED due to shortness of breath.  Patient states that in January she was diagnosed with COVID and has since then been suffering from shortness of breath with exertion, sneezing, itchy red eyes and bilateral ear pain.  Patient states that she was seen for these complaints at urgent care and placed on prescription of Augmentin for either a sinus infection or ear infection, she is unable to tell me.  She is also unable to tell me when she visited urgent care and I am unable to see any records of this visit.  Patient reports that after taking the Augmentin for 2 days she began to experience itchy red skin which she states was the reason that she stopped taking the medication.  Patient states that she did not return to the urgent care clinic or follow-up to be placed on another antibiotic medication.  Patient continues that she presented to work last night where she was advised to go home because her employer was worried that she still was contagious with COVID.  Patient is endorsing ear pain bilaterally, shortness of breath with exertion, sneezing.  Patient denies fevers, nausea, vomiting, abdominal pain, diarrhea, dysuria, flank pain.   Shortness of Breath Associated symptoms: ear pain   Associated symptoms: no abdominal pain, no fever and no vomiting       Home Medications Prior to Admission medications   Medication Sig Start Date End Date Taking? Authorizing Provider  aspirin EC 81 MG tablet Take 1 tablet (81 mg total) by mouth daily. Swallow whole. 03/21/21   Clarnce Flock, MD  clomiPHENE (CLOMID) 50 MG tablet Starting on the fifth day after your period starts, take  one tablet daily for five days to help induce ovulation. Repeat with each menstrual cycle. 05/24/21   Clarnce Flock, MD  cyclobenzaprine (FLEXERIL) 10 MG tablet Take 1 tablet (10 mg total) by mouth at bedtime as needed for muscle spasms. 03/02/21   Rayna Sexton, PA-C  ibuprofen (ADVIL) 800 MG tablet Take 1 tablet (800 mg total) by mouth 3 (three) times daily. Patient not taking: Reported on 05/24/2021 10/15/20   Garald Balding, PA-C  metFORMIN (GLUCOPHAGE) 500 MG tablet Take 1 tablet (500 mg total) by mouth 2 (two) times daily with a meal. 05/24/21   Clarnce Flock, MD  naproxen (NAPROSYN) 500 MG tablet Take 1 tablet (500 mg total) by mouth 2 (two) times daily as needed. Patient not taking: Reported on 05/24/2021 03/02/21   Rayna Sexton, PA-C  ondansetron (ZOFRAN-ODT) 4 MG disintegrating tablet Take 1 tablet (4 mg total) by mouth every 8 (eight) hours as needed for nausea or vomiting. 05/23/21   Carlisle Cater, PA-C  Prenatal Vit-Fe Fumarate-FA (MULTIVITAMIN-PRENATAL) 27-0.8 MG TABS tablet Take 1 tablet by mouth daily at 12 noon.    [provider]  valACYclovir (VALTREX) 1000 MG tablet Take 1 tablet (1,000 mg total) by mouth 3 (three) times daily. 05/23/21   Carlisle Cater, PA-C  diphenhydrAMINE (BENADRYL) 12.5 MG/5ML liquid Take 12.5 mg by mouth once.  08/01/11  [provider]      Allergies    Philippa Sicks scolymus (artichoke)]    Review of  Systems   Review of Systems  Constitutional:  Positive for appetite change. Negative for chills and fever.  HENT:  Positive for ear discharge, ear pain and sneezing.   Respiratory:  Positive for shortness of breath.   Gastrointestinal:  Negative for abdominal pain, diarrhea, nausea and vomiting.  Genitourinary:  Negative for dysuria and flank pain.  All other systems reviewed and are negative.  Physical Exam Updated Vital Signs BP 136/82 (BP Location: Left Arm)    Pulse 79    Temp 98.2 F (36.8 C) (Oral)    Resp 17     Ht '5\' 4"'$  (1.626 m)    Wt 111.6 kg    LMP 07/16/2021    SpO2 100%    BMI 42.23 kg/m  Physical Exam Vitals and nursing note reviewed.  Constitutional:      General: She is not in acute distress.    Appearance: She is not ill-appearing, toxic-appearing or diaphoretic.  HENT:     Head: Normocephalic and atraumatic.     Right Ear: Tympanic membrane is erythematous and bulging.     Left Ear: Tympanic membrane is perforated and erythematous.     Ears:     Comments: Purulent discharge noted to the left ear canal.  Patient right tympanic membrane bulging with erythema surrounding.    Nose: Nose normal.     Mouth/Throat:     Mouth: Mucous membranes are moist.  Eyes:     Extraocular Movements: Extraocular movements intact.     Pupils: Pupils are equal, round, and reactive to light.  Cardiovascular:     Rate and Rhythm: Normal rate and regular rhythm.  Pulmonary:     Effort: Pulmonary effort is normal. No accessory muscle usage or respiratory distress.     Breath sounds: Normal breath sounds. No decreased breath sounds, wheezing, rhonchi or rales.  Abdominal:     General: Abdomen is flat. Bowel sounds are normal.     Palpations: Abdomen is soft.     Tenderness: There is no abdominal tenderness. There is no right CVA tenderness, left CVA tenderness or guarding.  Musculoskeletal:     Cervical back: Normal range of motion and neck supple. No tenderness.     Right lower leg: No edema.     Left lower leg: No edema.  Skin:    General: Skin is warm and dry.     Capillary Refill: Capillary refill takes less than 2 seconds.  Neurological:     Mental Status: She is alert and oriented to person, place, and time.    ED Results / Procedures / Treatments   Labs (all labs ordered are listed, but only abnormal results are displayed) Labs Reviewed  URINALYSIS, ROUTINE W REFLEX MICROSCOPIC - Abnormal; Notable for the following components:      Result Value   APPearance HAZY (*)    Hgb urine  dipstick LARGE (*)    Protein, ur 30 (*)    All other components within normal limits  URINALYSIS, MICROSCOPIC (REFLEX) - Abnormal; Notable for the following components:   Bacteria, UA RARE (*)    All other components within normal limits  RESP PANEL BY RT-PCR (FLU A&B, COVID) ARPGX2  PREGNANCY, URINE    EKG None  Radiology DG Chest 2 View  Result Date: 07/17/2021 CLINICAL DATA:  46 year old female with history of cough and shortness of breath. EXAM: CHEST - 2 VIEW COMPARISON:  Chest x-ray 09/30/2020. FINDINGS: Lung volumes are normal. No consolidative airspace disease. No pleural effusions. No  pneumothorax. No pulmonary nodule or mass noted. Pulmonary vasculature and the cardiomediastinal silhouette are within normal limits. IMPRESSION: No radiographic evidence of acute cardiopulmonary disease. Electronically Signed   By: Vinnie Langton M.D.   On: 07/17/2021 10:23    Procedures Procedures    Medications Ordered in ED Medications - No data to display  ED Course/ Medical Decision Making/ A&P                           Medical Decision Making Amount and/or Complexity of Data Reviewed Labs: ordered. Radiology: ordered.   46 year old female with history of recurrent otitis media presents to ED for evaluation of bilateral ear pain, shortness of breath.  Patient reports she was infected with COVID-19 in January and has since had lingering symptoms as result.  On examination, the patient is afebrile, nontachycardic, nonhypoxic with 100% oxygen saturation on room air, clear lung sounds bilaterally, soft compressible abdomen.  The patient is nontoxic in appearance, she is able to speak full sentences and is maintaining her airway and secretions appropriately.  Patient reports that she was placed on antibiotics for ear infection in the last month however she did not complete the full course of antibiotics due to suppose it allergic reaction.  Due to this, I have placed the patient on  cefdinir 300 mg twice daily for the next 7 days.  I will also refer the patient to a PCP as well as ENT due to her recurrent otitis media.  Work-up here includes following labs interpreted by me as well as imaging studies: - Respiratory panel negative - Pregnancy test negative - Urinalysis shows protein in urine, large hemoglobin.  Patient denies any urinary symptoms, flank pain, dysuria, vaginal discharge. - Patient chest x-ray does not show any signs of consolidation, effusion, cardiomegaly, mediastinal widening, pneumothorax or atelectasis.  While here in the department, the patient's lung sounds have remained clear.  Her oxygen saturation has remained at 1% on room air.  The patient is handling secretions appropriately, sitting upright in no apparent respiratory distress.  At this time, I feel this patient is stable for discharge.  I have discussed the patient and her case with Dr. Roslynn Amble my attending who is in agreement.  I will refer the patient to PCP as well as ENT for suppose ear infections.  I placed the patient on antibiotic therapy as indicated.  The patient states that typically with antibiotic medication she experiences resulting yeast infection.  She has requested that I send in fluconazole for her to have in the event that she develops a yeast infection, I have sent this medication in.  I provided the patient with return precautions and she voiced understanding.  All the questions of the patient have been answered to her satisfaction.  Patient stable this time for discharge.   Final Clinical Impression(s) / ED Diagnoses Final diagnoses:  Acute suppurative otitis media of both ears with spontaneous rupture of tympanic membranes, recurrence not specified    Rx / DC Orders ED Discharge Orders     None         Azucena Cecil, PA-C 07/17/21 1118    Lucrezia Starch, MD 07/19/21 1154

## 2021-07-17 NOTE — ED Triage Notes (Signed)
C/o shortness of breath since feb 27,  bilateral ear pain.  Rt flank pain, sneezing ?

## 2021-07-17 NOTE — ED Notes (Addendum)
Coughing ears clogged up / h/a since the  feb 27 wants covid test  rt side pain since coughing hard ?

## 2021-07-18 ENCOUNTER — Telehealth: Payer: Self-pay

## 2021-07-18 NOTE — Telephone Encounter (Signed)
Transition Care Management Unsuccessful Follow-up Telephone Call ? ?Date of discharge and from where:  07/17/2021-[facility] ? ?Attempts:  1st Attempt ? ?Reason for unsuccessful TCM follow-up call:  Left voice message ? ?  ?

## 2021-07-19 NOTE — Telephone Encounter (Signed)
Transition Care Management Unsuccessful Follow-up Telephone Call ? ?Date of discharge and from where:  07/17/2021 from [facility] ? ?Attempts:  2nd Attempt ? ?Reason for unsuccessful TCM follow-up call:  Left voice message ? ? ? ?

## 2021-07-20 NOTE — Telephone Encounter (Signed)
Transition Care Management Unsuccessful Follow-up Telephone Call ? ?Date of discharge and from where:  07/17/2021-[facility] ? ?Attempts:  3rd Attempt ? ?Reason for unsuccessful TCM follow-up call:  Left voice message ? ?  ?

## 2021-08-07 DIAGNOSIS — Z9049 Acquired absence of other specified parts of digestive tract: Secondary | ICD-10-CM | POA: Diagnosis not present

## 2021-08-07 DIAGNOSIS — D259 Leiomyoma of uterus, unspecified: Secondary | ICD-10-CM | POA: Diagnosis not present

## 2021-08-07 DIAGNOSIS — D373 Neoplasm of uncertain behavior of appendix: Secondary | ICD-10-CM | POA: Diagnosis not present

## 2021-08-07 DIAGNOSIS — R9389 Abnormal findings on diagnostic imaging of other specified body structures: Secondary | ICD-10-CM | POA: Diagnosis not present

## 2021-08-07 DIAGNOSIS — Z8742 Personal history of other diseases of the female genital tract: Secondary | ICD-10-CM | POA: Diagnosis not present

## 2021-08-09 ENCOUNTER — Other Ambulatory Visit: Payer: Self-pay | Admitting: Family Medicine

## 2021-08-09 DIAGNOSIS — Z3189 Encounter for other procreative management: Secondary | ICD-10-CM

## 2021-08-13 DIAGNOSIS — H6691 Otitis media, unspecified, right ear: Secondary | ICD-10-CM | POA: Diagnosis not present

## 2021-08-23 ENCOUNTER — Encounter: Payer: Self-pay | Admitting: Obstetrics and Gynecology

## 2021-08-23 ENCOUNTER — Ambulatory Visit (INDEPENDENT_AMBULATORY_CARE_PROVIDER_SITE_OTHER): Payer: Medicaid Other | Admitting: Obstetrics and Gynecology

## 2021-08-23 VITALS — BP 132/75 | HR 83 | Wt 253.5 lb

## 2021-08-23 DIAGNOSIS — Z3189 Encounter for other procreative management: Secondary | ICD-10-CM

## 2021-08-23 DIAGNOSIS — D219 Benign neoplasm of connective and other soft tissue, unspecified: Secondary | ICD-10-CM

## 2021-08-23 NOTE — Patient Instructions (Signed)
Things to discuss ? ?IUI: intrauterine insemination ?Sperm evaluation and preparation ?ICSI ?What about my fibroids?  [place] pending ?Do I need donor egg ?

## 2021-08-23 NOTE — Progress Notes (Signed)
U/S scheduled for 09/04/21 @ 0745.  Pt notified.   ? ?Ruvi Fullenwider,RN  ?08/23/21 ?

## 2021-08-23 NOTE — Progress Notes (Signed)
Appendix removed 07/13/20. During follow up CT enlarged uterus seen.  ? ?Has been unable to conceive. Menstrual cycle every 28 days. Still desires pregnancy. ?

## 2021-08-23 NOTE — Progress Notes (Signed)
?  CC: enlarged uterus ?Subjective:  ? ? Patient ID: Melissa [name], female    DOB: 18-Nov-1975, 46 y.o.   MRN: [id number] ? ?HPI ?Pt seen due to recent CT of enlarged fibroid uterus.  Pt is concerned that her large fibroids may be contributing to her difficulty achieving pregnancy.  CT scan did not give uterine dimensions or size of fibroids.  Discussed increasing difficulty achieving pregnancy due to age.  Pt notes spouse is a type 1 diabetic and his condition may also be contributing to infertility.  Pt advised she would be better served seeing REI to achieve pregnancy due to age and other contributing factors. ? ? ?Review of Systems ? ?   ?Objective:  ? Physical Exam ?Vitals:  ? 08/23/21 1621  ?BP: 132/75  ?Pulse: 83  ? ? ? ? ?   ?Assessment & Plan:  ? ?1. Fibroid ?Will order pelvic ultrasound to evaluate uterus, number and size of fibroids. ? ?- US PELVIC COMPLETE WITH TRANSVAGINAL; Future ? ?2. Encounter for fertility planning ?Pt strongly advised to seek consultation with fertility specialist regarding pregnancy. ? ?- US PELVIC COMPLETE WITH TRANSVAGINAL; Future ? ?I spent 10 minutes dedicated to the care of this patient including previsit review of records, face to face time with the patient discussing disease etiology, treatment options, diagnostic plans and post visit testing.  ? ?F/u in 3 weeks to review ultrasound findings ? ?Griffin Basil, MD ?Faculty Attending, [facility]  ?

## 2021-09-04 ENCOUNTER — Other Ambulatory Visit: Payer: Medicaid Other

## 2021-09-05 ENCOUNTER — Other Ambulatory Visit: Payer: Self-pay | Admitting: Obstetrics and Gynecology

## 2021-09-05 DIAGNOSIS — Z1231 Encounter for screening mammogram for malignant neoplasm of breast: Secondary | ICD-10-CM

## 2021-09-09 ENCOUNTER — Encounter (HOSPITAL_BASED_OUTPATIENT_CLINIC_OR_DEPARTMENT_OTHER): Payer: Self-pay | Admitting: Emergency Medicine

## 2021-09-09 ENCOUNTER — Other Ambulatory Visit: Payer: Self-pay

## 2021-09-09 ENCOUNTER — Emergency Department (HOSPITAL_BASED_OUTPATIENT_CLINIC_OR_DEPARTMENT_OTHER)
Admission: EM | Admit: 2021-09-09 | Discharge: 2021-09-09 | Discharge status: Against Medical Advice | Payer: Medicaid Other | Attending: Emergency Medicine | Admitting: Emergency Medicine

## 2021-09-09 DIAGNOSIS — Z5321 Procedure and treatment not carried out due to patient leaving prior to being seen by health care provider: Secondary | ICD-10-CM | POA: Diagnosis not present

## 2021-09-09 DIAGNOSIS — H9201 Otalgia, right ear: Secondary | ICD-10-CM | POA: Diagnosis not present

## 2021-09-09 NOTE — ED Triage Notes (Signed)
Reports pain in right ear canal.  Pain radiates from the ear down that side of the face.  Hx of shingles multiple times.  Feels like this is the same.  Has not had any valtrex in a while. ?

## 2021-09-10 DIAGNOSIS — H6691 Otitis media, unspecified, right ear: Secondary | ICD-10-CM | POA: Diagnosis not present

## 2021-09-10 DIAGNOSIS — B029 Zoster without complications: Secondary | ICD-10-CM | POA: Diagnosis not present

## 2021-09-11 ENCOUNTER — Ambulatory Visit: Payer: Medicaid Other

## 2021-09-12 ENCOUNTER — Ambulatory Visit
Admission: RE | Admit: 2021-09-12 | Discharge: 2021-09-12 | Disposition: A | Discharge status: Home / Self Care | Payer: Medicaid Other | Source: Ambulatory Visit | Attending: Obstetrics and Gynecology | Admitting: Obstetrics and Gynecology

## 2021-09-12 DIAGNOSIS — D219 Benign neoplasm of connective and other soft tissue, unspecified: Secondary | ICD-10-CM | POA: Diagnosis not present

## 2021-09-12 DIAGNOSIS — Z3189 Encounter for other procreative management: Secondary | ICD-10-CM | POA: Insufficient documentation

## 2021-09-12 DIAGNOSIS — N979 Female infertility, unspecified: Secondary | ICD-10-CM | POA: Diagnosis not present

## 2021-09-12 DIAGNOSIS — N852 Hypertrophy of uterus: Secondary | ICD-10-CM | POA: Diagnosis not present

## 2021-09-12 DIAGNOSIS — D259 Leiomyoma of uterus, unspecified: Secondary | ICD-10-CM | POA: Diagnosis not present

## 2021-09-13 DIAGNOSIS — Z1231 Encounter for screening mammogram for malignant neoplasm of breast: Secondary | ICD-10-CM

## 2021-09-14 DIAGNOSIS — K0381 Cracked tooth: Secondary | ICD-10-CM | POA: Diagnosis not present

## 2021-09-14 DIAGNOSIS — K0889 Other specified disorders of teeth and supporting structures: Secondary | ICD-10-CM | POA: Diagnosis not present

## 2021-09-15 ENCOUNTER — Ambulatory Visit: Payer: Medicaid Other | Admitting: Family Medicine

## 2021-09-20 ENCOUNTER — Ambulatory Visit
Admission: RE | Admit: 2021-09-20 | Discharge: 2021-09-20 | Disposition: A | Discharge status: Home / Self Care | Payer: Medicaid Other | Source: Ambulatory Visit | Attending: Obstetrics and Gynecology | Admitting: Obstetrics and Gynecology

## 2021-09-20 DIAGNOSIS — Z1231 Encounter for screening mammogram for malignant neoplasm of breast: Secondary | ICD-10-CM | POA: Diagnosis not present

## 2021-09-22 ENCOUNTER — Encounter: Payer: Self-pay | Admitting: Obstetrics and Gynecology

## 2021-09-22 ENCOUNTER — Ambulatory Visit (INDEPENDENT_AMBULATORY_CARE_PROVIDER_SITE_OTHER): Payer: Medicaid Other | Admitting: Obstetrics and Gynecology

## 2021-09-22 VITALS — BP 120/87 | HR 67 | Wt 254.6 lb

## 2021-09-22 DIAGNOSIS — Z3189 Encounter for other procreative management: Secondary | ICD-10-CM | POA: Diagnosis not present

## 2021-09-22 DIAGNOSIS — O3410 Maternal care for benign tumor of corpus uteri, unspecified trimester: Secondary | ICD-10-CM | POA: Diagnosis not present

## 2021-09-22 NOTE — Progress Notes (Signed)
?  CC: ultrasound follow up ?Subjective:  ? ? Patient ID: Melissa [name], female    DOB: 06/16/1975, 46 y.o.   MRN: [id number] ? ?HPI ?46 yo G5P3 seen to discuss fibroids on ultrasound and fertility issues.  4 dominant fibroids noted.  Treatment options discussed including Sonata and myfembree.  Sonata may be associated with continued fertility, not fully FDA approved at this time.  Again emphasized that due to age and other issues, that she may want to at least have a consultation with assisted reproduction before proceeding to optimize her time. ? ? ?Review of Systems ? ?   ?Objective:  ? Physical Exam ?Vitals:  ? 09/22/21 0932  ?BP: 120/87  ?Pulse: 67  ? ?CLINICAL DATA:  Fibroid uterus, infertility, LMP 09/06/2021 ?  ?EXAM: ?TRANSABDOMINAL AND TRANSVAGINAL ULTRASOUND OF PELVIS ?  ?TECHNIQUE: ?Both transabdominal and transvaginal ultrasound examinations of the ?pelvis were performed. Transabdominal technique was performed for ?global imaging of the pelvis including uterus, ovaries, adnexal ?regions, and pelvic cul-de-sac. It was necessary to proceed with ?endovaginal exam following the transabdominal exam to visualize the ?endometrium and ovaries. ?  ?COMPARISON:  10/06/2015 ?  ?FINDINGS: ?Uterus ?  ?Measurements: 12.6 x 6.4 x 10.3 cm = volume: 433 mL. Enlarged ?uterus, anteverted, containing multiple masses consistent with ?leiomyomata. Measured lesions include a transmural leiomyoma RIGHT ?upper uterus 6.7 x 6.3 x 6.2 cm and lateral mid LEFT uterus 5.7 x ?5.1 x 4.3 cm. Small subserosal leiomyoma posterior 2.1 x 1.4 x 1.7 ?cm. Anterior wall intramural leiomyoma 2.7 x 2.4 x 2.4 cm. ?  ?Endometrium ?  ?Thickness: 15 mm.  No endometrial fluid or mass ?  ?Right ovary ?  ?Measurements: 2.9 x 1.5 x 2.1 cm = volume: 6.7 mL. Normal morphology ?without mass 5 ?  ?Left ovary ?  ?Not visualized, likely obscured by bowel ?  ?Other findings ?  ?No free pelvic fluid or adnexal masses. ?  ?IMPRESSION: ?Enlarged uterus containing  multiple leiomyomata. ?  ?Nonvisualization of LEFT ovary. ?  ?Unremarkable endometrial complex and RIGHT ovary. ?  ?  ? ? ?   ?Assessment & Plan:  ? ?1. Uterine fibroids, antepartum condition or complication ?Treatment options discussed including myfembree, Sonata, UFE and hysterectomy.  Due to maternal age would not initially advise myomectomy due to age.  Sonata may be compatible with future fertility, but is not fully FDA approved for this indication. ? ?2. Encounter for fertility planning ?Advised consultation with fertility to discuss possible treatment options and risks. ?I spent 20 minutes dedicated to the care of this patient including previsit review of records, face to face time with the patient discussing treatment options and post visit testing.  ? ? ?F/u 11/23 for annual exam or PRN ? ? ?Griffin Basil, MD ?Faculty Attending, [facility]  ?

## 2021-09-22 NOTE — Progress Notes (Signed)
Patient visibly upset about struggles with infertility. Scores on PHQ9/GAD7 were addressed and patient was offered behavior health. She accepted and referral was put in. ?

## 2021-10-11 DIAGNOSIS — H6691 Otitis media, unspecified, right ear: Secondary | ICD-10-CM | POA: Diagnosis not present

## 2021-10-11 DIAGNOSIS — Z20822 Contact with and (suspected) exposure to covid-19: Secondary | ICD-10-CM | POA: Diagnosis not present

## 2021-10-12 DIAGNOSIS — H6011 Cellulitis of right external ear: Secondary | ICD-10-CM | POA: Diagnosis not present

## 2021-10-12 DIAGNOSIS — B029 Zoster without complications: Secondary | ICD-10-CM | POA: Diagnosis not present

## 2021-10-17 ENCOUNTER — Ambulatory Visit: Payer: Medicaid Other | Admitting: Family Medicine

## 2021-10-19 DIAGNOSIS — H66001 Acute suppurative otitis media without spontaneous rupture of ear drum, right ear: Secondary | ICD-10-CM | POA: Diagnosis not present

## 2021-11-09 ENCOUNTER — Encounter: Payer: Self-pay | Admitting: Family Medicine

## 2021-11-09 ENCOUNTER — Ambulatory Visit (INDEPENDENT_AMBULATORY_CARE_PROVIDER_SITE_OTHER): Payer: Medicaid Other | Admitting: *Deleted

## 2021-11-09 VITALS — BP 138/76 | HR 74 | Ht 64.0 in | Wt 254.5 lb

## 2021-11-09 DIAGNOSIS — Z3200 Encounter for pregnancy test, result unknown: Secondary | ICD-10-CM

## 2021-11-09 DIAGNOSIS — Z3201 Encounter for pregnancy test, result positive: Secondary | ICD-10-CM

## 2021-11-09 LAB — POCT PREGNANCY, URINE: Preg Test, Ur: POSITIVE — AB

## 2021-11-09 MED ORDER — PRENATAL VITAMINS 28-0.8 MG PO TABS: 1.0000 | ORAL_TABLET | Freq: Every day | ORAL | 11 refills | Status: DC

## 2021-11-09 NOTE — Progress Notes (Signed)
Here for nurse visit for pregnancy test which was positive. She reports sure LMP of 10/05/21 which makes her 59w0dwith EDD of 07/12/22. Advised to start prenatal care and PNV. She plans to go here again for prenatal care. Requested PNV RX, pregnancy confirmation and pregnancy restrictions.  Informed of prenatal care options. She choosed [facility]. Sent to desk to make new ob appointments.  LStaci Acosta

## 2021-11-15 ENCOUNTER — Other Ambulatory Visit: Payer: Self-pay

## 2021-11-15 ENCOUNTER — Emergency Department (HOSPITAL_BASED_OUTPATIENT_CLINIC_OR_DEPARTMENT_OTHER)
Admission: EM | Admit: 2021-11-15 | Discharge: 2021-11-15 | Disposition: A | Discharge status: Home / Self Care | Payer: Medicaid Other | Attending: Emergency Medicine | Admitting: Emergency Medicine

## 2021-11-15 ENCOUNTER — Telehealth: Payer: Self-pay | Admitting: General Practice

## 2021-11-15 ENCOUNTER — Encounter (HOSPITAL_BASED_OUTPATIENT_CLINIC_OR_DEPARTMENT_OTHER): Payer: Self-pay | Admitting: Emergency Medicine

## 2021-11-15 ENCOUNTER — Emergency Department (HOSPITAL_BASED_OUTPATIENT_CLINIC_OR_DEPARTMENT_OTHER): Payer: Medicaid Other

## 2021-11-15 DIAGNOSIS — O209 Hemorrhage in early pregnancy, unspecified: Secondary | ICD-10-CM

## 2021-11-15 DIAGNOSIS — Z7982 Long term (current) use of aspirin: Secondary | ICD-10-CM | POA: Diagnosis not present

## 2021-11-15 DIAGNOSIS — O039 Complete or unspecified spontaneous abortion without complication: Secondary | ICD-10-CM | POA: Diagnosis not present

## 2021-11-15 DIAGNOSIS — D219 Benign neoplasm of connective and other soft tissue, unspecified: Secondary | ICD-10-CM

## 2021-11-15 DIAGNOSIS — O3411 Maternal care for benign tumor of corpus uteri, first trimester: Secondary | ICD-10-CM | POA: Diagnosis not present

## 2021-11-15 DIAGNOSIS — O200 Threatened abortion: Secondary | ICD-10-CM

## 2021-11-15 DIAGNOSIS — O3680X0 Pregnancy with inconclusive fetal viability, not applicable or unspecified: Secondary | ICD-10-CM

## 2021-11-15 DIAGNOSIS — Z3A00 Weeks of gestation of pregnancy not specified: Secondary | ICD-10-CM | POA: Diagnosis not present

## 2021-11-15 DIAGNOSIS — O26891 Other specified pregnancy related conditions, first trimester: Secondary | ICD-10-CM | POA: Diagnosis not present

## 2021-11-15 LAB — URINALYSIS, ROUTINE W REFLEX MICROSCOPIC
Glucose, UA: NEGATIVE mg/dL
Ketones, ur: NEGATIVE mg/dL
Leukocytes,Ua: NEGATIVE
Nitrite: NEGATIVE
Protein, ur: 100 mg/dL — AB
Specific Gravity, Urine: >=1.03 (ref 1.005–1.030)
pH: 5.5 (ref 5.0–8.0)

## 2021-11-15 LAB — URINALYSIS, MICROSCOPIC (REFLEX): RBC / HPF: >50 RBC/hpf (ref 0–5)

## 2021-11-15 LAB — WET PREP, GENITAL
Clue Cells Wet Prep HPF POC: NONE SEEN
Sperm: NONE SEEN
Trich, Wet Prep: NONE SEEN
WBC, Wet Prep HPF POC: <10 (ref <10)
Yeast Wet Prep HPF POC: NONE SEEN

## 2021-11-15 LAB — PREGNANCY, URINE: Preg Test, Ur: NEGATIVE

## 2021-11-15 LAB — HCG, QUANTITATIVE, PREGNANCY: hCG, Beta Chain, Quant, S: 18 m[IU]/mL — ABNORMAL HIGH (ref <5)

## 2021-11-15 NOTE — ED Triage Notes (Signed)
Pt in with reported vaginal bleeding since this morning, confirmed pregnancy, LMP 5/25. States bleeding has gotten heavier throughout the day. Denies any abdominal pain, tearful in triage

## 2021-11-15 NOTE — ED Notes (Signed)
Reviewed prior labs in chart 11/19/2012 ABO grouping O pos/Rh neg

## 2021-11-15 NOTE — ED Provider Notes (Signed)
[facility] EMERGENCY DEPARTMENT Provider Note   CSN: [id number] Arrival date & time: 11/15/21  0049     History  Chief Complaint  Patient presents with   Vaginal Bleeding    Melissa [name] is a 46 y.o. female.  The history is provided by the patient.  Vaginal Bleeding Quality:  Spotting Severity:  Mild Onset quality:  Gradual Duration:  1 day Timing:  Intermittent Progression:  Waxing and waning Chronicity:  New Number of pads used:  1 Possible pregnancy: yes   Context: not after intercourse   Relieved by:  Nothing Worsened by:  Nothing Ineffective treatments:  None tried Associated symptoms: no fever   Associated symptoms comment:  Cramping Risk factors: hx of ectopic pregnancy   Risk factors: no bleeding disorder        Home Medications Prior to Admission medications   Medication Sig Start Date End Date Taking? Authorizing Provider  aspirin EC 81 MG tablet Take 1 tablet (81 mg total) by mouth daily. Swallow whole. 03/21/21   Clarnce Flock, MD  azithromycin (ZITHROMAX) 250 MG tablet Take 250 mg by mouth as directed. 08/13/21   [provider]  clomiPHENE (CLOMID) 50 MG tablet Starting on the fifth day after your period starts, take one tablet daily for five days to help induce ovulation. Repeat with each menstrual cycle. 05/24/21   Clarnce Flock, MD  cyclobenzaprine (FLEXERIL) 10 MG tablet Take 1 tablet (10 mg total) by mouth at bedtime as needed for muscle spasms. 03/02/21   Rayna Sexton, PA-C  fluconazole (DIFLUCAN) 150 MG tablet Take 150 mg by mouth every 3 (three) days. 08/13/21   [provider]  Fluticasone Propionate (FLONASE ALLERGY RELIEF NA) Place into the nose.    [provider]  ibuprofen (ADVIL) 800 MG tablet Take 1 tablet (800 mg total) by mouth 3 (three) times daily. 10/15/20   Garald Balding, PA-C  loratadine (CLARITIN) 10 MG tablet Take 1 tablet by mouth daily.    [provider]  metFORMIN  (GLUCOPHAGE) 500 MG tablet Take 1 tablet (500 mg total) by mouth 2 (two) times daily with a meal. 05/24/21   Clarnce Flock, MD  naproxen (NAPROSYN) 500 MG tablet Take 1 tablet (500 mg total) by mouth 2 (two) times daily as needed. 03/02/21   Rayna Sexton, PA-C  ondansetron (ZOFRAN-ODT) 4 MG disintegrating tablet Take 1 tablet (4 mg total) by mouth every 8 (eight) hours as needed for nausea or vomiting. Patient not taking: Reported on 08/23/2021 05/23/21   Carlisle Cater, PA-C  Prenatal Vit-Fe Fumarate-FA (MULTIVITAMIN-PRENATAL) 27-0.8 MG TABS tablet Take 1 tablet by mouth daily at 12 noon. Patient not taking: Reported on 11/09/2021    [provider]  Prenatal Vit-Fe Fumarate-FA (PRENATAL VITAMINS) 28-0.8 MG TABS Take 1 tablet by mouth daily. 11/09/21   Caren Macadam, MD  valACYclovir (VALTREX) 1000 MG tablet Take 1 tablet (1,000 mg total) by mouth 3 (three) times daily. Patient not taking: Reported on 08/23/2021 05/23/21   Carlisle Cater, PA-C  diphenhydrAMINE (BENADRYL) 12.5 MG/5ML liquid Take 12.5 mg by mouth once.  08/01/11  [provider]      Allergies    Philippa Sicks scolymus (artichoke)] and Prednisone    Review of Systems   Review of Systems  Constitutional:  Negative for fever.  HENT:  Negative for facial swelling.   Respiratory:  Negative for wheezing and stridor.   Genitourinary:  Positive for vaginal bleeding.  Neurological:  Negative for  facial asymmetry.  All other systems reviewed and are negative.   Physical Exam Updated Vital Signs BP (!) 164/99   Pulse (!) 109   Temp 98.3 F (36.8 C)   Resp 18   Wt 115.2 kg   LMP 10/05/2021   SpO2 100%   BMI 43.60 kg/m  Physical Exam Vitals and nursing note reviewed.  Constitutional:      General: She is not in acute distress.    Appearance: Normal appearance. She is well-developed.  HENT:     Head: Normocephalic and atraumatic.     Nose: Nose normal.  Eyes:     Extraocular Movements:  Extraocular movements intact.     Pupils: Pupils are equal, round, and reactive to light.  Cardiovascular:     Rate and Rhythm: Normal rate and regular rhythm.  Pulmonary:     Effort: Pulmonary effort is normal. No respiratory distress.     Breath sounds: Normal breath sounds.  Abdominal:     General: Abdomen is flat. Bowel sounds are normal. There is no distension.     Palpations: Abdomen is soft.     Tenderness: There is no abdominal tenderness. There is no guarding or rebound.  Genitourinary:    Vagina: No vaginal discharge.  Musculoskeletal:        General: Normal range of motion.     Cervical back: Neck supple.  Skin:    Capillary Refill: Capillary refill takes less than 2 seconds.     Findings: No erythema or rash.  Neurological:     General: No focal deficit present.     Mental Status: She is alert and oriented to person, place, and time.     Deep Tendon Reflexes: Reflexes normal.     ED Results / Procedures / Treatments   Labs (all labs ordered are listed, but only abnormal results are displayed) Labs Reviewed  URINALYSIS, ROUTINE W REFLEX MICROSCOPIC - Abnormal; Notable for the following components:      Result Value   Color, Urine AMBER (*)    APPearance CLOUDY (*)    Hgb urine dipstick LARGE (*)    Bilirubin Urine SMALL (*)    Protein, ur 100 (*)    All other components within normal limits  HCG, QUANTITATIVE, PREGNANCY - Abnormal; Notable for the following components:   hCG, Beta Chain, Quant, S 18 (*)    All other components within normal limits  URINALYSIS, MICROSCOPIC (REFLEX) - Abnormal; Notable for the following components:   Bacteria, UA FEW (*)    All other components within normal limits  WET PREP, GENITAL  PREGNANCY, URINE    EKG None  Radiology No results found.  Procedures Procedures    Medications Ordered in ED Medications - No data to display  ED Course/ Medical Decision Making/ A&P                           Medical Decision  Making Patient is G6 P3 and Opositive told she was pregnant based on urine on 6/29 with spotting and cramping  Problems Addressed: Fibroids: acute illness or injury    Details: follow up with GYN Pregnancy of unknown anatomic location: acute illness or injury    Details: follow up with OB  Amount and/or Complexity of Data Reviewed External Data Reviewed: notes.    Details: previous notes Labs: ordered.    Details: all labs reviewed: urine pregnancy test is negative.  HCG quant is 18.  No  UTI.  No acute findings on wet preg Radiology: ordered and independent interpretation performed.    Details: no IUP by me Discussion of management or test interpretation with external provider(s): Case d/w Dr. Nelda Marseille.  Call today for follow up within a week.  MAU if worsening bleeding or pain   Risk Risk Details: Patient is instructed to call OB today for close follow up and to go to MAU for worsening bleeding, pelvic pain, or any worsening symptoms.  Strict return precautions given.      Final Clinical Impression(s) / ED Diagnoses Final diagnoses:  None     Return for intractable cough, coughing up blood, fevers > 100.4 unrelieved by medication, shortness of breath, intractable vomiting, chest pain, shortness of breath, weakness, numbness, changes in speech, facial asymmetry, abdominal pain, passing out, Inability to tolerate liquids or food, cough, altered mental status or any concerns. No signs of systemic illness or infection. The patient is nontoxic-appearing on exam and vital signs are within normal limits.  I have reviewed the triage vital signs and the nursing notes. Pertinent labs & imaging results that were available during my care of the patient were reviewed by me and considered in my medical decision making (see chart for details). After history, exam, and medical workup I feel the patient has been appropriately medically screened and is safe for discharge home. Pertinent diagnoses were  discussed with the patient. Patient was given return precautions.  Rx / DC Orders ED Discharge Orders     None         Hayleen Clinkscales, MD 11/15/21 [phone]

## 2021-11-15 NOTE — Consult Note (Signed)
   OB/GYN Telephone Consult  11/15/2021  Melissa [name] is a 46 y.o. [id number] currently at [id number] LMP presented to ED due to pelvic pain/vaginal spotting. I was called for a consult regarding the care of this [facility]                                                         .    The provider had a clinical question about follow up/management  The provider presented the following relevant clinical information: [id number] who presented due to bleeding and cramping -Urine pregnancy negative -Quant hCG 18 -Pelvic [place]ore[place]oday showed no IUP, multiple fibroids seen ovaries not visualized, no free fluid  I performed a chart review on the patient and reviewed available documentation.   BP (!) 164/99   Pulse (!) 109   Temp 98.3 F (36.8 C)   Resp 18   Wt 115.2 kg   LMP 10/05/2021   SpO2 100%   BMI 43.60 kg/m   Exam- performed by consulting provider  -prior pregnancy test positive on 6/29 -no prior HCG or ultrasound for comparison other than positive urine pregnancy  Recommendations:  -Findings likely suggestive of miscarriage as even though we do not have prior quant HCG the level has likely declined from 6/29 -give bleeding precautions regarding follow up -Would advise follow up in one week with our office for repeat hCG level for confirmation of likely miscarriage -message sent to [place] Thank you for this consult and if additional recommendations are needed please call [phone] the [facility]   I spent approximately 5 minutes directly consulting with the provider and verbally discussing this case. Additionally 5 minutes minutes was spent performing chart review and documentation.   Melissa Pupa DO Attending [place] [facility] WDe[employer] [facility]

## 2021-11-15 NOTE — Telephone Encounter (Signed)
Patient called into front office stating she was seen in the ER earlier this morning and is wondering about follow up. Confirmed with patient she went to ER for spotting and mild cramping. Discussed follow up in our office on Friday 7/7 @ 830 for stat bhcg. Advised if bleeding becomes heavy or she has severe pain to go to MAU for evaluation. Patient verbalized understanding.

## 2021-11-15 NOTE — ED Notes (Signed)
US performed at bedside with female tech present in room.

## 2021-11-16 DIAGNOSIS — H66001 Acute suppurative otitis media without spontaneous rupture of ear drum, right ear: Secondary | ICD-10-CM | POA: Diagnosis not present

## 2021-11-17 ENCOUNTER — Ambulatory Visit (INDEPENDENT_AMBULATORY_CARE_PROVIDER_SITE_OTHER): Payer: Medicaid Other | Admitting: *Deleted

## 2021-11-17 ENCOUNTER — Other Ambulatory Visit: Payer: Self-pay

## 2021-11-17 VITALS — BP 147/81 | HR 73 | Ht 64.0 in | Wt 250.4 lb

## 2021-11-17 DIAGNOSIS — O200 Threatened abortion: Secondary | ICD-10-CM | POA: Diagnosis not present

## 2021-11-17 LAB — BETA HCG QUANT (REF LAB): hCG Quant: 4 m[IU]/mL

## 2021-11-17 NOTE — Progress Notes (Signed)
Pt presents for stat BHCG.  She reports continued light bleeding and no abdominal cramping. Pt advised that she will be called with BHCG results today and next steps of care. She stated that a detailed message can be left on voice mail if she is not able to answer the call. She was advised to return to [facility] if she develops heavy vaginal bleeding or severe abdominal/pelvic pain. Pt voiced understanding and stated she has history of ectopic and is aware of the symptoms.   1150  BHCG result reviewed by Dr. Dione Plover who finds results are consistent with miscarriage. I called pt and informed her of the result and offered condolences for her loss. No further testing is necessary @ this time. Pt voiced understanding and had no questions.

## 2021-11-20 ENCOUNTER — Telehealth: Payer: Self-pay | Admitting: *Deleted

## 2021-11-20 NOTE — Telephone Encounter (Signed)
Melissa [name] called and states she needs a note for work because she was not able to work due to bleeding/ pain/ being upset from miscarriage. She went to ED 7/5 and was seen in office 7/7 for stat bhcg which confirmed miscarriage. She states she had heavy bleeding and clots and pain and was very upset and was not able to work . She asked for note for from when she went to ED thru 11/19/21. I explained we can write her a note. She asked if it can say she is not able to perform her work duties due to miscarriage and to excuse dates 7/5-7/9 and may return to work 7/10. I explained I will get note ready and she will pick up today. Staci Acosta

## 2021-11-21 ENCOUNTER — Emergency Department (HOSPITAL_BASED_OUTPATIENT_CLINIC_OR_DEPARTMENT_OTHER)
Admission: EM | Admit: 2021-11-21 | Discharge: 2021-11-21 | Disposition: A | Discharge status: Home / Self Care | Payer: Medicaid Other | Attending: Emergency Medicine | Admitting: Emergency Medicine

## 2021-11-21 ENCOUNTER — Other Ambulatory Visit: Payer: Self-pay

## 2021-11-21 ENCOUNTER — Encounter (HOSPITAL_BASED_OUTPATIENT_CLINIC_OR_DEPARTMENT_OTHER): Payer: Self-pay | Admitting: Emergency Medicine

## 2021-11-21 DIAGNOSIS — Z7982 Long term (current) use of aspirin: Secondary | ICD-10-CM | POA: Insufficient documentation

## 2021-11-21 DIAGNOSIS — N939 Abnormal uterine and vaginal bleeding, unspecified: Secondary | ICD-10-CM | POA: Diagnosis not present

## 2021-11-21 DIAGNOSIS — R5383 Other fatigue: Secondary | ICD-10-CM | POA: Diagnosis not present

## 2021-11-21 DIAGNOSIS — R42 Dizziness and giddiness: Secondary | ICD-10-CM | POA: Diagnosis not present

## 2021-11-21 DIAGNOSIS — R110 Nausea: Secondary | ICD-10-CM | POA: Insufficient documentation

## 2021-11-21 LAB — URINALYSIS, MICROSCOPIC (REFLEX)

## 2021-11-21 LAB — COMPREHENSIVE METABOLIC PANEL
ALT: 14 U/L (ref 0–44)
AST: 18 U/L (ref 15–41)
Albumin: 3.7 g/dL (ref 3.5–5.0)
Alkaline Phosphatase: 39 U/L (ref 38–126)
Anion gap: 4 — ABNORMAL LOW (ref 5–15)
BUN: 18 mg/dL (ref 6–20)
CO2: 26 mmol/L (ref 22–32)
Calcium: 9.4 mg/dL (ref 8.9–10.3)
Chloride: 108 mmol/L (ref 98–111)
Creatinine, Ser: 0.92 mg/dL (ref 0.44–1.00)
GFR, Estimated: >60 mL/min (ref >60)
Glucose, Bld: 94 mg/dL (ref 70–99)
Potassium: 3.8 mmol/L (ref 3.5–5.1)
Sodium: 138 mmol/L (ref 135–145)
Total Bilirubin: 0.6 mg/dL (ref 0.3–1.2)
Total Protein: 7 g/dL (ref 6.5–8.1)

## 2021-11-21 LAB — URINALYSIS, ROUTINE W REFLEX MICROSCOPIC
Bilirubin Urine: NEGATIVE
Glucose, UA: NEGATIVE mg/dL
Ketones, ur: NEGATIVE mg/dL
Leukocytes,Ua: NEGATIVE
Nitrite: NEGATIVE
Protein, ur: 30 mg/dL — AB
Specific Gravity, Urine: >=1.03 (ref 1.005–1.030)
pH: 5 (ref 5.0–8.0)

## 2021-11-21 LAB — CBC WITH DIFFERENTIAL/PLATELET
Abs Immature Granulocytes: 0 10*3/uL (ref 0.00–0.07)
Basophils Absolute: 0 10*3/uL (ref 0.0–0.1)
Basophils Relative: 1 %
Eosinophils Absolute: 0.2 10*3/uL (ref 0.0–0.5)
Eosinophils Relative: 4 %
HCT: 38.7 % (ref 36.0–46.0)
Hemoglobin: 12.7 g/dL (ref 12.0–15.0)
Immature Granulocytes: 0 %
Lymphocytes Relative: 38 %
Lymphs Abs: 1.5 10*3/uL (ref 0.7–4.0)
MCH: 28.5 pg (ref 26.0–34.0)
MCHC: 32.8 g/dL (ref 30.0–36.0)
MCV: 86.8 fL (ref 80.0–100.0)
Monocytes Absolute: 0.4 10*3/uL (ref 0.1–1.0)
Monocytes Relative: 9 %
Neutro Abs: 1.9 10*3/uL (ref 1.7–7.7)
Neutrophils Relative %: 48 %
Platelets: 311 10*3/uL (ref 150–400)
RBC: 4.46 MIL/uL (ref 3.87–5.11)
RDW: 15.2 % (ref 11.5–15.5)
WBC: 4 10*3/uL (ref 4.0–10.5)
nRBC: 0 % (ref 0.0–0.2)

## 2021-11-21 LAB — HCG, QUANTITATIVE, PREGNANCY: hCG, Beta Chain, Quant, S: 2 m[IU]/mL (ref <5)

## 2021-11-21 MED ORDER — LACTATED RINGERS IV BOLUS
1000.0000 mL | Freq: Once | INTRAVENOUS | Status: AC
  Administered 2021-11-21: 1000 mL via INTRAVENOUS

## 2021-11-21 NOTE — ED Triage Notes (Addendum)
Pt ahs miscarriage on 7/7 and has decreased po intake, fatigue, light headed. Pt reports intermittent fever with continued vaginal bleeding with odor.

## 2021-11-21 NOTE — ED Provider Notes (Signed)
[facility] EMERGENCY DEPARTMENT Provider Note   CSN: [id number] Arrival date & time: 11/21/21  0221     History  Chief Complaint  Patient presents with   Fatigue    Quinnlyn Hearns is a 46 y.o. female.  Here for continuing not to feel well after a spontaneous abortion about a week ago. Still with vaginal bleeding. Seen here about 5 days ago. Went back to work today. States she was slightly light headed, nauseous and didn't feel well. Came here for eval.         Home Medications Prior to Admission medications   Medication Sig Start Date End Date Taking? Authorizing Provider  aspirin EC 81 MG tablet Take 1 tablet (81 mg total) by mouth daily. Swallow whole. 03/21/21   Clarnce Flock, MD  azithromycin (ZITHROMAX) 250 MG tablet Take 250 mg by mouth as directed. 08/13/21   [provider]  clomiPHENE (CLOMID) 50 MG tablet Starting on the fifth day after your period starts, take one tablet daily for five days to help induce ovulation. Repeat with each menstrual cycle. 05/24/21   Clarnce Flock, MD  cyclobenzaprine (FLEXERIL) 10 MG tablet Take 1 tablet (10 mg total) by mouth at bedtime as needed for muscle spasms. 03/02/21   Rayna Sexton, PA-C  fluconazole (DIFLUCAN) 150 MG tablet Take 150 mg by mouth every 3 (three) days. 08/13/21   [provider]  Fluticasone Propionate (FLONASE ALLERGY RELIEF NA) Place into the nose.    [provider]  ibuprofen (ADVIL) 800 MG tablet Take 1 tablet (800 mg total) by mouth 3 (three) times daily. 10/15/20   Garald Balding, PA-C  loratadine (CLARITIN) 10 MG tablet Take 1 tablet by mouth daily.    [provider]  metFORMIN (GLUCOPHAGE) 500 MG tablet Take 1 tablet (500 mg total) by mouth 2 (two) times daily with a meal. 05/24/21   Clarnce Flock, MD  naproxen (NAPROSYN) 500 MG tablet Take 1 tablet (500 mg total) by mouth 2 (two) times daily as needed. 03/02/21   Rayna Sexton, PA-C  ondansetron  (ZOFRAN-ODT) 4 MG disintegrating tablet Take 1 tablet (4 mg total) by mouth every 8 (eight) hours as needed for nausea or vomiting. Patient not taking: Reported on 08/23/2021 05/23/21   Carlisle Cater, PA-C  Prenatal Vit-Fe Fumarate-FA (MULTIVITAMIN-PRENATAL) 27-0.8 MG TABS tablet Take 1 tablet by mouth daily at 12 noon. Patient not taking: Reported on 11/09/2021    [provider]  Prenatal Vit-Fe Fumarate-FA (PRENATAL VITAMINS) 28-0.8 MG TABS Take 1 tablet by mouth daily. 11/09/21   Caren Macadam, MD  valACYclovir (VALTREX) 1000 MG tablet Take 1 tablet (1,000 mg total) by mouth 3 (three) times daily. Patient not taking: Reported on 08/23/2021 05/23/21   Carlisle Cater, PA-C  diphenhydrAMINE (BENADRYL) 12.5 MG/5ML liquid Take 12.5 mg by mouth once.  08/01/11  [provider]      Allergies    Philippa Sicks scolymus (artichoke)] and Prednisone    Review of Systems   Review of Systems  Physical Exam Updated Vital Signs BP 132/81   Pulse 70   Temp 98.3 F (36.8 C) (Oral)   Resp 16   Ht '5\' 4"'$  (1.626 m)   Wt 114 kg   LMP 10/05/2021   SpO2 100%   BMI 43.14 kg/m  Physical Exam Vitals and nursing note reviewed.  Constitutional:      Appearance: She is well-developed.  HENT:     Head: Normocephalic and atraumatic.  Mouth/Throat:     Mouth: Mucous membranes are moist.  Eyes:     Pupils: Pupils are equal, round, and reactive to light.  Cardiovascular:     Rate and Rhythm: Normal rate and regular rhythm.  Pulmonary:     Effort: No respiratory distress.     Breath sounds: No stridor.  Abdominal:     General: Abdomen is flat. There is no distension.  Musculoskeletal:     Cervical back: Normal range of motion.  Skin:    General: Skin is warm and dry.  Neurological:     General: No focal deficit present.     Mental Status: She is alert.     ED Results / Procedures / Treatments   Labs (all labs ordered are listed, but only abnormal results are  displayed) Labs Reviewed  COMPREHENSIVE METABOLIC PANEL - Abnormal; Notable for the following components:      Result Value   Anion gap 4 (*)    All other components within normal limits  URINALYSIS, ROUTINE W REFLEX MICROSCOPIC - Abnormal; Notable for the following components:   APPearance HAZY (*)    Hgb urine dipstick LARGE (*)    Protein, ur 30 (*)    All other components within normal limits  URINALYSIS, MICROSCOPIC (REFLEX) - Abnormal; Notable for the following components:   Bacteria, UA MANY (*)    Non Squamous Epithelial PRESENT (*)    All other components within normal limits  URINE CULTURE  CBC WITH DIFFERENTIAL/PLATELET  HCG, QUANTITATIVE, PREGNANCY    EKG None  Radiology No results found.  Procedures Procedures    Medications Ordered in ED Medications  lactated ringers bolus 1,000 mL (0 mLs Intravenous Stopped 11/21/21 0429)    ED Course/ Medical Decision Making/ A&P                           Medical Decision Making Amount and/or Complexity of Data Reviewed Labs: ordered.   Unclear etiology for symptoms but labs reassuring. No anemia. No dehydration. No indication for repeat imaging. HCG negative. Will fu w/ ob. Work note provided. Expectant management provided.    Final Clinical Impression(s) / ED Diagnoses Final diagnoses:  Other fatigue  Vaginal bleeding    Rx / DC Orders ED Discharge Orders     None         Amiaya Mcneeley, Corene Cornea, MD 11/21/21 [phone]

## 2021-11-22 LAB — URINE CULTURE

## 2021-12-06 ENCOUNTER — Other Ambulatory Visit: Payer: Self-pay

## 2021-12-06 ENCOUNTER — Encounter: Payer: Self-pay | Admitting: Family Medicine

## 2021-12-06 ENCOUNTER — Other Ambulatory Visit (HOSPITAL_COMMUNITY)
Admission: RE | Admit: 2021-12-06 | Discharge: 2021-12-06 | Disposition: A | Discharge status: Home / Self Care | Payer: Medicaid Other | Source: Ambulatory Visit | Attending: Family Medicine | Admitting: Family Medicine

## 2021-12-06 ENCOUNTER — Ambulatory Visit (INDEPENDENT_AMBULATORY_CARE_PROVIDER_SITE_OTHER): Payer: Medicaid Other | Admitting: Family Medicine

## 2021-12-06 VITALS — BP 133/83 | HR 74 | Wt 253.2 lb

## 2021-12-06 DIAGNOSIS — Z3189 Encounter for other procreative management: Secondary | ICD-10-CM

## 2021-12-06 DIAGNOSIS — N898 Other specified noninflammatory disorders of vagina: Secondary | ICD-10-CM | POA: Insufficient documentation

## 2021-12-06 DIAGNOSIS — R300 Dysuria: Secondary | ICD-10-CM | POA: Diagnosis not present

## 2021-12-06 DIAGNOSIS — F4321 Adjustment disorder with depressed mood: Secondary | ICD-10-CM | POA: Diagnosis not present

## 2021-12-06 DIAGNOSIS — O039 Complete or unspecified spontaneous abortion without complication: Secondary | ICD-10-CM

## 2021-12-06 LAB — POCT URINALYSIS DIP (DEVICE)
Bilirubin Urine: NEGATIVE
Glucose, UA: NEGATIVE mg/dL
Hgb urine dipstick: NEGATIVE
Ketones, ur: NEGATIVE mg/dL
Leukocytes,Ua: NEGATIVE
Nitrite: NEGATIVE
Protein, ur: NEGATIVE mg/dL
Specific Gravity, Urine: >=1.03 (ref 1.005–1.030)
Urobilinogen, UA: 0.2 mg/dL (ref 0.0–1.0)
pH: 5.5 (ref 5.0–8.0)

## 2021-12-06 NOTE — Assessment & Plan Note (Signed)
Empathized with patient. Again recommended she be seen by fertility specialist. [place] images from two months ago not best quality but does not appear to have fibroids that significantly distort her endometrial cavity. Discussed how a fertility specialist will have best insight as to what strategies will help her achieve pregnancy. Also discussed financial strategies such as paying for [employer], which may be cheaper than out of pocket costs. Patient in agreement with plan.

## 2021-12-06 NOTE — Assessment & Plan Note (Signed)
Referral made to [facility]

## 2021-12-06 NOTE — Progress Notes (Signed)
GYNECOLOGY OFFICE VISIT NOTE  History:   Melissa [name] is a 46 y.o. [phone] here today for miscarriage follow up.  Seen in ED earlier this month with positive hcg of 18 On follow up check had dropped back down to normal Seen by myself on several prior occasions regarding consultations for fertility, recommended she go to fertility specialist Also saw Dr. Elgie Congo on 09/22/2021 to discuss possible impact of fibroids, US showed fibroids but not yet seen for follow up visit  Today reports she is very disappointed with result but not surprised Has not yet but is planning to go see a fertility specialist Still having some intermittent cramping but no longer bleeding Would also like urine and swabs sent Would also like to see someone to process this miscarriage as well  Health Maintenance Due  Topic Date Due   COVID-19 Vaccine (1) Never done    Past Medical History:  Diagnosis Date   Abnormal Pap smear 08/30/2008   LGSIL   Allergy    BV (bacterial vaginosis)    recurring   Chlamydia    Chronic pain of left knee    Ectopic pregnancy    Fibroid    Infertility    Secondary PCOS   PCOS (polycystic ovarian syndrome)    Shingles outbreak 11/2011   Vertigo     Past Surgical History:  Procedure Laterality Date   APPENDECTOMY     DILATION AND CURETTAGE OF UTERUS  06/2011   ectopic   EAR TUBE REMOVAL     ECTOPIC PREGNANCY SURGERY     LAPAROSCOPY  07/05/2011   Procedure: LAPAROSCOPY OPERATIVE;  Surgeon: Osborne Oman, MD;  Location: [facility];  Service: Gynecology;  Laterality: N/A;   right ear surgery     WISDOM TOOTH EXTRACTION      The following portions of the patient's history were reviewed and updated as appropriate: allergies, current medications, past family history, past medical history, past social history, past surgical history and problem list.   Health Maintenance:   Last pap: Lab Results  Component Value Date   DIAGPAP  03/16/2019    - Negative for  intraepithelial lesion or malignancy (NILM)   [place] Negative 03/16/2019    Last mammogram:  09/20/2021 - BIRADS 1    Review of Systems:  Pertinent items noted in HPI and remainder of comprehensive ROS otherwise negative.  Physical Exam:  BP 133/83   Pulse 74   Wt 253 lb 3.2 oz (114.9 kg)   LMP 10/05/2021   Breastfeeding Unknown   BMI 43.46 kg/m  CONSTITUTIONAL: Well-developed, well-nourished female in no acute distress.  HEENT:  Normocephalic, atraumatic. External right and left ear normal. No scleral icterus.  NECK: Normal range of motion, supple, no masses noted on observation SKIN: No rash noted. Not diaphoretic. No erythema. No pallor. MUSCULOSKELETAL: Normal range of motion. No edema noted. NEUROLOGIC: Alert and oriented to person, place, and time. Normal muscle tone coordination.  PSYCHIATRIC: Normal mood and affect. Normal behavior. Normal judgment and thought content. RESPIRATORY: Effort normal, no problems with respiration noted   Labs and Imaging Results for orders placed or performed in visit on 12/06/21 (from the past 168 hour(s))  POCT urinalysis dip (device)   Collection Time: 12/06/21  3:05 PM  Result Value Ref Range   Glucose, UA NEGATIVE NEGATIVE mg/dL   Bilirubin Urine NEGATIVE NEGATIVE   Ketones, ur NEGATIVE NEGATIVE mg/dL   Specific Gravity, Urine >=1.030 1.005 - 1.030   Hgb urine dipstick NEGATIVE NEGATIVE  pH 5.5 5.0 - 8.0   Protein, ur NEGATIVE NEGATIVE mg/dL   Urobilinogen, UA 0.2 0.0 - 1.0 mg/dL   Nitrite NEGATIVE NEGATIVE   Leukocytes,Ua NEGATIVE NEGATIVE   US OB LESS THAN 14 WEEKS WITH OB TRANSVAGINAL  Result Date: 11/15/2021 CLINICAL DATA:  Vaginal bleeding. EXAM: OBSTETRIC <14 WK [place] AND TRANSVAGINAL OB US TECHNIQUE: Both transabdominal and transvaginal ultrasound examinations were performed for complete evaluation of the gestation as well as the maternal uterus, adnexal regions, and pelvic cul-de-sac. Transvaginal technique was performed to  assess early pregnancy. COMPARISON:  None Available. FINDINGS: Intrauterine gestational sac: None Yolk sac:  Not Visualized. Embryo:  Not Visualized. Cardiac Activity: Not Visualized. Heart Rate: N/A  bpm Maternal uterus/adnexae: Multiple heterogeneous uterine fibroids of various sizes are seen. The right and left ovaries are not clearly visualized. No pelvic free fluid is seen. IMPRESSION: 1. No evidence of an intrauterine pregnancy. Correlation with follow-up pelvic ultrasound and serial beta HCG levels is recommended if this remains of clinical concern. 2. Multiple heterogeneous uterine fibroids. Electronically Signed   By: Virgina Norfolk M.D.   On: 11/15/2021 02:32      Assessment and Plan:   Problem List Items Addressed This Visit       Other   Encounter for fertility planning    Empathized with patient. Again recommended she be seen by fertility specialist. [place] images from two months ago not best quality but does not appear to have fibroids that significantly distort her endometrial cavity. Discussed how a fertility specialist will have best insight as to what strategies will help her achieve pregnancy. Also discussed financial strategies such as paying for [employer], which may be cheaper than out of pocket costs. Patient in agreement with plan.       Grief reaction    Referral made to [facility]      Relevant Orders   Ambulatory referral to [facility]   Other Visit Diagnoses     Miscarriage    -  Primary   Dysuria       Relevant Orders   Urine Culture   Vaginal discharge       Relevant Orders   Cervicovaginal ancillary only( [place])       Return as needed.    Total face-to-face time with patient: 25 minutes.  Over 50% of encounter was spent on counseling and coordination of care.   Clarnce Flock, MD/MPH Attending Family Medicine Physician, [facility] for [facility], [place]

## 2021-12-07 ENCOUNTER — Encounter: Payer: Self-pay | Admitting: *Deleted

## 2021-12-07 ENCOUNTER — Encounter: Payer: Self-pay | Admitting: Family Medicine

## 2021-12-07 LAB — CERVICOVAGINAL ANCILLARY ONLY
Bacterial Vaginitis (gardnerella): POSITIVE — AB
Candida Glabrata: NEGATIVE
Candida Vaginitis: NEGATIVE
Chlamydia: NEGATIVE
Comment: NEGATIVE
Comment: NEGATIVE
Comment: NEGATIVE
Comment: NEGATIVE
Comment: NEGATIVE
Comment: NORMAL
Neisseria Gonorrhea: NEGATIVE
Trichomonas: NEGATIVE

## 2021-12-07 MED ORDER — METRONIDAZOLE 500 MG PO TABS: 500.0000 mg | ORAL_TABLET | Freq: Two times a day (BID) | ORAL | 0 refills | Status: AC

## 2021-12-07 NOTE — BH Specialist Note (Unsigned)
Integrated Behavioral Health via Telemedicine Visit  12/21/2021 Melissa [name] [id number]  Number of Integrated Behavioral Health Clinician visits: 1- Initial Visit  Session Start time: [phone]   Session End time: 0910  Total time in minutes: 54   Referring Provider: Clayton Lefort, MD Patient/Family [facility] Provider location: [facility] [place] [facility]  All persons participating in visit: [name] and [place]   Types of Service: Individual psychotherapy and Video visit  I connected with Melissa [name] and/or Melissa [name]  n/a  via  Telephone or Video Enabled Telemedicine Application  (Video is Caregility application) and verified that I am speaking with the correct person using two identifiers. Discussed confidentiality: Yes   I discussed the limitations of telemedicine and the availability of in person appointments.  Discussed there is a possibility of technology failure and discussed alternative modes of communication if that failure occurs.  I discussed that engaging in this telemedicine visit, they consent to the provision of behavioral healthcare and the services will be billed under their insurance.  Patient and/or legal guardian expressed understanding and consented to Telemedicine visit: Yes   Presenting Concerns: Patient and/or family reports the following symptoms/concerns: Overwhelming fatigue, poor sleep, poor appetite, depression, anxiety related to difficult work conditions and grieving pregnancy loss. Pt's workplace is requiring a note recommending time off work; pt has been having heart palpitations, excessive sweating, sharp pain up her back, lower back pain, headaches, large blood clots, heavy brown bleeding (heavy enough to use child's pull up, instead of pad). Also concerned about finances, as unable to work much postpartum with above health issues.  Duration of problem: Postpartum;  Severity of problem:  moderately severe  Patient and/or Family's Strengths/Protective Factors: Social connections and Sense of purpose  Goals Addressed: Patient will:  Reduce symptoms of: anxiety, depression, insomnia, and stress   Increase knowledge and/or ability of: stress reduction   Demonstrate ability to: Increase healthy adjustment to current life circumstances and Continue healthy grieving over loss  Progress towards Goals: Ongoing  Interventions: Interventions utilized:  Solution-Focused Strategies, Psychoeducation and/or Health Education, Link to [employer], and Supportive Reflection Standardized Assessments completed: Not Needed  Patient and/or Family Response: Patient agrees with treatment plan.   Assessment: Patient currently experiencing Grief.   Patient may benefit from psychoeducation and brief therapeutic interventions regarding coping with symptoms of depression, anxiety, insomnia, grief .  Plan: Follow up with behavioral health clinician on : Two weeks Behavioral recommendations:  -Continue taking prenatal vitamin until postpartum visit --Continue prioritizing healthy self-care (regular meals, adequate rest; allowing practical help from supportive friends and family) until at least postpartum medical appointment -Consider pregnancy loss support group as needed at either [email or web address].net or [email or web address]  -CALM relaxation breathing exercise twice daily (morning; at bedtime with sleep sounds); as needed throughout the day, for as long as needed -Accept referrals to [facility] and Melissa [name] -Continue plan immediately after this visit to: watch show, Dr Lorrin Mais lavendar, rest/sleep   Referral(s): [place] (In Clinic) and [employer]:  pregnancy loss support, [facility], Melissa [name]  I discussed the assessment and treatment plan with the patient and/or parent/guardian. They were provided an opportunity to ask questions and all were  answered. They agreed with the plan and demonstrated an understanding of the instructions.   They were advised to call back or seek an in-person evaluation if the symptoms worsen or if the condition fails to improve as anticipated.  Caroleen Hamman [place], LCSW  12/06/2021    3:24 PM 09/22/2021    9:34 AM 08/23/2021    5:38 PM 03/21/2021    1:27 PM 08/01/2016    1:45 PM  Depression screen PHQ 2/9  Decreased Interest '1 2 1 '$ 0 0  Down, Depressed, Hopeless '2 2 1 '$ 0 0  PHQ - 2 Score '3 4 2 '$ 0 0  Altered sleeping '2 2 1 '$ 0 2  Tired, decreased energy '2 2 1 '$ 0 2  Change in appetite '2 2 1 '$ 0 2  Feeling bad or failure about yourself  2 2 0 0 0  Trouble concentrating 0 1 0 0 0  Moving slowly or fidgety/restless 0 0 0 0 0  Suicidal thoughts 0 0 0 0 0  PHQ-9 Score '11 13 5 '$ 0 6  Difficult doing work/chores    Not difficult at all       12/06/2021    3:25 PM 09/22/2021    9:34 AM 08/23/2021    5:38 PM 03/21/2021    1:27 PM  GAD 7 : Generalized Anxiety Score  Nervous, Anxious, on Edge '1 2 1 '$ 0  Control/stop worrying '2 2 1 '$ 0  Worry too much - different things '3 2 1 1  '$ Trouble relaxing 2 2 0 0  Restless 1 1 0 0  Easily annoyed or irritable 1 1 0 0  Afraid - awful might happen 0 1 0 0  Total GAD 7 Score '10 11 3 1  '$ Anxiety Difficulty    Not difficult at all

## 2021-12-07 NOTE — Addendum Note (Signed)
Addended by: Clayton Lefort on: 12/07/2021 01:15 PM   Modules accepted: Orders

## 2021-12-08 LAB — URINE CULTURE

## 2021-12-09 ENCOUNTER — Other Ambulatory Visit: Payer: Self-pay

## 2021-12-09 ENCOUNTER — Encounter (HOSPITAL_BASED_OUTPATIENT_CLINIC_OR_DEPARTMENT_OTHER): Payer: Self-pay | Admitting: Emergency Medicine

## 2021-12-09 ENCOUNTER — Emergency Department (HOSPITAL_BASED_OUTPATIENT_CLINIC_OR_DEPARTMENT_OTHER): Payer: Medicaid Other

## 2021-12-09 ENCOUNTER — Emergency Department (HOSPITAL_BASED_OUTPATIENT_CLINIC_OR_DEPARTMENT_OTHER)
Admission: EM | Admit: 2021-12-09 | Discharge: 2021-12-10 | Disposition: A | Discharge status: Home / Self Care | Payer: Medicaid Other | Attending: Emergency Medicine | Admitting: Emergency Medicine

## 2021-12-09 DIAGNOSIS — Z7982 Long term (current) use of aspirin: Secondary | ICD-10-CM | POA: Insufficient documentation

## 2021-12-09 DIAGNOSIS — R002 Palpitations: Secondary | ICD-10-CM | POA: Insufficient documentation

## 2021-12-09 DIAGNOSIS — Z7984 Long term (current) use of oral hypoglycemic drugs: Secondary | ICD-10-CM | POA: Diagnosis not present

## 2021-12-09 LAB — BASIC METABOLIC PANEL
Anion gap: 5 (ref 5–15)
BUN: 15 mg/dL (ref 6–20)
CO2: 25 mmol/L (ref 22–32)
Calcium: 8.8 mg/dL — ABNORMAL LOW (ref 8.9–10.3)
Chloride: 106 mmol/L (ref 98–111)
Creatinine, Ser: 0.9 mg/dL (ref 0.44–1.00)
GFR, Estimated: >60 mL/min (ref >60)
Glucose, Bld: 87 mg/dL (ref 70–99)
Potassium: 3.6 mmol/L (ref 3.5–5.1)
Sodium: 136 mmol/L (ref 135–145)

## 2021-12-09 LAB — CBC
HCT: 39.7 % (ref 36.0–46.0)
Hemoglobin: 13.1 g/dL (ref 12.0–15.0)
MCH: 28.5 pg (ref 26.0–34.0)
MCHC: 33 g/dL (ref 30.0–36.0)
MCV: 86.5 fL (ref 80.0–100.0)
Platelets: 335 10*3/uL (ref 150–400)
RBC: 4.59 MIL/uL (ref 3.87–5.11)
RDW: 15.4 % (ref 11.5–15.5)
WBC: 4.1 10*3/uL (ref 4.0–10.5)
nRBC: 0 % (ref 0.0–0.2)

## 2021-12-09 LAB — PREGNANCY, URINE: Preg Test, Ur: NEGATIVE

## 2021-12-09 LAB — D-DIMER, QUANTITATIVE: D-Dimer, Quant: 0.64 ug/mL-FEU — ABNORMAL HIGH (ref 0.00–0.50)

## 2021-12-09 LAB — TROPONIN I (HIGH SENSITIVITY): Troponin I (High Sensitivity): <2 ng/L (ref <18)

## 2021-12-09 NOTE — ED Triage Notes (Signed)
Palpitations with SOB X2-3 days. Started after miscarriage.

## 2021-12-10 LAB — TSH: TSH: 1.347 u[IU]/mL (ref 0.350–4.500)

## 2021-12-10 MED ORDER — HYDROXYZINE HCL 25 MG PO TABS: 25.0000 mg | ORAL_TABLET | Freq: Three times a day (TID) | ORAL | 0 refills | Status: DC | PRN

## 2021-12-10 NOTE — ED Provider Notes (Signed)
Melissa [name]   CSN: [id number] Arrival date & time: 12/09/21  2104     History  Chief Complaint  Patient presents with   Palpitations    Melissa [name] is a 46 y.o. female.  The history is provided by the patient and medical records.  Palpitations Melissa [name] is a 46 y.o. female who presents to the Emergency Department complaining of palpitations.  She presents to the emergency department for evaluation of palpitations that started a few days ago.  She feels like her heart is beating irregular at times.  She did have a miscarriage on July 5 and states that she has been stressed since that happened because her job has been threatened.  She also reports occasional headache and difficulty relaxing.  No associated chest pain, fever, shortness of breath, leg swelling or pain.  No nausea, vomiting, diarrhea.  She does state that her sleep is off because she works nights and feels somewhat depressed but is still sleeping.  Minimal caffeine.  No hormones since jan No hx/o dvt/pe.    Family hx/o DM, HTN, cancer - breast (aunt x2), brain (brother), colon (dad), cva.   No family hx/o clotting disorder.       Home Medications Prior to Admission medications   Medication Sig Start Date End Date Taking? Authorizing Provider  aspirin EC 81 MG tablet Take 1 tablet (81 mg total) by mouth daily. Swallow whole. 03/21/21   Clarnce Flock, MD  azithromycin (ZITHROMAX) 250 MG tablet Take 250 mg by mouth as directed. 08/13/21   [provider]  clomiPHENE (CLOMID) 50 MG tablet Starting on the fifth day after your period starts, take one tablet daily for five days to help induce ovulation. Repeat with each menstrual cycle. 05/24/21   Clarnce Flock, MD  cyclobenzaprine (FLEXERIL) 10 MG tablet Take 1 tablet (10 mg total) by mouth at bedtime as needed for muscle spasms. 03/02/21   Rayna Sexton, PA-C  fluconazole (DIFLUCAN) 150 MG tablet Take  150 mg by mouth every 3 (three) days. Patient not taking: Reported on 12/06/2021 08/13/21   [provider]  Fluticasone Propionate (FLONASE ALLERGY RELIEF NA) Place into the nose.    [provider]  ibuprofen (ADVIL) 800 MG tablet Take 1 tablet (800 mg total) by mouth 3 (three) times daily. 10/15/20   Garald Balding, PA-C  loratadine (CLARITIN) 10 MG tablet Take 1 tablet by mouth daily.    [provider]  metFORMIN (GLUCOPHAGE) 500 MG tablet Take 1 tablet (500 mg total) by mouth 2 (two) times daily with a meal. 05/24/21   Clarnce Flock, MD  metroNIDAZOLE (FLAGYL) 500 MG tablet Take 1 tablet (500 mg total) by mouth 2 (two) times daily for 7 days. 12/07/21 12/14/21  Clarnce Flock, MD  naproxen (NAPROSYN) 500 MG tablet Take 1 tablet (500 mg total) by mouth 2 (two) times daily as needed. 03/02/21   Rayna Sexton, PA-C  ondansetron (ZOFRAN-ODT) 4 MG disintegrating tablet Take 1 tablet (4 mg total) by mouth every 8 (eight) hours as needed for nausea or vomiting. 05/23/21   Carlisle Cater, PA-C  Prenatal Vit-Fe Fumarate-FA (MULTIVITAMIN-PRENATAL) 27-0.8 MG TABS tablet Take 1 tablet by mouth daily at 12 noon. Patient not taking: Reported on 11/09/2021    [provider]  Prenatal Vit-Fe Fumarate-FA (PRENATAL VITAMINS) 28-0.8 MG TABS Take 1 tablet by mouth daily. Patient not taking: Reported on 12/06/2021 11/09/21   Caren Macadam, MD  valACYclovir (  VALTREX) 1000 MG tablet Take 1 tablet (1,000 mg total) by mouth 3 (three) times daily. 05/23/21   Carlisle Cater, PA-C  diphenhydrAMINE (BENADRYL) 12.5 MG/5ML liquid Take 12.5 mg by mouth once.  08/01/11  [provider]      Allergies    Philippa Sicks scolymus (artichoke)] and Prednisone    Review of Systems   Review of Systems  Cardiovascular:  Positive for palpitations.  All other systems reviewed and are negative.   Physical Exam Updated Vital Signs BP 124/83 (BP Location: Right Arm)    Pulse 95   Temp 99.4 F (37.4 C) (Oral)   Resp 16   Ht '5\' 4"'$  (1.626 m)   Wt 114.8 kg   LMP 10/05/2021 Comment: miscarriage 3 wks ago  SpO2 98%   Breastfeeding No   BMI 43.43 kg/m  Physical Exam Vitals and nursing [name] reviewed.  Constitutional:      Appearance: She is well-developed.  HENT:     Head: Normocephalic and atraumatic.  Cardiovascular:     Rate and Rhythm: Normal rate and regular rhythm.     Heart sounds: No murmur heard. Pulmonary:     Effort: Pulmonary effort is normal. No respiratory distress.     Breath sounds: Normal breath sounds.  Abdominal:     Palpations: Abdomen is soft.     Tenderness: There is no abdominal tenderness. There is no guarding or rebound.  Musculoskeletal:        General: No swelling or tenderness.  Skin:    General: Skin is warm and dry.  Neurological:     Mental Status: She is alert and oriented to person, place, and time.  Psychiatric:        Behavior: Behavior normal.     ED Results / Procedures / Treatments   Labs (all labs ordered are listed, but only abnormal results are displayed) Labs Reviewed  BASIC METABOLIC PANEL - Abnormal; Notable for the following components:      Result Value   Calcium 8.8 (*)    All other components within normal limits  D-DIMER, QUANTITATIVE - Abnormal; Notable for the following components:   D-Dimer, Quant 0.64 (*)    All other components within normal limits  CBC  PREGNANCY, URINE  TSH  TROPONIN I (HIGH SENSITIVITY)  TROPONIN I (HIGH SENSITIVITY)    EKG EKG Interpretation  Date/Time:  Saturday December 09 2021 21:15:27 EDT Ventricular Rate:  77 PR Interval:  174 QRS Duration: 82 QT Interval:  364 QTC Calculation: 411 R Axis:   39 Text Interpretation: Normal sinus rhythm Normal ECG Confirmed by Quintella Reichert [phone]) on 12/09/2021 11:14:38 PM  Radiology DG Chest 2 View  Result Date: 12/09/2021 CLINICAL DATA:  Cardiac palpitations and shortness of breath for several days, initial  encounter EXAM: CHEST - 2 VIEW COMPARISON:  07/17/2021 FINDINGS: The heart size and mediastinal contours are within normal limits. Both lungs are clear. The visualized skeletal structures are unremarkable. IMPRESSION: No active cardiopulmonary disease. Electronically Signed   By: Inez Catalina M.D.   On: 12/09/2021 21:34    Procedures Procedures    Medications Ordered in ED Medications - No data to display  ED Course/ Medical Decision Making/ A&P                           Medical Decision Making Amount and/or Complexity of Data Reviewed Labs: ordered. Radiology: ordered.  Risk Prescription drug management.   Patient here for evaluation  of palpitations following recent miscarriage, no significant medical history.  EKG was sinus rhythm.  Labs are reassuring, patient's not currently pregnant.  No significant electrolyte abnormality.  Troponin is negative.  D-dimer was ordered in triage.  Currently patient is PERC negative and does not have historical or examination features concerning for pulmonary embolism or DVT.  Do not feel that additional work-up for DVT/PE is warranted at this juncture.  Discussed this with the patient.  Feel she is stable at this point for discharge home with close outpatient follow-up as well as return precautions.  Current clinical picture is not consistent with cardiomyopathy, ACS.        Final Clinical Impression(s) / ED Diagnoses Final diagnoses:  Palpitations    Rx / DC Orders ED Discharge Orders     None         Quintella Reichert, MD 12/10/21 [phone]

## 2021-12-10 NOTE — ED Notes (Signed)
Reviewed AVS/discharge instruction with patient. Time allotted for and all questions answered. Patient is agreeable for d/c and escorted to ed exit by staff.  

## 2021-12-11 ENCOUNTER — Telehealth (HOSPITAL_BASED_OUTPATIENT_CLINIC_OR_DEPARTMENT_OTHER): Payer: Self-pay | Admitting: Emergency Medicine

## 2021-12-11 ENCOUNTER — Encounter: Payer: Medicaid Other | Admitting: Obstetrics and Gynecology

## 2021-12-11 ENCOUNTER — Telehealth: Payer: Self-pay

## 2021-12-11 NOTE — Patient Outreach (Signed)
Care Coordination  12/11/2021  Chardai Gangemi 29-Oct-1975 [id number]  Transition Care Management Unsuccessful Follow-up Telephone Call  Date of discharge and from where:  12/10/21 [facility]   Attempts:  1st Attempt  Reason for unsuccessful TCM follow-up call:  Unable to reach patient

## 2021-12-12 ENCOUNTER — Ambulatory Visit: Payer: Medicaid Other | Admitting: Family Medicine

## 2021-12-13 ENCOUNTER — Telehealth: Payer: Self-pay | Admitting: *Deleted

## 2021-12-13 NOTE — Patient Outreach (Signed)
Care Coordination  12/13/2021  Anijah Spohr 1976/05/03 [id number]  Transition Care Management Unsuccessful Follow-up Telephone Call  Date of discharge and from where:  12/10/21, [facility]  Attempts:  2nd Attempt  Reason for unsuccessful TCM follow-up call:  Unable to reach patient   [name] RN, [place] RN Care Coordinator

## 2021-12-14 ENCOUNTER — Telehealth: Payer: Medicaid Other

## 2021-12-14 ENCOUNTER — Telehealth: Payer: Self-pay | Admitting: Obstetrics and Gynecology

## 2021-12-14 NOTE — Patient Outreach (Signed)
Transition Care Management Unsuccessful Follow-up Telephone Call  Date of discharge and from where:  12/10/21 from [facility]  Attempts:  3rd Attempt  Reason for unsuccessful TCM follow-up call:  Unable to reach patient/number not in service  Aida Raider RN, [place] Management Coordinator - Managed [place] [facility] [phone]

## 2021-12-18 ENCOUNTER — Telehealth: Payer: Self-pay | Admitting: Lactation Services

## 2021-12-18 NOTE — Telephone Encounter (Signed)
Patient called into front desk with concerns.   Returned patients call, mobile number is not in service. When calling the home number, received message call cannot be completed. Will send My Chart message.

## 2021-12-21 ENCOUNTER — Ambulatory Visit: Payer: Medicaid Other | Admitting: Clinical

## 2021-12-21 DIAGNOSIS — F4321 Adjustment disorder with depressed mood: Secondary | ICD-10-CM

## 2021-12-21 DIAGNOSIS — Z658 Other specified problems related to psychosocial circumstances: Secondary | ICD-10-CM

## 2021-12-22 ENCOUNTER — Telehealth: Payer: Self-pay | Admitting: Family Medicine

## 2021-12-22 NOTE — Telephone Encounter (Signed)
Patient called in stating she is still needing a letter for work, she did not want to speak with an ruse because with the situation being so sensitive she does not want too many hands in on her situation.

## 2021-12-25 ENCOUNTER — Encounter: Payer: Self-pay | Admitting: Family Medicine

## 2021-12-25 NOTE — Telephone Encounter (Signed)
Letter created by [facility] following recommendations from [place], MD. Pt will pick up tomorrow 12/26/21.

## 2021-12-28 ENCOUNTER — Ambulatory Visit: Payer: Medicaid Other | Admitting: Clinical

## 2021-12-28 ENCOUNTER — Encounter: Payer: Self-pay | Admitting: General Practice

## 2021-12-28 ENCOUNTER — Other Ambulatory Visit: Payer: Self-pay

## 2021-12-28 ENCOUNTER — Ambulatory Visit (INDEPENDENT_AMBULATORY_CARE_PROVIDER_SITE_OTHER): Payer: Medicaid Other | Admitting: Obstetrics and Gynecology

## 2021-12-28 ENCOUNTER — Encounter: Payer: Self-pay | Admitting: Obstetrics and Gynecology

## 2021-12-28 DIAGNOSIS — F4321 Adjustment disorder with depressed mood: Secondary | ICD-10-CM

## 2021-12-28 DIAGNOSIS — Z658 Other specified problems related to psychosocial circumstances: Secondary | ICD-10-CM

## 2021-12-28 DIAGNOSIS — N938 Other specified abnormal uterine and vaginal bleeding: Secondary | ICD-10-CM

## 2021-12-28 NOTE — BH Specialist Note (Signed)
Integrated Behavioral Health Follow Up In-Person Visit  MRN: [id number] Name: Melissa [name]  Number of [place] Clinician visits: 2- Second Visit  Session Start time: 9093   Session End time: 1140  Total time in minutes: 10   Types of Service: Individual psychotherapy  Interpretor:No. Interpretor Name and Language: n/a  Brief f/u in exam room regarding current life stress and health issues.   Caroleen Hamman Tyanne Derocher, LCSW

## 2021-12-28 NOTE — Patient Instructions (Signed)
[facility]  [address], [place], [place] [address] [phone] or [phone] [facility] 24/7 FOR ANYONE [address] [place], [place] Fax: [phone] [email or web address] *Interpreters available *Accepts all insurance and uninsured for Urgent Care needs *Accepts Medicaid and uninsured for outpatient treatment (below)    ONLY FOR [facility]  Below:   Outpatient New Patient Assessment/Therapy Walk-ins:        Monday -Thursday 8am until slots are full.        Every Friday 1pm-4pm  (first come, first served)                   New Patient Psychiatry/Medication Management        Monday-Friday 8am-11am (first come, first served)              For all walk-ins we ask that you arrive by 7:15am, because patients will be seen in the order of arrival.

## 2021-12-29 DIAGNOSIS — N938 Other specified abnormal uterine and vaginal bleeding: Secondary | ICD-10-CM | POA: Insufficient documentation

## 2021-12-29 NOTE — Progress Notes (Signed)
Ms Melissa [name] present to with c/o [facility] with her LMP following recent SAB She denies any bleeding today No bowel or bladder dysfunction  PE AF VSS Lungs clear Heart RRR Abd soft + BS  A/P Irregular cycle x 1  Pt reassured that first cycle after a SAB is usually different. No work up at this time unless irregular cycles become consistent. Pt verbalized understand and will keep a menses calendar and F/U PRN

## 2022-01-01 NOTE — BH Specialist Note (Deleted)
Integrated Behavioral Health Follow Up In-Person Visit  MRN: [id number] Name: Melissa [name]  Number of [place] Clinician visits: 2- Second Visit  Session Start time: 6333   Session End time: 1140  Total time in minutes: 10   Types of Service: {CHL AMB TYPE OF SERVICE:8314111866}  Interpretor:No. Interpretor Name and Language: n/a  Subjective: Melissa [name] is a 46 y.o. female accompanied by {Patient accompanied by:[phone]} Patient was referred by Clayton Lefort, MD for grief after miscarriage. Patient reports the following symptoms/concerns: *** Duration of problem: ***; Severity of problem: {Mild/Moderate/Severe:20260}  Objective: Mood: {BHH MOOD:22306} and Affect: {BHH AFFECT:22307} Risk of harm to self or others: {CHL AMB BH Suicide Current Mental Status:21022748}  Life Context: Family and Social: *** School/Work: *** Self-Care: *** Life Changes: Recent pregnancy loss and car break in ***  Patient and/or Family's Strengths/Protective Factors: Social connections and Sense of purpose ***  Goals Addressed: Patient will:  Reduce symptoms of: anxiety, depression, insomnia, and stress   Increase knowledge and/or ability of: {IBH Patient Tools:21014057}   Demonstrate ability to: {IBH Goals:21014053}  Progress towards Goals: Ongoing  Interventions: Interventions utilized:  {IBH Interventions:21014054} Standardized Assessments completed: {IBH Screening Tools:21014051}  Patient and/or Family Response: Patient agrees with treatment plan.   Patient Centered Plan: Patient is on the following Treatment Plan(s): IBH Assessment: Patient currently experiencing Grief ***.   Patient may benefit from continued therapeutic interventions.  Plan: Follow up with behavioral health clinician on : *** Behavioral recommendations:  -*** -***self-care, loss support?, calm, show/dr teals*** Referral(s): {IBH Referrals:21014055}  Lemya Greenwell C Sian Rockers,  LCSW

## 2022-01-07 ENCOUNTER — Encounter: Payer: Self-pay | Admitting: Radiology

## 2022-01-10 ENCOUNTER — Encounter: Payer: Self-pay | Admitting: Family Medicine

## 2022-01-12 ENCOUNTER — Ambulatory Visit: Payer: Medicaid Other | Admitting: Family Medicine

## 2022-01-12 ENCOUNTER — Ambulatory Visit: Payer: Self-pay

## 2022-01-16 ENCOUNTER — Ambulatory Visit (INDEPENDENT_AMBULATORY_CARE_PROVIDER_SITE_OTHER): Payer: Medicaid Other | Admitting: Clinical

## 2022-01-16 DIAGNOSIS — F4321 Adjustment disorder with depressed mood: Secondary | ICD-10-CM

## 2022-01-16 DIAGNOSIS — F430 Acute stress reaction: Secondary | ICD-10-CM

## 2022-01-16 DIAGNOSIS — Z658 Other specified problems related to psychosocial circumstances: Secondary | ICD-10-CM

## 2022-01-16 NOTE — Patient Instructions (Signed)
Center for [facility] at [facility] for Women [place], [place] [address] [phone] (main office) [phone] [facility] office)  [place] (serves [place], [place], [place], [place], [place], [place], [place], [place], [place], [place], [place], [place], and [place] counties) [address], [place], [place] [address] [phone] http://dawson-may.com/  **Rental assistance, Home Rehabilitation,Weatherization Assistance Program, Forensic psychologist, Housing Voucher Program  [employer] for Housing and Commercial Metals Company Studies: Chief Financial Officer Resources to residents of [place], [place], and [place] Make sure you have your documents ready, including:  (Household income verification: 2 months pay stubs, unemployment/social security award letter, statement of no income for all household members over 20) Photo ID for all household members over 18 Utility Bill/Rent Ledger/Lease: must show past due amount for utilities/rent, or the rental agreement if rent is current 2. Start your application online or by paper (in [place] or [place]) at:     http://boyd-evans.org/  3. Once you have completed the online application, you will get an email confirmation message from the county. Expect to hear back by phone or email at least 6-10 weeks from submitting your application.  4. While you wait:  Call [phone] to check in on your application Let your landlord know that you've applied. Your landlord will be asked to submit documents (W-9) during this application process. Payments will be made directly to the landlord/property [place] or utility assistance for [employer], [facility], and [place] at https://rb.gy/dvxbfv Questions? Call or email Melissa [name] at  [phone] or [name]   Eviction Mediation Program: The HOPE Program Https://www.rebuild.http://mills-williams.net/ HOPE Progam serves low-income renters in [place] counties, defined as less than or equal to 80% of the area median income for the county where the renter lives. In the following 12 counties, you should apply to your local rent and utility assistance program INSTEAD OF the HOPE Program: [place], [place], [place], [place], [place], [place], [place], [place], [place], [place], [place]  If you live outside of [place], contact [place] call center at [phone] to talk to a Program Representative Monday-Friday, 8am-5pm Note that Native American tribes also received federal funding for rent and utility assistance programs. Recognized members of the following tribes will be served by programs managed by tribal governments, including: [place] Band of Cherokee Indians, [place], [place], [place] of [place] and [place], [place], [phone] Melissa [name] .[place], [phone] [email or web address]'[email or web address] .[place] Schleicher [address] [place], [place] [address] [phone] www.gha-[place].[facility] [address] Lin [place], [place] [address] [phone] https://manning.com/ **Programs include: Engineer, building services and Housing Counseling, Healthy Doctor, general practice, Homeless Prevention and [place] [address] [place], [place], [place] [address] [phone] www.https://www.farmer-stevens.info/ **housing applications/recertification; tax payment relief/exemption under specific qualifications  [facility] [address] [place], [place] [address] www.onlinegreensboro.com/~maryshouse **transitional housing for  women in recovery who have minor children or are pregnant  [place], [place], [place] [address] ArtistMovie.se  **emergency shelter and support services for families facing homelessness  [place] [address] [place], [place] [address] [phone] www.youthfocus.org **transitional housing to pregnant women; emergency housing for youth who have run away, are experiencing a family  crisis, are victims of abuse or neglect, or are homeless  [facility] [address], [place], [place] [address] [phone] ircgso.org **Drop-in center for people experiencing homelessness; overnight warming center when temperature is 25 degrees or below  Re-Entry Staffing [place], [place], [place] [address] [phone] https://reentrystaffingagency.org/ **help with affordable housing to people experiencing homelessness or unemployment due to incarceration  [facility] [address] [place], [place] [address] [phone] www.greensborourbanministry.org  **emergency and transitional housing, rent/mortgage assistance, utility assistance  [facility] [address], [place], [place] [address] [phone] www.salvationarmyofgreensboro.org **emergency and transitional housing  Habitat for [employer] [place], [place], [place] [address] [phone] Www.habitatgreensboro.[place], [place], [place] [address] [phone] https://chshousing.org **[place] and [facility]  [facility]ousing [facility]ants [facility] [address] [place] [address], [place], [place] [address] [phone] arrivance.com **home buyer education courses, foreclosure prevention  [facility] [address] [place], [place] [address] [phone] WirelessNovelties.no **Environmental Exposure Assessment (investigation of homes where either children or pregnant women with a confirmed elevated blood lead level reside)  [facility] [place], [place], [place] [address] [phone] http://www.perez.com/ **Home Expense Assistance/Repairs Program; offers home accessibility updates, such as ramps or bars in the bathroom  Self-Help [facility] [address] [place], [place] [address] [phone] https://www.self-help.org/locations/[place] on [place]-branch **Offers credit-building and banking services to people unable to use [place] of [facility] [place], [place], [place] [address] [phone] RoulettePays.com.br   [facility] [address], [place], [place] [address] [phone]  ClubMonetize.fr **housing applications/recertification, emergency and transitional housing  Open [employer] of [employer] [address] [place], [place] [address] [phone] www.odm-hp.org  **emergency and permanent housing; rent/mortgage payment assistance  Habitat for [employer] Corporation, [place] and [place] [address] [place], [place] [address] [phone] ArchitectReviews.com.au  [facility] Services [facility] the [place], [employer] [place], [place], [place] [address] www.familyservice-piedmont.org **emergency shelter for victims of domestic violence and sexual assault  [facility] [address], [place], [place] [address] [phone] www.senior-resources-guilford.org **Home expense assistance/repairs for older [place] of [place], [place], [place]  [address] [phone] NameDisc.is  **May offer help with minor housing repairs  [facility] [address] [place], [place] [address] [phone] **emergency housing for victims of domestic violence  Name Reliance Ages Served & Areas of [place]                                                                                    CartridgeClearance.com.cy   Melissa [name], [place], [place],  [address]                                   Ph: [phone]  Fax: [phone]                       [email or web address] .[email or web address] 44 y/o & up                                           Specialize in psychological & developmental evaluations, general OPT, ADHD, PTSD, parenting skills, conflict resolution & mediation.   Ability to utilize interpretation services Medicaid;  most insurances; some sliding-scale/ reduced rate   [place] https://www.alexwilsoncounselingservices.com/counseling-services (Melissa [name] UPDATED 08/2018)   [address] [place], [place] [address] Ph. [phone]                          Fax. [phone] Ages 6 and up. OPT only. Children, adolescents and adults. ADHD, Autism, Anxiety, Depression, Stress management.  English. Melissa [name] has an ear piece/headset that helps with spanish translation for spanish familes. Medicaid, [place], [place], [place]. NO Aetna or Gannett Co.   [facility] LacrosseRugby.be   [place], [place] [address]  Fax [phone]  Phone: [phone] Ages 3 and up. CBT. Youth anger management groups. Case management, even if you don't meet criteria for mental health services. Has a psychiatrist  for med mgmt. Intensive in home, outpatient therapy, medication management, mobile crisis. Depression, Anxiety,  Bipolar  disorder, Schizophrenia, Anger management, ADHD, OCD, some Autism experience, no eating disorders English only, but they do have access to language line so can serve other languages if needed. All medicaid - will also be in network with the medicaid changes (BCBS and [place] plans)   [facility]   Address: [place], [place], [place] [address] Phone: [phone] Fax: [phone] ABA therapy for children with Autism        [facility]    [address]  [place], [place] [address] Ph: [phone] Fax: [phone] solutions'[email or web address] .[email or web address] or [email or web address] .[email or web address] (admin assistant) Ages 4 and up for outpatient therapy - Ages 12-17 for MST (behavioral problems, facing trouble with law, etc.) KM has a referral form for each) None are specialty specific, such as DE or trans *Provides in home services*  English only except for 1 spanish speaking MST therapist. Receptionist speaks spanish. Does have access to Spanish interpreters Medicaid    Autism Society of [place]   Fax [phone]                           Address: [address] [place], [place] [address] Phone: [phone]         Center for [place]                           MoralBuilder.es   [place], [place], [place] [address]         Ph: [phone]                                         Fax: [phone]  Email:  [email or web address] .com  Children-adults                               Various specialties including CBT, Eating disorders (Melissa [name]), EMDR Melissa [name], Melissa [name]), trauma, depression English Depends on therapist- [place], MedCost, Hartford Financial, [employer] Company, [facility], state administered plans, some take Mohawk Industries Society                    https://www.https://www.stanton-reyes.com/   [address], [place], [place] [address]                                   Ph: [phone] / [phone] Fax: [phone] // For Syreeta directly, phone is [phone] and fax is [phone] Ages 70-21 y/o                                 Individual & family therapy, play therapy, CPP, TF-CBT specializing in issues of adoption, neglect, attachment, trauma, grief, anxiety, depression, anger, behavior management   Medicaid (all MCO/LME except Alliance)                 Will help file with [place]   [address]                              [place], [address]            [phone]                               Melissa [name]: Intake                [phone]  [email or web address] .[email or web address] OTP for children 30 and under, will serve young kids. Serve 14 counties including [place], [place], [place], [place], [place], Social research officer, government. English Medicaid, Medicare, and [facility])                https://arellano.com/                              [address] [place], [place] [address]                                                              Ph: [phone]                                        Fax: [phone] All ages                                            Offer individual & group therapy (all ages), intensive outpatient & partial hospitalization (for adults); child psychiatry available English;  language line for others ([place]) Various private  insurances; some Medicaid; some [place]                                      TempFirms.com.ee   [place], [address]                                 Ph: [phone]                                               Toddlers-44 y/o                           Provide educational, neurodevelopmental and  psychological evaluations-Melissa [name]-, and treatments for children, adolescents and families.  Parent Education and School behavior consultations. Specialties in Autism, ADHD, Developmental / learning disorders, Mood disorders, Tic disorders   Most insurance, including [place] Access Medicaid                             No [place], [place]   [address] [place], [place], [place]                           Main office Phone: [phone] (for [facility] [place])                                             [place] B: Phone: [phone] Fax: [phone]  [email or web address] .Melissa [name]: Phone: [phone] Fax: [place]) [phone]  (cell) [phone] (fax) [email or web address]      Office assistant: Melissa [name] All ages                                                 Individual, group, & family therapy     All: trauma, TF-CBT, school difficulty, mood disorders, health                        Melissa [name]: licensed in anger management & certified substance abuse counselor English                             Jolayne Haines is fluent in [place] All take Medicaid                  Melissa [name]: [place], [place]  Melissa [name]: [place]   [place], [place], [place] [address] Office: [phone] Fax: [phone] Support from a Child/Family Advocate from beginning of case until closed [place] medical exam Trauma Focused Cognitive Behavioral Therapy Support group for non-offending caregivers Minimum of monthly MDT (Multi-Disciplinary Team) Review meetings to ensure all services are being addressed and provided in a timely manner. Immunologist for schools, churches, civic groups, Social research officer, government.        [facility]   [address], [place], [place] [address] Telephone: [phone] Fax: [phone] Group home facility for children in [place] custody - for children in transition DSS back to parents.                                                 Outpatient therapy - ages 52 to 35 English only Medicaid, BCBS, [place] LCSW's and LPC's. Female and female.   [facility] (Used to be [employer] of Care)                        [address], [place], [place] [address]                                   Ph: [phone] (ext 201 for [place])                              Fax: [phone]                                    Hours: M-F 8a-5p                             [email or web address] .com 44 y/o & up                                           Regular OPT, day treatment, higher levels of care.. NO LONGER HAS INTENSIVE IN HOME English; Can use interpreters for other languages ([place]) Medicaid ([place]), [place] Healthchoice, some major insurance (no Airline pilot) Outpatient therapy as well as medication management   Family Service of the [facility]                    http://www.familyservice-piedmont.org/   [place]: [address] [place], [place] [address]                                 Ph: [phone]; Fax: [phone]      [place]: [address], [place], [place] [address]  Ph: [phone]; Fax: [phone] 44 y/o & up                                             OPT- individual, group; Substance abuse; Family dynamics, general mood disorders English; Ability to get interpretation for ALL other languages  Medicaid, Pitney Bowes, [place] Healthchoice (with signed letter from MD), private insurances They prefer that clients walk-in for intake. Walk-in hours are 8:30-12 & 1-2:30pm in [place] and from 8:30-12 & 2-3:30 in [place].                                     IF family cannot walk-in, can fax referral ATTN: Counseling Intake- they will only try  to call the family 1x  Family Solutions                                               http://famsolutions.org/   [place]: [place]. [address], [place], [place] [address]                                 [place]: [address] [place], [place] [address]                                                              Ph: [phone];  Fax: [phone]    Email: [email or web address] .org All ages                                                   Parenting; Play therapy; Trauma (TF-CBT); EMDR, DBT, regular OPT, CPP, ADHD  *School based services offered for children in [place], Mattel, and [place] [employer] regular medicaid and cardinal  English, 1 Spanish-speaking female counselor (sees adult women only) and then Lorette Ang is Spanish-speaking as well Medicaid ([place]), [place] Healthchoice, IBRS/ CarMax, Avnet or send referral form-- need to know family's preference for days & times.                                                           **01/2015- 7 week wait for appts after 3pm                                                             **Do NOT see DE, Autism, active divorce, sexual abuse perpatrators,  addiction  Dr. Evonnie Pat    Fax [phone] OCD therapy English Private, some medicaid Waitlist, esp for Bank of [place] Company, [facility]                                http://www.[email or web address]/   [address] [place], [place] [address]                                                                Ph: [phone]; Fax: [phone]      Hours: Mon-Fri 8am-8pm; Sat & Sun by appt only Children, adolescents, adults               DBT, emotion dysregulation, anxiety, suicidal thoughts, depression, healing from trauma, quality of life issues English only Medicaid, BCBS Call to refer;  some weekend hours by appt only; evening hours until 8pm  [facility]/ Melissa [name], LPC, LCAS                                                https://therapists.psychologytoday.com/rms/name/Inclusion+Counseling+Services,+LLC._[place]_[address]   [address] [place], [place] [address]        Ph: [phone]/ [phone]             Fax: [phone] All ages                                                  Specializes in working with people with disabilities, trauma, addiction. TF-CBT, Seeking Safety, Clinical Addiction Specialist English Medicaid, BCBS Use his referral form; call for other information  [facility]                           PhysicalDelivery.ca   Address: [place], [place], [place] [address]                            Ph: [phone];  Fax: [phone] Children 4 and up, adolescents & adults   Mood disorders, anxiety, postpartum depression, phobias, psychosis, grief & loss, gender identity, adjustment, PTSD. TF-CBT certified English Medicaid, [place], Medicare, [place], [place], [place], [place], private pay [employer]. If no answer, fax referral form.        2 female therapists. [place] Beach no longer see's younger children (age 17, etc.) - he prefers 23 and up.  Dr. Rada Hay area, [place], [place] Various but including Gender Dysphoria Evaluations (letter needed to start transition). Says that he does psych evals, too?? Will NOT see current [employer] students (I think is connected to [facility] and is a conflict of interest maybe? -LP) English No. Will assist in "out of network" paperwork for outpatient visits. Gender Dysphoria evals are NOT covered by ANY insurance. Average cost for these evals is $120-$200. I think he's the head or used to be the head of [facility] student counseling center.  [employer]  All ages   Any is fine - they do not bill insurance    [facility]                                          https://www.hospicegso.org/our-services/support-for-children/grief-services/   [address], [place], [place]            Ph: [phone]   NAT:[phone] 40-18 y/o affected by the serious illness or death of a loved                                                      Individual & group Grief counseling English, Spanish; language line for other languages Any, including Medicaid *Send cfc referral form* Call with the family or call [facility] and ask for Melissa [name] will call the family to schedule  Melissa [name] [facility]   [phone]        [address] [place], [place] [address] Works with children middle school age and up. EMDR (trauma), family therapy, parents who need support [place], [place], G. L. Garcia Medicaid, [place], [place], [place], [place]. MHN Currently has a waiting list.  Melissa Crochet, PhD   [address], [place], [place] [address]          Ph: [phone]; Fax: [phone]         [email or web address] Preschool age - adults                         Individual, family, couples. CBT, Psychodynamic therapy, Anxiety, Depression, Grief/loss, ADHD, separation/ divorce English/ Spanish seeing at least 1 Medicaid patient    [facility]                                                             RunningConvention.de   [address], [place], [place] [address]  Ph: [phone]; Fax: [phone]     -                                                                    To request records for [place], Helper: Tobin Chad (as of 12/29/14) 44 y/o & older                                        Individual & group counseling; Psychiatric/med management; Intensive in-home; ACTT; Crisis services Can accommodate various languages Uninsured (sliding scale); IPRS ([place] Card), Medicaid, [place], [employer] Corporation Patients should walk-in for assessment/ services                               Walk-in hrs: Mon-Th 8a-4p; Fri 8a-1p  (can take a few  hours)  Neuropsychaiatric [place]    In the [facility] [place] at: [address] [place], [place] [address] Phone- [phone] FAX: [phone] ADHD, Anxiety  disorders, PTSD, Psychotic disorders, Schizophrenia, Autistic disorders and mood disorders. Individual therapy, couples/marital therapy, family therapy, med management, transcranial megnetic stimulation advanced therapy, ADHD quotient testing. No autism evals.   Most Insurances, Medicaid, and, Self-Pay.    [facility] [place], [facility]   Fax [phone] Address: [address] [place], [place] [address] Phone: [phone] Depression and Anxiety, Family Therapy, Eating Disorders, ADHD and various Childhood or Adolescent behavioral problems. Trauma Focused Cognitive Behavioral Therapies, Parenting Support, Grief Counseling, Couples Counseling, Stress Management, Addiction & Recovery, Conflict Resolution [place] Medicaid, BCBS, Cigna, Humana, Tricare   [facility]- [employer]ands                                                 http://www.[email or web address]/   [address], [place], [place] [address]                                                             Ph: [phone]; Fax: [phone]      Direct to Melissa [name], Melissa [name]' nurse: [phone]                                                       Walk-in: Mon-Fri 8am-3pm Can only see 52 y/o and older                                                  Outpatient therapy for mental health & co-morbidity- individual & groups; Medication management; [place], Spanish Medicaid; Uninsured; other?   [facility]   [address], [place] [address]  [phone] P  [phone] F Ages 5 and up. Autism, Anxiety, Depression, Eating disorders, ADHD, Trauma, Hoarding, Personality disorders, Gender Identity (LGBTQ), Substance abuse, Anger management, Compulsive behaivors, Family therapy, PTSD. [place] and [place] - has an in house [place], [place], [place], [place], [place], [employer] and private pay - working toward being on panel with [place]           [email or web address]   [place], [place], [place] [address]                Ph: [phone]                                          Fax: [phone]                                    Hours:  Tues 3pm-7pm Ages 41-21 y/o                                     Individual counseling using play therapy, TF-CBT, drama & dance, solution-focused, & MI [place], [place] Medicaid, [place] Healthchoice, [place], Cigna, [place] (formerly [employer])                                                                 http://[email or web address].[email or web address]   [place]: [address] [place], [place], [place] [address]                                   Ph: [place]: [address], [place], [place] [address]                                         Ph: [phone] All ages                                                 OPT & Intensive in-home(need to have tried OPT first) for issues like behavior challenges, ADHD, DV, substance abuse, depression, anxiety, family conflict, challenges in school   Medicaid; ?   [facility]                            http://www.psychotherapeuticservices.com   [address], [place], [place] [address]                                                              Ph: [phone]; Fax: [phone]  Adults only (44 y/o & up)                    Personal care/individual support (life skills); Conservation officer, nature (CST)-- for SPMI English, Spanish Medicaid & State funding Advertising account executive)    [facility]                                         https://www.rivera.org/                   [address], [place], [place], [place] [address]  Ph: [phone]; Fax: [phone]      Crisis line: [phone]                           Varies on program (adolescents & adults, no children)                               Substance abuse services; psychosocial rehab; outpatient  therapy, DWI assessments, Medication management       [place]                 www.savedfound.org   [address] [place], [place] [address]  KG:[phone] Fax: [phone] Children , Adolescents, Adults-specialize in tramua foucused therapy-provide in home outpatient therapy, family therapy, substance abuse therapy English/ Spanish Medicaid only-[place]-will start sliding scale services in March (with interns)   [place]              [email or web address].it   HQ: [address], [place], [place] [address]  W-S: [address], [place], [place], [place] [address]                                           ph: [phone]; fax: [phone] 44y/o & older (through adults)            EMDR, TF-CBT, med management, Triple P, developmental disabilities program   Medicaid (most Lme/MCO), state health plan, private insurance, self-pay   [employer] or email preferred for referrals. Melissa [name] Hence (owner/ therapist) telephone number: [phone] [email or web address]  Located off of [address] in [place], [place].  They do substance abuse, play therapy, trauma work, as well as mood disorders. *DE patients as well* Spanish/English bilingual staff Blue cross, cigna, united health care. Will accept Medicaid beginning Nov. 2019. They have a sliding scale-lowest cost $60/session. End of august 2019 they will have an intern who will do $15 an hour.    [facility]  EntrepreneurBuilder.ch.[place], [place], [place] [address]                                                             Ph: [phone]; fax: [phone]      Email: [email or web address] Mainly adolescents ages 14 and up.     Substance abuse treatment, medication mgmt (NP supervised by director who is a psychiatrist), outpatient therapy. 73 female and mulitple female. Anxiety, depression, DE, OCD, etc.  [place]  Medicaid ([place] only), Aetna, [place], [place] Healthchoice, [place], [facility], most insurance   [facility]    https://russo.net/   [place], [place], [place], [place] [address]        Open: 8-7 M-F                          Ph: [phone]; Fax: [phone]      [email or web address] Children, adolescents, adults            Christian/faith-based individual & family therapy for various issues including traum & behavioral issues English -   Medicaid,  [place], BCBS, [place] Healthchoice, Tricare, other private insurance   [facility]                                 http://www.mytahome.com/mobile-crisis   Mobile crisis: [phone]                       [address], [place], [place] [address]                                                              Ph: [phone]; Fax: [phone] No age restrictions for mobile crisis  Also have PSR, group homes for adults [place], American Sign Language, others For mobile crisis- no insurance or income requirements   [place]           WordAgents.no.php - bad experience with medication management services - most likely will not use in the future    [address], [place], [place] [address]                                                Ph: [phone] (new pts)                        Ph2: [phone]; fax: [phone]. Registration [phone] let them know they want to register as a new patient.  children, adoelscents, adults              Individual & family counseling & med management for issues like substance abuse, Bipolar, gried & loss, family & marriage, OCD, anxiety, panic & modd disorders, PTSD, issues w/ chronic illness English only Most major insurance ([place]); [facility] and [facility]   [facility]                                   [email or web address]   [place], [place] [address],  [place], [place] [address]                                Ph: [phone]; fax: [phone]      Hours: M-Th 14a-8p; Fri 930a-7p Children, adolescents, adults              Individual, family, group counseling   ADHD, anxiety, Autism, behavioral problems, depression, parenting, parent/teen communication, school difficulties, social problems, tourette's & tic disorders                    **Also psychological evaluations & psychiatric consultations English, Spanish Some private, some Medicaid, private pay. Medicaid patients wait much longer for services.   Windee Knox-Heitkamp, [facility] https://www.psychologytoday.com/us/therapists/windee-knox-heitkamp-[place]/308508 (UPDATED 08/20/2018 BY Melissa [name])   Windee Knox-Heitkamp, [place],  [address]        [phone]  Fax:  [phone]                                    Email: [email or web address]   Ages 3 & up through 20's. OPT, Play Therapy, Trauma Focused Therapy. Children with ASD, anxiety disorders, trauma, sexual and physical abuse in children and teenagers, and active divorce.  Has a lot of experience with teens and young women. English Medicaid and HealthChoice. Is not officially on panel with BCBS, but the reimbursement rate has been good.    [facility]                                        http://www.wrightscareservices.com/ (UPDATED 09/01/2018 BY Melissa [name])   [place], [place]                      Ph: [phone]                      Fax: [phone]                           Hours: M-F 8am-7pm                (Some Saturdays as needed) Ages 61 y/o & up. Individual & family therapy, intensive in-home, CBT, TF-CBT, Substance use, behavior modification, LGBTQ and gender dysphoria, eating disorders, risky behaviors. Medication management (in house NP and child psychiatrist) [place], [place] (in house interpreter.) They also have a contract with  language resouces, so they have access to interpreters for other languages  Medicaid, [place] Healthchoice, [place], [place], [place], [employer], [place]. Sliding scale and EAP and flex spending accounts.   [facility]                                                       http://www.youthfocus.org/counseling_services.html   [place]: [address], [place], [place], [place] [address]                        Ph: [phone]; fax: [phone]        [place]: [address], [place], [place], [place] [address]                          Ph: [phone]; fax: [phone] 44 y/o - 44 y/o                                        Individual, group, family counseling; adolescent substance abuse;  groups addressing social skills, anger management, parent education; psych testing only for pt's referred by dept of juvenile justice. NP there once a month for med mgt - clients 13 and up - caseload currently full as of 01/24/17. Pt's must have current CCA and outpatient counseling for NP services English; electronic interpretation for ALL other languages (Spanish included) Medicaid, [place] Healthchoice, Paediatric nurse for self-pay; some [place]  Unlimited                                                http://youthunlimited.cc/   [address], [place], [place] [address]      Main phone: [phone] ALSO has main location in [place], [place]                     Counseling/IIH referrals [place]: [phone];       Psychiatry [place]: [phone]    Fax: [phone] Children & adolescents                       Outpatient counseling, Intensive in-home, psychiatry and higher level services for neglected/abused youth or mental health concerns English Medicaid, [place]   [place], [place], [place] [address] Fax: [phone] Phone: [phone] Ages 5 and up for OPT, 6 and up for evaluations. Offers therapy in home or at office. Anxiety, depression, eating disorders,  OCD/TIC disorders. Does autism evaluations, CARS not ADOS and complete psychological evaluations. Has a substance abuse program.  [address], [place], [place], [place].. Most private insurances    IVF Scholarships and Grants:  [facility] https://cadefoundation.org/grants  AGC Scholarships http://dawson-may.com/  [employer] PlayDetails.com.au  Gift of Parenthood https://giftofparenthood.org/  Journey to Parenthood https://journeytoparenthood.org/faq.html/  Hope for Fertility TerritoryBlog.fr  [employer] https://starfishinfertilityfoundation.org/  [place] Specific:  Pay it [employer] PokerWatcher.com.ee  Infertility Support   Online Support Groups: https://bean-daniels.com/  http://www.lawrence.com/  www.https://clark-gentry.info/ VirginiaBeachSigns.tn  Www.womensmentalhealth.org www.sart.org Www.asrm.org   Hope and Beyond- offers grief and supportive counseling as well as Women's groups [phone] [address], [place], [place] [address]  Professionally-Led Support Groups [place]: Primary Infertility  Contact the host for virtual meeting information. Host: Quay Burow, Psy.D.  Email: [email or web address]   Virtual Peer-led Support Groups [place]: [place] Infertility Support Group Email: [email or web address]   Helpful Links: BuyVirginiaBeach.com.au https://progyny.com/education/emotional-support-and-awareness/  Therapists in the area: Social research officer, government or [place]: [place], [place], [place] and Surrounding Areas  [phone] TEXT OR CALL [email or web address]   Allie Day  [place]:  [place] and surrounding area  [phone] TEXT [place] [email or web address]   Lolly Mustache  [place]: [place], [place], Samantha Crimes, and [place]  [phone] TEXT OR CALL [email or web address]   Christy Sharer [place]:  [place]: [place]a [place], [place] and [place] [phone]  TEXT OR CALL [email or web address]   [place] and surrounding counties  [phone] TEXT OR CALL [email or web address]

## 2022-01-16 NOTE — BH Specialist Note (Signed)
Integrated Behavioral Health via Telemedicine Visit  01/16/2022 [name] [id number]  Number of Integrated Behavioral Health Clinician visits: 3- Third Visit  Session Start time: 1529   Session End time: 1618  Total time in minutes: 49   Referring Provider: Clayton Lefort, MD Patient/Family location: Home [facility] Provider location: [facility] [place] [facility]  All persons participating in visit: [name] and [place]   Types of Service: Individual psychotherapy and Telephone visit  I connected with Melissa [name] and/or Melissa [name]  n/a  via  Telephone or Video Enabled Telemedicine Application  (Video is Caregility application) and verified that I am speaking with the correct person using two identifiers. Discussed confidentiality: Yes   I discussed the limitations of telemedicine and the availability of in person appointments.  Discussed there is a possibility of technology failure and discussed alternative modes of communication if that failure occurs.  I discussed that engaging in this telemedicine visit, they consent to the provision of behavioral healthcare and the services will be billed under their insurance.  Patient and/or legal guardian expressed understanding and consented to Telemedicine visit: Yes   Presenting Concerns: Patient and/or family reports the following symptoms/concerns: Distress over finding out about ex's stepson harming her children; children's fear of their father; looking forward to starting new job at home; getting children into counseling Duration of problem: Ongoing; Severity of problem: moderate  Patient and/or Family's Strengths/Protective Factors: Concrete supports in place (healthy food, safe environments, etc.), Sense of purpose, and Physical Health (exercise, healthy diet, medication compliance, etc.)  Goals Addressed: Patient will:  Reduce symptoms of: anxiety,  depression, and stress   Increase knowledge and/or ability of: stress reduction   Demonstrate ability to: Increase healthy adjustment to current life circumstances  Progress towards Goals: Ongoing  Interventions: Interventions utilized:  Link to [employer] and Supportive Reflection Standardized Assessments completed: Not Needed  Patient and/or Family Response: Patient agrees with treatment plan.   Assessment: Patient currently experiencing Acute stress reaction, Grief and Psychosocial stress.   Patient may benefit from psychoeducation and brief therapeutic interventions regarding coping with symptoms of anxiety, depression, life stress .  Plan: Follow up with behavioral health clinician on : Two weeks Behavioral recommendations:  -Continue taking hydroxyzine as prescribed (as needed only) -Continue plan to apply for new work position as soon as possible -Continue plan to establish care with therapist of choice for sons; list of possible pediatric therapists on After Visit Summary -Consider additional resources on After Visit Summary (infertility support, etc.) Referral(s): [place] (In Clinic)  I discussed the assessment and treatment plan with the patient and/or parent/guardian. They were provided an opportunity to ask questions and all were answered. They agreed with the plan and demonstrated an understanding of the instructions.   They were advised to call back or seek an in-person evaluation if the symptoms worsen or if the condition fails to improve as anticipated.  [place], LCSW     12/06/2021    3:24 PM 09/22/2021    9:34 AM 08/23/2021    5:38 PM 03/21/2021    1:27 PM 08/01/2016    1:45 PM  Depression screen PHQ 2/9  Decreased Interest '1 2 1 '$ 0 0  Down, Depressed, Hopeless '2 2 1 '$ 0 0  PHQ - 2 Score '3 4 2 '$ 0 0  Altered sleeping '2 2 1 '$ 0 2  Tired, decreased energy '2 2 1 '$ 0 2  Change in appetite  $'2 2 1 'u$ 0 2  Feeling bad or  failure about yourself  2 2 0 0 0  Trouble concentrating 0 1 0 0 0  Moving slowly or fidgety/restless 0 0 0 0 0  Suicidal thoughts 0 0 0 0 0  PHQ-9 Score '11 13 5 '$ 0 6  Difficult doing work/chores    Not difficult at all       12/06/2021    3:25 PM 09/22/2021    9:34 AM 08/23/2021    5:38 PM 03/21/2021    1:27 PM  GAD 7 : Generalized Anxiety Score  Nervous, Anxious, on Edge '1 2 1 '$ 0  Control/stop worrying '2 2 1 '$ 0  Worry too much - different things '3 2 1 1  '$ Trouble relaxing 2 2 0 0  Restless 1 1 0 0  Easily annoyed or irritable 1 1 0 0  Afraid - awful might happen 0 1 0 0  Total GAD 7 Score '10 11 3 1  '$ Anxiety Difficulty    Not difficult at all

## 2022-01-17 NOTE — BH Specialist Note (Unsigned)
Integrated Behavioral Health via Telemedicine Visit  01/17/2022 Marianne Golightly [id number]  Number of Integrated Behavioral Health Clinician visits: 3- Third Visit  Session Start time: 1529   Session End time: 1618  Total time in minutes: 49   Referring Provider: Clayton Lefort, MD Patient/Family location: Home*** [facility] Provider location: [facility] [place] [facility]  All persons participating in visit: Patient Abia [place] and [place] ***  Types of Service: {CHL AMB TYPE OF SERVICE:[phone]}  I connected with [name] and/or Lorriane Shire Vereen's {family members:20[address]} via  Telephone or [employer]  (Video is Caregility application) and verified that I am speaking with the correct person using two identifiers. Discussed confidentiality: {YES/NO:21197}  I discussed the limitations of telemedicine and the availability of in person appointments.  Discussed there is a possibility of technology failure and discussed alternative modes of communication if that failure occurs.  I discussed that engaging in this telemedicine visit, they consent to the provision of behavioral healthcare and the services will be billed under their insurance.  Patient and/or legal guardian expressed understanding and consented to Telemedicine visit: Yes   Presenting Concerns: Patient and/or family reports the following symptoms/concerns: *** Duration of problem: ***; Severity of problem: {Mild/Moderate/Severe:20260}  Patient and/or Family's Strengths/Protective Factors: {CHL AMB BH PROTECTIVE [phone]}  Goals Addressed: Patient will:  Reduce symptoms of: {IBH Symptoms:21014056}   Increase knowledge and/or ability of: {IBH Patient Tools:21014057}   Demonstrate ability to: {IBH Goals:21014053}  Progress towards Goals: Ongoing  Interventions: Interventions utilized:  {IBH Interventions:21014054} Standardized  Assessments completed: {IBH Screening Tools:21014051}  Patient and/or Family Response: Patient agrees with treatment plan.   Assessment: Patient currently experiencing ***.   Patient may benefit from psychoeducation and brief therapeutic interventions regarding coping with symptoms of *** .  Plan: Follow up with behavioral health clinician on : *** Behavioral recommendations:  -*** -*** Referral(s): {IBH Referrals:21014055}  I discussed the assessment and treatment plan with the patient and/or parent/guardian. They were provided an opportunity to ask questions and all were answered. They agreed with the plan and demonstrated an understanding of the instructions.   They were advised to call back or seek an in-person evaluation if the symptoms worsen or if the condition fails to improve as anticipated.  [place], LCSW     12/06/2021    3:24 PM 09/22/2021    9:34 AM 08/23/2021    5:38 PM 03/21/2021    1:27 PM 08/01/2016    1:45 PM  Depression screen PHQ 2/9  Decreased Interest '1 2 1 '$ 0 0  Down, Depressed, Hopeless '2 2 1 '$ 0 0  PHQ - 2 Score '3 4 2 '$ 0 0  Altered sleeping '2 2 1 '$ 0 2  Tired, decreased energy '2 2 1 '$ 0 2  Change in appetite '2 2 1 '$ 0 2  Feeling bad or failure about yourself  2 2 0 0 0  Trouble concentrating 0 1 0 0 0  Moving slowly or fidgety/restless 0 0 0 0 0  Suicidal thoughts 0 0 0 0 0  PHQ-9 Score '11 13 5 '$ 0 6  Difficult doing work/chores    Not difficult at all       12/06/2021    3:25 PM 09/22/2021    9:34 AM 08/23/2021    5:38 PM 03/21/2021    1:27 PM  GAD 7 : Generalized Anxiety Score  Nervous, Anxious, on Edge '1 2 1 '$ 0  Control/stop worrying '2 2 1 '$ 0  Worry too much - different things '3 2 1 1  '$ Trouble relaxing 2 2 0 0  Restless 1 1 0 0  Easily annoyed or irritable 1 1 0 0  Afraid - awful might happen 0 1 0 0  Total GAD 7 Score '10 11 3 1  '$ Anxiety Difficulty    Not difficult at all

## 2022-01-30 ENCOUNTER — Ambulatory Visit: Payer: Medicaid Other | Admitting: Clinical

## 2022-01-30 DIAGNOSIS — F4321 Adjustment disorder with depressed mood: Secondary | ICD-10-CM

## 2022-01-30 DIAGNOSIS — Z658 Other specified problems related to psychosocial circumstances: Secondary | ICD-10-CM

## 2022-02-07 NOTE — BH Specialist Note (Signed)
Integrated Behavioral Health via Telemedicine Visit  02/21/2022 Melissa [name] [id number]  Number of Integrated Behavioral Health Clinician visits: 5-Fifth Visit  Session Start time: 6803   Session End time: 2122  Total time in minutes: 24   Referring Provider: Clayton Lefort, MD Patient/Family location: Home [facility] Provider location: [facility] [place] [facility]  All persons participating in visit: [name] and [place]   Types of Service: Individual psychotherapy and Video visit  I connected with Melissa [name] and/or Melissa [name]  n/a  via  Telephone or Video Enabled Telemedicine Application  (Video is Caregility application) and verified that I am speaking with the correct person using two identifiers. Discussed confidentiality: Yes   I discussed the limitations of telemedicine and the availability of in person appointments.  Discussed there is a possibility of technology failure and discussed alternative modes of communication if that failure occurs.  I discussed that engaging in this telemedicine visit, they consent to the provision of behavioral healthcare and the services will be billed under their insurance.  Patient and/or legal guardian expressed understanding and consented to Telemedicine visit: Yes   Presenting Concerns: Patient and/or family reports the following symptoms/concerns: Processing multiple job offers and positive life changes for the future.  Duration of problem: Ongoing; Severity of problem: moderate  Patient and/or Family's Strengths/Protective Factors: Concrete supports in place (healthy food, safe environments, etc.), Sense of purpose, and Physical Health (exercise, healthy diet, medication compliance, etc.)  Goals Addressed: Patient will:  Maintain  reduction of symptoms of: anxiety, depression, and stress    Demonstrate ability to: Increase healthy adjustment to current life  circumstances  Progress towards Goals: Ongoing  Interventions: Interventions utilized:  Supportive Reflection Standardized Assessments completed: Not Needed  Patient and/or Family Response: Patient agrees with treatment plan.   Assessment: Patient currently experiencing Other specified counseling and Psychosocial stress.   Patient may benefit from continued therapeutic interventions today.  Plan: Follow up with behavioral health clinician on : Call Reilynn Lauro at [phone], as needed. Behavioral recommendations:  -Continue plan to decide upon job acceptance -Continue plan to begin family therapy  Referral(s): [place] (In Clinic)  I discussed the assessment and treatment plan with the patient and/or parent/guardian. They were provided an opportunity to ask questions and all were answered. They agreed with the plan and demonstrated an understanding of the instructions.   They were advised to call back or seek an in-person evaluation if the symptoms worsen or if the condition fails to improve as anticipated.  Caroleen Hamman Stefany Starace, LCSW

## 2022-02-21 ENCOUNTER — Ambulatory Visit (INDEPENDENT_AMBULATORY_CARE_PROVIDER_SITE_OTHER): Payer: Medicaid Other | Admitting: Clinical

## 2022-02-21 DIAGNOSIS — F4321 Adjustment disorder with depressed mood: Secondary | ICD-10-CM | POA: Diagnosis not present

## 2022-02-21 DIAGNOSIS — Z658 Other specified problems related to psychosocial circumstances: Secondary | ICD-10-CM

## 2022-02-21 NOTE — Patient Instructions (Signed)
[facility] [address] [place], [place] [address] [phone] (main office) [phone] (Armondo Cech's office)   

## 2022-03-01 DIAGNOSIS — H66001 Acute suppurative otitis media without spontaneous rupture of ear drum, right ear: Secondary | ICD-10-CM | POA: Diagnosis not present

## 2022-03-01 DIAGNOSIS — L259 Unspecified contact dermatitis, unspecified cause: Secondary | ICD-10-CM | POA: Diagnosis not present

## 2022-03-01 DIAGNOSIS — K0381 Cracked tooth: Secondary | ICD-10-CM | POA: Diagnosis not present

## 2022-03-04 ENCOUNTER — Encounter (HOSPITAL_BASED_OUTPATIENT_CLINIC_OR_DEPARTMENT_OTHER): Payer: Self-pay | Admitting: Emergency Medicine

## 2022-03-04 ENCOUNTER — Telehealth (HOSPITAL_BASED_OUTPATIENT_CLINIC_OR_DEPARTMENT_OTHER): Payer: Self-pay | Admitting: Emergency Medicine

## 2022-03-04 ENCOUNTER — Emergency Department (HOSPITAL_BASED_OUTPATIENT_CLINIC_OR_DEPARTMENT_OTHER)
Admission: EM | Admit: 2022-03-04 | Discharge: 2022-03-04 | Disposition: A | Discharge status: Home / Self Care | Payer: Medicaid Other | Attending: Emergency Medicine | Admitting: Emergency Medicine

## 2022-03-04 DIAGNOSIS — K0889 Other specified disorders of teeth and supporting structures: Secondary | ICD-10-CM | POA: Diagnosis not present

## 2022-03-04 MED ORDER — FLUCONAZOLE 150 MG PO TABS: 150.0000 mg | ORAL_TABLET | Freq: Once | ORAL | 0 refills | Status: AC

## 2022-03-04 MED ORDER — OXYCODONE HCL 5 MG PO TABS: 5.0000 mg | ORAL_TABLET | Freq: Four times a day (QID) | ORAL | 0 refills | Status: DC | PRN

## 2022-03-04 NOTE — ED Notes (Signed)
Pt discharged to home. Discharge instructions have been discussed with patient and/or family members. Pt verbally acknowledges understanding d/c instructions, and endorses comprehension to checkout at registration before leaving.  °

## 2022-03-04 NOTE — Discharge Instructions (Signed)
Follow-up with dentist on Tuesday.  I have prescribed you narcotic pain medicine, this medication is sedating so do not mix with alcohol or other drugs or dangerous activities including driving.  I also recommend that you take 1000 mg of Tylenol every 6 hours as needed for pain as well.

## 2022-03-04 NOTE — ED Triage Notes (Signed)
Pt reports dental pain to RT upper gums; recent ear infection to RT ear

## 2022-03-04 NOTE — ED Provider Notes (Signed)
[facility]PART[facility] Provider Note   CSN: [id number] Arrival date & time: 03/04/22  1145     History  Chief Complaint  Patient presents with   Dental Pain    Summar Mcglothlin is a 46 y.o. female.  Patient is here with right upper dental pain.  She is on Augmentin last 2 days for ear infection.  Dental pain started to get worse this morning.  Has had a cracked tooth there for some time but now causing discomfort.  Nothing makes it worse or better.  Over-the-counter medications have not helped.  She denies any nausea, vomiting, fever, chills.  No difficulty eating or drinking.  The history is provided by the patient.       Home Medications Prior to Admission medications   Medication Sig Start Date End Date Taking? Authorizing Provider  oxyCODONE (ROXICODONE) 5 MG immediate release tablet Take 1 tablet (5 mg total) by mouth every 6 (six) hours as needed for up to 10 doses for breakthrough pain. 03/04/22  Yes Phuc Kluttz, DO  hydrOXYzine (ATARAX) 25 MG tablet Take 1 tablet (25 mg total) by mouth every 8 (eight) hours as needed. 12/10/21   Quintella Reichert, MD  loratadine (CLARITIN) 10 MG tablet Take 1 tablet by mouth daily.    [provider]  diphenhydrAMINE (BENADRYL) 12.5 MG/5ML liquid Take 12.5 mg by mouth once.  08/01/11  [provider]      Allergies    Philippa Sicks scolymus (artichoke)] and Prednisone    Review of Systems   Review of Systems  Physical Exam Updated Vital Signs BP 130/76 (BP Location: Left Arm)   Pulse 75   Temp 98.3 F (36.8 C) (Oral)   Resp 18   SpO2 100%  Physical Exam Constitutional:      General: She is not in acute distress.    Appearance: She is not ill-appearing.  HENT:     Head: Normocephalic.     Comments: Crack tooth in the right upper jaw, there is no obvious abscess or drainage, no trismus, no drooling, no facial swelling    Right Ear: Tympanic membrane normal.     Left Ear: Tympanic  membrane normal.     Nose: Nose normal.     Mouth/Throat:     Mouth: Mucous membranes are moist.  Eyes:     Pupils: Pupils are equal, round, and reactive to light.  Cardiovascular:     Pulses: Normal pulses.  Neurological:     Mental Status: She is alert.     ED Results / Procedures / Treatments   Labs (all labs ordered are listed, but only abnormal results are displayed) Labs Reviewed - No data to display  EKG None  Radiology No results found.  Procedures Procedures    Medications Ordered in ED Medications - No data to display  ED Course/ Medical Decision Making/ A&P                           Medical Decision Making Risk Prescription drug management.   Jaice Lague is here for right upper dental pain.  She has a cracked tooth on exam.  Suspect that she has developed some infection/nerve type pain.  Will prescribe Roxicodone for breakthrough pain.  She has a dentist appointment for Tuesday.  She is already on antibiotics for ear infection which should help treat any possible dental infection going on.  She has no trismus or drooling.  Very  well-appearing.  Discharged in good condition.  Normal vitals.  No fever.  This chart was dictated using voice recognition software.  Despite best efforts to proofread,  errors can occur which can change the documentation meaning.         Final Clinical Impression(s) / ED Diagnoses Final diagnoses:  Pain, dental    Rx / DC Orders ED Discharge Orders          Ordered    oxyCODONE (ROXICODONE) 5 MG immediate release tablet  Every 6 hours PRN        03/04/22 1208              Lennice Sites, DO 03/04/22 1210

## 2022-03-06 ENCOUNTER — Emergency Department (HOSPITAL_BASED_OUTPATIENT_CLINIC_OR_DEPARTMENT_OTHER)
Admission: EM | Admit: 2022-03-06 | Discharge: 2022-03-06 | Disposition: A | Discharge status: Home / Self Care | Payer: Medicaid Other | Attending: Emergency Medicine | Admitting: Emergency Medicine

## 2022-03-06 ENCOUNTER — Other Ambulatory Visit: Payer: Self-pay

## 2022-03-06 ENCOUNTER — Encounter (HOSPITAL_BASED_OUTPATIENT_CLINIC_OR_DEPARTMENT_OTHER): Payer: Self-pay | Admitting: Pediatrics

## 2022-03-06 DIAGNOSIS — K648 Other hemorrhoids: Secondary | ICD-10-CM | POA: Insufficient documentation

## 2022-03-06 DIAGNOSIS — K6289 Other specified diseases of anus and rectum: Secondary | ICD-10-CM | POA: Diagnosis not present

## 2022-03-06 DIAGNOSIS — K644 Residual hemorrhoidal skin tags: Secondary | ICD-10-CM | POA: Insufficient documentation

## 2022-03-06 MED ORDER — HYDROCORTISONE ACETATE 25 MG RE SUPP
25.0000 mg | Freq: Once | RECTAL | Status: AC
  Administered 2022-03-06: 25 mg via RECTAL
  Filled 2022-03-06: qty 1

## 2022-03-06 MED ORDER — DOCUSATE SODIUM 100 MG PO CAPS: 100.0000 mg | ORAL_CAPSULE | Freq: Two times a day (BID) | ORAL | 0 refills | Status: DC

## 2022-03-06 MED ORDER — LIDOCAINE HCL 2 % EX CREA: 1.0000 | TOPICAL_CREAM | Freq: Two times a day (BID) | CUTANEOUS | 0 refills | Status: DC

## 2022-03-06 NOTE — Discharge Instructions (Addendum)
You were seen in the emergency department for rectal pain.  This is likely related to your internal hemorrhoids.  I am prescribing you lidocaine cream and a stool softener to use as needed for pain and constipation.  You may continue to use sitz bath's and Preparation H cream at home.  I have referred you to general surgery for evaluation of your hemorrhoids as they are causing you ongoing symptoms.  Please call them in 2 days if you do not receive a call to schedule an appointment.  Please follow-up with your primary care provider as well.  Return to the emergency department for significant increase in rectal bleeding.

## 2022-03-06 NOTE — ED Notes (Signed)
Discharge instructions reviewed with patient. Patient verbalizes understanding, no further questions at this time. Medications/prescriptions and follow up information provided. No acute distress noted at time of departure.  

## 2022-03-06 NOTE — ED Provider Notes (Signed)
[facility] EM[facility]GENCY DEPARTMENT Provider Note   CSN: [id number] Arrival date & time: 03/06/22  1003     History  Chief Complaint  Patient presents with   Rectal Pain    Melissa [name] is a 46 y.o. female.  With past medical history of obesity, uterine fibroids who presents to the emergency department with rectal pain.  Today she was using the bathroom and straining when she had a soft bowel movement with bright red blood in it.  She states that she then was wiping when she felt like something was in around her rectum.  She states that she then stood up and "something went back into my rectum."  She states that afterward she took a sitz bath, applied hemorrhoidal cream and ice.  States that since then she has had rectal pain and is unable to sit on her bottom.  She has been sitting on her side.  States that this is happened previously and she has known hemorrhoids.  She denies having any itching.  Denies having any abdominal pain, nausea or vomiting.  No pain with defecation.  No fevers.  HPI     Home Medications Prior to Admission medications   Medication Sig Start Date End Date Taking? Authorizing Provider  docusate sodium (COLACE) 100 MG capsule Take 1 capsule (100 mg total) by mouth every 12 (twelve) hours. 03/06/22  Yes Mickie Hillier, PA-C  Lidocaine HCl 2 % CREA Apply 1 Application topically 2 (two) times daily. 03/06/22  Yes Mickie Hillier, PA-C  hydrOXYzine (ATARAX) 25 MG tablet Take 1 tablet (25 mg total) by mouth every 8 (eight) hours as needed. 12/10/21   Quintella Reichert, MD  loratadine (CLARITIN) 10 MG tablet Take 1 tablet by mouth daily.    [provider]  oxyCODONE (ROXICODONE) 5 MG immediate release tablet Take 1 tablet (5 mg total) by mouth every 6 (six) hours as needed for up to 10 doses for breakthrough pain. 03/04/22   Curatolo, Adam, DO  diphenhydrAMINE (BENADRYL) 12.5 MG/5ML liquid Take 12.5 mg by mouth once.  08/01/11  [provider]      Allergies    Philippa Sicks scolymus (artichoke)] and Prednisone    Review of Systems   Review of Systems  Gastrointestinal:  Positive for blood in stool and rectal pain.  All other systems reviewed and are negative.   Physical Exam Updated Vital Signs BP 135/81 (BP Location: Left Arm)   Pulse 72   Temp 98.4 F (36.9 C) (Oral)   Resp 18   Ht '5\' 4"'$  (1.626 m)   Wt 116.1 kg   LMP 02/05/2022   SpO2 100%   BMI 43.94 kg/m  Physical Exam Vitals and nursing note reviewed. Exam conducted with a chaperone present.  Constitutional:      General: She is not in acute distress.    Appearance: Normal appearance. She is not ill-appearing or toxic-appearing.  HENT:     Head: Normocephalic and atraumatic.  Eyes:     General: No scleral icterus. Pulmonary:     Effort: Pulmonary effort is normal. No respiratory distress.  Genitourinary:    Rectum: Tenderness, external hemorrhoid and internal hemorrhoid present. No mass or anal fissure. Normal anal tone.     Comments: Chaperone present There are nonthrombosed external hemorrhoids present.  There are some internal hemorrhoids palpated on rectal exam.  There is no obvious rectal bleeding at this time.  No anal fissures.  No hard masses palpated. Skin:  General: Skin is warm and dry.     Coloration: Skin is not pale.     Findings: No rash.  Neurological:     General: No focal deficit present.     Mental Status: She is alert and oriented to person, place, and time. Mental status is at baseline.  Psychiatric:        Mood and Affect: Mood normal.        Behavior: Behavior normal.        Thought Content: Thought content normal.        Judgment: Judgment normal.     ED Results / Procedures / Treatments   Labs (all labs ordered are listed, but only abnormal results are displayed) Labs Reviewed - No data to display  EKG None  Radiology No results found.  Procedures Procedures    Medications Ordered in  ED Medications  hydrocortisone (ANUSOL-HC) suppository 25 mg (25 mg Rectal Given 03/06/22 1134)    ED Course/ Medical Decision Making/ A&P                           Medical Decision Making Risk OTC drugs. Prescription drug management.  This patient presents to the ED with chief complaint(s) of rectal pain with pertinent past medical history of hemorrhoids which further complicates the presenting complaint. The complaint involves an extensive differential diagnosis and also carries with it a high risk of complications and morbidity.    The differential diagnosis includes mass, diverticulitis, abscess, internal or external hemorrhoids, anal fissure, etc.  Additional history obtained: Additional history obtained from  none available Records reviewed Care Everywhere/External Records and Primary Care Documents  ED Course and Reassessment: 46 year old female who presents to the emergency department with rectal pain and having bright red blood in her stool yesterday.  Physical exam with chaperone present shows that she does have external and internal hemorrhoids.  None seem to be thrombosed.  There is no anal fissure.  There is no evidence of a perirectal abscess.  There is no induration or fluctuance in the perirectal area.  No rectal prolapse.  We will provide with Anusol steroid while she is here given her pain.  I am also going to prescribe her Colace and lidocaine cream.  She is already using preparation cream as well as sitz bath's which is appropriate.  We discussed hemorrhoidal care at the bedside.  I am also can refer her to general surgery as this is a recurrent issue for her.  Feel that if she is having ongoing pain and rectal bleeding from her hemorrhoids that she should have evaluation for removal.  She is agreeable to this.  Instructed to continue using stool softeners to avoid any constipation and further exacerbation of her symptoms.  She verbalized understanding.  Given return  precautions for significantly increased bleeding or fevers with her rectal pain.  Verbalized understanding.  Safe for discharge.  Independent labs interpretation:  The following labs were independently interpreted: Not indicated  Independent visualization of imaging: Not indicated  Consultation: - Consulted or discussed management/test interpretation w/ external professional: Not indicated  Consideration for admission or further workup: Not indicated Social Determinants of health: None identified Final Clinical Impression(s) / ED Diagnoses Final diagnoses:  Rectal pain    Rx / DC Orders ED Discharge Orders          Ordered    Lidocaine HCl 2 % CREA  2 times daily  03/06/22 1132    docusate sodium (COLACE) 100 MG capsule  Every 12 hours        03/06/22 1132    Ambulatory referral to General Surgery        03/06/22 1132              Mickie Hillier, PA-C 03/06/22 1219    Isla Pence, MD 03/06/22 1435

## 2022-03-06 NOTE — ED Triage Notes (Signed)
C/o rectal pain and sensation that there's something there after a bowel movement yesterday; stated her BM is always blood stained;

## 2022-03-12 ENCOUNTER — Ambulatory Visit (INDEPENDENT_AMBULATORY_CARE_PROVIDER_SITE_OTHER): Payer: Medicaid Other | Admitting: *Deleted

## 2022-03-12 DIAGNOSIS — Z3202 Encounter for pregnancy test, result negative: Secondary | ICD-10-CM | POA: Diagnosis not present

## 2022-03-12 DIAGNOSIS — Z3200 Encounter for pregnancy test, result unknown: Secondary | ICD-10-CM

## 2022-03-12 LAB — POCT PREGNANCY, URINE: Preg Test, Ur: NEGATIVE

## 2022-03-12 NOTE — Progress Notes (Signed)
Han dropped off a urine for a pregnancy test which was negative. I called [facility] and was unable to leave a message. I heard a message voicemailbox is full. I will call once more.  Staci Acosta I called [facility] again and informed her pregnancy test was negative. She reports she has been having pregnancy symptoms such as breast tenderness, nausea, lightheaded. Reports periods usually regular. Reports LMP 01/12/22 and 02/05/22 and both were  normal.also that she  had miscarriage in July. Had follow up b[facility]cg until negative.  I recommended repeat urine pregnancy test one week and her wait in office and if negative we can do serum pregnancy test. I also advised ectopic precautions. She voices understanding with plan. Staci Acosta

## 2022-03-13 ENCOUNTER — Other Ambulatory Visit (HOSPITAL_BASED_OUTPATIENT_CLINIC_OR_DEPARTMENT_OTHER): Payer: Self-pay

## 2022-03-13 MED ORDER — FLUARIX QUADRIVALENT 0.5 ML IM SUSY
PREFILLED_SYRINGE | INTRAMUSCULAR | 0 refills | Status: DC
  Filled 2022-03-13: qty 0.5, 1d supply, fill #0

## 2022-03-20 ENCOUNTER — Ambulatory Visit (INDEPENDENT_AMBULATORY_CARE_PROVIDER_SITE_OTHER): Payer: Medicaid Other

## 2022-03-20 ENCOUNTER — Other Ambulatory Visit: Payer: Self-pay

## 2022-03-20 VITALS — BP 133/81 | HR 72 | Wt 253.3 lb

## 2022-03-20 DIAGNOSIS — Z3200 Encounter for pregnancy test, result unknown: Secondary | ICD-10-CM

## 2022-03-20 DIAGNOSIS — Z3202 Encounter for pregnancy test, result negative: Secondary | ICD-10-CM | POA: Diagnosis not present

## 2022-03-20 LAB — POCT PREGNANCY, URINE: Preg Test, Ur: NEGATIVE

## 2022-03-20 NOTE — Progress Notes (Signed)
Possible Pregnancy  Here today for pregnancy confirmation. UPT in office today is negative. Patient was seen last week, 03/13/22 for UPT, which was also negative. Pt has not had any positive home UPT, but is feeling symptoms of pregnancy such as breast tenderness and acid reflux. Continues to have regularly monthly period; LMP 03/05/22, states this was lighter than normal. Pt desires pregnancy. Pt waiting to see fertility specialist once new insurance is active. Offered provider visit, pt accepts as she would like to discuss Sonata procedure further with Elgie Congo, MD.   Annabell Howells, RN 03/20/2022  8:21 AM

## 2022-05-07 ENCOUNTER — Emergency Department (HOSPITAL_BASED_OUTPATIENT_CLINIC_OR_DEPARTMENT_OTHER): Payer: Medicaid Other

## 2022-05-07 ENCOUNTER — Other Ambulatory Visit: Payer: Self-pay

## 2022-05-07 ENCOUNTER — Encounter (HOSPITAL_BASED_OUTPATIENT_CLINIC_OR_DEPARTMENT_OTHER): Payer: Self-pay

## 2022-05-07 ENCOUNTER — Emergency Department (HOSPITAL_BASED_OUTPATIENT_CLINIC_OR_DEPARTMENT_OTHER)
Admission: EM | Admit: 2022-05-07 | Discharge: 2022-05-07 | Disposition: A | Discharge status: Home / Self Care | Payer: Medicaid Other | Attending: Emergency Medicine | Admitting: Emergency Medicine

## 2022-05-07 DIAGNOSIS — N390 Urinary tract infection, site not specified: Secondary | ICD-10-CM | POA: Insufficient documentation

## 2022-05-07 DIAGNOSIS — R1031 Right lower quadrant pain: Secondary | ICD-10-CM | POA: Diagnosis present

## 2022-05-07 DIAGNOSIS — D72829 Elevated white blood cell count, unspecified: Secondary | ICD-10-CM | POA: Diagnosis not present

## 2022-05-07 LAB — CBC WITH DIFFERENTIAL/PLATELET
Abs Immature Granulocytes: 0.01 10*3/uL (ref 0.00–0.07)
Basophils Absolute: 0 10*3/uL (ref 0.0–0.1)
Basophils Relative: 0 %
Eosinophils Absolute: 0.1 10*3/uL (ref 0.0–0.5)
Eosinophils Relative: 2 %
HCT: 39.2 % (ref 36.0–46.0)
Hemoglobin: 12.7 g/dL (ref 12.0–15.0)
Immature Granulocytes: 0 %
Lymphocytes Relative: 27 %
Lymphs Abs: 1.9 10*3/uL (ref 0.7–4.0)
MCH: 27.7 pg (ref 26.0–34.0)
MCHC: 32.4 g/dL (ref 30.0–36.0)
MCV: 85.6 fL (ref 80.0–100.0)
Monocytes Absolute: 0.6 10*3/uL (ref 0.1–1.0)
Monocytes Relative: 8 %
Neutro Abs: 4.2 10*3/uL (ref 1.7–7.7)
Neutrophils Relative %: 63 %
Platelets: 325 10*3/uL (ref 150–400)
RBC: 4.58 MIL/uL (ref 3.87–5.11)
RDW: 14.8 % (ref 11.5–15.5)
WBC: 6.8 10*3/uL (ref 4.0–10.5)
nRBC: 0 % (ref 0.0–0.2)

## 2022-05-07 LAB — COMPREHENSIVE METABOLIC PANEL
ALT: 30 U/L (ref 0–44)
AST: 33 U/L (ref 15–41)
Albumin: 3.6 g/dL (ref 3.5–5.0)
Alkaline Phosphatase: 48 U/L (ref 38–126)
Anion gap: 7 (ref 5–15)
BUN: 20 mg/dL (ref 6–20)
CO2: 25 mmol/L (ref 22–32)
Calcium: 9 mg/dL (ref 8.9–10.3)
Chloride: 104 mmol/L (ref 98–111)
Creatinine, Ser: 0.94 mg/dL (ref 0.44–1.00)
GFR, Estimated: >60 mL/min (ref >60)
Glucose, Bld: 88 mg/dL (ref 70–99)
Potassium: 4.6 mmol/L (ref 3.5–5.1)
Sodium: 136 mmol/L (ref 135–145)
Total Bilirubin: 0.3 mg/dL (ref 0.3–1.2)
Total Protein: 7.5 g/dL (ref 6.5–8.1)

## 2022-05-07 LAB — URINALYSIS, ROUTINE W REFLEX MICROSCOPIC
Bilirubin Urine: NEGATIVE
Glucose, UA: NEGATIVE mg/dL
Ketones, ur: NEGATIVE mg/dL
Nitrite: NEGATIVE
Protein, ur: 30 mg/dL — AB
Specific Gravity, Urine: 1.02 (ref 1.005–1.030)
pH: 7 (ref 5.0–8.0)

## 2022-05-07 LAB — LIPASE, BLOOD: Lipase: 38 U/L (ref 11–51)

## 2022-05-07 LAB — PREGNANCY, URINE: Preg Test, Ur: NEGATIVE

## 2022-05-07 LAB — URINALYSIS, MICROSCOPIC (REFLEX): WBC, UA: >50 WBC/hpf (ref 0–5)

## 2022-05-07 MED ORDER — FLUCONAZOLE 150 MG PO TABS: 150.0000 mg | ORAL_TABLET | Freq: Every day | ORAL | 0 refills | Status: DC

## 2022-05-07 MED ORDER — CEPHALEXIN 250 MG PO CAPS: 250.0000 mg | ORAL_CAPSULE | Freq: Four times a day (QID) | ORAL | 0 refills | Status: AC

## 2022-05-07 MED ORDER — ONDANSETRON HCL 4 MG PO TABS: 4.0000 mg | ORAL_TABLET | Freq: Three times a day (TID) | ORAL | 0 refills | Status: AC | PRN

## 2022-05-07 NOTE — Discharge Instructions (Signed)
Thank you for allowing [place] to take care of you today.  As discussed, your CT scan, lab work, and exam were reassuring and you are safe to be discharged at this time. With your symptoms and the results of your urine study, we will treat you with antibiotics as prescribed for a urinary tract infection. Please complete all of these as prescribed.   I have also prescribed a short course of nausea medication and a medication to treat a yeast infection should you have this complication.  If you develop a fever, uncontrolled vomiting, inability to tolerate food or drinks, severe abdominal pain, inability to urinate, or other concerns, please return to your closest emergency department for re-evaluation.

## 2022-05-07 NOTE — ED Provider Notes (Signed)
[facility] EM[facility] DEPARTMENT Provider Note   CSN: [id number] Arrival date & time: 05/07/22  1457     History  Chief Complaint  Patient presents with   Abdominal Pain    Melissa [name] is a 46 y.o. female presenting to ED with complaints of right flank and suprapubic pain that started today. She describes pain as constant to right flank and intermittent to suprapubic region when she urinates. She also notes urinary urgency, hesitancy, and dysuria with going to the bathroom today. She denies fever, chills, nausea, vomiting, diarrhea, inability to tolerate PO intake, dizziness, gross hematuria, vaginal bleeding, or vaginal discharge. Reports she has had a few UTIs before but does not get them frequently. No recent antibiotic use. She has a known history of uterine fibroids and had an appendectomy last year.      Home Medications Prior to Admission medications   Medication Sig Start Date End Date Taking? Authorizing Provider  cephALEXin (KEFLEX) 250 MG capsule Take 1 capsule (250 mg total) by mouth 4 (four) times daily for 5 days. 05/07/22 05/12/22 Yes Coline Calkin L, PA-C  fluconazole (DIFLUCAN) 150 MG tablet Take 1 tablet (150 mg total) by mouth daily. 05/07/22  Yes Jeriyah Granlund L, PA-C  ondansetron (ZOFRAN) 4 MG tablet Take 1 tablet (4 mg total) by mouth every 8 (eight) hours as needed for up to 3 days for nausea or vomiting. 05/07/22 05/10/22 Yes Mckenzye Cutright L, PA-C  amoxicillin-clavulanate (AUGMENTIN) 875-125 MG tablet Take 1 tablet by mouth 2 (two) times daily. for 7 days 03/01/22   [provider]  chlorhexidine (PERIDEX) 0.12 % solution 2 (two) times daily. 03/06/22   [provider]  clobetasol cream (TEMOVATE) 0.05 % Apply topically 2 (two) times daily. 03/01/22   [provider]  docusate sodium (COLACE) 100 MG capsule Take 1 capsule (100 mg total) by mouth every 12 (twelve) hours. Patient not taking: Reported on 03/20/2022 03/06/22    Mickie Hillier, PA-C  fluconazole (DIFLUCAN) 150 MG tablet Take 150 mg by mouth every 3 (three) days. 03/09/22   [provider]  hydrOXYzine (ATARAX) 25 MG tablet Take 1 tablet (25 mg total) by mouth every 8 (eight) hours as needed. Patient not taking: Reported on 03/20/2022 12/10/21   Quintella Reichert, MD  ibuprofen (ADVIL) 800 MG tablet Take 800 mg by mouth every 8 (eight) hours as needed. 03/06/22   [provider]  Lidocaine HCl 2 % CREA Apply 1 Application topically 2 (two) times daily. 03/06/22   Mickie Hillier, PA-C  loratadine (CLARITIN) 10 MG tablet Take 1 tablet by mouth daily.    [provider]  oxyCODONE (ROXICODONE) 5 MG immediate release tablet Take 1 tablet (5 mg total) by mouth every 6 (six) hours as needed for up to 10 doses for breakthrough pain. Patient not taking: Reported on 03/20/2022 03/04/22   Lennice Sites, DO  Prenatal Vit-Fe Fumarate-FA (M-NATAL PLUS) 27-1 MG TABS Take 1 tablet by mouth daily. Patient not taking: Reported on 03/20/2022 11/10/21   [provider]  diphenhydrAMINE (BENADRYL) 12.5 MG/5ML liquid Take 12.5 mg by mouth once.  08/01/11  [provider]      Allergies    Philippa Sicks scolymus (artichoke)] and Prednisone    Review of Systems   Review of Systems  Constitutional:  Negative for appetite change, chills and fever.  HENT:  Negative for ear pain and sore throat.   Eyes:  Negative for pain and visual disturbance.  Respiratory:  Negative for cough and shortness of breath.   Cardiovascular:  Negative for chest pain and palpitations.  Gastrointestinal:  Positive for abdominal pain. Negative for diarrhea, nausea and vomiting.  Genitourinary:  Positive for decreased urine volume, difficulty urinating, dysuria, flank pain and urgency. Negative for hematuria, vaginal bleeding, vaginal discharge and vaginal pain.  Musculoskeletal:  Negative for arthralgias and back pain.  Skin:  Negative for color change  and rash.  Neurological:  Negative for dizziness, seizures, syncope, light-headedness and headaches.  All other systems reviewed and are negative.   Physical Exam Updated Vital Signs BP 125/75   Pulse 82   Temp 98.5 F (36.9 C)   Resp 20   Ht '5\' 4"'$  (1.626 m)   Wt 116.1 kg   LMP 04/13/2022 Comment: neg upreg in er today.  SpO2 100%   BMI 43.94 kg/m  Physical Exam Vitals and nursing note reviewed.  Constitutional:      General: She is not in acute distress.    Appearance: Normal appearance. She is not ill-appearing or toxic-appearing.  HENT:     Head: Normocephalic and atraumatic.     Mouth/Throat:     Mouth: Mucous membranes are moist.  Eyes:     General: No scleral icterus.    Conjunctiva/sclera: Conjunctivae normal.  Cardiovascular:     Rate and Rhythm: Normal rate and regular rhythm.     Heart sounds: No murmur heard. Pulmonary:     Effort: Pulmonary effort is normal.     Breath sounds: Normal breath sounds.  Abdominal:     General: Abdomen is flat.     Palpations: Abdomen is soft.     Tenderness: There is no abdominal tenderness. There is no right CVA tenderness, left CVA tenderness, guarding or rebound. Negative signs include Murphy's sign.  Musculoskeletal:        General: Normal range of motion.     Cervical back: Neck supple.     Right lower leg: No edema.     Left lower leg: No edema.  Skin:    General: Skin is warm and dry.     Capillary Refill: Capillary refill takes less than 2 seconds.  Neurological:     Mental Status: She is alert. Mental status is at baseline.  Psychiatric:        Behavior: Behavior normal.     ED Results / Procedures / Treatments   Labs (all labs ordered are listed, but only abnormal results are displayed) Labs Reviewed  URINALYSIS, ROUTINE W REFLEX MICROSCOPIC - Abnormal; Notable for the following components:      Result Value   APPearance HAZY (*)    Hgb urine dipstick MODERATE (*)    Protein, ur 30 (*)    Leukocytes,Ua  LARGE (*)    All other components within normal limits  URINALYSIS, MICROSCOPIC (REFLEX) - Abnormal; Notable for the following components:   Bacteria, UA RARE (*)    All other components within normal limits  PREGNANCY, URINE  CBC WITH DIFFERENTIAL/PLATELET  COMPREHENSIVE METABOLIC PANEL  LIPASE, BLOOD    EKG None  Radiology CT Renal Stone Study  Result Date: 05/07/2022 CLINICAL DATA:  Left flank pain since yesterday, dysuria EXAM: CT ABDOMEN AND PELVIS WITHOUT CONTRAST TECHNIQUE: Multidetector CT imaging of the abdomen and pelvis was performed following the standard protocol without IV contrast. Unenhanced CT was performed per clinician order. Lack of IV contrast limits sensitivity and specificity, especially for evaluation of abdominal/pelvic solid viscera. RADIATION DOSE REDUCTION: This exam was performed  according to the departmental dose-optimization program which includes automated exposure control, adjustment of the mA and/or kV according to patient size and/or use of iterative reconstruction technique. COMPARISON:  06/23/2020 FINDINGS: Lower chest: No acute pleural or parenchymal lung disease. Hepatobiliary: Unremarkable unenhanced appearance of the liver and gallbladder. No biliary duct dilation. Pancreas: Unremarkable unenhanced appearance. Spleen: Unremarkable unenhanced appearance. Adrenals/Urinary Tract: No urinary tract calculi or obstructive uropathy within either kidney. The adrenals and bladder are unremarkable. Stomach/Bowel: No bowel obstruction or ileus. Prior appendectomy. No bowel wall thickening or inflammatory change. Vascular/Lymphatic: No significant vascular findings are present. No enlarged abdominal or pelvic lymph nodes. Reproductive: Heterogeneous enlargement of the uterus consistent with multiple fibroids. No adnexal masses. Other: No free fluid or free intraperitoneal gas. No abdominal wall hernia. Musculoskeletal: No acute or destructive bony lesions. 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 Reconstructed images demonstrate no additional findings. IMPRESSION: 1. No urinary tract calculi or obstructive uropathy. 2. Fibroid uterus. Electronically Signed   By: Randa Ngo M.D.   On: 05/07/2022 17:06    Procedures None  Medications Ordered in ED Medications - No data to display  ED Course/ Medical Decision Making/ A&P                           Medical Decision Making Amount and/or Complexity of Data Reviewed Labs: ordered. Decision-making details documented in ED Course. Radiology: ordered. Decision-making details documented in ED Course.  Risk Prescription drug management.   46 year old female presenting to ED with acute right flank and suprapubic pain in addition to urinary urgency, hesitancy, and dysuria that started today. No abdominal tenderness, rebound, or guarding on exam. She is tolerating PO intake and does not appear dehydrated. Vital signs are stable. UA with moderate blood and large leukocytes. With flank pain and urinary hesitancy, proceeded with CT renal stone to rule out ureterolithiasis, pyelonephritis, hydronephrosis, and other intra-abdominal pathology. No acute CT findings. Pt's pain well controlled on my exam and she has no leukocytosis, fever, CVA tenderness, significant abdominal tenderness, or other concerning findings at this time so will treat for uncomplicated urinary tract infection with 5 day course of Keflex. Pt notes she does get nausea with antibiotics so short course of Zofran also provided. Requested Diflucan for treatment of yeast infection should this occur and it was provided. Given strict instructions for following up with her primary care provider and emergency department return precautions. Pt expressed understanding of plan. All questions answered.          Final Clinical Impression(s) / ED Diagnoses Final diagnoses:  Lower urinary tract infectious disease    Rx /  DC Orders ED Discharge Orders          Ordered    ondansetron (ZOFRAN) 4 MG tablet  Every 8 hours PRN        05/07/22 1848    fluconazole (DIFLUCAN) 150 MG tablet  Daily        05/07/22 1848    cephALEXin (KEFLEX) 250 MG capsule  4 times daily        05/07/22 1848              Suzzette Righter, PA-C 05/07/22 1901    Lennice Sites, DO 05/07/22 2012

## 2022-05-07 NOTE — ED Notes (Signed)
Yesterday began having Rt flank pain intermittently, sharp, non-radiating, is voiding but very little, having urgency, denies fevers or any drainage in vaginal area. Does have burning sensation with urination.

## 2022-05-07 NOTE — ED Triage Notes (Signed)
Pt complains of urinary burning and left flank pain. Pain started last night. Initially thought pain and pressure due to fibroids

## 2022-05-18 ENCOUNTER — Emergency Department (HOSPITAL_BASED_OUTPATIENT_CLINIC_OR_DEPARTMENT_OTHER): Payer: Medicaid Other

## 2022-05-18 ENCOUNTER — Emergency Department (HOSPITAL_BASED_OUTPATIENT_CLINIC_OR_DEPARTMENT_OTHER)
Admission: EM | Admit: 2022-05-18 | Discharge: 2022-05-18 | Disposition: A | Discharge status: Home / Self Care | Payer: Medicaid Other | Attending: Emergency Medicine | Admitting: Emergency Medicine

## 2022-05-18 ENCOUNTER — Other Ambulatory Visit: Payer: Self-pay

## 2022-05-18 ENCOUNTER — Encounter (HOSPITAL_BASED_OUTPATIENT_CLINIC_OR_DEPARTMENT_OTHER): Payer: Self-pay | Admitting: Emergency Medicine

## 2022-05-18 DIAGNOSIS — D259 Leiomyoma of uterus, unspecified: Secondary | ICD-10-CM | POA: Insufficient documentation

## 2022-05-18 DIAGNOSIS — R109 Unspecified abdominal pain: Secondary | ICD-10-CM | POA: Diagnosis present

## 2022-05-18 DIAGNOSIS — D219 Benign neoplasm of connective and other soft tissue, unspecified: Secondary | ICD-10-CM

## 2022-05-18 DIAGNOSIS — R1031 Right lower quadrant pain: Secondary | ICD-10-CM | POA: Diagnosis not present

## 2022-05-18 LAB — CBC WITH DIFFERENTIAL/PLATELET
Abs Immature Granulocytes: 0.01 10*3/uL (ref 0.00–0.07)
Basophils Absolute: 0 10*3/uL (ref 0.0–0.1)
Basophils Relative: 1 %
Eosinophils Absolute: 0.1 10*3/uL (ref 0.0–0.5)
Eosinophils Relative: 3 %
HCT: 37.7 % (ref 36.0–46.0)
Hemoglobin: 12 g/dL (ref 12.0–15.0)
Immature Granulocytes: 0 %
Lymphocytes Relative: 37 %
Lymphs Abs: 1.6 10*3/uL (ref 0.7–4.0)
MCH: 27.7 pg (ref 26.0–34.0)
MCHC: 31.8 g/dL (ref 30.0–36.0)
MCV: 87.1 fL (ref 80.0–100.0)
Monocytes Absolute: 0.4 10*3/uL (ref 0.1–1.0)
Monocytes Relative: 10 %
Neutro Abs: 2.2 10*3/uL (ref 1.7–7.7)
Neutrophils Relative %: 49 %
Platelets: 352 10*3/uL (ref 150–400)
RBC: 4.33 MIL/uL (ref 3.87–5.11)
RDW: 15.1 % (ref 11.5–15.5)
WBC: 4.3 10*3/uL (ref 4.0–10.5)
nRBC: 0 % (ref 0.0–0.2)

## 2022-05-18 LAB — COMPREHENSIVE METABOLIC PANEL
ALT: 12 U/L (ref 0–44)
AST: 22 U/L (ref 15–41)
Albumin: 3.2 g/dL — ABNORMAL LOW (ref 3.5–5.0)
Alkaline Phosphatase: 44 U/L (ref 38–126)
Anion gap: 6 (ref 5–15)
BUN: 18 mg/dL (ref 6–20)
CO2: 25 mmol/L (ref 22–32)
Calcium: 8.6 mg/dL — ABNORMAL LOW (ref 8.9–10.3)
Chloride: 105 mmol/L (ref 98–111)
Creatinine, Ser: 1.09 mg/dL — ABNORMAL HIGH (ref 0.44–1.00)
GFR, Estimated: >60 mL/min (ref >60)
Glucose, Bld: 101 mg/dL — ABNORMAL HIGH (ref 70–99)
Potassium: 5.1 mmol/L (ref 3.5–5.1)
Sodium: 136 mmol/L (ref 135–145)
Total Bilirubin: 0.5 mg/dL (ref 0.3–1.2)
Total Protein: 6.6 g/dL (ref 6.5–8.1)

## 2022-05-18 LAB — URINALYSIS, ROUTINE W REFLEX MICROSCOPIC
Bilirubin Urine: NEGATIVE
Glucose, UA: NEGATIVE mg/dL
Ketones, ur: NEGATIVE mg/dL
Leukocytes,Ua: NEGATIVE
Nitrite: NEGATIVE
Protein, ur: NEGATIVE mg/dL
Specific Gravity, Urine: 1.025 (ref 1.005–1.030)
pH: 5.5 (ref 5.0–8.0)

## 2022-05-18 LAB — PREGNANCY, URINE: Preg Test, Ur: NEGATIVE

## 2022-05-18 LAB — URINALYSIS, MICROSCOPIC (REFLEX)

## 2022-05-18 LAB — LIPASE, BLOOD: Lipase: 40 U/L (ref 11–51)

## 2022-05-18 MED ORDER — SODIUM CHLORIDE 0.9 % IV BOLUS
1000.0000 mL | Freq: Once | INTRAVENOUS | Status: AC
  Administered 2022-05-18: 1000 mL via INTRAVENOUS

## 2022-05-18 MED ORDER — OXYCODONE HCL 5 MG PO TABS: 5.0000 mg | ORAL_TABLET | ORAL | 0 refills | Status: DC | PRN

## 2022-05-18 MED ORDER — IBUPROFEN 600 MG PO TABS: 600.0000 mg | ORAL_TABLET | Freq: Four times a day (QID) | ORAL | 0 refills | Status: DC | PRN

## 2022-05-18 MED ORDER — ACETAMINOPHEN 325 MG PO TABS: 650.0000 mg | ORAL_TABLET | Freq: Four times a day (QID) | ORAL | 0 refills | Status: DC | PRN

## 2022-05-18 MED ORDER — KETOROLAC TROMETHAMINE 30 MG/ML IJ SOLN
30.0000 mg | Freq: Once | INTRAMUSCULAR | Status: AC
  Administered 2022-05-18: 30 mg via INTRAVENOUS
  Filled 2022-05-18: qty 1

## 2022-05-18 MED ORDER — IOHEXOL 300 MG/ML  SOLN
100.0000 mL | Freq: Once | INTRAMUSCULAR | Status: AC | PRN
  Administered 2022-05-18: 100 mL via INTRAVENOUS

## 2022-05-18 MED ORDER — ONDANSETRON HCL 4 MG/2ML IJ SOLN
4.0000 mg | Freq: Once | INTRAMUSCULAR | Status: AC
  Administered 2022-05-18: 4 mg via INTRAVENOUS
  Filled 2022-05-18: qty 2

## 2022-05-18 NOTE — ED Provider Notes (Signed)
[facility] EMERGENCY DEPARTMENT Provider Note  CSN: [id number] Arrival date & time: 05/18/22 0751  Chief Complaint(s) Abdominal Pain  HPI Melissa [name] is a 47 y.o. female with past medical history as below, significant for fibroid, PCOS, appendiceal mucinous neoplasm s/p neoplasm, DUB, ectopic pregnancy who presents to the ED with complaint of abd pain. Currently menstruating. Pt reports worsening abdominal lower pain over the past few months. Has hx fibroids and was recommended to have surgery however she delayed this to seek a second opinion which she has not completed yet. She follows with [facility] Dr Renold Don. She reports pain persisting and gradually worsening. She trialed apap but denies improvement, also took motrin without much relief x1. She has no n/v, no fevers or chills, no pain with urination, no brbpr or melena. She is currently mensurating and is having more bleeding than normal, hx DUB. She has intermittent constipation unchanged from typical.   Past Medical History Past Medical History:  Diagnosis Date   Abnormal Pap smear 08/30/2008   LGSIL   Allergy    BV (bacterial vaginosis)    recurring   Chlamydia    Chronic pain of left knee    Ectopic pregnancy    Fibroid    Infertility    Secondary PCOS   PCOS (polycystic ovarian syndrome)    Shingles outbreak 11/2011   Vertigo    Patient Active Problem List   Diagnosis Date Noted   DUB (dysfunctional uterine bleeding) 12/29/2021   Grief reaction 12/06/2021   Encounter for fertility planning 03/21/2021   S/P appendectomy 10/26/2020   External hemorrhoids 07/19/2020   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   Mixed conductive and sensorineural hearing loss of right ear with restricted hearing of left ear 08/10/2016   Uterine fibroids, antepartum condition or complication 26/37/8588   Fibroid 08/30/2011   Obesity 12/21/2010   Home Medication(s) Prior to Admission  medications   Medication Sig Start Date End Date Taking? Authorizing Provider  acetaminophen (TYLENOL) 325 MG tablet Take 2 tablets (650 mg total) by mouth every 6 (six) hours as needed. 05/18/22  Yes Wynona Dove A, DO  ibuprofen (ADVIL) 600 MG tablet Take 1 tablet (600 mg total) by mouth every 6 (six) hours as needed. 05/18/22  Yes Wynona Dove A, DO  oxyCODONE (ROXICODONE) 5 MG immediate release tablet Take 1 tablet (5 mg total) by mouth every 4 (four) hours as needed for severe pain. 05/18/22  Yes Wynona Dove A, DO  amoxicillin-clavulanate (AUGMENTIN) 875-125 MG tablet Take 1 tablet by mouth 2 (two) times daily. for 7 days 03/01/22   [provider]  chlorhexidine (PERIDEX) 0.12 % solution 2 (two) times daily. 03/06/22   [provider]  clobetasol cream (TEMOVATE) 0.05 % Apply topically 2 (two) times daily. 03/01/22   [provider]  docusate sodium (COLACE) 100 MG capsule Take 1 capsule (100 mg total) by mouth every 12 (twelve) hours. Patient not taking: Reported on 03/20/2022 03/06/22   Mickie Hillier, PA-C  fluconazole (DIFLUCAN) 150 MG tablet Take 150 mg by mouth every 3 (three) days. 03/09/22   [provider]  fluconazole (DIFLUCAN) 150 MG tablet Take 1 tablet (150 mg total) by mouth daily. 05/07/22   Gowens, Mariah L, PA-C  hydrOXYzine (ATARAX) 25 MG tablet Take 1 tablet (25 mg total) by mouth every 8 (eight) hours as needed. Patient not taking: Reported on 03/20/2022 12/10/21   Quintella Reichert, MD  ibuprofen (ADVIL) 800 MG tablet Take 800 mg  by mouth every 8 (eight) hours as needed. 03/06/22   [provider]  Lidocaine HCl 2 % CREA Apply 1 Application topically 2 (two) times daily. 03/06/22   Mickie Hillier, PA-C  loratadine (CLARITIN) 10 MG tablet Take 1 tablet by mouth daily.    [provider]  Prenatal Vit-Fe Fumarate-FA (M-NATAL PLUS) 27-1 MG TABS Take 1 tablet by mouth daily. Patient not taking: Reported on 03/20/2022 11/10/21    [provider]  diphenhydrAMINE (BENADRYL) 12.5 MG/5ML liquid Take 12.5 mg by mouth once.  08/01/11  [provider]                                                                                                                                    Past Surgical History Past Surgical History:  Procedure Laterality Date   APPENDECTOMY     DILATION AND CURETTAGE OF UTERUS  06/2011   ectopic   EAR TUBE REMOVAL     ECTOPIC PREGNANCY SURGERY     LAPAROSCOPY  07/05/2011   Procedure: LAPAROSCOPY OPERATIVE;  Surgeon: Osborne Oman, MD;  Location: [facility];  Service: Gynecology;  Laterality: N/A;   right ear surgery     WISDOM TOOTH EXTRACTION     Family History Family History  Problem Relation Age of Onset   Diabetes Mother    Hypertension Mother    Cancer Father    Fibroids Sister    Hypertension Sister    Crohn's disease Brother    Cancer Brother    Diabetes Brother    Hypertension Brother    Kidney disease Brother    Breast cancer Maternal Aunt    Breast cancer Paternal Aunt    Anesthesia problems Neg Hx     Social History Social History   Tobacco Use   Smoking status: Never    Passive exposure: Never   Smokeless tobacco: Never  Vaping Use   Vaping Use: Never used  Substance Use Topics   Alcohol use: No   Drug use: No   Allergies Artichoke [cynara scolymus (artichoke)] and Prednisone  Review of Systems Review of Systems  Constitutional:  Negative for chills and fever.  HENT:  Negative for facial swelling and trouble swallowing.   Eyes:  Negative for photophobia and visual disturbance.  Respiratory:  Negative for cough and shortness of breath.   Cardiovascular:  Negative for chest pain and palpitations.  Gastrointestinal:  Positive for abdominal pain. Negative for nausea and vomiting.  Endocrine: Negative for polydipsia and polyuria.  Genitourinary:  Positive for vaginal bleeding. Negative for difficulty urinating and hematuria.  Musculoskeletal:   Negative for gait problem and joint swelling.  Skin:  Negative for pallor and rash.  Neurological:  Negative for syncope and headaches.  Psychiatric/Behavioral:  Negative for agitation and confusion.     Physical Exam Vital Signs  I have reviewed the triage vital signs BP (!) 153/88   Pulse Marland Kitchen)  53   Temp 98.1 F (36.7 C)   Resp 18   Ht '5\' 4"'$  (1.626 m)   Wt 116.1 kg   LMP 05/15/2022 Comment: neg upreg in er today.  SpO2 100%   BMI 43.94 kg/m  Physical Exam Vitals and nursing note reviewed.  Constitutional:      General: She is not in acute distress.    Appearance: Normal appearance. She is well-developed. She is obese.  HENT:     Head: Normocephalic and atraumatic.     Right Ear: External ear normal.     Left Ear: External ear normal.     Nose: Nose normal.     Mouth/Throat:     Mouth: Mucous membranes are moist.  Eyes:     General: No scleral icterus.       Right eye: No discharge.        Left eye: No discharge.  Cardiovascular:     Rate and Rhythm: Normal rate and regular rhythm.     Pulses: Normal pulses.     Heart sounds: Normal heart sounds.  Pulmonary:     Effort: Pulmonary effort is normal. No respiratory distress.     Breath sounds: Normal breath sounds.  Abdominal:     General: Abdomen is flat.     Palpations: Abdomen is soft.     Tenderness: There is abdominal tenderness in the right lower quadrant, suprapubic area and left lower quadrant. There is no guarding or rebound.  Musculoskeletal:        General: Normal range of motion.     Cervical back: Normal range of motion.     Right lower leg: No edema.     Left lower leg: No edema.  Skin:    General: Skin is warm and dry.     Capillary Refill: Capillary refill takes less than 2 seconds.  Neurological:     Mental Status: She is alert and oriented to person, place, and time.     GCS: GCS eye subscore is 4. GCS verbal subscore is 5. GCS motor subscore is 6.  Psychiatric:        Mood and Affect: Mood  normal.        Behavior: Behavior normal.     ED Results and Treatments Labs (all labs ordered are listed, but only abnormal results are displayed) Labs Reviewed  URINALYSIS, ROUTINE W REFLEX MICROSCOPIC - Abnormal; Notable for the following components:      Result Value   Hgb urine dipstick LARGE (*)    All other components within normal limits  URINALYSIS, MICROSCOPIC (REFLEX) - Abnormal; Notable for the following components:   Bacteria, UA FEW (*)    All other components within normal limits  COMPREHENSIVE METABOLIC PANEL - Abnormal; Notable for the following components:   Glucose, Bld 101 (*)    Creatinine, Ser 1.09 (*)    Calcium 8.6 (*)    Albumin 3.2 (*)    All other components within normal limits  PREGNANCY, URINE  CBC WITH DIFFERENTIAL/PLATELET  LIPASE, BLOOD  Radiology CT ABDOMEN PELVIS W CONTRAST  Result Date: 05/18/2022 CLINICAL DATA:  Lower abdominal pain. History of fibroids and appendiceal tumor. EXAM: CT ABDOMEN AND PELVIS WITH CONTRAST TECHNIQUE: Multidetector CT imaging of the abdomen and pelvis was performed using the standard protocol following bolus administration of intravenous contrast. RADIATION DOSE REDUCTION: This exam was performed according to the departmental dose-optimization program which includes automated exposure control, adjustment of the mA and/or kV according to patient size and/or use of iterative reconstruction technique. CONTRAST:  162m OMNIPAQUE IOHEXOL 300 MG/ML  SOLN COMPARISON:  CT abdomen/pelvis 05/07/2022 FINDINGS: Lower chest: The lung bases are clear. The imaged heart is unremarkable. Hepatobiliary: The liver and gallbladder are unremarkable. There is no biliary ductal dilatation. Pancreas: Unremarkable. Spleen: Unremarkable. Adrenals/Urinary Tract: A subcentimeter right adrenal nodule is unchanged going back to 2018,  likely a benign adenoma requiring no specific imaging follow-up (2-26). The left adrenal is unremarkable. The kidneys are unremarkable, with no focal lesion, stone, hydronephrosis, or hydroureter. There is mass effect on the bladder by the large uterine fibroids. The bladder is otherwise unremarkable. Stomach/Bowel: The stomach is unremarkable. There is no evidence of bowel obstruction. There is no abnormal bowel wall thickening or inflammatory change. The appendix is surgically absent. There is no abnormal soft tissue mass at the appendectomy site. Vascular/Lymphatic: The abdominal aorta is normal in course and caliber. The major branch vessels are patent. The main portal and splenic veins are patent. There is no abdominal or pelvic lymphadenopathy. Reproductive: Uterus is markedly enlarged and heterogeneous consistent with large fibroids, similar to the prior study. No adnexal mass is seen. Other: There is trace free fluid in the pelvis, nonspecific and unchanged. There is no free intraperitoneal air. Musculoskeletal: There is fusion of the lower thoracic vertebral bodies and facet joints, unchanged. The SI joints appear normal. There is no acute osseous abnormality or suspicious osseous lesion. IMPRESSION: 1. No acute findings in the abdomen or pelvis to explain the patient's pain. 2. Large fibroid uterus with mass effect on the bladder, unchanged. Electronically Signed   By: PValetta MoleM.D.   On: 05/18/2022 10:50    Pertinent labs & imaging results that were available during my care of the patient were reviewed by me and considered in my medical decision making (see MDM for details).  Medications Ordered in ED Medications  ketorolac (TORADOL) 30 MG/ML injection 30 mg (30 mg Intravenous Given 05/18/22 0924)  sodium chloride 0.9 % bolus 1,000 mL (0 mLs Intravenous Stopped 05/18/22 1049)  iohexol (OMNIPAQUE) 300 MG/ML solution 100 mL (100 mLs Intravenous Contrast Given 05/18/22 1018)  ondansetron (ZOFRAN)  injection 4 mg (4 mg Intravenous Given 05/18/22 1006)                                                                                                                                     Procedures Procedures  (including critical care time)  Medical Decision Making /  ED Course   MDM:  Landen Breeland is a 47 y.o. female with past medical history as below, significant for fibroid, PCOS, appendiceal mucinous neoplasm s/p neoplasm, DUB, ectopic pregnancy who presents to the ED with complaint of abd pain.. The complaint involves an extensive differential diagnosis and also carries with it a high risk of complications and morbidity.  Serious etiology was considered. Ddx includes but is not limited to: Differential diagnosis includes but is not exclusive to ectopic pregnancy, ovarian cyst, ovarian torsion, acute appendicitis, urinary tract infection, endometriosis, bowel obstruction, hernia, colitis, renal colic, gastroenteritis, volvulus etc.   On initial assessment the patient is: resting comfortably, HDS on ambient air. Will collect screening labs, give ivf and toradol. Pt with hx appendiceal neoplasm and worsening abd pain, will get CTAP. Will re-assess after meds/labs Vital signs and nursing notes were reviewed    Labs reviewed and were stable Urine in UA, she is menstruating. CT with large fibroids, o/w wnl She is feeling better after toradol / zofran after re-check Tolerating PO wnl Will send her home with further analgesia rx and advised her to f/u with her obgyn for further care She has appt next week per pt  The patient improved significantly and was discharged in stable condition. Detailed discussions were had with the patient regarding current findings, and need for close f/u with PCP or on call doctor. The patient has been instructed to return immediately if the symptoms worsen in any way for re-evaluation. Patient verbalized understanding and is in agreement with current care plan. All  questions answered prior to discharge.    Additional history obtained: -Additional history obtained from na -External records from outside source obtained and reviewed including: Chart review including previous notes, labs, imaging, consultation notes including primary care documentation, prior labs/imaging/prior ed visits , home meds    Lab Tests: -I ordered, reviewed, and interpreted labs.   The pertinent results include:   Labs Reviewed  URINALYSIS, ROUTINE W REFLEX MICROSCOPIC - Abnormal; Notable for the following components:      Result Value   Hgb urine dipstick LARGE (*)    All other components within normal limits  URINALYSIS, MICROSCOPIC (REFLEX) - Abnormal; Notable for the following components:   Bacteria, UA FEW (*)    All other components within normal limits  COMPREHENSIVE METABOLIC PANEL - Abnormal; Notable for the following components:   Glucose, Bld 101 (*)    Creatinine, Ser 1.09 (*)    Calcium 8.6 (*)    Albumin 3.2 (*)    All other components within normal limits  PREGNANCY, URINE  CBC WITH DIFFERENTIAL/PLATELET  LIPASE, BLOOD    Notable for stable labs  EKG   EKG Interpretation  Date/Time:    Ventricular Rate:    PR Interval:    QRS Duration:   QT Interval:    QTC Calculation:   R Axis:     Text Interpretation:           Imaging Studies ordered: I ordered imaging studies including CTAP I independently visualized the following imaging with scope of interpretation limited to determining acute life threatening conditions related to emergency care: CTAP, which revealed large fibroid uterus, no change I independently visualized and interpreted imaging. I agree with the radiologist interpretation   Medicines ordered and prescription drug management: Meds ordered this encounter  Medications   ketorolac (TORADOL) 30 MG/ML injection 30 mg   sodium chloride 0.9 % bolus 1,000 mL   iohexol (OMNIPAQUE) 300 MG/ML solution 100 mL  ondansetron  (ZOFRAN) injection 4 mg   oxyCODONE (ROXICODONE) 5 MG immediate release tablet    Sig: Take 1 tablet (5 mg total) by mouth every 4 (four) hours as needed for severe pain.    Dispense:  12 tablet    Refill:  0   ibuprofen (ADVIL) 600 MG tablet    Sig: Take 1 tablet (600 mg total) by mouth every 6 (six) hours as needed.    Dispense:  30 tablet    Refill:  0   acetaminophen (TYLENOL) 325 MG tablet    Sig: Take 2 tablets (650 mg total) by mouth every 6 (six) hours as needed.    Dispense:  36 tablet    Refill:  0    -I have reviewed the patients home medicines and have made adjustments as needed   Consultations Obtained: I requested consultation with the na,  and discussed lab and imaging findings as well as pertinent plan - they recommend: na   Cardiac Monitoring: The patient was maintained on a cardiac monitor.  I personally viewed and interpreted the cardiac monitored which showed an underlying rhythm of: nsr  Social Determinants of Health:  Diagnosis or treatment significantly limited by social determinants of health: obesity   Reevaluation: After the interventions noted above, I reevaluated the patient and found that they have improved  Co morbidities that complicate the patient evaluation  Past Medical History:  Diagnosis Date   Abnormal Pap smear 08/30/2008   LGSIL   Allergy    BV (bacterial vaginosis)    recurring   Chlamydia    Chronic pain of left knee    Ectopic pregnancy    Fibroid    Infertility    Secondary PCOS   PCOS (polycystic ovarian syndrome)    Shingles outbreak 11/2011   Vertigo       Dispostion: Disposition decision including need for hospitalization was considered, and patient discharged from emergency department.    Final Clinical Impression(s) / ED Diagnoses Final diagnoses:  Fibroid  Abdominal pain, unspecified abdominal location     This chart was dictated using voice recognition software.  Despite best efforts to proofread,   errors can occur which can change the documentation meaning.    Wynona Dove A, DO 05/18/22 1108

## 2022-05-18 NOTE — ED Notes (Signed)
Pt understood discharge instructions and verbalized understanding

## 2022-05-18 NOTE — Discharge Instructions (Addendum)
It was a pleasure caring for you today in the emergency department.  Please return to the emergency department for any worsening or worrisome symptoms.  Please follow up with your OBGYN In regards to large fibroids

## 2022-05-18 NOTE — ED Triage Notes (Signed)
Pt arrives pov, steady gait c/o lower abdominal pain and lower back pain. Endorses hx of fibroid cysts.

## 2022-05-31 ENCOUNTER — Other Ambulatory Visit: Payer: Self-pay

## 2022-05-31 ENCOUNTER — Ambulatory Visit: Payer: Medicaid Other | Admitting: Obstetrics and Gynecology

## 2022-05-31 ENCOUNTER — Encounter: Payer: Self-pay | Admitting: Obstetrics and Gynecology

## 2022-05-31 VITALS — BP 117/78 | HR 86 | Ht 64.0 in | Wt 256.1 lb

## 2022-05-31 DIAGNOSIS — N938 Other specified abnormal uterine and vaginal bleeding: Secondary | ICD-10-CM

## 2022-05-31 DIAGNOSIS — D219 Benign neoplasm of connective and other soft tissue, unspecified: Secondary | ICD-10-CM

## 2022-05-31 DIAGNOSIS — R102 Pelvic and perineal pain unspecified side: Secondary | ICD-10-CM | POA: Insufficient documentation

## 2022-05-31 NOTE — Progress Notes (Signed)
   CC: ER follow up Subjective:    Patient ID: Melissa [name], female    DOB: July 27, 1975, 47 y.o.   MRN: [id number]  HPI 47 yo G6P3 seen as ER follow up.  She has had abdominal pain for approximately 3 months along with irregular menstrual bleeding for the last year.  The pain is throbbing and constant.  She does have nausea but no vomiting. The patient denies dyspareunia.  There is no dysuria.  Pt does have some back pain and lower abdominal pain underneath her pannus.  She does have a vague sensation of pressure.   Review of Systems     Objective:   Physical Exam Vitals:   05/31/22 0953  BP: 117/78  Pulse: 86   Abdomen: obese, nondistended, tender in the suprapubic area.  No obvious masses.  Pain increased with movement of lower extremities.  Pt declined pelvic exam.      Assessment & Plan:   1. DUB (dysfunctional uterine bleeding) PT has had irregular menses where she usually skips a month or two and then has a period lasting 7-9 days  2. Fibroids Discussed treatment options including Sonata procedure, UFE, myfembree.  Pt initially wanted myomectomy, but if she is to have surgery at 46 it may be more prudent to have definitive therapy.  Pt desires Sonata.  Will schedule.  [employer] form signed today.  3. Pelvic pain:  Unsure if pain is from fibroids.  She had no pain when seen earlier in the year.  Will get second opinion from Dr. Okey Dupre regarding if pain is coming from the reproductive organs or another site.    Griffin Basil, MD Faculty Attending, [facility]

## 2022-06-15 ENCOUNTER — Ambulatory Visit: Payer: Medicaid Other | Admitting: Obstetrics and Gynecology

## 2022-06-19 ENCOUNTER — Telehealth: Payer: Self-pay

## 2022-06-19 NOTE — Telephone Encounter (Signed)
Called patient to discuss potential surgery dates, patient opted for 4/23.

## 2022-06-25 ENCOUNTER — Ambulatory Visit: Payer: Medicaid Other | Admitting: Obstetrics and Gynecology

## 2022-06-26 DIAGNOSIS — B349 Viral infection, unspecified: Secondary | ICD-10-CM | POA: Diagnosis not present

## 2022-07-05 DIAGNOSIS — R0981 Nasal congestion: Secondary | ICD-10-CM | POA: Diagnosis not present

## 2022-07-05 DIAGNOSIS — J069 Acute upper respiratory infection, unspecified: Secondary | ICD-10-CM | POA: Diagnosis not present

## 2022-07-05 DIAGNOSIS — J029 Acute pharyngitis, unspecified: Secondary | ICD-10-CM | POA: Diagnosis not present

## 2022-07-05 DIAGNOSIS — R051 Acute cough: Secondary | ICD-10-CM | POA: Diagnosis not present

## 2022-07-10 DIAGNOSIS — J020 Streptococcal pharyngitis: Secondary | ICD-10-CM | POA: Diagnosis not present

## 2022-07-10 DIAGNOSIS — R519 Headache, unspecified: Secondary | ICD-10-CM | POA: Diagnosis not present

## 2022-07-10 DIAGNOSIS — R110 Nausea: Secondary | ICD-10-CM | POA: Diagnosis not present

## 2022-07-10 DIAGNOSIS — R630 Anorexia: Secondary | ICD-10-CM | POA: Diagnosis not present

## 2022-07-10 DIAGNOSIS — H9203 Otalgia, bilateral: Secondary | ICD-10-CM | POA: Diagnosis not present

## 2022-07-10 DIAGNOSIS — R5383 Other fatigue: Secondary | ICD-10-CM | POA: Diagnosis not present

## 2022-07-13 DIAGNOSIS — J019 Acute sinusitis, unspecified: Secondary | ICD-10-CM | POA: Diagnosis not present

## 2022-07-26 ENCOUNTER — Other Ambulatory Visit: Payer: Self-pay

## 2022-07-26 ENCOUNTER — Ambulatory Visit (INDEPENDENT_AMBULATORY_CARE_PROVIDER_SITE_OTHER): Payer: Medicaid Other | Admitting: Obstetrics and Gynecology

## 2022-07-26 ENCOUNTER — Encounter: Payer: Self-pay | Admitting: Obstetrics and Gynecology

## 2022-07-26 VITALS — BP 128/77 | HR 79 | Ht 64.0 in | Wt 248.3 lb

## 2022-07-26 DIAGNOSIS — R102 Pelvic and perineal pain: Secondary | ICD-10-CM

## 2022-07-26 DIAGNOSIS — Z1239 Encounter for other screening for malignant neoplasm of breast: Secondary | ICD-10-CM | POA: Diagnosis not present

## 2022-07-26 DIAGNOSIS — D219 Benign neoplasm of connective and other soft tissue, unspecified: Secondary | ICD-10-CM

## 2022-07-26 NOTE — Progress Notes (Signed)
GYNECOLOGY VISIT  Patient name: Melissa [name] MRN [id number]  Date of birth: 1975-05-31 Chief Complaint:   Consult  History:  Melissa [name] is a 47 y.o. [phone] being seen today for pelvic pain. Pain started after SAB, daily pain but now having mild pressure sensation in her pelvis, pain has resolved Last time she felt pain was during her periods Had severe pain for months following miscarriage in the summer Started takign OTC supplementation that was supposed ot help shrink fibroids and taking ibuprofen and heating pad and then started ot decrease and just w No pain with intercourse May have pain with BM but n pian with voiding  Upcoming Elgie [facility] - sonata scheduled      Past Medical History:  Diagnosis Date   Abnormal Pap smear 08/30/2008   LGSIL   Allergy    BV (bacterial vaginosis)    recurring   Chlamydia    Chronic pain of left knee    Ectopic pregnancy    Fibroid    Infertility    Secondary PCOS   PCOS (polycystic ovarian syndrome)    Shingles outbreak 11/2011   Vertigo     Past Surgical History:  Procedure Laterality Date   APPENDECTOMY  2022   DILATION AND CURETTAGE OF UTERUS  06/2011   ectopic   EAR TUBE REMOVAL     ECTOPIC PREGNANCY SURGERY  2013   LAPAROSCOPY  07/05/2011   Procedure: LAPAROSCOPY OPERATIVE;  Surgeon: Osborne Oman, MD;  Location: [facility];  Service: Gynecology;  Laterality: N/A;   right ear surgery Right 2011   WISDOM TOOTH EXTRACTION      The following portions of the patient's history were reviewed and updated as appropriate: allergies, current medications, past family history, past medical history, past social history, past surgical history and problem list.   Health Maintenance:   Last pap     Component Value Date/Time   DIAGPAP  03/16/2019 1105    - Negative for intraepithelial lesion or malignancy (NILM)   Eva Negative 03/16/2019 1105   ADEQPAP  03/16/2019 1105    Satisfactory for evaluation; transformation zone  component PRESENT.   Last mammogram: 09/2021 BIRADS 1   Review of Systems:  Pertinent items are noted in HPI. Comprehensive review of systems was otherwise negative.   Objective:  Physical Exam BP 128/77   Pulse 79   Ht '5\' 4"'$  (1.626 m)   Wt 248 lb 4.8 oz (112.6 kg)   LMP 07/08/2022 (Exact Date)   BMI 42.62 kg/m    Physical Exam Vitals and nursing note reviewed. Exam conducted with a chaperone present.  Constitutional:      Appearance: Normal appearance.  HENT:     Head: Normocephalic and atraumatic.  Pulmonary:     Effort: Pulmonary effort is normal.     Breath sounds: Normal breath sounds.  Genitourinary:    General: Normal vulva.     Exam position: Lithotomy position.     Vagina: Normal.     Cervix: Normal.     Comments: Normal appearing vulva Normal vulvar sensation bilaterally Nontender superficial pelvic floor muscles Nontender ischial tuberosities bilaterally  Allodynia at introitus: No  Anal wink not present Posterior vaginal wall nontender Right levator ani 1/10 Right ischiococcygeous 1/10 Right obturator internus 1/10 Left levator ani 1/10 Left ischioccocygeous 1/10 Left obturator internus 1/10 Anterior vaginal wall nontender Uterine tenderness tender - enlarged with anterior fullness/firmness   Skin:    General: Skin is warm and dry.  Neurological:  General: No focal deficit present.     Mental Status: She is alert.  Psychiatric:        Mood and Affect: Mood normal.        Behavior: Behavior normal.        Thought Content: Thought content normal.        Judgment: Judgment normal.     Labs and Imaging No results found.     Assessment & Plan:   1. Fibroids Scheduled for outpatient sonata procedure Reviewed fertility goals - would prefer to try on her own w/ fiance, aware that spontaneous pregnancy less likely at her current age and any pregnancy at this age would be considered high risk as well Discussed that results following  procedure likely to be over weeks to months to appreciate a difference Location of pressure likely secondary to location of anterior fibroid location and suspect should be relieved with successful treatment of the fibroid   2. Pelvic pain in female No pelvic floor pain on exam - only uterine tenderness May have had brief episode of muscle spasm vs degenerating fibroid last year and pain has now resolved  3. Breast screening Mammo ordered - MM 3D SCREENING MAMMOGRAM BILATERAL BREAST; Future    Routine preventative health maintenance measures emphasized.  Darliss Cheney, MD Minimally Invasive Gynecologic Surgery [facility]

## 2022-08-06 DIAGNOSIS — Z9049 Acquired absence of other specified parts of digestive tract: Secondary | ICD-10-CM | POA: Diagnosis not present

## 2022-08-06 DIAGNOSIS — D259 Leiomyoma of uterus, unspecified: Secondary | ICD-10-CM | POA: Diagnosis not present

## 2022-08-06 DIAGNOSIS — D373 Neoplasm of uncertain behavior of appendix: Secondary | ICD-10-CM | POA: Diagnosis not present

## 2022-08-23 ENCOUNTER — Encounter (HOSPITAL_BASED_OUTPATIENT_CLINIC_OR_DEPARTMENT_OTHER): Payer: Self-pay | Admitting: Obstetrics and Gynecology

## 2022-08-27 ENCOUNTER — Encounter: Payer: Self-pay | Admitting: *Deleted

## 2022-08-28 ENCOUNTER — Encounter (HOSPITAL_BASED_OUTPATIENT_CLINIC_OR_DEPARTMENT_OTHER): Payer: Self-pay | Admitting: Obstetrics and Gynecology

## 2022-08-28 ENCOUNTER — Other Ambulatory Visit: Payer: Self-pay

## 2022-08-28 NOTE — Progress Notes (Signed)
Spoke w/ via phone for pre-op interview---pt Lab needs dos----     cbc, t & s, urine preg          Lab results------EKG 12-09-2021  on chart COVID test -----patient states asymptomatic no test needed Arrive at -------1245 pm 09-04-2022 NPO after MN NO Solid Food.  Clear liquids from MN until---1145 am Med rec completed Medications to take morning of surgery -----claritin d Diabetic medication -----n/a Patient instructed no nail polish to be worn day of surgery Pt instructed to bring insurnace card and photo id   Driver/caregiver david cole jr boyfriend or brother [place] [employer] forces training and education officer  for 24 hours after surgery  Patient Special Instructions -----none Pre-Op special Istructions -----none Patient verbalized understanding of instructions that were given at this phone interview. Patient denies shortness of breath, chest pain, fever, cough at this phone interview.

## 2022-08-29 ENCOUNTER — Encounter: Payer: Self-pay | Admitting: Obstetrics and Gynecology

## 2022-08-29 ENCOUNTER — Other Ambulatory Visit (HOSPITAL_COMMUNITY)
Admission: RE | Admit: 2022-08-29 | Discharge: 2022-08-29 | Disposition: A | Discharge status: Home / Self Care | Payer: Medicaid Other | Source: Ambulatory Visit | Attending: Obstetrics and Gynecology | Admitting: Obstetrics and Gynecology

## 2022-08-29 ENCOUNTER — Ambulatory Visit (INDEPENDENT_AMBULATORY_CARE_PROVIDER_SITE_OTHER): Payer: Medicaid Other | Admitting: Obstetrics and Gynecology

## 2022-08-29 VITALS — BP 123/81 | HR 66 | Ht 64.0 in | Wt 252.5 lb

## 2022-08-29 DIAGNOSIS — Z124 Encounter for screening for malignant neoplasm of cervix: Secondary | ICD-10-CM

## 2022-08-29 DIAGNOSIS — Z01818 Encounter for other preprocedural examination: Secondary | ICD-10-CM | POA: Diagnosis not present

## 2022-08-29 DIAGNOSIS — N938 Other specified abnormal uterine and vaginal bleeding: Secondary | ICD-10-CM | POA: Diagnosis not present

## 2022-08-29 DIAGNOSIS — O3410 Maternal care for benign tumor of corpus uteri, unspecified trimester: Secondary | ICD-10-CM

## 2022-08-29 MED ORDER — MISOPROSTOL 100 MCG PO TABS
ORAL_TABLET | ORAL | 0 refills | Status: DC
Start: 2022-08-29 — End: 2022-09-03

## 2022-08-29 NOTE — Progress Notes (Signed)
OB/GYN Pre-Op History and Physical  Melissa [name] is a 46 y.o. [id number] presenting for preoperative visit.  Pt is scheduled for Sonata fibroid ablation.  Risks and benefits of the procedure given including bleeding, infection, involvement of other organs as well as uterine perforation.  Procedure discussed in detail and all questions answered.       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, f/u yearly at [facility] 08-06-2022 stephanie thornton np epic   BV (bacterial vaginosis)    recurring   Chlamydia    Ectopic pregnancy 06/2011   Fibroid    Infertility    Secondary PCOS   PCOS (polycystic ovarian syndrome)    Shingles outbreak 11/2011    Past Surgical History:  Procedure Laterality Date   APPENDECTOMY  2022   DILATION AND CURETTAGE OF UTERUS  06/2011   ectopic   ear tube placement x 3     as child   EAR TUBE REMOVAL     as child   ECTOPIC PREGNANCY SURGERY  2013   LAPAROSCOPY  07/05/2011   Procedure: LAPAROSCOPY OPERATIVE;  Surgeon: Ugonna A Anyanwu, MD;  Location: [facility];  Service: Gynecology;  Laterality: N/A;   right ear surgery Right 2011   benign tumor removed   WISDOM TOOTH EXTRACTION      OB History  Gravida Para Term Preterm AB Living  6 3 3 0 3 3  SAB IAB Ectopic Multiple Live Births  2 0 1 0 3    # Outcome Date GA Lbr Len/2nd Weight Sex Delivery Anes PTL Lv  6 SAB 2023          5 Term 06/03/13    M    LIV     Birth Comments: System Generated. Please review and update pregnancy details.  4 Term 05/30/12 [redacted]w[redacted]d 17:21 / 00:11 6 lb 10 oz (3.005 kg) M Vag-Spont EPI  LIV  3 Ectopic 07/05/11             Birth Comments: D&C  2 Term 04/26/00     Vag-Spont   LIV  1 SAB             Social History   Socioeconomic History   Marital status: Single    Spouse name: Not on file   Number of children: Not on file   Years of education: Not on file    Highest education level: Not on file  Occupational History   Not on file  Tobacco Use   Smoking status: Never    Passive exposure: Never   Smokeless tobacco: Never  Vaping Use   Vaping Use: Never used  Substance and Sexual Activity   Alcohol use: No   Drug use: No   Sexual activity: Yes    Birth control/protection: None  Other Topics Concern   Not on file  Social History Narrative   As of 10/22/2013    Unemployed   Sexually active with her husband.   Lives with husband, 13 year old daughter, 1 year old son, and 2 month old son.   Occasional exercise.   Never smoker.   Never drug use or alcohol.   Does not feel at risk for STD and does feel safe in relationship.   Social Determinants of Health   Financial Resource Strain: Not on file  Food Insecurity: Food Insecurity Present (05/31/2022)   Hunger   Vital Sign    Worried About Running Out of Food in the Last Year: Often true    Ran Out of Food in the Last Year: Often true  Transportation Needs: No Transportation Needs (05/31/2022)   PRAPARE - Transportation    Lack of Transportation (Medical): No    Lack of Transportation (Non-Medical): No  Physical Activity: Not on file  Stress: Not on file  Social Connections: Not on file    Family History  Problem Relation Age of Onset   Diabetes Mother    Hypertension Mother    Cancer Father    Fibroids Sister    Hypertension Sister    Crohn's disease Brother    Cancer Brother    Diabetes Brother    Hypertension Brother    Kidney disease Brother    Breast cancer Maternal Aunt    Breast cancer Paternal Aunt    Anesthesia problems Neg Hx     (Not in a hospital admission)   Allergies  Allergen Reactions   Artichoke [Cynara Scolymus (Artichoke)] Anaphylaxis   Prednisone Rash    Review of Systems: Negative except for what is mentioned in HPI.     Physical Exam: BP 123/81   Pulse 66   Ht 5' 4" (1.626 m)   Wt 252 lb 8 oz (114.5 kg)   LMP 08/03/2022   BMI 43.34  kg/m  CONSTITUTIONAL: Well-developed, well-nourished female in no acute distress.  HENT:  Normocephalic, atraumatic, External right and left ear normal. Oropharynx is clear and moist EYES: Conjunctivae and EOM are normal. Pupils are equal, round, and reactive to light. No scleral icterus.  NECK: Normal range of motion, supple, no masses SKIN: Skin is warm and dry. No rash noted. Not diaphoretic. No erythema. No pallor. NEUROLGIC: Alert and oriented to person, place, and time. Normal reflexes, muscle tone coordination. No cranial nerve deficit noted. PSYCHIATRIC: Normal mood and affect. Normal behavior. Normal judgment and thought content. CARDIOVASCULAR: Normal heart rate noted, regular rhythm RESPIRATORY: Effort and breath sounds normal, no problems with respiration noted ABDOMEN: Soft, nontender, nondistended PELVIC: enlarged uterus noted.  Pap taken with chaperone present MUSCULOSKELETAL: Normal range of motion. No edema and no tenderness. 2+ distal pulses.   Pertinent Labs/Studies:   No results found for this or any previous visit (from the past 72 hour(s)). CLINICAL DATA:  Fibroid uterus, infertility, LMP 09/06/2021   EXAM: TRANSABDOMINAL AND TRANSVAGINAL ULTRASOUND OF PELVIS   TECHNIQUE: Both transabdominal and transvaginal ultrasound examinations of the pelvis were performed. Transabdominal technique was performed for global imaging of the pelvis including uterus, ovaries, adnexal regions, and pelvic cul-de-sac. It was necessary to proceed with endovaginal exam following the transabdominal exam to visualize the endometrium and ovaries.   COMPARISON:  10/06/2015   FINDINGS: Uterus   Measurements: 12.6 x 6.4 x 10.3 cm = volume: 433 mL. Enlarged uterus, anteverted, containing multiple masses consistent with leiomyomata. Measured lesions include a transmural leiomyoma RIGHT upper uterus 6.7 x 6.3 x 6.2 cm and lateral mid LEFT uterus 5.7 x 5.1 x 4.3 cm. Small subserosal  leiomyoma posterior 2.1 x 1.4 x 1.7 cm. Anterior wall intramural leiomyoma 2.7 x 2.4 x 2.4 cm.   Endometrium   Thickness: 15 mm.  No endometrial fluid or mass   Right ovary   Measurements: 2.9 x 1.5 x 2.1 cm = volume: 6.7 mL. Normal morphology without mass 5   Left ovary   Not visualized, likely obscured by bowel   Other findings   No free pelvic   fluid or adnexal masses.   IMPRESSION: Enlarged uterus containing multiple leiomyomata.   Nonvisualization of LEFT ovary.   Unremarkable endometrial complex and RIGHT ovary.      Assessment and Plan :Melissa [name] is a 46 y.o. G6P3033 here for preoperative visit for Sonata procedure.   Plan for Sonata fibroid ablation. NPO Admission labs ordered VS Q4 Preoperative vaginal cytotec prescribed.   Kalani Baray, M.D. Attending Obstetrician & Gynecologist, [facility], [facility]  

## 2022-08-29 NOTE — H&P (View-Only) (Signed)
OB/GYN Pre-Op History and Physical  Melissa [name] is a 47 y.o. [id number] presenting for preoperative visit.  Pt is scheduled for Sonata fibroid ablation.  Risks and benefits of the procedure given including bleeding, infection, involvement of other organs as well as uterine perforation.  Procedure discussed in detail and all questions answered.       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, f/u yearly at [facility] 08-06-2022 stephanie thornton np epic   BV (bacterial vaginosis)    recurring   Chlamydia    Ectopic pregnancy 06/2011   Fibroid    Infertility    Secondary PCOS   PCOS (polycystic ovarian syndrome)    Shingles outbreak 11/2011    Past Surgical History:  Procedure Laterality Date   APPENDECTOMY  2022   DILATION AND CURETTAGE OF UTERUS  06/2011   ectopic   ear tube placement x 3     as child   EAR TUBE REMOVAL     as child   ECTOPIC PREGNANCY SURGERY  2013   LAPAROSCOPY  07/05/2011   Procedure: LAPAROSCOPY OPERATIVE;  Surgeon: Tereso Newcomer, MD;  Location: [facility];  Service: Gynecology;  Laterality: N/A;   right ear surgery Right 2011   benign tumor removed   WISDOM TOOTH EXTRACTION      OB History  Gravida Para Term Preterm AB Living  6 3 3  0 3 3  SAB IAB Ectopic Multiple Live Births  2 0 1 0 3    # Outcome Date GA Lbr Len/2nd Weight Sex Delivery Anes PTL Lv  6 SAB 2023          5 Term 06/03/13    M    LIV     Birth Comments: System Generated. Please review and update pregnancy details.  4 Term 05/30/12 [redacted]w[redacted]d 17:21 / 00:11 6 lb 10 oz (3.005 kg) M Vag-Spont EPI  LIV  3 Ectopic 07/05/11             Birth Comments: D&C  2 Term 04/26/00     Vag-Spont   LIV  1 SAB             Social History   Socioeconomic History   Marital status: Single    Spouse name: Not on file   Number of children: Not on file   Years of education: Not on file    Highest education level: Not on file  Occupational History   Not on file  Tobacco Use   Smoking status: Never    Passive exposure: Never   Smokeless tobacco: Never  Vaping Use   Vaping Use: Never used  Substance and Sexual Activity   Alcohol use: No   Drug use: No   Sexual activity: Yes    Birth control/protection: None  Other Topics Concern   Not on file  Social History Narrative   As of 10/22/2013    Unemployed   Sexually active with her husband.   Lives with husband, 56 year old daughter, 76 year old son, and 33 month old son.   Occasional exercise.   Never smoker.   Never drug use or alcohol.   Does not feel at risk for STD and does feel safe in relationship.   Social Determinants of Health   Financial Resource Strain: Not on file  Food Insecurity: Food Insecurity Present (05/31/2022)   Hunger  Vital Sign    Worried About Programme researcher, broadcasting/film/video in the Last Year: Often true    Ran Out of Food in the Last Year: Often true  Transportation Needs: No Transportation Needs (05/31/2022)   PRAPARE - Administrator, Civil Service (Medical): No    Lack of Transportation (Non-Medical): No  Physical Activity: Not on file  Stress: Not on file  Social Connections: Not on file    Family History  Problem Relation Age of Onset   Diabetes Mother    Hypertension Mother    Cancer Father    Fibroids Sister    Hypertension Sister    Crohn's disease Brother    Cancer Brother    Diabetes Brother    Hypertension Brother    Kidney disease Brother    Breast cancer Maternal Aunt    Breast cancer Paternal Aunt    Anesthesia problems Neg Hx     (Not in a hospital admission)   Allergies  Allergen Reactions   Artichoke [Cynara Scolymus (Artichoke)] Anaphylaxis   Prednisone Rash    Review of Systems: Negative except for what is mentioned in HPI.     Physical Exam: BP 123/81   Pulse 66   Ht  (1.626 m)   Wt 252 lb 8 oz (114.5 kg)   LMP 08/03/2022   BMI 43.34  kg/m  CONSTITUTIONAL: Well-developed, well-nourished female in no acute distress.  HENT:  Normocephalic, atraumatic, External right and left ear normal. Oropharynx is clear and moist EYES: Conjunctivae and EOM are normal. Pupils are equal, round, and reactive to light. No scleral icterus.  NECK: Normal range of motion, supple, no masses SKIN: Skin is warm and dry. No rash noted. Not diaphoretic. No erythema. No pallor. NEUROLGIC: Alert and oriented to person, place, and time. Normal reflexes, muscle tone coordination. No cranial nerve deficit noted. PSYCHIATRIC: Normal mood and affect. Normal behavior. Normal judgment and thought content. CARDIOVASCULAR: Normal heart rate noted, regular rhythm RESPIRATORY: Effort and breath sounds normal, no problems with respiration noted ABDOMEN: Soft, nontender, nondistended PELVIC: enlarged uterus noted.  Pap taken with chaperone present MUSCULOSKELETAL: Normal range of motion. No edema and no tenderness. 2+ distal pulses.   Pertinent Labs/Studies:   No results found for this or any previous visit (from the past 72 hour(s)). CLINICAL DATA:  Fibroid uterus, infertility, LMP 09/06/2021   EXAM: TRANSABDOMINAL AND TRANSVAGINAL ULTRASOUND OF PELVIS   TECHNIQUE: Both transabdominal and transvaginal ultrasound examinations of the pelvis were performed. Transabdominal technique was performed for global imaging of the pelvis including uterus, ovaries, adnexal regions, and pelvic cul-de-sac. It was necessary to proceed with endovaginal exam following the transabdominal exam to visualize the endometrium and ovaries.   COMPARISON:  10/06/2015   FINDINGS: Uterus   Measurements: 12.6 x 6.4 x 10.3 cm = volume: 433 mL. Enlarged uterus, anteverted, containing multiple masses consistent with leiomyomata. Measured lesions include a transmural leiomyoma RIGHT upper uterus 6.7 x 6.3 x 6.2 cm and lateral mid LEFT uterus 5.7 x 5.1 x 4.3 cm. Small subserosal  leiomyoma posterior 2.1 x 1.4 x 1.7 cm. Anterior wall intramural leiomyoma 2.7 x 2.4 x 2.4 cm.   Endometrium   Thickness: 15 mm.  No endometrial fluid or mass   Right ovary   Measurements: 2.9 x 1.5 x 2.1 cm = volume: 6.7 mL. Normal morphology without mass 5   Left ovary   Not visualized, likely obscured by bowel   Other findings   No free pelvic  fluid or adnexal masses.   IMPRESSION: Enlarged uterus containing multiple leiomyomata.   Nonvisualization of LEFT ovary.   Unremarkable endometrial complex and RIGHT ovary.      Assessment and Plan :Melissa [name] is a 47 y.o. [id number] here for preoperative visit for Sonata procedure.   Plan for Sonata fibroid ablation. NPO Admission labs ordered VS Q4 Preoperative vaginal cytotec prescribed.   Mariel Aloe, M.D. Attending Obstetrician & Gynecologist, [facility] for [employer], [facility]

## 2022-09-02 ENCOUNTER — Encounter: Payer: Self-pay | Admitting: Obstetrics and Gynecology

## 2022-09-02 ENCOUNTER — Other Ambulatory Visit: Payer: Self-pay | Admitting: Obstetrics and Gynecology

## 2022-09-02 DIAGNOSIS — Z01818 Encounter for other preprocedural examination: Secondary | ICD-10-CM

## 2022-09-03 ENCOUNTER — Telehealth: Payer: Self-pay | Admitting: Family Medicine

## 2022-09-03 DIAGNOSIS — Z01818 Encounter for other preprocedural examination: Secondary | ICD-10-CM

## 2022-09-03 MED ORDER — MISOPROSTOL 100 MCG PO TABS
ORAL_TABLET | ORAL | 0 refills | Status: DC
Start: 2022-09-03 — End: 2022-09-04

## 2022-09-03 NOTE — Telephone Encounter (Signed)
Patient's pharmacy has not received her medication for her surgery tomorrow

## 2022-09-03 NOTE — Telephone Encounter (Signed)
Med reordered to pharmacy. Patient informed via front office staff.

## 2022-09-04 ENCOUNTER — Ambulatory Visit (HOSPITAL_COMMUNITY)
Admission: RE | Admit: 2022-09-04 | Discharge: 2022-09-04 | Disposition: A | Discharge status: Home / Self Care | Payer: Medicaid Other | Source: Ambulatory Visit | Attending: Obstetrics and Gynecology | Admitting: Obstetrics and Gynecology

## 2022-09-04 ENCOUNTER — Other Ambulatory Visit: Payer: Self-pay

## 2022-09-04 ENCOUNTER — Encounter (HOSPITAL_BASED_OUTPATIENT_CLINIC_OR_DEPARTMENT_OTHER): Payer: Self-pay | Admitting: Obstetrics and Gynecology

## 2022-09-04 ENCOUNTER — Ambulatory Visit (HOSPITAL_BASED_OUTPATIENT_CLINIC_OR_DEPARTMENT_OTHER): Payer: Medicaid Other | Admitting: Anesthesiology

## 2022-09-04 ENCOUNTER — Encounter (HOSPITAL_BASED_OUTPATIENT_CLINIC_OR_DEPARTMENT_OTHER)
Admission: RE | Disposition: A | Discharge status: Home / Self Care | Payer: Self-pay | Source: Ambulatory Visit | Attending: Obstetrics and Gynecology

## 2022-09-04 DIAGNOSIS — Z86018 Personal history of other benign neoplasm: Secondary | ICD-10-CM | POA: Insufficient documentation

## 2022-09-04 DIAGNOSIS — N939 Abnormal uterine and vaginal bleeding, unspecified: Secondary | ICD-10-CM

## 2022-09-04 DIAGNOSIS — D259 Leiomyoma of uterus, unspecified: Secondary | ICD-10-CM | POA: Diagnosis not present

## 2022-09-04 DIAGNOSIS — D250 Submucous leiomyoma of uterus: Secondary | ICD-10-CM

## 2022-09-04 DIAGNOSIS — D219 Benign neoplasm of connective and other soft tissue, unspecified: Secondary | ICD-10-CM

## 2022-09-04 DIAGNOSIS — D251 Intramural leiomyoma of uterus: Secondary | ICD-10-CM

## 2022-09-04 DIAGNOSIS — Z6841 Body Mass Index (BMI) 40.0 and over, adult: Secondary | ICD-10-CM | POA: Insufficient documentation

## 2022-09-04 DIAGNOSIS — Z01818 Encounter for other preprocedural examination: Secondary | ICD-10-CM

## 2022-09-04 DIAGNOSIS — D252 Subserosal leiomyoma of uterus: Secondary | ICD-10-CM | POA: Diagnosis not present

## 2022-09-04 DIAGNOSIS — N938 Other specified abnormal uterine and vaginal bleeding: Secondary | ICD-10-CM

## 2022-09-04 HISTORY — DX: Abnormal uterine and vaginal bleeding, unspecified: N93.9

## 2022-09-04 HISTORY — DX: Neoplasm of uncertain behavior of appendix: D37.3

## 2022-09-04 LAB — CBC
HCT: 40.9 % (ref 36.0–46.0)
Hemoglobin: 12.9 g/dL (ref 12.0–15.0)
MCH: 27.5 pg (ref 26.0–34.0)
MCHC: 31.5 g/dL (ref 30.0–36.0)
MCV: 87.2 fL (ref 80.0–100.0)
Platelets: 341 10*3/uL (ref 150–400)
RBC: 4.69 MIL/uL (ref 3.87–5.11)
RDW: 15.6 % — ABNORMAL HIGH (ref 11.5–15.5)
WBC: 4.1 10*3/uL (ref 4.0–10.5)
nRBC: 0 % (ref 0.0–0.2)

## 2022-09-04 LAB — POCT PREGNANCY, URINE: Preg Test, Ur: NEGATIVE

## 2022-09-04 LAB — CYTOLOGY - PAP
Chlamydia: NEGATIVE
Comment: NEGATIVE
Comment: NEGATIVE
Comment: NORMAL
Diagnosis: NEGATIVE
High risk HPV: NEGATIVE
Neisseria Gonorrhea: NEGATIVE

## 2022-09-04 LAB — TYPE AND SCREEN
ABO/RH(D): O POS
Antibody Screen: NEGATIVE

## 2022-09-04 SURGERY — RADIOFREQUENCY ABLATION, LEIOMYOMA, UTERUS, TRANSCERVICAL APPROACH, WITH US GUIDANCE
Anesthesia: General | Site: Uterus

## 2022-09-04 MED ORDER — DEXAMETHASONE SODIUM PHOSPHATE 10 MG/ML IJ SOLN
INTRAMUSCULAR | Status: AC
  Filled 2022-09-04: qty 1

## 2022-09-04 MED ORDER — PROPOFOL 10 MG/ML IV BOLUS
INTRAVENOUS | Status: AC
  Filled 2022-09-04: qty 20

## 2022-09-04 MED ORDER — CEFAZOLIN SODIUM-DEXTROSE 2-4 GM/100ML-% IV SOLN
INTRAVENOUS | Status: AC
  Filled 2022-09-04: qty 100

## 2022-09-04 MED ORDER — FENTANYL CITRATE (PF) 100 MCG/2ML IJ SOLN: 25.0000 ug | INTRAMUSCULAR | Status: DC | PRN

## 2022-09-04 MED ORDER — MIDAZOLAM HCL 5 MG/5ML IJ SOLN
INTRAMUSCULAR | Status: DC | PRN
  Administered 2022-09-04: 2 mg via INTRAVENOUS

## 2022-09-04 MED ORDER — ACETAMINOPHEN 500 MG PO TABS
1000.0000 mg | ORAL_TABLET | Freq: Once | ORAL | Status: AC
  Administered 2022-09-04: 1000 mg via ORAL

## 2022-09-04 MED ORDER — LACTATED RINGERS IV SOLN: INTRAVENOUS | Status: DC

## 2022-09-04 MED ORDER — LIDOCAINE HCL (PF) 2 % IJ SOLN
INTRAMUSCULAR | Status: AC
  Filled 2022-09-04: qty 10

## 2022-09-04 MED ORDER — MIDAZOLAM HCL 2 MG/2ML IJ SOLN
INTRAMUSCULAR | Status: AC
  Filled 2022-09-04: qty 2

## 2022-09-04 MED ORDER — SCOPOLAMINE 1 MG/3DAYS TD PT72
1.0000 | MEDICATED_PATCH | TRANSDERMAL | Status: DC
  Administered 2022-09-04: 1.5 mg via TRANSDERMAL

## 2022-09-04 MED ORDER — LIDOCAINE HCL 1 % IJ SOLN
INTRAMUSCULAR | Status: DC | PRN
  Administered 2022-09-04: 10 mL

## 2022-09-04 MED ORDER — STERILE WATER FOR IRRIGATION IR SOLN
Status: DC | PRN
  Administered 2022-09-04: 500 mL

## 2022-09-04 MED ORDER — PROPOFOL 10 MG/ML IV BOLUS
INTRAVENOUS | Status: DC | PRN
  Administered 2022-09-04: 170 mg via INTRAVENOUS
  Administered 2022-09-04: 30 mg via INTRAVENOUS

## 2022-09-04 MED ORDER — SCOPOLAMINE 1 MG/3DAYS TD PT72
MEDICATED_PATCH | TRANSDERMAL | Status: AC
  Filled 2022-09-04: qty 1

## 2022-09-04 MED ORDER — SOD CITRATE-CITRIC ACID 500-334 MG/5ML PO SOLN: 30.0000 mL | ORAL | Status: DC

## 2022-09-04 MED ORDER — ACETAMINOPHEN 500 MG PO TABS
ORAL_TABLET | ORAL | Status: AC
  Filled 2022-09-04: qty 2

## 2022-09-04 MED ORDER — DEXAMETHASONE SODIUM PHOSPHATE 10 MG/ML IJ SOLN
INTRAMUSCULAR | Status: DC | PRN
  Administered 2022-09-04: 5 mg via INTRAVENOUS

## 2022-09-04 MED ORDER — OXYCODONE HCL 5 MG PO TABS
5.0000 mg | ORAL_TABLET | Freq: Once | ORAL | Status: AC
  Administered 2022-09-04: 5 mg via ORAL

## 2022-09-04 MED ORDER — GLYCOPYRROLATE PF 0.2 MG/ML IJ SOSY
PREFILLED_SYRINGE | INTRAMUSCULAR | Status: DC | PRN
  Administered 2022-09-04: .2 mg via INTRAVENOUS

## 2022-09-04 MED ORDER — ACETAMINOPHEN 500 MG PO TABS: 1000.0000 mg | ORAL_TABLET | ORAL | Status: DC

## 2022-09-04 MED ORDER — PROMETHAZINE HCL 25 MG/ML IJ SOLN: 6.2500 mg | INTRAMUSCULAR | Status: DC | PRN

## 2022-09-04 MED ORDER — POVIDONE-IODINE 10 % EX SWAB: 2.0000 | Freq: Once | CUTANEOUS | Status: DC

## 2022-09-04 MED ORDER — CEFAZOLIN SODIUM-DEXTROSE 2-4 GM/100ML-% IV SOLN
2.0000 g | INTRAVENOUS | Status: AC
  Administered 2022-09-04: 2 g via INTRAVENOUS

## 2022-09-04 MED ORDER — AMISULPRIDE (ANTIEMETIC) 5 MG/2ML IV SOLN: 10.0000 mg | Freq: Once | INTRAVENOUS | Status: DC | PRN

## 2022-09-04 MED ORDER — GLYCOPYRROLATE PF 0.2 MG/ML IJ SOSY
PREFILLED_SYRINGE | INTRAMUSCULAR | Status: AC
  Filled 2022-09-04: qty 1

## 2022-09-04 MED ORDER — LIDOCAINE 2% (20 MG/ML) 5 ML SYRINGE
INTRAMUSCULAR | Status: DC | PRN
  Administered 2022-09-04: 100 mg via INTRAVENOUS

## 2022-09-04 MED ORDER — KETOROLAC TROMETHAMINE 30 MG/ML IJ SOLN
INTRAMUSCULAR | Status: AC
  Filled 2022-09-04: qty 1

## 2022-09-04 MED ORDER — OXYCODONE HCL 5 MG PO TABS
ORAL_TABLET | ORAL | Status: AC
  Filled 2022-09-04: qty 1

## 2022-09-04 MED ORDER — ONDANSETRON HCL 4 MG/2ML IJ SOLN
INTRAMUSCULAR | Status: AC
  Filled 2022-09-04: qty 2

## 2022-09-04 MED ORDER — FENTANYL CITRATE (PF) 100 MCG/2ML IJ SOLN
INTRAMUSCULAR | Status: AC
  Filled 2022-09-04: qty 2

## 2022-09-04 MED ORDER — DIPHENHYDRAMINE HCL 50 MG/ML IJ SOLN
INTRAMUSCULAR | Status: DC | PRN
  Administered 2022-09-04: 12.5 mg via INTRAVENOUS

## 2022-09-04 MED ORDER — FENTANYL CITRATE (PF) 100 MCG/2ML IJ SOLN
INTRAMUSCULAR | Status: DC | PRN
  Administered 2022-09-04: 100 ug via INTRAVENOUS

## 2022-09-04 MED ORDER — IBUPROFEN 600 MG PO TABS: 600.0000 mg | ORAL_TABLET | Freq: Four times a day (QID) | ORAL | 2 refills | Status: AC | PRN

## 2022-09-04 SURGICAL SUPPLY — 19 items
CATH ROBINSON RED A/P 14FR (CATHETERS) IMPLANT
CATH ROBINSON RED A/P 16FR (CATHETERS) ×1 IMPLANT
DEVICE MYOSURE LITE (MISCELLANEOUS) IMPLANT
DEVICE MYOSURE REACH (MISCELLANEOUS) IMPLANT
ELECT DISPERSIVE SONATA (ELECTRODE) ×2 IMPLANT
GAUZE 4X4 16PLY ~~LOC~~+RFID DBL (SPONGE) IMPLANT
GLOVE BIO SURGEON STRL SZ8 (GLOVE) ×1 IMPLANT
GOWN STRL REUS W/TWL LRG LVL3 (GOWN DISPOSABLE) ×1 IMPLANT
HANDPIECE RFA SONATA (MISCELLANEOUS) ×1 IMPLANT
KIT PROCEDURE FLUENT (KITS) IMPLANT
KIT TURNOVER CYSTO (KITS) ×1 IMPLANT
MYOSURE XL FIBROID (MISCELLANEOUS)
PACK VAGINAL MINOR WOMEN LF (CUSTOM PROCEDURE TRAY) ×1 IMPLANT
PAD OB MATERNITY 4.3X12.25 (PERSONAL CARE ITEMS) ×1 IMPLANT
PAD PREP 24X48 CUFFED NSTRL (MISCELLANEOUS) ×1 IMPLANT
SEAL ROD LENS SCOPE MYOSURE (ABLATOR) IMPLANT
SLEEVE SCD COMPRESS KNEE MED (STOCKING) ×2 IMPLANT
SYSTEM TISS REMOVAL MYOSURE XL (MISCELLANEOUS) IMPLANT
TOWEL OR 17X24 6PK STRL BLUE (TOWEL DISPOSABLE) ×1 IMPLANT

## 2022-09-04 NOTE — Anesthesia Postprocedure Evaluation (Signed)
Anesthesia Post Note  Patient: Melissa [name]  Procedure(s) Performed: Radio Frequency Ablation with Sonata (Uterus)     Patient location during evaluation: PACU Anesthesia Type: General Level of consciousness: awake and alert Pain management: pain level controlled Vital Signs Assessment: post-procedure vital signs reviewed and stable Respiratory status: spontaneous breathing, nonlabored ventilation, respiratory function stable and patient connected to nasal cannula oxygen Cardiovascular status: blood pressure returned to baseline and stable Postop Assessment: no apparent nausea or vomiting Anesthetic complications: no  No notable events documented.  Last Vitals:  Vitals:   09/04/22 1615 09/04/22 1640  BP: (!) 167/91 (!) 173/90  Pulse: (!) 56 (!) 48  Resp: 20 16  Temp:  36.5 C  SpO2: 99% 100%    Last Pain:  Vitals:   09/04/22 1640  TempSrc:   PainSc: 4                  Ellianna Ruest S

## 2022-09-04 NOTE — Anesthesia Procedure Notes (Signed)
Procedure Name: LMA Insertion Date/Time: 09/04/2022 2:45 PM  Performed by: Bishop Limbo, CRNAPre-anesthesia Checklist: Patient identified, Emergency Drugs available, Suction available and Patient being monitored Patient Re-evaluated:Patient Re-evaluated prior to induction Oxygen Delivery Method: Circle System Utilized Preoxygenation: Pre-oxygenation with 100% oxygen Induction Type: IV induction Ventilation: Mask ventilation without difficulty LMA: LMA inserted LMA Size: 4.0 Number of attempts: 1 Placement Confirmation: positive ETCO2 Tube secured with: Tape Dental Injury: Teeth and Oropharynx as per pre-operative assessment

## 2022-09-04 NOTE — Anesthesia Preprocedure Evaluation (Addendum)
Anesthesia Evaluation  Patient identified by MRN, date of birth, ID band Patient awake    Reviewed: Allergy & Precautions, NPO status , Patient's Chart, lab work & pertinent test results  Airway Mallampati: II  TM Distance: >3 FB Neck ROM: Full    Dental  (+) Dental Advisory Given   Pulmonary neg pulmonary ROS   Pulmonary exam normal        Cardiovascular negative cardio ROS Normal cardiovascular exam     Neuro/Psych negative neurological ROS     GI/Hepatic negative GI ROS, Neg liver ROS,,,low grade appendiceal mucinous neoplasm removed 2022   Endo/Other    Morbid obesity  Renal/GU negative Renal ROS     Musculoskeletal negative musculoskeletal ROS (+)    Abdominal   Peds  Hematology negative hematology ROS (+)   Anesthesia Other Findings   Reproductive/Obstetrics PCOS                             Anesthesia Physical Anesthesia Plan  ASA: 3  Anesthesia Plan: General   Post-op Pain Management: Tylenol PO (pre-op)* and Toradol IV (intra-op)*   Induction: Intravenous  PONV Risk Score and Plan: 4 or greater and Ondansetron, Dexamethasone, Midazolam and Scopolamine patch - Pre-op  Airway Management Planned: LMA  Additional Equipment:   Intra-op Plan:   Post-operative Plan: Extubation in OR  Informed Consent: I have reviewed the patients History and Physical, chart, labs and discussed the procedure including the risks, benefits and alternatives for the proposed anesthesia with the patient or authorized representative who has indicated his/her understanding and acceptance.     Dental advisory given  Plan Discussed with: Anesthesiologist and CRNA  Anesthesia Plan Comments:        Anesthesia Quick Evaluation

## 2022-09-04 NOTE — Op Note (Signed)
PREOPERATIVE DIAGNOSIS: Symptomatic uterine fibroids POSTOPERATIVE DIAGNOSIS: The same PROCEDURE: Transcervical Sonata uterine fibroid radiofrequency ablation. SURGEON:  Dr. Mariel Aloe   INDICATIONS: 47 y.o. [id number]  here for scheduled surgery for the aforementioned diagnoses.   Risks of surgery were discussed with the patient including but not limited to: bleeding which may require transfusion; infection which may require antibiotics; injury to uterus or surrounding organs; intrauterine scarring which may impair future fertility; need for additional procedures including laparotomy or laparoscopy; and other postoperative/anesthesia complications. Written informed consent was obtained.    FINDINGS:  A 12 week size uterus.   Fibroid A: 4 cm Fibroid  B: 1.5 cm Fibroid C: 5.5 cm ANESTHESIA:   General, paracervical block with 30 ml of 0.5% Marcaine INTRAVENOUS FLUIDS:  700 ml of LR ESTIMATED BLOOD LOSS:  Less than 10 ml UOP: 100 ml clear urine SPECIMENS: none COMPLICATIONS:  None immediate.  PROCEDURE DETAILS:  After administering anesthesia, the patient was identified and a time out was performed and procedure was confirmed.  The patient was placed in dorsal lithotomy position and an exam under anesthesia revealed a mobile fibroid uterus and normal adnexa.  A speculum was placed, cervix was grasped with a tenaculum and sounded to a depth of 8 centimeters.  The cervix was easily dilated to a #27 Pratt dilator.   The Sonata handpiece was then inserted and a complete uterine survey was performed with the ultrasound.  Sonata fibroid ablation was then performed.  Fibroid 1: ablation 1: 3.2 x 2.3 , 3 minutes 18 seconds                 Ablation 2: 3.1 x 2.3, 3 min 12 seconds  Fibroid 2:  ablation: 2.o x 1.3 cm, one minute  Fibroid 3: ablation: 4.2 x 3.4, 5 minutes 30 seconds, lower uterine segment close to bladder , unable to ablate any lower. All cycles were carried out under direct ultrasound  intrauterine guidance and visualization with the ablation guide noted to be within the serosa at all times.  The fibroids appeared ablated with u/s guidance and outgassing noted by U/S appearance. (Hysteroscopy was performed which demonstrated complete ablation on the submucosal endometrial surface where the fibroids were located.  Sponge, needle, and instrument account was correct x 2.  All instruments were removed from the vagina.  Hemostasis was assured.  The patient was then awakened and taken to the recovery room I stable condition, tolerating the procedure well.  The patient will be discharged to home as per PACU criteria.  Routine postoperative instructions given.  She was prescribed Ibuprofen.  She will follow up in the clinic in 2-3 weeks  for postoperative evaluation.   Mariel Aloe, MD, FACOG Obstetrician & Gynecologist, [facility] for [facility], [facility]

## 2022-09-04 NOTE — Transfer of Care (Signed)
Immediate Anesthesia Transfer of Care Note  Patient: Melissa [name]  Procedure(s) Performed: Radio Frequency Ablation with Sonata (Uterus)  Patient Location: PACU  Anesthesia Type:General  Level of Consciousness: drowsy, patient cooperative, and responds to stimulation  Airway & Oxygen Therapy: Patient Spontanous Breathing  Post-op Assessment: Report given to RN and Post -op Vital signs reviewed and stable  Post vital signs: Reviewed and stable  Last Vitals:  Vitals Value Taken Time  BP 169/91 09/04/22 1600  Temp    Pulse 88 09/04/22 1602  Resp 28 09/04/22 1602  SpO2 98 % 09/04/22 1602  Vitals shown include unvalidated device data.  Last Pain:  Vitals:   09/04/22 1235  TempSrc: Oral         Complications: No notable events documented.

## 2022-09-04 NOTE — Discharge Instructions (Signed)
  Post Anesthesia Home Care Instructions  Activity: Get plenty of rest for the remainder of the day. A responsible individual must stay with you for 24 hours following the procedure.  For the next 24 hours, DO NOT: -Drive a car -Advertising copywriter -Drink alcoholic beverages -Take any medication unless instructed by your physician -Make any legal decisions or sign important papers.  Meals: Start with liquid foods such as gelatin or soup. Progress to regular foods as tolerated. Avoid greasy, spicy, heavy foods. If nausea and/or vomiting occur, drink only clear liquids until the nausea and/or vomiting subsides. Call your physician if vomiting continues.  Special Instructions/Symptoms: Your throat may feel dry or sore from the anesthesia or the breathing tube placed in your throat during surgery. If this causes discomfort, gargle with warm salt water. The discomfort should disappear within 24 hours.  If you had a scopolamine patch placed behind your ear for the management of post- operative nausea and/or vomiting:  1. The medication in the patch is effective for 72 hours, after which it should be removed.  Wrap patch in a tissue and discard in the trash. Wash hands thoroughly with soap and water. 2. You may remove the patch earlier than 72 hours if you experience unpleasant side effects which may include dry mouth, dizziness or visual disturbances. 3. Avoid touching the patch. Wash your hands with soap and water after contact with the patch.   You were given Tylenol before your surgical procedure today (1:34pm), please do not take any more tylenol until after 7pm tonight You were also given Toradol (NSAID) at 3:30pm, You may take Ibuprofen after 9:30pm tonight

## 2022-09-04 NOTE — Interval H&P Note (Signed)
History and Physical Interval Note:  09/04/2022 2:28 PM  Melissa [name]  has presented today for surgery, with the diagnosis of AUB Fibroids.  The various methods of treatment have been discussed with the patient and family. After consideration of risks, benefits and other options for treatment, the patient has consented to  Procedure(s): Radio Frequency Ablation with Sonata (N/A) as a surgical intervention.  The patient's history has been reviewed, patient examined, no change in status, stable for surgery.  I have reviewed the patient's chart and labs.  Questions were answered to the patient's satisfaction.     Warden Fillers

## 2022-09-06 ENCOUNTER — Encounter: Payer: Self-pay | Admitting: Obstetrics and Gynecology

## 2022-09-07 ENCOUNTER — Telehealth: Payer: Self-pay | Admitting: General Practice

## 2022-09-07 DIAGNOSIS — R112 Nausea with vomiting, unspecified: Secondary | ICD-10-CM

## 2022-09-07 MED ORDER — PROMETHAZINE HCL 25 MG PO TABS
25.0000 mg | ORAL_TABLET | Freq: Four times a day (QID) | ORAL | 0 refills | Status: AC | PRN
Start: 2022-09-07 — End: ?

## 2022-09-07 NOTE — Telephone Encounter (Signed)
Called patient regarding mychart message. She reports feeling nauseous since surgery. She hasn't ate much as a result but has been drinking liquids. Patient reports vomiting x 1 yesterday. Patient also asked about alternative pain medicine as ibuprofen makes her constipated. Told patient that any other prescription we would sent in would likely cause the same. Patient also asked about naproxen. Told her she could do that and pick up that up from the pharmacy without a prescription, the only issue with that is it is only once a day whereas the ibuprofen is multiple doses throughout the day. Recommended she also try extra strength tylenol. Also suggested miralax for constipation and BRAT diet for nausea. Promethazine Rx sent to pharmacy and patient informed. Patient verbalized understanding.

## 2022-09-12 ENCOUNTER — Emergency Department (HOSPITAL_BASED_OUTPATIENT_CLINIC_OR_DEPARTMENT_OTHER): Payer: Medicaid Other

## 2022-09-12 ENCOUNTER — Encounter (HOSPITAL_BASED_OUTPATIENT_CLINIC_OR_DEPARTMENT_OTHER): Payer: Self-pay

## 2022-09-12 ENCOUNTER — Emergency Department (HOSPITAL_BASED_OUTPATIENT_CLINIC_OR_DEPARTMENT_OTHER)
Admission: EM | Admit: 2022-09-12 | Discharge: 2022-09-12 | Disposition: A | Discharge status: Home / Self Care | Payer: Medicaid Other | Attending: Emergency Medicine | Admitting: Emergency Medicine

## 2022-09-12 ENCOUNTER — Other Ambulatory Visit: Payer: Self-pay

## 2022-09-12 DIAGNOSIS — R1011 Right upper quadrant pain: Secondary | ICD-10-CM | POA: Insufficient documentation

## 2022-09-12 DIAGNOSIS — R109 Unspecified abdominal pain: Secondary | ICD-10-CM | POA: Diagnosis not present

## 2022-09-12 DIAGNOSIS — D259 Leiomyoma of uterus, unspecified: Secondary | ICD-10-CM | POA: Insufficient documentation

## 2022-09-12 LAB — LIPASE, BLOOD: Lipase: 28 U/L (ref 11–51)

## 2022-09-12 LAB — CBC WITH DIFFERENTIAL/PLATELET
Abs Immature Granulocytes: 0.01 10*3/uL (ref 0.00–0.07)
Basophils Absolute: 0 10*3/uL (ref 0.0–0.1)
Basophils Relative: 1 %
Eosinophils Absolute: 0.1 10*3/uL (ref 0.0–0.5)
Eosinophils Relative: 2 %
HCT: 36.7 % (ref 36.0–46.0)
Hemoglobin: 11.8 g/dL — ABNORMAL LOW (ref 12.0–15.0)
Immature Granulocytes: 0 %
Lymphocytes Relative: 28 %
Lymphs Abs: 1.4 10*3/uL (ref 0.7–4.0)
MCH: 27.4 pg (ref 26.0–34.0)
MCHC: 32.2 g/dL (ref 30.0–36.0)
MCV: 85.2 fL (ref 80.0–100.0)
Monocytes Absolute: 0.5 10*3/uL (ref 0.1–1.0)
Monocytes Relative: 11 %
Neutro Abs: 2.9 10*3/uL (ref 1.7–7.7)
Neutrophils Relative %: 58 %
Platelets: 347 10*3/uL (ref 150–400)
RBC: 4.31 MIL/uL (ref 3.87–5.11)
RDW: 14.8 % (ref 11.5–15.5)
WBC: 5 10*3/uL (ref 4.0–10.5)
nRBC: 0 % (ref 0.0–0.2)

## 2022-09-12 LAB — COMPREHENSIVE METABOLIC PANEL
ALT: 12 U/L (ref 0–44)
AST: 15 U/L (ref 15–41)
Albumin: 3.3 g/dL — ABNORMAL LOW (ref 3.5–5.0)
Alkaline Phosphatase: 54 U/L (ref 38–126)
Anion gap: 6 (ref 5–15)
BUN: 14 mg/dL (ref 6–20)
CO2: 27 mmol/L (ref 22–32)
Calcium: 8.8 mg/dL — ABNORMAL LOW (ref 8.9–10.3)
Chloride: 103 mmol/L (ref 98–111)
Creatinine, Ser: 0.87 mg/dL (ref 0.44–1.00)
GFR, Estimated: >60 mL/min (ref >60)
Glucose, Bld: 102 mg/dL — ABNORMAL HIGH (ref 70–99)
Potassium: 4.1 mmol/L (ref 3.5–5.1)
Sodium: 136 mmol/L (ref 135–145)
Total Bilirubin: 0.5 mg/dL (ref 0.3–1.2)
Total Protein: 7.5 g/dL (ref 6.5–8.1)

## 2022-09-12 LAB — LACTIC ACID, PLASMA: Lactic Acid, Venous: 0.9 mmol/L (ref 0.5–1.9)

## 2022-09-12 MED ORDER — PANTOPRAZOLE SODIUM 40 MG PO TBEC: 40.0000 mg | DELAYED_RELEASE_TABLET | Freq: Every day | ORAL | 0 refills | Status: AC

## 2022-09-12 MED ORDER — DICYCLOMINE HCL 20 MG PO TABS: 20.0000 mg | ORAL_TABLET | Freq: Two times a day (BID) | ORAL | 0 refills | Status: DC

## 2022-09-12 MED ORDER — LACTATED RINGERS IV BOLUS
1000.0000 mL | Freq: Once | INTRAVENOUS | Status: AC
  Administered 2022-09-12: 1000 mL via INTRAVENOUS

## 2022-09-12 MED ORDER — ALUM & MAG HYDROXIDE-SIMETH 200-200-20 MG/5ML PO SUSP
30.0000 mL | Freq: Once | ORAL | Status: AC
  Administered 2022-09-12: 30 mL via ORAL
  Filled 2022-09-12: qty 30

## 2022-09-12 MED ORDER — ONDANSETRON HCL 4 MG/2ML IJ SOLN
4.0000 mg | Freq: Three times a day (TID) | INTRAMUSCULAR | Status: DC | PRN
  Administered 2022-09-12: 4 mg via INTRAVENOUS
  Filled 2022-09-12: qty 2

## 2022-09-12 MED ORDER — FENTANYL CITRATE PF 50 MCG/ML IJ SOSY
50.0000 ug | PREFILLED_SYRINGE | Freq: Once | INTRAMUSCULAR | Status: AC
  Administered 2022-09-12: 50 ug via INTRAVENOUS
  Filled 2022-09-12: qty 1

## 2022-09-12 MED ORDER — IOHEXOL 300 MG/ML  SOLN
100.0000 mL | Freq: Once | INTRAMUSCULAR | Status: AC | PRN
  Administered 2022-09-12: 100 mL via INTRAVENOUS

## 2022-09-12 MED ORDER — DICYCLOMINE HCL 10 MG PO CAPS
10.0000 mg | ORAL_CAPSULE | Freq: Once | ORAL | Status: AC
  Administered 2022-09-12: 10 mg via ORAL
  Filled 2022-09-12: qty 1

## 2022-09-12 MED ORDER — LIDOCAINE VISCOUS HCL 2 % MT SOLN
15.0000 mL | Freq: Once | OROMUCOSAL | Status: AC
  Administered 2022-09-12: 15 mL via ORAL
  Filled 2022-09-12: qty 15

## 2022-09-12 MED ORDER — FAMOTIDINE 20 MG PO TABS: 20.0000 mg | ORAL_TABLET | Freq: Two times a day (BID) | ORAL | 0 refills | Status: DC

## 2022-09-12 NOTE — ED Notes (Signed)
Pt refusing pregnancy test. States "I had a test last month and it was negative. I have not had sex". CT and MD aware.

## 2022-09-12 NOTE — ED Provider Notes (Signed)
[facility] Provider Note   CSN: [id number] Arrival date & time: 09/12/22  1027     History  Chief Complaint  Patient presents with   Abdominal Pain    Melissa [name] is a 47 y.o. female.   Abdominal Pain Associated symptoms: nausea      47 year old female with medical history significant for uterine fibroids, PCOS, status post uterine ablation for fibroids on 09/04/2022 who presents to the emergency department with abdominal pain.  The patient states that she is been having right-sided abdominal pain, right upper quadrant pain over the past few days has been worsening.  She endorses some crampy lower abdominal discomfort, denies any vaginal bleeding or vaginal discharge.  No fevers or chills.  No urinary symptoms.  She endorses mild nausea, denies any vomiting.  She denies any history of gallstone disease.  Home Medications Prior to Admission medications   Medication Sig Start Date End Date Taking? Authorizing Provider  dicyclomine (BENTYL) 20 MG tablet Take 1 tablet (20 mg total) by mouth 2 (two) times daily. 09/12/22  Yes Ernie Avena, MD  famotidine (PEPCID) 20 MG tablet Take 1 tablet (20 mg total) by mouth 2 (two) times daily. 09/12/22  Yes Ernie Avena, MD  pantoprazole (PROTONIX) 40 MG tablet Take 1 tablet (40 mg total) by mouth daily. 09/12/22 10/12/22 Yes Ernie Avena, MD  ibuprofen (ADVIL) 600 MG tablet Take 1 tablet (600 mg total) by mouth every 6 (six) hours as needed for moderate pain or cramping. 09/04/22   Warden Fillers, MD  Lidocaine HCl 2 % CREA Apply 1 Application topically 2 (two) times daily. 03/06/22   Cristopher Peru, PA-C  loratadine-pseudoephedrine (CLARITIN-D 12-HOUR) 5-120 MG tablet Take 1 tablet by mouth 2 (two) times daily.    [provider]  OVER THE COUNTER MEDICATION Vital protein collagen gummies 4 per day    [provider]  promethazine (PHENERGAN) 25 MG tablet Take 1 tablet (25 mg  total) by mouth every 6 (six) hours as needed for nausea or vomiting. 09/07/22   Marny Lowenstein, PA-C  diphenhydrAMINE (BENADRYL) 12.5 MG/5ML liquid Take 12.5 mg by mouth once.  08/01/11  [provider]      Allergies    [place] scolymus (artichoke)] and Prednisone    Review of Systems   Review of Systems  Gastrointestinal:  Positive for abdominal pain and nausea.  All other systems reviewed and are negative.   Physical Exam Updated Vital Signs BP 115/60   Pulse (!) 58   Temp 98.4 F (36.9 C) (Oral)   Resp 18   Ht 5\' 4"  (1.626 m)   Wt 113.1 kg   LMP 08/03/2022 Comment: negative u-preg on 09/04/22, has not had sex since then, per Patient and per PA  SpO2 100%   BMI 42.79 kg/m  Physical Exam Vitals and nursing note reviewed.  Constitutional:      General: She is not in acute distress.    Appearance: She is well-developed.  HENT:     Head: Normocephalic and atraumatic.  Eyes:     Conjunctiva/sclera: Conjunctivae normal.  Cardiovascular:     Rate and Rhythm: Normal rate and regular rhythm.     Heart sounds: No murmur heard. Pulmonary:     Effort: Pulmonary effort is normal. No respiratory distress.     Breath sounds: Normal breath sounds.  Abdominal:     Palpations: Abdomen is soft.     Tenderness: There is abdominal  tenderness in the right upper quadrant and suprapubic area. There is no guarding. Negative signs include Murphy's sign.  Musculoskeletal:        General: No swelling.     Cervical back: Neck supple.  Skin:    General: Skin is warm and dry.     Capillary Refill: Capillary refill takes less than 2 seconds.  Neurological:     Mental Status: She is alert.  Psychiatric:        Mood and Affect: Mood normal.     ED Results / Procedures / Treatments   Labs (all labs ordered are listed, but only abnormal results are displayed) Labs Reviewed  CBC WITH DIFFERENTIAL/PLATELET - Abnormal; Notable for the following components:      Result  Value   Hemoglobin 11.8 (*)    All other components within normal limits  COMPREHENSIVE METABOLIC PANEL - Abnormal; Notable for the following components:   Glucose, Bld 102 (*)    Calcium 8.8 (*)    Albumin 3.3 (*)    All other components within normal limits  LACTIC ACID, PLASMA  LIPASE, BLOOD    EKG None  Radiology CT ABDOMEN PELVIS W CONTRAST  Result Date: 09/12/2022 CLINICAL DATA:  Postoperative abdominal pain. Radiofrequency ablation of uterine fibroids performed on 09/04/2022 EXAM: CT ABDOMEN AND PELVIS WITH CONTRAST TECHNIQUE: Multidetector CT imaging of the abdomen and pelvis was performed using the standard protocol following bolus administration of intravenous contrast. RADIATION DOSE REDUCTION: This exam was performed according to the departmental dose-optimization program which includes automated exposure control, adjustment of the mA and/or kV according to patient size and/or use of iterative reconstruction technique. CONTRAST:  OMNIPAQUE IOHEXOL 300 MG/ML  SOLN COMPARISON:  CT 05/18/2022 FINDINGS: Lower chest: Minor bibasilar subsegmental atelectasis. Normal heart size. Hepatobiliary: No focal liver abnormality is seen. No gallstones, gallbladder wall thickening, or biliary dilatation. Pancreas: Unremarkable. No pancreatic ductal dilatation or surrounding inflammatory changes. Spleen: Normal in size without focal abnormality. Adrenals/Urinary Tract: Unremarkable adrenal glands. Kidneys enhance symmetrically without focal lesion, stone, or hydronephrosis. Ureters are nondilated. Urinary bladder appears unremarkable for the degree of distention. Stomach/Bowel: Stomach is within normal limits. Appendix is surgically absent. No evidence of bowel wall thickening, distention, or inflammatory changes. Vascular/Lymphatic: No significant vascular findings are present. No enlarged abdominal or pelvic lymph nodes. Reproductive: Multifibroid uterus. Dominant fibroids in the left fundal  region and lower uterine segment on the right both appear decreased in attenuation and slightly smaller in size when compared with the previous CT, likely representing post treatment changes. No adnexal masses. Other: No free fluid. No abdominopelvic fluid collection. No pneumoperitoneum. 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 No new or acute bony abnormality. IMPRESSION: 1. No acute abdominopelvic findings. 2. Multifibroid uterus. Dominant fibroids in the left fundal region and lower uterine segment on the right both appear decreased in attenuation and slightly smaller in size when compared with the previous CT, likely representing post treatment changes. Electronically Signed   By: Duanne Guess D.O.   On: 09/12/2022 12:31    Procedures Procedures    Medications Ordered in ED Medications  ondansetron (ZOFRAN) injection 4 mg (4 mg Intravenous Given 09/12/22 1108)  lactated ringers bolus 1,000 mL (0 mLs Intravenous Stopped 09/12/22 1315)  fentaNYL (SUBLIMAZE) injection 50 mcg (50 mcg Intravenous Given 09/12/22 1108)  iohexol (OMNIPAQUE) 300 MG/ML solution 100 mL (100 mLs Intravenous Contrast Given 09/12/22 1203)  alum & mag hydroxide-simeth (MAALOX/MYLANTA) 200-200-20 MG/5ML suspension 30 mL (30  mLs Oral Given 09/12/22 1314)    And  lidocaine (XYLOCAINE) 2 % viscous mouth solution 15 mL (15 mLs Oral Given 09/12/22 1314)  dicyclomine (BENTYL) capsule 10 mg (10 mg Oral Given 09/12/22 1313)    ED Course/ Medical Decision Making/ A&P                             Medical Decision Making Amount and/or Complexity of Data Reviewed Labs: ordered. Radiology: ordered.  Risk OTC drugs. Prescription drug management.     47 year old female with medical history significant for uterine fibroids, PCOS, status post uterine ablation for fibroids on 09/04/2022 who presents to the emergency department with abdominal pain.  The patient states that she is been having right-sided  abdominal pain, right upper quadrant pain over the past few days has been worsening.  She endorses some crampy lower abdominal discomfort, denies any vaginal bleeding or vaginal discharge.  No fevers or chills.  No urinary symptoms.  She endorses mild nausea, denies any vomiting.  She denies any history of gallstone disease.  On arrival, the patient was afebrile, not tachycardic or tachypneic, BP 127/78, saturating her percent on room air.  Sinus rhythm noted on cardiac telemetry.  Physical exam significant for mild suprapubic tenderness palpation, primary focus of tenderness on exam was the patient's right upper quadrant.  No rebound or guarding.  Negative Murphy sign.  Differential diagnosis includes cholelithiasis/cholecystitis, pancreatitis, diverticulosis/diverticulitis, nephrolithiasis, UTI/pyelonephritis, small bowel obstruction, symptomatic uterine fibroids, less likely ovarian pathology given the location of discomfort.  Patient with no urinary symptoms.  Laboratory evaluation significant for CBC without a leukocytosis, mild anemia at 11.8, lactic acid normal, lipase normal, CMP without significant biliary dysfunction, normal LFTs, normal renal function.  The patient denies any recent sexual activity, urine pregnancy canceled.  IV access obtained the patient was administered IV fluid bolus and IV fentanyl for pain control in addition to IV Zofran for nausea.  CT of the abdomen pelvis was performed: IMPRESSION:  1. No acute abdominopelvic findings.  2. Multifibroid uterus. Dominant fibroids in the left fundal region  and lower uterine segment on the right both appear decreased in  attenuation and slightly smaller in size when compared with the  previous CT, likely representing post treatment changes.    Discussed the results of the patient's diagnostic testing bedside.  On repeat assessment, she was feeling symptomatically improved.  She was tolerating oral intake.  She was administered  Maalox and Bentyl in addition to viscous lidocaine.  Symptoms could be due to underlying peptic ulcer disease which was unable to be seen on CT imaging.  Overall otherwise reassuring workup.  The patient's fibroids appear to be decreased in size status post her ablation.  She has outpatient follow-up with her OB/GYN.  I offered and provided a referral to outpatient gastroenterology for further workup of her right upper quadrant discomfort.  No evidence of cholelithiasis or cholecystitis and no other acute intra-abdominal abnormality noted on CT imaging.  All questions were answered bedside, return precautions were provided.  Will discharge the patient on a course of Pepcid and Protonix in addition to providing a prescription for Bentyl.  Stable for outpatient GI follow-up.  Final Clinical Impression(s) / ED Diagnoses Final diagnoses:  Right upper quadrant abdominal pain  Uterine leiomyoma, unspecified location    Rx / DC Orders ED Discharge Orders          Ordered    Ambulatory referral to  Gastroenterology        09/12/22 1259    famotidine (PEPCID) 20 MG tablet  2 times daily        09/12/22 1300    pantoprazole (PROTONIX) 40 MG tablet  Daily        09/12/22 1300    dicyclomine (BENTYL) 20 MG tablet  2 times daily        09/12/22 1300              Ernie Avena, MD 09/12/22 1333

## 2022-09-12 NOTE — Discharge Instructions (Addendum)
Uterine fibroids have shrunk in size compared to pretreatment.  Recommend you follow-up with your OB/GYN regarding this.  Your right upper quadrant pain could be due to a peptic ulcer as your CT scan was otherwise reassuring as was your laboratory workup.  A referral has been placed for outpatient follow-up with gastroenterology as sometimes an ulcer can cause this discomfort and can only be visualized on endoscopy.  In the meantime, we will treat you with Pepcid and Protonix in addition to Bentyl for abdominal cramping and spasm. Abdominal CT Results: IMPRESSION:  1. No acute abdominopelvic findings.  2. Multifibroid uterus. Dominant fibroids in the left fundal region  and lower uterine segment on the right both appear decreased in  attenuation and slightly smaller in size when compared with the  previous CT, likely representing post treatment changes.

## 2022-09-12 NOTE — ED Triage Notes (Signed)
Had surgery on April 23rd on uterine fibroids, having severe right sided abdominal pain, states had some prior to procedure but now worsening.

## 2022-09-17 ENCOUNTER — Other Ambulatory Visit: Payer: Self-pay | Admitting: Obstetrics and Gynecology

## 2022-09-17 DIAGNOSIS — R110 Nausea: Secondary | ICD-10-CM

## 2022-09-17 MED ORDER — ONDANSETRON HCL 4 MG PO TABS: 4.0000 mg | ORAL_TABLET | Freq: Three times a day (TID) | ORAL | 2 refills | Status: DC | PRN

## 2022-09-17 NOTE — Progress Notes (Signed)
Rx for zofran sent 2/2 to nausea.

## 2022-09-24 ENCOUNTER — Ambulatory Visit
Admission: RE | Admit: 2022-09-24 | Discharge: 2022-09-24 | Disposition: A | Discharge status: Home / Self Care | Payer: Medicaid Other | Source: Ambulatory Visit | Attending: Obstetrics and Gynecology | Admitting: Obstetrics and Gynecology

## 2022-09-24 ENCOUNTER — Encounter: Payer: Self-pay | Admitting: Obstetrics and Gynecology

## 2022-09-24 ENCOUNTER — Ambulatory Visit (INDEPENDENT_AMBULATORY_CARE_PROVIDER_SITE_OTHER): Payer: Medicaid Other | Admitting: Obstetrics and Gynecology

## 2022-09-24 VITALS — BP 147/85 | HR 79 | Ht 64.0 in | Wt 257.0 lb

## 2022-09-24 DIAGNOSIS — D250 Submucous leiomyoma of uterus: Secondary | ICD-10-CM | POA: Diagnosis not present

## 2022-09-24 DIAGNOSIS — D251 Intramural leiomyoma of uterus: Secondary | ICD-10-CM | POA: Diagnosis not present

## 2022-09-24 DIAGNOSIS — D252 Subserosal leiomyoma of uterus: Secondary | ICD-10-CM

## 2022-09-24 DIAGNOSIS — Z1231 Encounter for screening mammogram for malignant neoplasm of breast: Secondary | ICD-10-CM | POA: Diagnosis not present

## 2022-09-24 DIAGNOSIS — Z1239 Encounter for other screening for malignant neoplasm of breast: Secondary | ICD-10-CM

## 2022-09-24 DIAGNOSIS — Z4889 Encounter for other specified surgical aftercare: Secondary | ICD-10-CM

## 2022-09-24 NOTE — Progress Notes (Signed)
    Subjective:    Melissa [name] is a 47 y.o. female who presents to the clinic status post [place] uterine fibroid ablation  on 09/04/22. The patient is not having any pain.  Eating a regular diet without difficulty. Bowel movements are  improved, she did not some constipation previously. . No other significant postoperative concerns.  The following portions of the patient's history were reviewed and updated as appropriate: allergies, current medications, past family history, past medical history, past social history, past surgical history, and problem list..  Last pap smear was normal on 08/29/22.  Review of Systems Pertinent items are noted in HPI.   Objective:   BP (!) 147/85   Pulse 79   Ht 5\' 4"  (1.626 m)   Wt 257 lb (116.6 kg)   LMP 08/03/2022 Comment: negative u-preg on 09/04/22, has not had sex since then, per Patient and per PA  BMI 44.11 kg/m  Constitutional:  Well-developed, well-nourished female in no acute distress.   Skin: Skin is warm and dry, no rash noted, not diaphoretic,no erythema, no pallor.  Cardiovascular: Normal heart rate noted  Respiratory: Effort and breath sounds normal, no problems with respiration noted  Abdomen: Soft, bowel sounds active, non-tender, no abnormal masses  Incision: Healing well, no drainage, no erythema, no hernia, no seroma, no swelling, no dehiscence, incision well approximated  Pelvic:   Deferred   Surgical pathology (none received)  Assessment:   Doing well postoperatively.  Operative findings again reviewed. Pathology report discussed.   Plan:   1. Continue any current medications. 2. Wound care discussed. 3. Activity restrictions: none 4. Anticipated return to work: now. 5. Follow up as needed, virtual visit in 6 months 6.  Routine preventative health maintenance measures emphasized. Please refer to After Visit Summary for other counseling recommendations.    Mariel Aloe, MD, FACOG Attending Obstetrician &  Gynecologist [facility], [facility]

## 2022-10-20 ENCOUNTER — Other Ambulatory Visit: Payer: Self-pay

## 2022-10-20 ENCOUNTER — Emergency Department (HOSPITAL_BASED_OUTPATIENT_CLINIC_OR_DEPARTMENT_OTHER)
Admission: EM | Admit: 2022-10-20 | Discharge: 2022-10-20 | Disposition: A | Discharge status: Home / Self Care | Payer: Medicaid Other | Attending: Emergency Medicine | Admitting: Emergency Medicine

## 2022-10-20 ENCOUNTER — Encounter (HOSPITAL_BASED_OUTPATIENT_CLINIC_OR_DEPARTMENT_OTHER): Payer: Self-pay

## 2022-10-20 DIAGNOSIS — H66001 Acute suppurative otitis media without spontaneous rupture of ear drum, right ear: Secondary | ICD-10-CM | POA: Diagnosis not present

## 2022-10-20 DIAGNOSIS — H9201 Otalgia, right ear: Secondary | ICD-10-CM | POA: Diagnosis present

## 2022-10-20 DIAGNOSIS — R55 Syncope and collapse: Secondary | ICD-10-CM | POA: Diagnosis not present

## 2022-10-20 DIAGNOSIS — R1011 Right upper quadrant pain: Secondary | ICD-10-CM | POA: Insufficient documentation

## 2022-10-20 DIAGNOSIS — R42 Dizziness and giddiness: Secondary | ICD-10-CM | POA: Diagnosis not present

## 2022-10-20 MED ORDER — AMOXICILLIN-POT CLAVULANATE 875-125 MG PO TABS: 1.0000 | ORAL_TABLET | Freq: Two times a day (BID) | ORAL | 0 refills | Status: DC

## 2022-10-20 MED ORDER — MECLIZINE HCL 25 MG PO TABS: 25.0000 mg | ORAL_TABLET | Freq: Three times a day (TID) | ORAL | 0 refills | Status: DC | PRN

## 2022-10-20 NOTE — Discharge Instructions (Addendum)
Please read and follow all provided instructions.  Your diagnoses today include:  1. Non-recurrent acute suppurative otitis media of right ear without spontaneous rupture of tympanic membrane   2. Vertigo     Tests performed today include: EKG: no rhythm problems Vital signs. See below for your results today.   Medications prescribed:  Augmentin - antibiotic  You have been prescribed an antibiotic medicine: take the entire course of medicine even if you are feeling better. Stopping early can cause the antibiotic not to work.  Meclizine - medication for dizziness  Take any prescribed medications only as directed.  Home care instructions:  Follow any educational materials contained in this packet.  BE VERY CAREFUL not to take multiple medicines containing Tylenol (also called acetaminophen). Doing so can lead to an overdose which can damage your liver and cause liver failure and possibly death.   Follow-up instructions: Please follow-up with your primary care provider in the next 5 days for further evaluation of your symptoms.   Return instructions:  Please return to the Emergency Department if you experience worsening symptoms.  Please return if you have any other emergent concerns.  Additional Information:  Your vital signs today were: BP (!) 129/96   Pulse 91   Temp 98 F (36.7 C)   Resp 18   Ht 5\' 4"  (1.626 m)   Wt 116 kg   LMP 09/26/2022   SpO2 100%   BMI 43.90 kg/m  If your blood pressure (BP) was elevated above 135/85 this visit, please have this repeated by your doctor within one month. --------------

## 2022-10-20 NOTE — ED Triage Notes (Addendum)
Patient has swelling around right ear, dizziness and nausea. She stated she feels like its vertigo. She is also having right side pain.

## 2022-10-20 NOTE — ED Provider Notes (Signed)
[place] [facility]GENCY DEPARTMENT AT [facility] Provider Note   CSN: [id number] Arrival date & time: 10/20/22  1254     History  Chief Complaint  Patient presents with   Near Syncope   Abdominal Pain    Melissa [name] is a 47 y.o. female.  Patient with history of uterine fibroids, chronic right upper quadrant abdominal pain, history of ear surgery and ear infections --presents to the emergency department today for evaluation of right ear pain, right sided facial swelling around the ear and is described as a vertigo.  She states that this started about 2 or 3 days ago and is worsened.  No drainage from the ear.  No associated fevers.  No nausea, vomiting, or diarrhea.  She had a mass removed from her ear in 2011.  No lightheadedness or syncope.  No chest pain or shortness of breath.  Patient also reports ongoing right upper quadrant pain described as sharp in nature.  She has had this evaluated in the emergency department on several occasions and has had 3 CT scans of the abdomen and pelvis over the past 6 months.  These have been unrevealing.  Patient is wondering if the pain is related to where her bra is pushing over her anterior chest.       Home Medications Prior to Admission medications   Medication Sig Start Date End Date Taking? Authorizing Provider  dicyclomine (BENTYL) 20 MG tablet Take 1 tablet (20 mg total) by mouth 2 (two) times daily. 09/12/22   Ernie Avena, MD  famotidine (PEPCID) 20 MG tablet Take 1 tablet (20 mg total) by mouth 2 (two) times daily. 09/12/22   Ernie Avena, MD  ibuprofen (ADVIL) 600 MG tablet Take 1 tablet (600 mg total) by mouth every 6 (six) hours as needed for moderate pain or cramping. 09/04/22   Warden Fillers, MD  Lidocaine HCl 2 % CREA Apply 1 Application topically 2 (two) times daily. 03/06/22   Cristopher Peru, PA-C  loratadine-pseudoephedrine (CLARITIN-D 12-HOUR) 5-120 MG tablet Take 1 tablet by mouth 2 (two) times daily.     [provider]  ondansetron (ZOFRAN) 4 MG tablet Take 1 tablet (4 mg total) by mouth every 8 (eight) hours as needed for nausea or vomiting. 09/17/22   Warden Fillers, MD  OVER THE COUNTER MEDICATION Vital protein collagen gummies 4 per day    [provider]  pantoprazole (PROTONIX) 40 MG tablet Take 1 tablet (40 mg total) by mouth daily. 09/12/22 10/12/22  Ernie Avena, MD  promethazine (PHENERGAN) 25 MG tablet Take 1 tablet (25 mg total) by mouth every 6 (six) hours as needed for nausea or vomiting. 09/07/22   Marny Lowenstein, PA-C  diphenhydrAMINE (BENADRYL) 12.5 MG/5ML liquid Take 12.5 mg by mouth once.  08/01/11  [provider]      Allergies    [place] scolymus (artichoke)] and Prednisone    Review of Systems   Review of Systems  Physical Exam Updated Vital Signs BP (!) 129/96   Pulse 91   Temp 98 F (36.7 C)   Resp 18   Ht 5\' 4"  (1.626 m)   Wt 116 kg   LMP 09/26/2022   SpO2 100%   BMI 43.90 kg/m  Physical Exam Vitals and nursing note reviewed.  Constitutional:      Appearance: She is well-developed.  HENT:     Head: Normocephalic and atraumatic.     Jaw: No trismus.     Salivary  Glands: Right salivary gland is not diffusely enlarged or tender. Left salivary gland is not diffusely enlarged or tender.     Right Ear: Ear canal and external ear normal. A middle ear effusion is present. Tympanic membrane is scarred, erythematous and bulging. Tympanic membrane is not perforated.     Left Ear: Ear canal and external ear normal.  No middle ear effusion. Tympanic membrane is scarred. Tympanic membrane is not perforated, erythematous or bulging.     Nose: Nose normal. No mucosal edema or rhinorrhea.     Mouth/Throat:     Mouth: Mucous membranes are moist. Mucous membranes are not dry. No oral lesions.     Pharynx: Uvula midline. No oropharyngeal exudate, posterior oropharyngeal erythema or uvula swelling.     Tonsils: No tonsillar abscesses.   Eyes:     General:        Right eye: No discharge.        Left eye: No discharge.     Conjunctiva/sclera: Conjunctivae normal.  Cardiovascular:     Rate and Rhythm: Normal rate and regular rhythm.     Heart sounds: Normal heart sounds.  Pulmonary:     Effort: Pulmonary effort is normal. No respiratory distress.     Breath sounds: Normal breath sounds. No wheezing or rales.  Abdominal:     Palpations: Abdomen is soft.     Tenderness: There is no abdominal tenderness. There is no guarding or rebound. Negative signs include Murphy's sign and McBurney's sign.     Comments: Abdomen is soft and nontender however she does have some tenderness over the lower costal margin of the right lower chest  Musculoskeletal:     Cervical back: Normal range of motion and neck supple.  Lymphadenopathy:     Head:     Right side of head: Tonsillar adenopathy present. No submental, submandibular, preauricular or posterior auricular adenopathy.     Left side of head: No submental, submandibular, tonsillar, preauricular or posterior auricular adenopathy.     Cervical: No cervical adenopathy.  Skin:    General: Skin is warm and dry.  Neurological:     Mental Status: She is alert.  Psychiatric:        Mood and Affect: Mood normal.     ED Results / Procedures / Treatments   Labs (all labs ordered are listed, but only abnormal results are displayed) Labs Reviewed - No data to display  ED ECG REPORT   Date: 10/20/2022  Rate: 84  Rhythm: normal sinus rhythm  QRS Axis: normal  Intervals: normal  ST/T Wave abnormalities: normal  Conduction Disutrbances:none  Narrative Interpretation:   Old EKG Reviewed: unchanged from 11/2021  I have personally reviewed the EKG tracing and agree with the computerized printout as noted.   Radiology No results found.  Procedures Procedures    Medications Ordered in ED Medications - No data to display  ED Course/ Medical Decision Making/ A&P    Patient  seen and examined. History obtained directly from patient.  I also reviewed previous workups for the patient's abdominal symptoms.  Labs/EKG: EKG reviewed and interpreted as above  Imaging: None ordered  Medications/Fluids: Offered meclizine, patient declines  Most recent vital signs reviewed and are as follows: BP (!) 129/96   Pulse 91   Temp 98 F (36.7 C)   Resp 18   Ht 5\' 4"  (1.626 m)   Wt 116 kg   LMP 09/26/2022   SpO2 100%   BMI 43.90 kg/m  Initial impression: Right-sided otitis media likely causing patient's vertiginous symptoms.  No signs of parotitis, otitis externa, malignant otitis externa, other facial cellulitis.  Home treatment plan: Augmentin and meclizine, OTC meds  Return instructions discussed with patient: Return with fever, worsening redness, pain or swelling, new symptoms or other concerns  Follow-up instructions discussed with patient: PCP follow-up in 5 to 7 days if not improving  Patient did raise concern of continued right upper quadrant/lower chest abdominal pain.  She has been seen for this in the ED several times with negative imaging.  Her symptoms today are unchanged from previous.  They are most consistent with a superficial nerve irritation or possible intercostal muscle strain.  I do not feel that additional lab work or imaging would be helpful at this time.  Encourage PCP follow-up.  Her exam is reassuring.                             Medical Decision Making  Patient with findings as above, most consistent with an acute otitis media given her ENT exam.  She has associated vertigo.  No lightheadedness or syncope.  Low concern for arrhythmia.  EKG is unremarkable.  Do not feel that patient requires cardiac workup at this time as she has not had any chest pains typical for ACS.  Low concern for PE.  Will treat otitis externa give meclizine for symptom control.  She will follow-up if symptoms or not improving and return with worsening.  The  patient's vital signs, pertinent lab work and imaging were reviewed and interpreted as discussed in the ED course. Hospitalization was considered for further testing, treatments, or serial exams/observation. However as patient is well-appearing, has a stable exam, and reassuring studies today, I do not feel that they warrant admission at this time. This plan was discussed with the patient who verbalizes agreement and comfort with this plan and seems reliable and able to return to the Emergency Department with worsening or changing symptoms.          Final Clinical Impression(s) / ED Diagnoses Final diagnoses:  Non-recurrent acute suppurative otitis media of right ear without spontaneous rupture of tympanic membrane  Vertigo    Rx / DC Orders ED Discharge Orders     None         Renne Crigler, PA-C 10/20/22 1351    Terrilee Files, MD 10/20/22 [id number]

## 2022-11-08 ENCOUNTER — Other Ambulatory Visit (HOSPITAL_BASED_OUTPATIENT_CLINIC_OR_DEPARTMENT_OTHER): Payer: Self-pay

## 2022-11-08 ENCOUNTER — Encounter (HOSPITAL_BASED_OUTPATIENT_CLINIC_OR_DEPARTMENT_OTHER): Payer: Self-pay

## 2022-11-08 ENCOUNTER — Other Ambulatory Visit: Payer: Self-pay

## 2022-11-08 ENCOUNTER — Emergency Department (HOSPITAL_BASED_OUTPATIENT_CLINIC_OR_DEPARTMENT_OTHER)
Admission: EM | Admit: 2022-11-08 | Discharge: 2022-11-08 | Disposition: A | Discharge status: Home / Self Care | Payer: Medicaid Other | Attending: Emergency Medicine | Admitting: Emergency Medicine

## 2022-11-08 DIAGNOSIS — R21 Rash and other nonspecific skin eruption: Secondary | ICD-10-CM | POA: Diagnosis present

## 2022-11-08 DIAGNOSIS — B029 Zoster without complications: Secondary | ICD-10-CM | POA: Diagnosis not present

## 2022-11-08 LAB — PREGNANCY, URINE: Preg Test, Ur: NEGATIVE

## 2022-11-08 MED ORDER — DEXAMETHASONE 4 MG PO TABS
6.0000 mg | ORAL_TABLET | Freq: Every day | ORAL | 0 refills | Status: DC
  Filled 2022-11-08: qty 8, 5d supply, fill #0

## 2022-11-08 MED ORDER — VALACYCLOVIR HCL 1 G PO TABS
1000.0000 mg | ORAL_TABLET | Freq: Three times a day (TID) | ORAL | 0 refills | Status: AC
  Filled 2022-11-08: qty 21, 7d supply, fill #0

## 2022-11-08 MED ORDER — FLUORESCEIN SODIUM 1 MG OP STRP
1.0000 | ORAL_STRIP | Freq: Once | OPHTHALMIC | Status: AC
  Administered 2022-11-08: 1 via OPHTHALMIC
  Filled 2022-11-08: qty 1

## 2022-11-08 NOTE — ED Notes (Signed)
Pt. Has itching and rash on her face with reports of pain.  Pt. States she is trying to get pregnant.

## 2022-11-08 NOTE — ED Provider Notes (Signed)
Melissa [name] Provider Note   CSN: [id number] Arrival date & time: 11/08/22  1346     History  Chief Complaint  Patient presents with   Rash    Melissa [name] is a 47 y.o. female.  Melissa [name] is a 47 y.o. female with a history of PCOS, uterine fibroids and shingles who presents to the emergency department for evaluation of facial rash associated with pain and itching.  She reports that along the right cheek she started to notice some facial pain and itching yesterday.  At that time she had just a small bump which she assumed was a pimple on her face.  This morning when she woke up she had a larger cluster of lesions on the right cheek just adjacent to the nose and has had increasing facial pain and itching.  She reports some associated pain in the right ear as well as mildly decreased hearing and some tinnitus.  No vertigo.  No facial weakness or droop.  She has not noticed any similar lesions or rash elsewhere.  Did have shingles infection once before on her face.  The history is provided by the patient and medical records.  Rash      Home Medications Prior to Admission medications   Medication Sig Start Date End Date Taking? Authorizing Provider  dexamethasone (DECADRON) 4 MG tablet Take 1.5 tablets (6 mg total) by mouth daily. 11/08/22  Yes Dartha Lodge, PA-C  valACYclovir (VALTREX) 1000 MG tablet Take 1 tablet (1,000 mg total) by mouth 3 (three) times daily. 11/08/22  Yes Dartha Lodge, PA-C  amoxicillin-clavulanate (AUGMENTIN) 875-125 MG tablet Take 1 tablet by mouth every 12 (twelve) hours. 10/20/22   Renne Crigler, PA-C  dicyclomine (BENTYL) 20 MG tablet Take 1 tablet (20 mg total) by mouth 2 (two) times daily. 09/12/22   Ernie Avena, MD  famotidine (PEPCID) 20 MG tablet Take 1 tablet (20 mg total) by mouth 2 (two) times daily. 09/12/22   Ernie Avena, MD  ibuprofen (ADVIL) 600 MG tablet Take 1 tablet (600 mg total) by mouth every  6 (six) hours as needed for moderate pain or cramping. 09/04/22   Warden Fillers, MD  Lidocaine HCl 2 % CREA Apply 1 Application topically 2 (two) times daily. 03/06/22   Cristopher Peru, PA-C  loratadine-pseudoephedrine (CLARITIN-D 12-HOUR) 5-120 MG tablet Take 1 tablet by mouth 2 (two) times daily.    [provider]  meclizine (ANTIVERT) 25 MG tablet Take 1 tablet (25 mg total) by mouth 3 (three) times daily as needed for dizziness. 10/20/22   Renne Crigler, PA-C  ondansetron (ZOFRAN) 4 MG tablet Take 1 tablet (4 mg total) by mouth every 8 (eight) hours as needed for nausea or vomiting. 09/17/22   Warden Fillers, MD  OVER THE COUNTER MEDICATION Vital protein collagen gummies 4 per day    [provider]  pantoprazole (PROTONIX) 40 MG tablet Take 1 tablet (40 mg total) by mouth daily. 09/12/22 10/12/22  Ernie Avena, MD  promethazine (PHENERGAN) 25 MG tablet Take 1 tablet (25 mg total) by mouth every 6 (six) hours as needed for nausea or vomiting. 09/07/22   Marny Lowenstein, PA-C  diphenhydrAMINE (BENADRYL) 12.5 MG/5ML liquid Take 12.5 mg by mouth once.  08/01/11  [provider]      Allergies    Melissa [name] scolymus (artichoke)] and Prednisone    Review of Systems   Review of Systems  HENT:  Positive  for ear pain and hearing loss. Negative for facial swelling.   Skin:  Positive for rash.    Physical Exam Updated Vital Signs BP (!) 130/96   Pulse 94   Temp 98.1 F (36.7 C) (Oral)   Resp 16   Ht 5\' 4"  (1.626 m)   Wt 115.7 kg   LMP 09/26/2022   SpO2 99%   BMI 43.77 kg/m  Physical Exam Vitals and nursing note reviewed.  Constitutional:      General: She is not in acute distress.    Appearance: Normal appearance. She is well-developed. She is not ill-appearing or diaphoretic.  HENT:     Head: Normocephalic and atraumatic.     Comments: Small cluster of vesicular lesions with some crusting on the right cheek adjacent to the nasolabial fold, no other  lesions noted on the face but the cheek is hyper sensitive to touch.  Testicles pain along the V2 distribution of the right trigeminal nerve.  The right ear does not have any vesicular lesions in the auditory canal or on the auricle but the auditory canal is erythematous    Nose:     Comments: No intranasal vesicular lesions    Mouth/Throat:     Mouth: Mucous membranes are moist.     Pharynx: Oropharynx is clear.     Comments: No intraoral vesicular lesions Eyes:     General:        Right eye: No discharge.        Left eye: No discharge.     Extraocular Movements: Extraocular movements intact.     Pupils: Pupils are equal, round, and reactive to light.     Comments: Fluorescein staining of the right eye with no dendritic pattern to suggest ocular shingles  Pulmonary:     Effort: Pulmonary effort is normal. No respiratory distress.  Musculoskeletal:     Cervical back: Neck supple.  Lymphadenopathy:     Cervical: No cervical adenopathy.  Skin:    General: Skin is warm and dry.  Neurological:     Mental Status: She is alert and oriented to person, place, and time.     Coordination: Coordination normal.  Psychiatric:        Mood and Affect: Mood normal.        Behavior: Behavior normal.     ED Results / Procedures / Treatments   Labs (all labs ordered are listed, but only abnormal results are displayed) Labs Reviewed  PREGNANCY, URINE    EKG None  Radiology No results found.  Procedures Procedures    Medications Ordered in ED Medications  fluorescein ophthalmic strip 1 strip (1 strip Right Eye Given by Other 11/08/22 1500)    ED Course/ Medical Decision Making/ A&P                             Medical Decision Making  47 year old female presents with rash and facial pain.  Presentation is consistent with shingles with unilateral rash in the V2 distribution of the trigeminal nerve.  Patient does report some ear pain and tinnitus, there is some erythema of the ear  canal, TM is intact, no vesicles present over the ear and no evidence of facial paralysis but this could be an early presentation of Ramsay Hunt syndrome.  Fortunately patient has presented within the first 72 hours of symptoms.  Will treat with Valtrex.  Patient has reported allergy to prednisone but has tolerated Decadron without  difficulty.  Encouraged to use of NSAIDs as needed and antihistamines as needed for itching.  Encourage PCP follow-up and ENT referral if ear pain and tenderness does not resolve.  At this time there does not appear to be any evidence of an acute emergency medical condition requiring further emergent evaluation and the patient appears stable for discharge with appropriate outpatient follow up. Diagnosis and return precautions discussed with patient who verbalizes understanding and is agreeable to discharge.           Final Clinical Impression(s) / ED Diagnoses Final diagnoses:  Herpes zoster without complication    Rx / DC Orders ED Discharge Orders          Ordered    valACYclovir (VALTREX) 1000 MG tablet  3 times daily        11/08/22 1611    dexamethasone (DECADRON) 4 MG tablet  Daily        11/08/22 1611              Dartha Lodge, [place] 11/08/22 1833    Terald Sleeper, MD 11/09/22 [phone]

## 2022-11-08 NOTE — Discharge Instructions (Addendum)
You have shingles.  Because of the location of the shingles it can affect your ear can cause hearing loss, tinnitus or ringing in the ear as well as facial pain.  Occasionally it can also cause facial droop or facial paralysis.  This is typically temporary.  If symptoms or not resolving you should follow-up with the ear nose and throat specialist Dr. Jenne Pane on your paperwork today.  To help treat the shingles infection take Valtrex 3 times daily for the next 7 days.  You can also take prescribed Decadron, this is a steroid to help reduce inflammation and pain.  For additional pain relief you can take ibuprofen 600 mg every 6 hours as needed.  For itching you can use Benadryl at nighttime and you can take Zyrtec 10 mg once daily as needed for itching.

## 2022-11-08 NOTE — ED Triage Notes (Signed)
Pt is concerned that she has shingles.  Headache that wouldn't go away on right side, along with itching. Pt has a spot that she thought was a large pimple but has spread.

## 2022-11-30 DIAGNOSIS — R21 Rash and other nonspecific skin eruption: Secondary | ICD-10-CM | POA: Diagnosis not present

## 2022-11-30 DIAGNOSIS — H53143 Visual discomfort, bilateral: Secondary | ICD-10-CM | POA: Diagnosis not present

## 2022-11-30 DIAGNOSIS — R232 Flushing: Secondary | ICD-10-CM | POA: Diagnosis not present

## 2022-11-30 DIAGNOSIS — R110 Nausea: Secondary | ICD-10-CM | POA: Diagnosis not present

## 2022-11-30 DIAGNOSIS — R519 Headache, unspecified: Secondary | ICD-10-CM | POA: Diagnosis not present

## 2022-11-30 DIAGNOSIS — G43019 Migraine without aura, intractable, without status migrainosus: Secondary | ICD-10-CM | POA: Diagnosis not present

## 2022-12-14 ENCOUNTER — Other Ambulatory Visit: Payer: Self-pay

## 2022-12-14 ENCOUNTER — Emergency Department (HOSPITAL_BASED_OUTPATIENT_CLINIC_OR_DEPARTMENT_OTHER): Payer: Medicaid Other

## 2022-12-14 ENCOUNTER — Encounter: Payer: Self-pay | Admitting: Obstetrics and Gynecology

## 2022-12-14 ENCOUNTER — Ambulatory Visit (INDEPENDENT_AMBULATORY_CARE_PROVIDER_SITE_OTHER): Payer: Medicaid Other | Admitting: Obstetrics and Gynecology

## 2022-12-14 ENCOUNTER — Emergency Department (HOSPITAL_BASED_OUTPATIENT_CLINIC_OR_DEPARTMENT_OTHER)
Admission: EM | Admit: 2022-12-14 | Discharge: 2022-12-14 | Disposition: A | Discharge status: Home / Self Care | Payer: Medicaid Other | Source: Home / Self Care | Attending: Emergency Medicine | Admitting: Emergency Medicine

## 2022-12-14 ENCOUNTER — Encounter (HOSPITAL_BASED_OUTPATIENT_CLINIC_OR_DEPARTMENT_OTHER): Payer: Self-pay

## 2022-12-14 ENCOUNTER — Encounter: Payer: Self-pay | Admitting: Family Medicine

## 2022-12-14 VITALS — BP 137/79 | HR 81 | Wt 253.0 lb

## 2022-12-14 DIAGNOSIS — D250 Submucous leiomyoma of uterus: Secondary | ICD-10-CM

## 2022-12-14 DIAGNOSIS — R110 Nausea: Secondary | ICD-10-CM

## 2022-12-14 DIAGNOSIS — D252 Subserosal leiomyoma of uterus: Secondary | ICD-10-CM | POA: Diagnosis not present

## 2022-12-14 DIAGNOSIS — R1031 Right lower quadrant pain: Secondary | ICD-10-CM | POA: Diagnosis not present

## 2022-12-14 DIAGNOSIS — R109 Unspecified abdominal pain: Secondary | ICD-10-CM | POA: Diagnosis not present

## 2022-12-14 DIAGNOSIS — N938 Other specified abnormal uterine and vaginal bleeding: Secondary | ICD-10-CM | POA: Diagnosis not present

## 2022-12-14 DIAGNOSIS — D251 Intramural leiomyoma of uterus: Secondary | ICD-10-CM | POA: Diagnosis not present

## 2022-12-14 DIAGNOSIS — D259 Leiomyoma of uterus, unspecified: Secondary | ICD-10-CM | POA: Diagnosis not present

## 2022-12-14 LAB — URINALYSIS, ROUTINE W REFLEX MICROSCOPIC
Bilirubin Urine: NEGATIVE
Glucose, UA: NEGATIVE mg/dL
Ketones, ur: NEGATIVE mg/dL
Leukocytes,Ua: NEGATIVE
Nitrite: NEGATIVE
Protein, ur: NEGATIVE mg/dL
Specific Gravity, Urine: >=1.03 (ref 1.005–1.030)
pH: 5.5 (ref 5.0–8.0)

## 2022-12-14 LAB — URINALYSIS, MICROSCOPIC (REFLEX)

## 2022-12-14 LAB — PREGNANCY, URINE: Preg Test, Ur: NEGATIVE

## 2022-12-14 MED ORDER — KETOROLAC TROMETHAMINE 60 MG/2ML IM SOLN
30.0000 mg | Freq: Once | INTRAMUSCULAR | Status: AC
  Administered 2022-12-14: 30 mg via INTRAMUSCULAR
  Filled 2022-12-14: qty 2

## 2022-12-14 MED ORDER — ONDANSETRON HCL 4 MG PO TABS
4.0000 mg | ORAL_TABLET | Freq: Three times a day (TID) | ORAL | 2 refills | Status: AC | PRN
Start: 2022-12-14 — End: ?

## 2022-12-14 MED ORDER — BISACODYL 5 MG PO TBEC: 5.0000 mg | DELAYED_RELEASE_TABLET | Freq: Every day | ORAL | 0 refills | Status: DC | PRN

## 2022-12-14 MED ORDER — TRANEXAMIC ACID 650 MG PO TABS
1300.0000 mg | ORAL_TABLET | Freq: Three times a day (TID) | ORAL | 4 refills | Status: AC
Start: 2022-12-14 — End: ?

## 2022-12-14 NOTE — Progress Notes (Signed)
  CC: heavy bleeding Subjective:    Patient ID: Melissa [name], female    DOB: 24-Feb-1976, 47 y.o.   MRN: [id number]  HPI Pt seen for follow up of bleeding s/p Sonata treatment.  Pt states bleeding is slightly improved but still lasts 7 days.  She will pass clots and "tissue."  Pt notes increased breast tenderness during her menses.  Increased nausea as well.   Review of Systems     Objective:   Physical Exam Vitals:   12/14/22 0842  BP: 137/79  Pulse: 81         Assessment & Plan:   1. Intramural, submucous, and subserous leiomyoma of uterus S/p sonata treatment.  Allow another 2-3 months to see if bleeding continues to improve.  - tranexamic acid (LYSTEDA) 650 MG TABS tablet; Take 2 tablets (1,300 mg total) by mouth 3 (three) times daily. Take during menses for a maximum of five days  Dispense: 30 tablet; Refill: 4  2. DUB (dysfunctional uterine bleeding) Rx for lysteda to hopefully address bleeding while waiting for Sonata treatment effects to mature.  - tranexamic acid (LYSTEDA) 650 MG TABS tablet; Take 2 tablets (1,300 mg total) by mouth 3 (three) times daily. Take during menses for a maximum of five days  Dispense: 30 tablet; Refill: 4 - ondansetron (ZOFRAN) 4 MG tablet; Take 1 tablet (4 mg total) by mouth every 8 (eight) hours as needed for nausea or vomiting.  Dispense: 20 tablet; Refill: 2  3. Nausea Rx for control of nausea as needed  - ondansetron (ZOFRAN) 4 MG tablet; Take 1 tablet (4 mg total) by mouth every 8 (eight) hours as needed for nausea or vomiting.  Dispense: 20 tablet; Refill: 2  F/u in 3 months  I spent 20 minutes dedicated to the care of this patient including previsit review of records, face to face time with the patient discussing treatment options and post visit testing.   Warden Fillers, MD Faculty Attending, [facility]

## 2022-12-14 NOTE — Progress Notes (Signed)
Patient here to follow up from Transcervical Sonata uterine fibroid radiofrequency ablation procedure that took place in April 2024.  Concerns:  Patient stated that she "sheds a lot of tissue" during her periods. She also experiences light headedness, nausea etc.

## 2022-12-14 NOTE — ED Provider Notes (Addendum)
[place] [facility] Provider Note   CSN: [id number] Arrival date & time: 12/14/22  1012     History  Chief Complaint  Patient presents with   Flank Pain    Delories Mauri is a 47 y.o. female.  HPI   47 year old female presents emergency department with right side abdominal pain.  Patient states this has been going on for many months.  She states that it is present daily and persistent.  She says the discomfort can be anywhere from a light ache to a crampy sensation or start stabbing.  She also feels like she is bloated with increased gas.  She does struggle with constipation.  Pain does not wrap around into the anterior/lower abdomen.  No genitourinary symptoms.  Denies any fever or chills.  She has had decreased appetite but no vomiting.  Home Medications Prior to Admission medications   Medication Sig Start Date End Date Taking? Authorizing Provider  amoxicillin-clavulanate (AUGMENTIN) 875-125 MG tablet Take 1 tablet by mouth every 12 (twelve) hours. Patient not taking: Reported on 12/14/2022 10/20/22   Renne Crigler, PA-C  dexamethasone (DECADRON) 4 MG tablet Take 1.5 tablets (6 mg total) by mouth daily. Patient not taking: Reported on 12/14/2022 11/08/22   Dartha Lodge, PA-C  dicyclomine (BENTYL) 20 MG tablet Take 1 tablet (20 mg total) by mouth 2 (two) times daily. Patient not taking: Reported on 12/14/2022 09/12/22   Ernie Avena, MD  famotidine (PEPCID) 20 MG tablet Take 1 tablet (20 mg total) by mouth 2 (two) times daily. Patient not taking: Reported on 12/14/2022 09/12/22   Ernie Avena, MD  ibuprofen (ADVIL) 600 MG tablet Take 1 tablet (600 mg total) by mouth every 6 (six) hours as needed for moderate pain or cramping. Patient not taking: Reported on 12/14/2022 09/04/22   Warden Fillers, MD  Lidocaine HCl 2 % CREA Apply 1 Application topically 2 (two) times daily. Patient not taking: Reported on 12/14/2022 03/06/22   Cristopher Peru, PA-C   loratadine-pseudoephedrine (CLARITIN-D 12-HOUR) 5-120 MG tablet Take 1 tablet by mouth 2 (two) times daily. Patient not taking: Reported on 12/14/2022    [provider]  meclizine (ANTIVERT) 25 MG tablet Take 1 tablet (25 mg total) by mouth 3 (three) times daily as needed for dizziness. Patient not taking: Reported on 12/14/2022 10/20/22   Renne Crigler, PA-C  ondansetron (ZOFRAN) 4 MG tablet Take 1 tablet (4 mg total) by mouth every 8 (eight) hours as needed for nausea or vomiting. 12/14/22   Warden Fillers, MD  OVER THE COUNTER MEDICATION Vital protein collagen gummies 4 per day Patient not taking: Reported on 12/14/2022    [provider]  pantoprazole (PROTONIX) 40 MG tablet Take 1 tablet (40 mg total) by mouth daily. 09/12/22 10/12/22  Ernie Avena, MD  promethazine (PHENERGAN) 25 MG tablet Take 1 tablet (25 mg total) by mouth every 6 (six) hours as needed for nausea or vomiting. Patient not taking: Reported on 12/14/2022 09/07/22   Marny Lowenstein, PA-C  tranexamic acid (LYSTEDA) 650 MG TABS tablet Take 2 tablets (1,300 mg total) by mouth 3 (three) times daily. Take during menses for a maximum of five days 12/14/22   Warden Fillers, MD  valACYclovir (VALTREX) 1000 MG tablet Take 1 tablet (1,000 mg total) by mouth 3 (three) times daily. Patient not taking: Reported on 12/14/2022 11/08/22   Dartha Lodge, PA-C  diphenhydrAMINE (BENADRYL) 12.5 MG/5ML liquid Take 12.5 mg by mouth once.  08/01/11  [provider]      Allergies    [place] scolymus (artichoke)] and Prednisone    Review of Systems   Review of Systems  Constitutional:  Positive for appetite change and fatigue. Negative for fever.  Respiratory:  Negative for shortness of breath.   Cardiovascular:  Negative for chest pain.  Gastrointestinal:  Positive for abdominal pain and constipation. Negative for vomiting.  Genitourinary:  Positive for flank pain.  Musculoskeletal:  Negative for back pain.  Skin:   Negative for rash.  Neurological:  Negative for headaches.    Physical Exam Updated Vital Signs BP 137/78   Pulse 87   Temp 98.1 F (36.7 C) (Oral)   Resp 18   Ht 5\' 4"  (1.626 m)   Wt 114 kg   LMP 12/03/2022 (Exact Date)   SpO2 100%   BMI 43.14 kg/m  Physical Exam Vitals and nursing note reviewed.  Constitutional:      General: She is not in acute distress.    Appearance: Normal appearance.  HENT:     Head: Normocephalic.     Mouth/Throat:     Mouth: Mucous membranes are moist.  Cardiovascular:     Rate and Rhythm: Normal rate.  Pulmonary:     Effort: Pulmonary effort is normal. No respiratory distress.  Abdominal:     Palpations: Abdomen is soft.     Tenderness: There is no abdominal tenderness. There is no guarding or rebound.  Musculoskeletal:     Comments: No rash on flank  Skin:    General: Skin is warm.     Findings: No rash.  Neurological:     Mental Status: She is alert and oriented to person, place, and time. Mental status is at baseline.  Psychiatric:        Mood and Affect: Mood normal.     ED Results / Procedures / Treatments   Labs (all labs ordered are listed, but only abnormal results are displayed) Labs Reviewed  URINALYSIS, ROUTINE W REFLEX MICROSCOPIC - Abnormal; Notable for the following components:      Result Value   Hgb urine dipstick TRACE (*)    All other components within normal limits  URINALYSIS, MICROSCOPIC (REFLEX) - Abnormal; Notable for the following components:   Bacteria, UA FEW (*)    All other components within normal limits  PREGNANCY, URINE    EKG None  Radiology No results found.  Procedures Procedures    Medications Ordered in ED Medications - No data to display  ED Course/ Medical Decision Making/ A&P                                 Medical Decision Making Amount and/or Complexity of Data Reviewed Labs: ordered. Radiology: ordered.  Risk OTC drugs. Prescription drug management.   47 year old  female presents the emergency department with right-sided flank pain.  Patient states is been ongoing for the past couple months.  She feels like she has increased gas and constipation.  Vital signs are normal and stable.  Abdomen is benign.  X-ray shows bowel gas pattern and moderate stool.  Most likely constipation.  Urinalysis showed a small amount of blood.  Given the flank pain a CT renal study was done which showed no acute finding.  Will discussed with patient bowel regimen and plan for outpatient follow-up.  Patient at this time appears safe and stable for discharge and close outpatient  follow up. Discharge plan and strict return to ED precautions discussed, patient verbalizes understanding and agreement.        Final Clinical Impression(s) / ED Diagnoses Final diagnoses:  None    Rx / DC Orders ED Discharge Orders     None         Rozelle Logan, DO 12/14/22 1527    Keylin Ferryman, Clabe Seal, DO 12/14/22 1531

## 2022-12-14 NOTE — Discharge Instructions (Addendum)
You have been seen and discharged from the emergency department.  Your x-ray showed bowel gas pattern and constipation.  CT of the abdomen showed no kidney stone or other acute abnormality.  Take stool softener as prescribed and as needed.  Stay well-hydrated.  Follow-up with your primary provider for further evaluation and further care. Take home medications as prescribed. If you have any worsening symptoms or further concerns for your health please return to an emergency department for further evaluation.

## 2022-12-14 NOTE — ED Triage Notes (Signed)
Patient is having right side flank pain for months. She stated she has been seen at multiple places for the same thing but has not been given any answers. She denied any injury. Movement does not change with movement. No urinary symptoms. She stated it does get worse when she is constipated.

## 2022-12-20 DIAGNOSIS — M21611 Bunion of right foot: Secondary | ICD-10-CM | POA: Diagnosis not present

## 2022-12-20 DIAGNOSIS — M7989 Other specified soft tissue disorders: Secondary | ICD-10-CM | POA: Diagnosis not present

## 2022-12-27 ENCOUNTER — Encounter: Payer: Self-pay | Admitting: Obstetrics and Gynecology

## 2023-01-01 ENCOUNTER — Ambulatory Visit: Payer: Medicaid Other

## 2023-01-05 DIAGNOSIS — L299 Pruritus, unspecified: Secondary | ICD-10-CM | POA: Diagnosis not present

## 2023-01-05 DIAGNOSIS — Z113 Encounter for screening for infections with a predominantly sexual mode of transmission: Secondary | ICD-10-CM | POA: Diagnosis not present

## 2023-01-09 ENCOUNTER — Encounter: Payer: Self-pay | Admitting: Obstetrics and Gynecology

## 2023-01-09 ENCOUNTER — Other Ambulatory Visit: Payer: Self-pay

## 2023-01-09 MED ORDER — FLUCONAZOLE 150 MG PO TABS: 150.0000 mg | ORAL_TABLET | Freq: Once | ORAL | 1 refills | Status: AC

## 2023-01-15 DIAGNOSIS — H7292 Unspecified perforation of tympanic membrane, left ear: Secondary | ICD-10-CM | POA: Diagnosis not present

## 2023-01-15 DIAGNOSIS — H90A31 Mixed conductive and sensorineural hearing loss, unilateral, right ear with restricted hearing on the contralateral side: Secondary | ICD-10-CM | POA: Diagnosis not present

## 2023-01-21 ENCOUNTER — Ambulatory Visit: Payer: Medicaid Other

## 2023-02-06 ENCOUNTER — Encounter: Payer: Self-pay | Admitting: *Deleted

## 2023-02-13 ENCOUNTER — Ambulatory Visit (INDEPENDENT_AMBULATORY_CARE_PROVIDER_SITE_OTHER): Payer: Medicaid Other | Admitting: *Deleted

## 2023-02-13 ENCOUNTER — Other Ambulatory Visit (HOSPITAL_COMMUNITY)
Admission: RE | Admit: 2023-02-13 | Discharge: 2023-02-13 | Disposition: A | Discharge status: Home / Self Care | Payer: Medicaid Other | Source: Ambulatory Visit | Attending: Family Medicine | Admitting: Family Medicine

## 2023-02-13 ENCOUNTER — Other Ambulatory Visit: Payer: Self-pay

## 2023-02-13 VITALS — BP 133/74 | HR 74 | Ht 64.0 in | Wt 255.3 lb

## 2023-02-13 DIAGNOSIS — N898 Other specified noninflammatory disorders of vagina: Secondary | ICD-10-CM | POA: Diagnosis not present

## 2023-02-13 NOTE — Progress Notes (Signed)
Pt presents with c/o vaginal itching. She denies discharge or irritation. She was treated recently for sx of vaginal yeast on 01/09/23. She stated that the itching stopped for awhile but then came back. Self swab obtained for wet prep and STI check per pt request. Pt advised she will be notified of test results and treatment indicated if any, via Mychart.  She voiced understanding.

## 2023-02-14 DIAGNOSIS — M791 Myalgia, unspecified site: Secondary | ICD-10-CM | POA: Diagnosis not present

## 2023-02-14 DIAGNOSIS — R0981 Nasal congestion: Secondary | ICD-10-CM | POA: Diagnosis not present

## 2023-02-14 DIAGNOSIS — R051 Acute cough: Secondary | ICD-10-CM | POA: Diagnosis not present

## 2023-02-14 DIAGNOSIS — R519 Headache, unspecified: Secondary | ICD-10-CM | POA: Diagnosis not present

## 2023-02-15 MED ORDER — METRONIDAZOLE 500 MG PO TABS: 500.0000 mg | ORAL_TABLET | Freq: Two times a day (BID) | ORAL | 0 refills | Status: DC

## 2023-02-18 LAB — CERVICOVAGINAL ANCILLARY ONLY
Bacterial Vaginitis (gardnerella): POSITIVE — AB
Candida Glabrata: NEGATIVE
Candida Vaginitis: NEGATIVE
Chlamydia: NEGATIVE
Comment: NEGATIVE
Comment: NEGATIVE
Comment: NEGATIVE
Comment: NEGATIVE
Comment: NEGATIVE
Comment: NORMAL
Neisseria Gonorrhea: NEGATIVE
Trichomonas: NEGATIVE

## 2023-02-27 ENCOUNTER — Ambulatory Visit: Payer: Medicaid Other

## 2023-03-06 ENCOUNTER — Ambulatory Visit: Payer: Medicaid Other

## 2023-03-19 DIAGNOSIS — M7989 Other specified soft tissue disorders: Secondary | ICD-10-CM | POA: Diagnosis not present

## 2023-04-02 DIAGNOSIS — R5383 Other fatigue: Secondary | ICD-10-CM | POA: Diagnosis not present

## 2023-04-02 DIAGNOSIS — R110 Nausea: Secondary | ICD-10-CM | POA: Diagnosis not present

## 2023-04-05 ENCOUNTER — Telehealth: Payer: Self-pay | Admitting: Family Medicine

## 2023-04-05 NOTE — Telephone Encounter (Signed)
Patient sent a message via mychart regarding Covid testing and Preg. Test. Informed patient we have no open slots until after thanksgiving for nurse visits. Advised her to go to an urgent care.

## 2023-04-09 ENCOUNTER — Encounter (HOSPITAL_BASED_OUTPATIENT_CLINIC_OR_DEPARTMENT_OTHER): Payer: Self-pay | Admitting: Urology

## 2023-04-09 ENCOUNTER — Emergency Department (HOSPITAL_BASED_OUTPATIENT_CLINIC_OR_DEPARTMENT_OTHER)
Admission: EM | Admit: 2023-04-09 | Discharge: 2023-04-09 | Disposition: A | Discharge status: Home / Self Care | Payer: Medicaid Other | Attending: Emergency Medicine | Admitting: Emergency Medicine

## 2023-04-09 ENCOUNTER — Other Ambulatory Visit: Payer: Self-pay

## 2023-04-09 ENCOUNTER — Emergency Department (HOSPITAL_BASED_OUTPATIENT_CLINIC_OR_DEPARTMENT_OTHER): Payer: Medicaid Other

## 2023-04-09 DIAGNOSIS — R519 Headache, unspecified: Secondary | ICD-10-CM | POA: Insufficient documentation

## 2023-04-09 DIAGNOSIS — R110 Nausea: Secondary | ICD-10-CM | POA: Insufficient documentation

## 2023-04-09 DIAGNOSIS — Z20822 Contact with and (suspected) exposure to covid-19: Secondary | ICD-10-CM | POA: Diagnosis not present

## 2023-04-09 LAB — CBC WITH DIFFERENTIAL/PLATELET
Abs Immature Granulocytes: 0.02 10*3/uL (ref 0.00–0.07)
Basophils Absolute: 0 10*3/uL (ref 0.0–0.1)
Basophils Relative: 1 %
Eosinophils Absolute: 0.1 10*3/uL (ref 0.0–0.5)
Eosinophils Relative: 2 %
HCT: 39.4 % (ref 36.0–46.0)
Hemoglobin: 12.5 g/dL (ref 12.0–15.0)
Immature Granulocytes: 0 %
Lymphocytes Relative: 31 %
Lymphs Abs: 1.6 10*3/uL (ref 0.7–4.0)
MCH: 26.8 pg (ref 26.0–34.0)
MCHC: 31.7 g/dL (ref 30.0–36.0)
MCV: 84.4 fL (ref 80.0–100.0)
Monocytes Absolute: 0.5 10*3/uL (ref 0.1–1.0)
Monocytes Relative: 10 %
Neutro Abs: 2.8 10*3/uL (ref 1.7–7.7)
Neutrophils Relative %: 56 %
Platelets: 338 10*3/uL (ref 150–400)
RBC: 4.67 MIL/uL (ref 3.87–5.11)
RDW: 16 % — ABNORMAL HIGH (ref 11.5–15.5)
WBC: 5 10*3/uL (ref 4.0–10.5)
nRBC: 0 % (ref 0.0–0.2)

## 2023-04-09 LAB — BASIC METABOLIC PANEL
Anion gap: 7 (ref 5–15)
BUN: 17 mg/dL (ref 6–20)
CO2: 27 mmol/L (ref 22–32)
Calcium: 9.8 mg/dL (ref 8.9–10.3)
Chloride: 101 mmol/L (ref 98–111)
Creatinine, Ser: 0.82 mg/dL (ref 0.44–1.00)
GFR, Estimated: >60 mL/min (ref >60)
Glucose, Bld: 94 mg/dL (ref 70–99)
Potassium: 4.3 mmol/L (ref 3.5–5.1)
Sodium: 135 mmol/L (ref 135–145)

## 2023-04-09 LAB — RESP PANEL BY RT-PCR (RSV, FLU A&B, COVID)  RVPGX2
Influenza A by PCR: NEGATIVE
Influenza B by PCR: NEGATIVE
Resp Syncytial Virus by PCR: NEGATIVE
SARS Coronavirus 2 by RT PCR: NEGATIVE

## 2023-04-09 MED ORDER — SODIUM CHLORIDE 0.9 % IV BOLUS
1000.0000 mL | Freq: Once | INTRAVENOUS | Status: AC
  Administered 2023-04-09: 1000 mL via INTRAVENOUS

## 2023-04-09 MED ORDER — PROCHLORPERAZINE EDISYLATE 10 MG/2ML IJ SOLN
10.0000 mg | Freq: Once | INTRAMUSCULAR | Status: AC
  Administered 2023-04-09: 10 mg via INTRAVENOUS
  Filled 2023-04-09: qty 2

## 2023-04-09 MED ORDER — DIPHENHYDRAMINE HCL 50 MG/ML IJ SOLN: 12.5000 mg | Freq: Once | INTRAMUSCULAR | Status: DC

## 2023-04-09 MED ORDER — KETOROLAC TROMETHAMINE 15 MG/ML IJ SOLN
15.0000 mg | Freq: Once | INTRAMUSCULAR | Status: AC
  Administered 2023-04-09: 15 mg via INTRAVENOUS
  Filled 2023-04-09: qty 1

## 2023-04-09 NOTE — Discharge Instructions (Signed)
Your headache significantly improved after medications.  CT head did not show any concerning findings.  Blood work was reassuring.  Follow-up with your primary care provider.  For any concerning symptoms return to the emergency room.

## 2023-04-09 NOTE — ED Provider Notes (Signed)
[place] [facility] Provider Note   CSN: [id number] Arrival date & time: 04/09/23  1555     History  Chief Complaint  Patient presents with   Flu like symptoms     Melissa [name] is a 47 y.o. female.  47 year old female presents today for concern of headache with occasional nausea but no episode of emesis.  Headaches been intermittent for the past couple weeks.  This was a gradual onset.  Not sudden onset and not the worst headache of her life.  Denies history of migraines but does endorses occasional headaches.  Denies any significant health problems.  Not on any daily medications.  The history is provided by the patient. No language interpreter was used.       Home Medications Prior to Admission medications   Medication Sig Start Date End Date Taking? Authorizing Provider  famotidine (PEPCID) 20 MG tablet Take 1 tablet (20 mg total) by mouth 2 (two) times daily. Patient not taking: Reported on 12/14/2022 09/12/22   Ernie Avena, MD  ibuprofen (ADVIL) 600 MG tablet Take 1 tablet (600 mg total) by mouth every 6 (six) hours as needed for moderate pain or cramping. Patient not taking: Reported on 12/14/2022 09/04/22   Warden Fillers, MD  Lidocaine HCl 2 % CREA Apply 1 Application topically 2 (two) times daily. Patient not taking: Reported on 12/14/2022 03/06/22   Cristopher Peru, PA-C  loratadine-pseudoephedrine (CLARITIN-D 12-HOUR) 5-120 MG tablet Take 1 tablet by mouth 2 (two) times daily.    [provider]  metroNIDAZOLE (FLAGYL) 500 MG tablet Take 1 tablet (500 mg total) by mouth 2 (two) times daily. 02/15/23   Warden Fillers, MD  ondansetron (ZOFRAN) 4 MG tablet Take 1 tablet (4 mg total) by mouth every 8 (eight) hours as needed for nausea or vomiting. 12/14/22   Warden Fillers, MD  OVER THE COUNTER MEDICATION Vital protein collagen gummies 4 per day Patient not taking: Reported on 12/14/2022    [provider]  pantoprazole  (PROTONIX) 40 MG tablet Take 1 tablet (40 mg total) by mouth daily. 09/12/22 10/12/22  Ernie Avena, MD  promethazine (PHENERGAN) 25 MG tablet Take 1 tablet (25 mg total) by mouth every 6 (six) hours as needed for nausea or vomiting. 09/07/22   Marny Lowenstein, PA-C  tranexamic acid (LYSTEDA) 650 MG TABS tablet Take 2 tablets (1,300 mg total) by mouth 3 (three) times daily. Take during menses for a maximum of five days 12/14/22   Warden Fillers, MD  valACYclovir (VALTREX) 1000 MG tablet Take 1 tablet (1,000 mg total) by mouth 3 (three) times daily. Patient not taking: Reported on 12/14/2022 11/08/22   Rosezella Rumpf, PA-C  diphenhydrAMINE (BENADRYL) 12.5 MG/5ML liquid Take 12.5 mg by mouth once.  08/01/11  [provider]      Allergies    [place] scolymus (artichoke)] and Prednisone    Review of Systems   Review of Systems  Constitutional:  Negative for activity change, chills and fever.  Eyes:  Negative for photophobia and visual disturbance.  Respiratory:  Negative for shortness of breath.   Cardiovascular:  Negative for chest pain.  Musculoskeletal:  Negative for neck pain.  Neurological:  Positive for headaches. Negative for dizziness, speech difficulty, weakness, light-headedness and numbness.  All other systems reviewed and are negative.   Physical Exam Updated Vital Signs BP 133/87 (BP Location: Left Arm)   Pulse 75   Temp 97.7 F (36.5  C)   Resp 20   Ht 5\' 4"  (1.626 m)   Wt 115.2 kg   SpO2 100%   BMI 43.60 kg/m  Physical Exam Vitals and nursing note reviewed.  Constitutional:      General: She is not in acute distress.    Appearance: Normal appearance. She is not ill-appearing.  HENT:     Head: Normocephalic and atraumatic.     Nose: Nose normal.  Eyes:     Conjunctiva/sclera: Conjunctivae normal.  Cardiovascular:     Rate and Rhythm: Normal rate and regular rhythm.     Heart sounds: Normal heart sounds.  Pulmonary:     Effort: Pulmonary effort  is normal. No respiratory distress.  Abdominal:     General: There is no distension.     Palpations: Abdomen is soft.     Tenderness: There is no abdominal tenderness. There is no guarding.  Musculoskeletal:        General: No deformity. Normal range of motion.  Skin:    Findings: No rash.  Neurological:     General: No focal deficit present.     Mental Status: She is alert and oriented to person, place, and time. Mental status is at baseline.     Cranial Nerves: No cranial nerve deficit.     Sensory: No sensory deficit.     Motor: No weakness.     ED Results / Procedures / Treatments   Labs (all labs ordered are listed, but only abnormal results are displayed) Labs Reviewed  RESP PANEL BY RT-PCR (RSV, FLU A&B, COVID)  RVPGX2  CBC WITH DIFFERENTIAL/PLATELET  BASIC METABOLIC PANEL    EKG None  Radiology No results found.  Procedures Procedures    Medications Ordered in ED Medications  sodium chloride 0.9 % bolus 1,000 mL (has no administration in time range)  ketorolac (TORADOL) 15 MG/ML injection 15 mg (has no administration in time range)  prochlorperazine (COMPAZINE) injection 10 mg (has no administration in time range)  diphenhydrAMINE (BENADRYL) injection 12.5 mg (has no administration in time range)    ED Course/ Medical Decision Making/ A&P                                 Medical Decision Making Amount and/or Complexity of Data Reviewed Labs: ordered. Radiology: ordered.  Risk Prescription drug management.   Medical Decision Making / ED Course   This patient presents to the ED for concern of headache, this involves an extensive number of treatment options, and is a complaint that carries with it a high risk of complications and morbidity.  The differential diagnosis includes migraine headache, tension headache, cluster headache, viral URI  MDM: 47 year old female presents today for concern of headache with associated nausea but no vomiting.  No  prior history of headaches.  Ongoing for 2 weeks.  Not severe in severity or sudden in onset.  Will provide migraine cocktail, fluid bolus, and obtain CT head.  Headache resolved after migraine cocktail.  CT head pending.  CBC without acute concern.  There is a lab issue and chemistry panels are being sent over to a different hospital to be ran.  Prior to CT resulting patient states she has to go pick up her 39-year-old daughter and cannot wait any longer.  She understand that the test results are not back and this could show something concerning that she would require immediate evaluation and treatment for.  Despite  that she would like to leave prior to the results.  Patient was in stable condition at the time of discharge.   Lab Tests: -I ordered, reviewed, and interpreted labs.   The pertinent results include:   Labs Reviewed  RESP PANEL BY RT-PCR (RSV, FLU A&B, COVID)  RVPGX2  CBC WITH DIFFERENTIAL/PLATELET  BASIC METABOLIC PANEL      EKG  EKG Interpretation Date/Time:    Ventricular Rate:    PR Interval:    QRS Duration:    QT Interval:    QTC Calculation:   R Axis:      Text Interpretation:           Imaging Studies ordered: I ordered imaging studies including ct head I independently visualized and interpreted imaging. I agree with the radiologist interpretation   Medicines ordered and prescription drug management: Meds ordered this encounter  Medications   sodium chloride 0.9 % bolus 1,000 mL   ketorolac (TORADOL) 15 MG/ML injection 15 mg   prochlorperazine (COMPAZINE) injection 10 mg   diphenhydrAMINE (BENADRYL) injection 12.5 mg    -I have reviewed the patients home medicines and have made adjustments as needed  Reevaluation: After the interventions noted above, I reevaluated the patient and found that they have :improved  Co morbidities that complicate the patient evaluation  Past Medical History:  Diagnosis Date   Abnormal uterine bleeding (AUB)     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  She will return for any concerning symptoms.  She will follow-up with PCP.  She will follow-up on results on MyChart.   Final Clinical Impression(s) / ED Diagnoses Final diagnoses:  Bad headache    Rx / DC Orders ED Discharge Orders     None         Marita Kansas, PA-C 04/09/23 1903    Maia Plan, MD 04/10/23 [phone]

## 2023-04-09 NOTE — ED Triage Notes (Signed)
Pt states HA, nausea, chills, fatigue X 2 weeks States headache comes and goes    Son had covid

## 2023-05-06 DIAGNOSIS — J019 Acute sinusitis, unspecified: Secondary | ICD-10-CM | POA: Diagnosis not present

## 2023-05-06 DIAGNOSIS — H6641 Suppurative otitis media, unspecified, right ear: Secondary | ICD-10-CM | POA: Diagnosis not present

## 2023-05-06 DIAGNOSIS — R0981 Nasal congestion: Secondary | ICD-10-CM | POA: Diagnosis not present

## 2023-05-29 NOTE — Progress Notes (Signed)
GYNECOLOGY  VISIT   HPI: Melissa [name] is a 48 y.o. 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 Cycles are now regular, but periods come 4-5 days after expected day each month. Patient states she's having heavy periods, saturating full sized pad every 3 hours. She and her partner have been trying to conceive since 2022 with pregnancy loss 08/2021. She has three children, and her partner has one child separately, with none together.   She denies pain, lightheadedness, dizziness, syncope, dysmenorrhea, urinary/bowel sxs  Notes multiple psychosocial stressors and financial barriers to securing housing.   GYNECOLOGIC HISTORY: Patient's last menstrual period was 05/28/2023 (exact date). Contraception: none.   Last mammogram: 09/24/22, no abnormalities Last pap smear:  Diagnosis  Date Value Ref Range Status  08/29/2022   Final   - Negative for intraepithelial lesion or malignancy (NILM)           OB History     Gravida  6   Para  3   Term  3   Preterm  0   AB  3   Living  3      SAB  2   IAB  0   Ectopic  1   Multiple  0   Live Births  3              Patient Active Problem List   Diagnosis Date Noted   Excessive vaginal bleeding 09/04/2022   Intramural, submucous, and subserous leiomyoma of uterus 09/04/2022   Pelvic pain in female 05/31/2022   DUB (dysfunctional uterine bleeding) 12/29/2021   Grief reaction 12/06/2021   Encounter for fertility planning 03/21/2021   S/P appendectomy 10/26/2020   External hemorrhoids 07/19/2020   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   Mixed conductive and sensorineural hearing loss of right ear with restricted hearing of left ear 08/10/2016   Uterine fibroids, antepartum condition or complication 02/06/2012    Fibroids 08/30/2011   Obesity 12/21/2010    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, f/u yearly at [facility] 08-06-2022 stephanie thornton np epic   BV (bacterial vaginosis)    recurring   Chlamydia    Ectopic pregnancy 06/2011   Fibroid    Infertility    Secondary PCOS   PCOS (polycystic ovarian syndrome)    Shingles outbreak 11/2011    Past Surgical History:  Procedure Laterality Date   APPENDECTOMY  2022   DILATION AND CURETTAGE OF UTERUS  06/2011   ectopic   ear tube placement x 3     as child   EAR TUBE REMOVAL     as child   ECTOPIC PREGNANCY SURGERY  2013   LAPAROSCOPY  07/05/2011   Procedure: LAPAROSCOPY OPERATIVE;  Surgeon: Tereso Newcomer, MD;  Location: [facility];  Service: Gynecology;  Laterality: N/A;   right ear surgery Right 2011   benign tumor removed   WISDOM TOOTH EXTRACTION      Current Outpatient Medications  Medication Sig Dispense Refill   loratadine-pseudoephedrine (CLARITIN-D 12-HOUR) 5-120 MG tablet Take 1 tablet by mouth 2 (two) times daily.     ibuprofen (ADVIL) 600 MG tablet Take 1 tablet (600 mg total) by mouth every  6 (six) hours as needed for moderate pain or cramping. (Patient not taking: Reported on 05/30/2023) 30 tablet 2   ondansetron (ZOFRAN) 4 MG tablet Take 1 tablet (4 mg total) by mouth every 8 (eight) hours as needed for nausea or vomiting. (Patient not taking: Reported on 05/30/2023) 20 tablet 2   OVER THE COUNTER MEDICATION Vital protein collagen gummies 4 per day (Patient not taking: Reported on 12/14/2022)     pantoprazole (PROTONIX) 40 MG tablet Take 1 tablet (40 mg total) by mouth daily. 30 tablet 0   promethazine (PHENERGAN) 25 MG tablet Take 1 tablet (25 mg total) by mouth every 6 (six) hours as needed for nausea or vomiting. (Patient not taking: Reported on 05/30/2023) 20 tablet 0   tranexamic acid (LYSTEDA) 650 MG  TABS tablet Take 2 tablets (1,300 mg total) by mouth 3 (three) times daily. Take during menses for a maximum of five days (Patient not taking: Reported on 05/30/2023) 30 tablet 4   valACYclovir (VALTREX) 1000 MG tablet Take 1 tablet (1,000 mg total) by mouth 3 (three) times daily. (Patient not taking: Reported on 05/30/2023) 21 tablet 0   No current facility-administered medications for this visit.     ALLERGIES: Artichoke [cynara scolymus (artichoke)] and Prednisone  Family History  Problem Relation Age of Onset   Diabetes Mother    Hypertension Mother    Cancer Father    Fibroids Sister    Hypertension Sister    Crohn's disease Brother    Cancer Brother    Diabetes Brother    Hypertension Brother    Kidney disease Brother    Breast cancer Maternal Aunt    Breast cancer Paternal Aunt    Anesthesia problems Neg Hx     Social History   Socioeconomic History   Marital status: Significant Other    Spouse name: Not on file   Number of children: Not on file   Years of education: Not on file   Highest education level: Not on file  Occupational History   Not on file  Tobacco Use   Smoking status: Never    Passive exposure: Never   Smokeless tobacco: Never  Vaping Use   Vaping status: Never Used  Substance and Sexual Activity   Alcohol use: No   Drug use: No   Sexual activity: Yes    Birth control/protection: None  Other Topics Concern   Not on file  Social History Narrative   As of 10/22/2013    Unemployed   Sexually active with her husband.   Lives with husband, 45 year old daughter, 5 year old son, and 38 month old son.   Occasional exercise.   Never smoker.   Never drug use or alcohol.   Does not feel at risk for STD and does feel safe in relationship.   Social Drivers of Corporate investment banker Strain: Not on file  Food Insecurity: Food Insecurity Present (12/14/2022)   Hunger Vital Sign    Worried About Running Out of Food in the Last Year: Often true     Ran Out of Food in the Last Year: Often true  Transportation Needs: No Transportation Needs (12/14/2022)   PRAPARE - Administrator, Civil Service (Medical): No    Lack of Transportation (Non-Medical): No  Physical Activity: Not on file  Stress: Not on file  Social Connections: Not on file  Intimate Partner Violence: Not on file    Review of Systems  PHYSICAL EXAMINATION:  BP 125/85   Pulse 74   Wt 247 lb (112 kg)   LMP 05/28/2023 (Exact Date)   BMI 42.40 kg/m     General appearance: alert, cooperative and appears stated age Head: Normocephalic, without obvious abnormality, atraumatic Lungs: clear to auscultation bilaterally Breasts: deferred Heart: regular rate and rhythm Abdomen: soft, non-tender, no masses,  no organomegaly Extremities: extremities normal, atraumatic, no cyanosis or edema Skin: Skin color, texture, turgor normal. No rashes or lesions No abnormal inguinal nodes palpated Neurologic: Grossly normal Pelvic: Deferred per patient request Bimanual Exam: Deferred per patient request   ASSESSMENT & PLAN  Melissa [name] is 48 yo [id number] s/p RFA d/t AUB-L (2024) who presents for new uterine mass sensation with increased volume of bleeding and fertility counseling.   1. Intramural, submucous, and subserous leiomyoma of uterus (Primary) - Uterine fibroids: The patient's fibroids are symptomatic and treatment options of expectant management, medical therapy, and surgical therapy were discussed. - Expectant management - The patient's fibroids were discussed and expectant management was offered with strict precautions. - TXA - this medication was discussed as a means to control vaginal bleeding. Discussed it would not impact growth of the fibroids either way. Its main advantage is avoiding hormonal or surgical therapy and gives an option for therapy besides expectant management.  - Following counseling, the patient would like time to consider TXA. - US  Transvaginal Non-OB; Future   2. Encounter for fertility planning -Reviewed fertility goals. Aware that spontaneous pregnancy less likely at her current age and pregnancy at this age would be considered high risk.  -Discussed need for work-up by REI. Offered HSG, AMH; patient politely declines testing at this time due to unknown costs. -Discussed utility of period tracking and at-home ovulation predictor kits.   3. Psychosocial stressors Offered [facility] follow up with Asher Muir - previously established care and cites multiple stressors including housing instability, familial disagreements, financial concerns, infertility, etc.     An After Visit Summary was printed and given to the patient.  Ralene Muskrat, PA-C 1/16/20251:29 PM

## 2023-05-30 ENCOUNTER — Ambulatory Visit: Payer: Medicaid Other | Admitting: Obstetrics and Gynecology

## 2023-05-30 ENCOUNTER — Encounter: Payer: Self-pay | Admitting: Obstetrics and Gynecology

## 2023-05-30 VITALS — BP 125/85 | HR 74 | Wt 247.0 lb

## 2023-05-30 DIAGNOSIS — D250 Submucous leiomyoma of uterus: Secondary | ICD-10-CM | POA: Diagnosis not present

## 2023-05-30 DIAGNOSIS — D251 Intramural leiomyoma of uterus: Secondary | ICD-10-CM

## 2023-05-30 DIAGNOSIS — Z3189 Encounter for other procreative management: Secondary | ICD-10-CM

## 2023-05-30 DIAGNOSIS — D252 Subserosal leiomyoma of uterus: Secondary | ICD-10-CM

## 2023-05-30 DIAGNOSIS — Z658 Other specified problems related to psychosocial circumstances: Secondary | ICD-10-CM

## 2023-05-30 DIAGNOSIS — Z1331 Encounter for screening for depression: Secondary | ICD-10-CM | POA: Diagnosis not present

## 2023-06-10 DIAGNOSIS — B029 Zoster without complications: Secondary | ICD-10-CM | POA: Diagnosis not present

## 2023-06-10 DIAGNOSIS — H9201 Otalgia, right ear: Secondary | ICD-10-CM | POA: Diagnosis not present

## 2023-07-29 DIAGNOSIS — M7751 Other enthesopathy of right foot: Secondary | ICD-10-CM | POA: Diagnosis not present

## 2023-07-29 DIAGNOSIS — M25571 Pain in right ankle and joints of right foot: Secondary | ICD-10-CM | POA: Diagnosis not present

## 2023-07-30 NOTE — Progress Notes (Deleted)
    GYNECOLOGY VISIT  Patient name: Melissa [name] MRN [id number]  Date of birth: 07/22/1975 Chief Complaint:   No chief complaint on file.   History:  Melissa [name] is a 48 y.o. [phone] being seen today for ***.     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, f/u yearly at [facility] 08-06-2022 stephanie thornton np epic   BV (bacterial vaginosis)    recurring   Chlamydia    Ectopic pregnancy 06/2011   Fibroid    Infertility    Secondary PCOS   PCOS (polycystic ovarian syndrome)    Shingles outbreak 11/2011    Past Surgical History:  Procedure Laterality Date   APPENDECTOMY  2022   DILATION AND CURETTAGE OF UTERUS  06/2011   ectopic   ear tube placement x 3     as child   EAR TUBE REMOVAL     as child   ECTOPIC PREGNANCY SURGERY  2013   LAPAROSCOPY  07/05/2011   Procedure: LAPAROSCOPY OPERATIVE;  Surgeon: Tereso Newcomer, MD;  Location: [facility];  Service: Gynecology;  Laterality: N/A;   right ear surgery Right 2011   benign tumor removed   WISDOM TOOTH EXTRACTION      The following portions of the patient's history were reviewed and updated as appropriate: allergies, current medications, past family history, past medical history, past social history, past surgical history and problem list.   Health Maintenance:   Last pap ***. Results were: {Pap findings:25134}. H/O abnormal pap: {yes/yes***/no:23866} Last mammogram: ***. Results were: {normal, abnormal, n/a:23837}. Family h/o breast cancer: {yes***/no:23838}   Review of Systems:  {Ros - complete:30496} Comprehensive review of systems was otherwise negative.   Objective:  Physical Exam There were no vitals taken for this visit.    Physical Exam   Labs and Imaging No results found.     Assessment & Plan:   There are no diagnoses linked to this encounter.   *** Routine preventative health maintenance measures emphasized.  Lorriane Shire, MD Minimally Invasive Gynecologic Surgery [facility] for [facility], [facility]

## 2023-07-31 ENCOUNTER — Ambulatory Visit: Payer: Medicaid Other | Admitting: Obstetrics and Gynecology

## 2023-08-12 DIAGNOSIS — D373 Neoplasm of uncertain behavior of appendix: Secondary | ICD-10-CM | POA: Diagnosis not present

## 2023-09-12 DIAGNOSIS — H6691 Otitis media, unspecified, right ear: Secondary | ICD-10-CM | POA: Diagnosis not present

## 2023-09-19 ENCOUNTER — Encounter: Payer: Self-pay | Admitting: Obstetrics and Gynecology

## 2023-09-19 ENCOUNTER — Emergency Department (HOSPITAL_BASED_OUTPATIENT_CLINIC_OR_DEPARTMENT_OTHER)
Admission: EM | Admit: 2023-09-19 | Discharge: 2023-09-19 | Disposition: A | Discharge status: Home / Self Care | Attending: Emergency Medicine | Admitting: Emergency Medicine

## 2023-09-19 ENCOUNTER — Other Ambulatory Visit: Payer: Self-pay

## 2023-09-19 ENCOUNTER — Encounter (HOSPITAL_BASED_OUTPATIENT_CLINIC_OR_DEPARTMENT_OTHER): Payer: Self-pay | Admitting: Emergency Medicine

## 2023-09-19 ENCOUNTER — Ambulatory Visit: Admitting: Obstetrics and Gynecology

## 2023-09-19 ENCOUNTER — Other Ambulatory Visit (HOSPITAL_BASED_OUTPATIENT_CLINIC_OR_DEPARTMENT_OTHER): Payer: Self-pay

## 2023-09-19 VITALS — BP 118/80 | HR 80 | Wt 247.8 lb

## 2023-09-19 DIAGNOSIS — R059 Cough, unspecified: Secondary | ICD-10-CM | POA: Diagnosis present

## 2023-09-19 DIAGNOSIS — H6691 Otitis media, unspecified, right ear: Secondary | ICD-10-CM | POA: Diagnosis not present

## 2023-09-19 DIAGNOSIS — G8929 Other chronic pain: Secondary | ICD-10-CM | POA: Diagnosis not present

## 2023-09-19 DIAGNOSIS — J040 Acute laryngitis: Secondary | ICD-10-CM | POA: Diagnosis not present

## 2023-09-19 DIAGNOSIS — R110 Nausea: Secondary | ICD-10-CM | POA: Diagnosis not present

## 2023-09-19 DIAGNOSIS — J069 Acute upper respiratory infection, unspecified: Secondary | ICD-10-CM | POA: Insufficient documentation

## 2023-09-19 DIAGNOSIS — R102 Pelvic and perineal pain: Secondary | ICD-10-CM | POA: Diagnosis not present

## 2023-09-19 DIAGNOSIS — R051 Acute cough: Secondary | ICD-10-CM | POA: Diagnosis not present

## 2023-09-19 DIAGNOSIS — J019 Acute sinusitis, unspecified: Secondary | ICD-10-CM | POA: Diagnosis not present

## 2023-09-19 MED ORDER — PREDNISONE 20 MG PO TABS
20.0000 mg | ORAL_TABLET | Freq: Every day | ORAL | 0 refills | Status: AC
  Filled 2023-09-19: qty 5, 5d supply, fill #0

## 2023-09-19 MED ORDER — BACLOFEN 10 MG PO TABS
10.0000 mg | ORAL_TABLET | Freq: Two times a day (BID) | ORAL | 1 refills | Status: AC | PRN
Start: 2023-09-19 — End: ?

## 2023-09-19 MED ORDER — CLINDAMYCIN HCL 300 MG PO CAPS
300.0000 mg | ORAL_CAPSULE | Freq: Three times a day (TID) | ORAL | 0 refills | Status: AC
  Filled 2023-09-19: qty 15, 5d supply, fill #0

## 2023-09-19 NOTE — ED Provider Notes (Signed)
 [facility] Provider Note   CSN: [id number] Arrival date & time: 09/19/23  1016     History  Chief Complaint  Patient presents with   Facial Pain    Melissa [name] is a 48 y.o. female.  Patient here with cough ear pain harsh sounding voice.  Finished antibiotics for ear infection.  But having more postnasal drip.  Has nasal congestion.  Denies any difficulty eating or drinking.  No difficulty opening closing mouth.  Denies any chest pain shortness of breath weakness numbness tingling.  History of fibroids.  The history is provided by the patient.       Home Medications Prior to Admission medications   Medication Sig Start Date End Date Taking? Authorizing Provider  clindamycin (CLEOCIN) 300 MG capsule Take 1 capsule (300 mg total) by mouth 3 (three) times daily for 5 days. 09/19/23 09/24/23 Yes Elnore Cosens, DO  predniSONE  (DELTASONE ) 20 MG tablet Take 1 tablet (20 mg total) by mouth daily for 5 days. 09/19/23 09/24/23 Yes Avni Traore, DO  baclofen (LIORESAL) 10 MG tablet Take 1 tablet (10 mg total) by mouth 2 (two) times daily as needed for muscle spasms. 09/19/23   Ajewole, Christana, MD  ibuprofen  (ADVIL ) 600 MG tablet Take 1 tablet (600 mg total) by mouth every 6 (six) hours as needed for moderate pain or cramping. Patient not taking: Reported on 12/14/2022 09/04/22   Abigail Abler, MD  loratadine-pseudoephedrine (CLARITIN-D 12-HOUR) 5-120 MG tablet Take 1 tablet by mouth 2 (two) times daily. Patient not taking: Reported on 09/19/2023    [provider]  ondansetron  (ZOFRAN ) 4 MG tablet Take 1 tablet (4 mg total) by mouth every 8 (eight) hours as needed for nausea or vomiting. Patient not taking: Reported on 09/19/2023 12/14/22   Abigail Abler, MD  OVER THE COUNTER MEDICATION Vital protein collagen gummies 4 per day Patient not taking: Reported on 12/14/2022    [provider]  pantoprazole  (PROTONIX ) 40 MG tablet Take 1  tablet (40 mg total) by mouth daily. 09/12/22 10/12/22  Rosealee Concha, MD  promethazine  (PHENERGAN ) 25 MG tablet Take 1 tablet (25 mg total) by mouth every 6 (six) hours as needed for nausea or vomiting. Patient not taking: Reported on 09/19/2023 09/07/22   Millard Alliance, PA-C  tranexamic acid  (LYSTEDA ) 650 MG TABS tablet Take 2 tablets (1,300 mg total) by mouth 3 (three) times daily. Take during menses for a maximum of five days Patient not taking: Reported on 09/19/2023 12/14/22   Abigail Abler, MD  valACYclovir  (VALTREX ) 1000 MG tablet Take 1 tablet (1,000 mg total) by mouth 3 (three) times daily. Patient not taking: Reported on 12/14/2022 11/08/22   Kehrli, Kelsey F, PA-C  diphenhydrAMINE  (BENADRYL ) 12.5 MG/5ML liquid Take 12.5 mg by mouth once.  08/01/11  [provider]      Allergies    Marigene Shoulder scolymus (artichoke)]    Review of Systems   Review of Systems  Physical Exam Updated Vital Signs BP 137/88 (BP Location: Left Arm)   Pulse 70   Temp 97.6 F (36.4 C) (Oral)   Resp 16   LMP 09/12/2023   SpO2 100%  Physical Exam Vitals and nursing note reviewed.  Constitutional:      General: Melissa [name] is not in acute distress.    Appearance: Melissa [name] is well-developed.  HENT:     Head: Normocephalic and atraumatic.     Right Ear: Tympanic membrane normal.  Left Ear: Tympanic membrane normal.     Nose: Congestion present.     Mouth/Throat:     Pharynx: No oropharyngeal exudate or posterior oropharyngeal erythema.  Eyes:     Extraocular Movements: Extraocular movements intact.     Conjunctiva/sclera: Conjunctivae normal.     Pupils: Pupils are equal, round, and reactive to light.  Cardiovascular:     Rate and Rhythm: Normal rate and regular rhythm.     Pulses: Normal pulses.     Heart sounds: Normal heart sounds. No murmur heard. Pulmonary:     Effort: Pulmonary effort is normal. No respiratory distress.     Breath sounds: Normal breath sounds.  Abdominal:      Palpations: Abdomen is soft.     Tenderness: There is no abdominal tenderness.  Musculoskeletal:        General: No swelling.     Cervical back: Normal range of motion and neck supple.  Skin:    General: Skin is warm and dry.     Capillary Refill: Capillary refill takes less than 2 seconds.  Neurological:     Mental Status: Melissa [name] is alert.  Psychiatric:        Mood and Affect: Mood normal.     ED Results / Procedures / Treatments   Labs (all labs ordered are listed, but only abnormal results are displayed) Labs Reviewed - No data to display  EKG None  Radiology No results found.  Procedures Procedures    Medications Ordered in ED Medications - No data to display  ED Course/ Medical Decision Making/ A&P                                 Medical Decision Making Risk Prescription drug management.   Natavia Groth is here with URI symptoms.  Melissa [name] has hoarse voice on exam.  Differential likely laryngitis viral process I have no concern for strep throat or ear infection at this time but will treat with clindamycin and prednisone .  Melissa [name] does not have an allergy to prednisone  despite this being in her chart.  This was removed.  Overall I think Melissa [name] probably has laryngitis will benefit from steroids we will do an antibiotic in case there is some sort of throat infection as well.  Melissa [name] understands return precautions.  Discharged in good condition.  No concern for other emergent process.  This chart was dictated using voice recognition software.  Despite best efforts to proofread,  errors can occur which can change the documentation meaning.         Final Clinical Impression(s) / ED Diagnoses Final diagnoses:  Viral URI with cough    Rx / DC Orders ED Discharge Orders          Ordered    predniSONE  (DELTASONE ) 20 MG tablet  Daily        09/19/23 1144    clindamycin (CLEOCIN) 300 MG capsule  3 times daily        09/19/23 1144              Tarnisha Kachmar,  DO 09/19/23 1146

## 2023-09-19 NOTE — Progress Notes (Signed)
 GYNECOLOGY VISIT  Patient name: Melissa [name] MRN [id number]  Date of birth: 04-28-1976 Chief Complaint:   Gynecologic Exam  History:  Melissa [name] is a 48 y.o. [id number] being seen today for pelvic pain.    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 Less bulk symptoms after sonata  but feeling like "something is in there" during penetration. Regular cycles, heavy and trying to conceive. Declined HSG and AMH due to concern for cost.   Discussed the use of AI scribe software for clinical note transcription with the patient, who gave verbal consent to proceed.  History of Present Illness Melissa [name] is a 48 year old female with fibroids who presents with pelvic pain and abnormal uterine bleeding.  She experiences pelvic pressure and extensive bleeding following a previous Sonata . Initially, the bleeding improved post-surgery, but then became heavier than normal and was accompanied by pain and significant clotting, described as the worst she has ever experienced. Over time, the bleeding has improved, with no more heavy bleeding or clotting, but she continues to experience pelvic pain.  She has not tried TXA due to concerns about potential interactions with acyclovir , which she was taking for shingles, and other medications including allergy medicine. She mentions that 'cocktails don't work good with me' and attributes the lack of trying TXA to this concern. Currently, the bleeding has improved significantly.  The pelvic pain is suspected to be related to a fibroid that was not treated during her previous surgery. She recalls being informed that there were four fibroids, but only three were addressed. She believes the remaining fibroid is causing pain, particularly on the left side, where she anticipates discomfort during a pelvic exam.  She cannot use ibuprofen  due to constipation issues and finds Tylenol   ineffective, describing it as 'like you're taking candy'. She has taken muscle relaxers in the past, prior to having children, and would consider them again, but only on her days off.  No current heavy bleeding or clotting -though symptoms have improved without medication.  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, f/u yearly at [facility] 08-06-2022 stephanie thornton np epic   BV (bacterial vaginosis)    recurring   Chlamydia    Ectopic pregnancy 06/2011   Fibroid    Infertility    Secondary PCOS   PCOS (polycystic ovarian syndrome)    Shingles outbreak 11/2011    Past Surgical History:  Procedure Laterality Date   APPENDECTOMY  2022   DILATION AND CURETTAGE OF UTERUS  06/2011   ectopic   ear tube placement x 3     as child   EAR TUBE REMOVAL     as child   ECTOPIC PREGNANCY SURGERY  2013   LAPAROSCOPY  07/05/2011   Procedure: LAPAROSCOPY OPERATIVE;  Surgeon: Julianne Octave, MD;  Location: [facility];  Service: Gynecology;  Laterality: N/A;   right ear surgery Right 2011   benign tumor removed   WISDOM TOOTH EXTRACTION      The following portions of the patient's history were reviewed and updated as appropriate: allergies, current medications, past family history, past medical history, past social history, past surgical history and problem list.   Health Maintenance:   Last pap     Component Value Date/Time   DIAGPAP  08/29/2022 1343    -  Negative for intraepithelial lesion or malignancy (NILM)   DIAGPAP  03/16/2019 1105    - Negative for intraepithelial lesion or malignancy (NILM)   HPVHIGH Negative 08/29/2022 1343   HPVHIGH Negative 03/16/2019 1105   ADEQPAP  08/29/2022 1343    Satisfactory for evaluation; transformation zone component PRESENT.   ADEQPAP  03/16/2019 1105    Satisfactory for evaluation; transformation zone component PRESENT.    High  Risk HPV: Positive  Adequacy:  Satisfactory for evaluation, transformation zone component PRESENT  Diagnosis:  Atypical squamous cells of undetermined significance (ASC-US )  Last mammogram: 09/2022 BIRADS 1   Review of Systems:  Pertinent items are noted in HPI. Comprehensive review of systems was otherwise negative.   Objective:  Physical Exam BP 118/80   Pulse 80   Wt 247 lb 12.8 oz (112.4 kg)   LMP 09/12/2023   BMI 42.53 kg/m    Physical Exam Vitals and nursing note reviewed. Exam conducted with a chaperone present.  Constitutional:      Appearance: Normal appearance.  HENT:     Head: Normocephalic and atraumatic.  Pulmonary:     Effort: Pulmonary effort is normal.     Breath sounds: Normal breath sounds.  Abdominal:     Comments: No allodynia Negative carnett test  Well healed incisions without allodynia  Genitourinary:    General: Normal vulva.     Exam position: Lithotomy position.     Vagina: Normal.     Cervix: Normal.     Comments: Normal appearing vulva Normal vulvar sensation bilaterally Nontender superficial pelvic floor muscles Nontender ischial tuberosities bilaterally  Allodynia at introitus: No  Anal wink not present Posterior vaginal wall tender Right levator ani 7/10 Right ischiococcygeous 6/10 Right obturator internus 1/10 Left levator ani 10/10 Left ischioccocygeous 9/10 Left obturator internus 10/10 Deep levator ani tenderness bilaterally  Uterus non-tender   Skin:    General: Skin is warm and dry.  Neurological:     General: No focal deficit present.     Mental Status: She is alert.  Psychiatric:        Mood and Affect: Mood normal.        Behavior: Behavior normal.        Thought Content: Thought content normal.        Judgment: Judgment normal.      Assessment & Plan:   Assessment & Plan Uterine fibroids and Pelvic Pain Uterine fibroids with previous surgery. Current pelvic pain may be due to residual fibroid. Bleeding  improved, suggesting fibroid size reduction. Consider pelvic floor dysfunction from fibroid pressure. - Order pelvic ultrasound to assess fibroid status. - Pelvic exam demonstrates pelvic myalgia, left worse than right -Recommend trial of muscle relaxer noting the possible side effect of drowsiness and to take on either days off or before going to bed. - Recommended excepted referral to PFPT; discussed that PT is typically a weekly event but if time and/or cost are prohibitive, can discuss with pelvic floor physical therapist for HEP for the time in between sessions - Will follow-up results of ultrasound   Routine preventative health maintenance measures emphasized.  Kiki Pelton, MD Minimally Invasive Gynecologic Surgery Center for [facility], [facility]

## 2023-09-19 NOTE — ED Triage Notes (Signed)
 Sinus pressure and ear infection x 1 week , completed her prescribed antibiotic with no relief . Reports persistent drainage from ear down her throat .

## 2023-09-20 ENCOUNTER — Ambulatory Visit (HOSPITAL_COMMUNITY)
Admission: RE | Admit: 2023-09-20 | Discharge: 2023-09-20 | Disposition: A | Discharge status: Home / Self Care | Source: Ambulatory Visit | Attending: Obstetrics and Gynecology | Admitting: Obstetrics and Gynecology

## 2023-09-20 DIAGNOSIS — D252 Subserosal leiomyoma of uterus: Secondary | ICD-10-CM | POA: Diagnosis not present

## 2023-09-20 DIAGNOSIS — D250 Submucous leiomyoma of uterus: Secondary | ICD-10-CM | POA: Diagnosis not present

## 2023-09-20 DIAGNOSIS — D251 Intramural leiomyoma of uterus: Secondary | ICD-10-CM | POA: Diagnosis not present

## 2023-09-27 ENCOUNTER — Ambulatory Visit: Payer: Self-pay | Admitting: Obstetrics and Gynecology

## 2023-10-22 DIAGNOSIS — F331 Major depressive disorder, recurrent, moderate: Secondary | ICD-10-CM | POA: Diagnosis not present

## 2023-10-22 DIAGNOSIS — F411 Generalized anxiety disorder: Secondary | ICD-10-CM | POA: Diagnosis not present

## 2023-10-24 DIAGNOSIS — F331 Major depressive disorder, recurrent, moderate: Secondary | ICD-10-CM | POA: Diagnosis not present

## 2023-10-24 DIAGNOSIS — F411 Generalized anxiety disorder: Secondary | ICD-10-CM | POA: Diagnosis not present

## 2023-10-29 DIAGNOSIS — F331 Major depressive disorder, recurrent, moderate: Secondary | ICD-10-CM | POA: Diagnosis not present

## 2023-10-29 DIAGNOSIS — F411 Generalized anxiety disorder: Secondary | ICD-10-CM | POA: Diagnosis not present

## 2023-10-30 DIAGNOSIS — F331 Major depressive disorder, recurrent, moderate: Secondary | ICD-10-CM | POA: Diagnosis not present

## 2023-10-30 DIAGNOSIS — F411 Generalized anxiety disorder: Secondary | ICD-10-CM | POA: Diagnosis not present

## 2023-10-31 DIAGNOSIS — F331 Major depressive disorder, recurrent, moderate: Secondary | ICD-10-CM | POA: Diagnosis not present

## 2023-10-31 DIAGNOSIS — F411 Generalized anxiety disorder: Secondary | ICD-10-CM | POA: Diagnosis not present

## 2023-11-04 DIAGNOSIS — F411 Generalized anxiety disorder: Secondary | ICD-10-CM | POA: Diagnosis not present

## 2023-11-04 DIAGNOSIS — F331 Major depressive disorder, recurrent, moderate: Secondary | ICD-10-CM | POA: Diagnosis not present

## 2023-11-06 DIAGNOSIS — F331 Major depressive disorder, recurrent, moderate: Secondary | ICD-10-CM | POA: Diagnosis not present

## 2023-11-06 DIAGNOSIS — F411 Generalized anxiety disorder: Secondary | ICD-10-CM | POA: Diagnosis not present

## 2023-11-07 DIAGNOSIS — F411 Generalized anxiety disorder: Secondary | ICD-10-CM | POA: Diagnosis not present

## 2023-11-07 DIAGNOSIS — F331 Major depressive disorder, recurrent, moderate: Secondary | ICD-10-CM | POA: Diagnosis not present

## 2023-11-11 DIAGNOSIS — F331 Major depressive disorder, recurrent, moderate: Secondary | ICD-10-CM | POA: Diagnosis not present

## 2023-11-11 DIAGNOSIS — F411 Generalized anxiety disorder: Secondary | ICD-10-CM | POA: Diagnosis not present

## 2023-11-13 DIAGNOSIS — F331 Major depressive disorder, recurrent, moderate: Secondary | ICD-10-CM | POA: Diagnosis not present

## 2023-11-13 DIAGNOSIS — F411 Generalized anxiety disorder: Secondary | ICD-10-CM | POA: Diagnosis not present

## 2023-11-19 DIAGNOSIS — F331 Major depressive disorder, recurrent, moderate: Secondary | ICD-10-CM | POA: Diagnosis not present

## 2023-11-19 DIAGNOSIS — F411 Generalized anxiety disorder: Secondary | ICD-10-CM | POA: Diagnosis not present

## 2023-11-25 ENCOUNTER — Other Ambulatory Visit: Payer: Self-pay | Admitting: Family Medicine

## 2023-11-25 DIAGNOSIS — Z1231 Encounter for screening mammogram for malignant neoplasm of breast: Secondary | ICD-10-CM

## 2023-11-26 DIAGNOSIS — F331 Major depressive disorder, recurrent, moderate: Secondary | ICD-10-CM | POA: Diagnosis not present

## 2023-11-26 DIAGNOSIS — F411 Generalized anxiety disorder: Secondary | ICD-10-CM | POA: Diagnosis not present

## 2023-11-29 DIAGNOSIS — F411 Generalized anxiety disorder: Secondary | ICD-10-CM | POA: Diagnosis not present

## 2023-11-29 DIAGNOSIS — F331 Major depressive disorder, recurrent, moderate: Secondary | ICD-10-CM | POA: Diagnosis not present

## 2023-11-30 DIAGNOSIS — F331 Major depressive disorder, recurrent, moderate: Secondary | ICD-10-CM | POA: Diagnosis not present

## 2023-11-30 DIAGNOSIS — F411 Generalized anxiety disorder: Secondary | ICD-10-CM | POA: Diagnosis not present

## 2023-12-03 DIAGNOSIS — F331 Major depressive disorder, recurrent, moderate: Secondary | ICD-10-CM | POA: Diagnosis not present

## 2023-12-03 DIAGNOSIS — F411 Generalized anxiety disorder: Secondary | ICD-10-CM | POA: Diagnosis not present

## 2023-12-04 DIAGNOSIS — F4389 Other reactions to severe stress: Secondary | ICD-10-CM | POA: Diagnosis not present

## 2023-12-04 DIAGNOSIS — F411 Generalized anxiety disorder: Secondary | ICD-10-CM | POA: Diagnosis not present

## 2023-12-06 ENCOUNTER — Encounter

## 2023-12-06 DIAGNOSIS — F331 Major depressive disorder, recurrent, moderate: Secondary | ICD-10-CM | POA: Diagnosis not present

## 2023-12-06 DIAGNOSIS — F411 Generalized anxiety disorder: Secondary | ICD-10-CM | POA: Diagnosis not present

## 2023-12-06 DIAGNOSIS — Z1231 Encounter for screening mammogram for malignant neoplasm of breast: Secondary | ICD-10-CM

## 2023-12-08 NOTE — Therapy (Signed)
 OUTPATIENT PHYSICAL THERAPY FEMALE PELVIC EVALUATION   Patient Name: Melissa [name] MRN: [id number] DOB:1975/05/30, 48 y.o., female Today's Date: 12/09/2023  END OF SESSION:  PT End of Session - 12/09/23 1128     Visit Number 1    Authorization Type waiting for auth- medicaid [place]    PT Start Time 1015    PT Stop Time 1107    PT Time Calculation (min) 52 min    Activity Tolerance Patient tolerated treatment well;Patient limited by pain    Behavior During Therapy [facility] for tasks assessed/performed          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, f/u yearly at [facility] 08-06-2022 stephanie thornton np epic   BV (bacterial vaginosis)    recurring   Chlamydia    Ectopic pregnancy 06/2011   Fibroid    Infertility    Secondary PCOS   PCOS (polycystic ovarian syndrome)    Shingles outbreak 11/2011   Past Surgical History:  Procedure Laterality Date   APPENDECTOMY  2022   DILATION AND CURETTAGE OF UTERUS  06/2011   ectopic   ear tube placement x 3     as child   EAR TUBE REMOVAL     as child   ECTOPIC PREGNANCY SURGERY  2013   LAPAROSCOPY  07/05/2011   Procedure: LAPAROSCOPY OPERATIVE;  Surgeon: Gloris DELENA Hugger, MD;  Location: [facility];  Service: Gynecology;  Laterality: N/A;   right ear surgery Right 2011   benign tumor removed   WISDOM TOOTH EXTRACTION     Patient Active Problem List   Diagnosis Date Noted   Excessive vaginal bleeding 09/04/2022   Intramural, submucous, and subserous leiomyoma of uterus 09/04/2022   Pelvic pain in female 05/31/2022   DUB (dysfunctional uterine bleeding) 12/29/2021   Grief reaction 12/06/2021   Encounter for fertility planning 03/21/2021   S/P appendectomy 10/26/2020   External hemorrhoids 07/19/2020   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   Mixed conductive and sensorineural hearing loss of  right ear with restricted hearing of left ear 08/10/2016   Uterine fibroids, antepartum condition or complication 02/06/2012   Fibroids 08/30/2011   Obesity 12/21/2010    PCP: [name], MD  REFERRING PROVIDER: Jeralyn Crutch, MD  REFERRING DIAG: R10.2,G89.29 (ICD-10-CM) - Chronic pelvic pain in female  THERAPY DIAG:  Muscle weakness (generalized)  Other lack of coordination  Rationale for Evaluation and Treatment: Rehabilitation  ONSET DATE: 2015    SUBJECTIVE:  SUBJECTIVE STATEMENT: Patient reports that her pelvic pain has been going on for a while, had a Sonata  surgery last year for fibroids. Has had this pain but worse in the last 10 years, after she had her boys it got worse.  When she sits too long- 7.5/10 after 20 mins. Has to shift and move around.  Pain with intercourse at times. Not as bad.  Patient reports that she feels heavy in her stomach, it was hard to exercise, has gained weight since her surgery 22 lbs  PAIN:  Are you having pain? Yes NPRS scale: 7/10 Pain location: butt, low back  Pain type: aching and throbbing Pain description: intermittent   Aggravating factors: sitting, intercourse Relieving factors: no sitting, heating bad on everyday bases, puts in in between her leg, cold water , low back  PRECAUTIONS: None  RED FLAGS: None   WEIGHT BEARING RESTRICTIONS: No  FALLS:  Has patient fallen in last 6 months? No  OCCUPATION: Research scientist (medical)  ACTIVITY LEVEL : active with children  PLOF: Independent  PATIENT GOALS: reduced pain with sitting, she is used to it  PERTINENT HISTORY:  Sonata - apr 2024- 2 fibroids size of grapefruit Sexual abuse: No  BOWEL MOVEMENT: occasionally - had a surgery appendicitis 3 /2022, constipated at times Pain  with bowel movement: Yes sometimes Type of bowel movement:11-4 Fully empty rectum: Yes:   Leakage: No Pads: No Fiber supplement/laxative Yes laxative  URINATION: no issues, no leakage  INTERCOURSE:  Ability to have vaginal penetration Yes  Pain with intercourse: yes at times DrynessNo Climax: yes maybe [name] Scale: 1/3 Lubricant: yes at times  PREGNANCY: Vaginal deliveries 3 Tearing Yes: 1st daughter Episiotomy No C-section deliveries no Currently pregnant No- wants more- had a high risk pregnancy and miscarriage 2 years ago  PROLAPSE: None   OBJECTIVE:  Note: Objective measures were completed at Evaluation unless otherwise noted.   PATIENT SURVEYS:   PFIQ-7: to be completed- patient forgot  COGNITION: Overall cognitive status: Within functional limits for tasks assessed     SENSATION: Light touch: Appears intact   FUNCTIONAL TESTS:  Squat: Single leg stance:  Rt:  Lt: Curl-up test:1/3 difficult   GAIT: Assistive device utilized: None Comments: antalgic  POSTURE: rounded shoulders and forward head   LUMBARAROM/PROM:   A/PROM A/PROM  Eval (% available)  Flexion 75  Extension 75  Right lateral flexion 75  Left lateral flexion 75  Right rotation 75  Left rotation 75   (Blank rows = not tested)  PALPATION:   General: right sided abdominal pain, decreased lateral rib expansion  Pelvic Alignment: even  Abdominal: tight and tender abdominal scars                External Perineal Exam: within functional limitations                              Internal Pelvic Floor: good strength present, difficulty with bulging , contracted when asked to lengthen  Patient confirms identification and approves PT to assess internal pelvic floor and treatment Yes Patient confirms identification and approves PT to assess internal pelvic floor and treatment Yes No emotional/communication barriers or cognitive limitation. Patient is motivated to learn. Patient  understands and agrees with treatment goals and plan. PT explains patient will be examined in standing, sitting, and lying down to see how their muscles and joints work. When they are ready, they will be asked to remove their  underwear so PT can examine their perineum. The patient is also given the option of providing their own chaperone as one is not provided in our facility. The patient also has the right and is explained the right to defer or refuse any part of the evaluation or treatment including the internal exam. With the patient's consent, PT will use one gloved finger to gently assess the muscles of the pelvic floor, seeing how well it contracts and relaxes and if there is muscle symmetry. After, the patient will get dressed and PT and patient will discuss exam findings and plan of care. PT and patient discuss plan of care, schedule, attendance policy and HEP activities.     PELVIC MMT:   MMT eval  Vaginal 5/5   Internal Anal Sphincter   External Anal Sphincter   Puborectalis   Diastasis Recti   (Blank rows = not tested)        TONE: average  PROLAPSE: none  TODAY'S TREATMENT:                                                                                                                              DATE:  12/09/2023 EVAL  Manual: abdominal scar massage and cupping  Neuromuscular re-education:diaphragmatic breathing    Therapeutic activities: education on relevant anatomy, diaphragmatic breathing, relationship with diaphragmatic breathing and abdominal lengthening, restricted scars     PATIENT EDUCATION:  Education details: Pt was educated on relevant anatomy, exam findings, HEP, expectations of PT   Person educated: Patient Education method: Explanation, Demonstration, Tactile cues, Verbal cues, and Handouts Education comprehension: verbalized understanding  HOME EXERCISE PROGRAM: [email or web address]  ASSESSMENT:  CLINICAL IMPRESSION: Patient is a 48 y.o. F who was seen  today for physical therapy evaluation and treatment for pelvic pain. Patient has had chronic abdominal and low back pain with sitting for about 10 years and recently increased after 2 abdominal surgeries.Exam findings are notable for upper chest breathing strategies, abdominal weakness and tightness, restrictions throughout many abdominal scars and soft tissue, good strength in pelvic floor muscles. External soft tissues of pelvic floor appear a little dry. Patient demonstrates trunk tightness, bilateral hip good PROM, decreased strength in abdomen, reduced range or motion in lateral rib expansion and pain in right Ql and oblique. It is difficult for patient to transfers due to abdominal weakness and pain and sit for longer than 20 mn=ins due to increased pain . Discussed findings with patient, recommended cupping and scar massage today, patient was educated on relevant anatomy and HEP was initiated. Patient's quality of life has been affected, patient will benefit from physical therapy to address deficits, reduce pain with sitting and intercourse at times, low back pain and return to the gym  and improve quality of life with better recovery after abdominal surgeries.    OBJECTIVE IMPAIRMENTS: decreased activity tolerance, decreased coordination, decreased endurance, decreased mobility, decreased ROM, decreased strength, increased fascial restrictions, increased muscle spasms, impaired flexibility, impaired tone,  improper body mechanics, postural dysfunction, and pain.   ACTIVITY LIMITATIONS: sitting, continence, and caring for others  PARTICIPATION LIMITATIONS: interpersonal relationship and community activity  PERSONAL FACTORS: Time since onset of injury/illness/exacerbation are also affecting patient's functional outcome.   REHAB POTENTIAL: Good  CLINICAL DECISION MAKING: Evolving/moderate complexity  EVALUATION COMPLEXITY: Moderate   GOALS: Goals reviewed with patient? Yes  SHORT TERM GOALS:  Target date: 01/06/2024    Pt will be independent with HEP.   Baseline: Goal status: INITIAL  2.  Patient will be I with abdominal scar and cupping in order to improve restricted scars and improve scar mobility Baseline:  Goal status: INITIAL  3.  Patient will be educated on healthy bowel PT recommendations Baseline:  Goal status: INITIAL  4.  Patient will have increasing bristol stool scale type 3-4 bowel movements to at least 5 times/ week Baseline:  Goal status: INITIAL  5.  Patient will be able to sit for 45 mins without increased low back and glute pain Baseline:  Goal status: INITIAL    LONG TERM GOALS: Target date: 6 months  Pt will be independent with advanced HEP.   Baseline:  Goal status: INITIAL  2.  Pt will have reduced low back pain to max 1/10 in order to be able to do functional activities such as bending, lifting, twisting and walking as needed to be able to take care of her family and participate in job duties.  Baseline: 7/10 Goal status: INITIAL  3.  Patient will demonstrate 4/5 abdominal strength in order to reduce pain and improve transfers Baseline:  Goal status: INITIAL  4.  Pt will report max 1/10 abdominal scar pain with a sit up to more easily get out of bed  Baseline: 7/10 Goal status: INITIAL  5.  Patient will report no pain with intercourse in order to improve relationship with partner Baseline:  Goal status: INITIAL  6.  Patient will be able to sit as long as needed ( at least 2 hours) without increased pain Baseline:  Goal status: INITIAL  PLAN:  PT FREQUENCY: 1-2x/week  PT DURATION: 6 months   PLANNED INTERVENTIONS: 97110-Therapeutic exercises, 97530- Therapeutic activity, [id number]- Neuromuscular re-education, 97535- Self Care, 02859- Manual therapy, 20560 (1-2 muscles), 20561 (3+ muscles), 02886- Aquatic Therapy, and Biofeedback  PLAN FOR NEXT SESSION: abdominal cupping, pelvic floor downtraining, stretches, abdominal  strengthening   Zian Mohamed, PT 12/09/2023, 11:29 AM

## 2023-12-09 ENCOUNTER — Ambulatory Visit: Attending: Obstetrics and Gynecology | Admitting: Physical Therapy

## 2023-12-09 ENCOUNTER — Other Ambulatory Visit: Payer: Self-pay

## 2023-12-09 DIAGNOSIS — R278 Other lack of coordination: Secondary | ICD-10-CM | POA: Diagnosis not present

## 2023-12-09 DIAGNOSIS — R102 Pelvic and perineal pain: Secondary | ICD-10-CM | POA: Insufficient documentation

## 2023-12-09 DIAGNOSIS — M6281 Muscle weakness (generalized): Secondary | ICD-10-CM | POA: Diagnosis not present

## 2023-12-09 DIAGNOSIS — G8929 Other chronic pain: Secondary | ICD-10-CM | POA: Diagnosis not present

## 2023-12-10 DIAGNOSIS — F331 Major depressive disorder, recurrent, moderate: Secondary | ICD-10-CM | POA: Diagnosis not present

## 2023-12-10 DIAGNOSIS — F411 Generalized anxiety disorder: Secondary | ICD-10-CM | POA: Diagnosis not present

## 2023-12-12 DIAGNOSIS — F331 Major depressive disorder, recurrent, moderate: Secondary | ICD-10-CM | POA: Diagnosis not present

## 2023-12-12 DIAGNOSIS — F411 Generalized anxiety disorder: Secondary | ICD-10-CM | POA: Diagnosis not present

## 2023-12-16 ENCOUNTER — Encounter: Payer: Self-pay | Admitting: Physical Therapy

## 2023-12-17 ENCOUNTER — Ambulatory Visit

## 2023-12-18 DIAGNOSIS — F4389 Other reactions to severe stress: Secondary | ICD-10-CM | POA: Diagnosis not present

## 2023-12-18 DIAGNOSIS — F411 Generalized anxiety disorder: Secondary | ICD-10-CM | POA: Diagnosis not present

## 2023-12-18 DIAGNOSIS — F331 Major depressive disorder, recurrent, moderate: Secondary | ICD-10-CM | POA: Diagnosis not present

## 2023-12-19 ENCOUNTER — Ambulatory Visit: Attending: Obstetrics and Gynecology | Admitting: Physical Therapy

## 2023-12-19 DIAGNOSIS — M6281 Muscle weakness (generalized): Secondary | ICD-10-CM | POA: Diagnosis not present

## 2023-12-19 DIAGNOSIS — R278 Other lack of coordination: Secondary | ICD-10-CM | POA: Insufficient documentation

## 2023-12-19 NOTE — Therapy (Signed)
 OUTPATIENT PHYSICAL THERAPY FEMALE PELVIC TREATMENT   Patient Name: Melissa [name] MRN: [id number] DOB:Jan 10, 1976, 48 y.o., female Today's Date: 12/19/2023  END OF SESSION:  PT End of Session - 12/19/23 1235     Visit Number 2    Authorization Type medicaid [place]    Authorization Time Period 10 VISITS 12/09/2023 - 03/07/2024    Authorization - Visit Number 1    Authorization - Number of Visits 10    PT Start Time 1145    PT Stop Time 1230    PT Time Calculation (min) 45 min    Activity Tolerance Patient tolerated treatment well;Patient limited by pain    Behavior During Therapy Melissa [name] for tasks assessed/performed           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, f/u yearly at [facility] 08-06-2022 Melissa thornton np epic   BV (bacterial vaginosis)    recurring   Chlamydia    Ectopic pregnancy 06/2011   Fibroid    Infertility    Secondary PCOS   PCOS (polycystic ovarian syndrome)    Shingles outbreak 11/2011   Past Surgical History:  Procedure Laterality Date   APPENDECTOMY  2022   DILATION AND CURETTAGE OF UTERUS  06/2011   ectopic   ear tube placement x 3     as child   EAR TUBE REMOVAL     as child   ECTOPIC PREGNANCY SURGERY  2013   LAPAROSCOPY  07/05/2011   Procedure: LAPAROSCOPY OPERATIVE;  Surgeon: [name] Melissa Hugger, MD;  Location: [facility];  Service: Gynecology;  Laterality: N/A;   right ear surgery Right 2011   benign tumor removed   WISDOM TOOTH EXTRACTION     Patient Active Problem List   Diagnosis Date Noted   Excessive vaginal bleeding 09/04/2022   Intramural, submucous, and subserous leiomyoma of uterus 09/04/2022   Pelvic pain in female 05/31/2022   DUB (dysfunctional uterine bleeding) 12/29/2021   Grief reaction 12/06/2021   Encounter for fertility planning 03/21/2021   S/P appendectomy 10/26/2020   External hemorrhoids  07/19/2020   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 She is on her cycle today and so her pain is high. 7/10 pain today. She started her cycle on 3 days ago and is feeling it today. No urinary concerns to report. She is taking Metformin  and it makes her nauseous and causing diarrhea. This doesn't happen every time she takes it, but this did happen yesterday.   From eval: Patient reports that her pelvic pain has been going on for a while, had a Sonata  surgery last year for fibroids. Has had this pain but worse in the last 10 years, after she had her boys it got worse.  When she sits too long- 7.5/10 after 20 mins. Has to shift and move around.  Pain with intercourse at times. Not as bad.  Patient reports that she feels heavy in her stomach, it was hard to exercise, has gained weight since her surgery 22 lbs  PAIN:  Are you having pain? Yes NPRS scale: 7/10 Pain location: butt, low back  Pain type: aching and throbbing Pain description: intermittent   Aggravating factors: sitting, intercourse Relieving factors: no sitting, heating bad on everyday bases, puts in in between her leg, cold water ,  low back  PRECAUTIONS: None  RED FLAGS: None   WEIGHT BEARING RESTRICTIONS: No  FALLS:  Has patient fallen in last 6 months? No  OCCUPATION: Research scientist (medical)  ACTIVITY LEVEL : active with children  PLOF: Independent  PATIENT GOALS: reduced pain with sitting, she is used to it  PERTINENT HISTORY:  Sonata - apr 2024- 2 fibroids size of grapefruit Sexual abuse: No  BOWEL MOVEMENT: occasionally - had a surgery appendicitis 3 /2022, constipated at times Pain with bowel movement: Yes sometimes Type of bowel movement:11-4 Fully empty rectum: Yes:   Leakage: No Pads: No Fiber supplement/laxative Yes laxative  URINATION: no issues, no leakage  INTERCOURSE:  Ability to have vaginal penetration Yes  Pain with intercourse: yes at times DrynessNo Climax: yes maybe Melissa [name] Scale: 1/3 Lubricant: yes at times  PREGNANCY: Vaginal deliveries 3 Tearing Yes: 1st daughter Episiotomy No C-section deliveries no Currently pregnant No- wants more- had a high risk pregnancy and miscarriage 2 years ago  PROLAPSE: None   OBJECTIVE:  Note: Objective measures were completed at Evaluation unless otherwise noted.   PATIENT SURVEYS:   PFIQ-7: to be completed- patient forgot  COGNITION: Overall cognitive status: Within functional limits for tasks assessed     SENSATION: Light touch: Appears intact   FUNCTIONAL TESTS:  Squat: Single leg stance:  Rt:  Lt: Curl-up test:1/3 difficult   GAIT: Assistive device utilized: None Comments: antalgic  POSTURE: rounded shoulders and forward head   LUMBARAROM/PROM:   A/PROM A/PROM  Eval (% available)  Flexion 75  Extension 75  Right lateral flexion 75  Left lateral flexion 75  Right rotation 75  Left rotation 75   (Blank rows = not tested)  PALPATION:   General: right sided abdominal pain, decreased lateral rib expansion  Pelvic Alignment: even  Abdominal: tight and tender abdominal scars                 External Perineal Exam: within functional limitations                              Internal Pelvic Floor: good strength present, difficulty with bulging , contracted when asked to lengthen  Patient confirms identification and approves PT to assess internal  pelvic floor and treatment Yes Patient confirms identification and approves PT to assess internal pelvic floor and treatment Yes No emotional/communication barriers or cognitive limitation. Patient is motivated to learn. Patient understands and agrees with treatment goals and plan. PT explains patient will be examined in standing, sitting, and lying down to see how their muscles and joints work. When they are ready, they will be asked to remove their underwear so PT can examine their perineum. The patient is also given the option of providing their own chaperone as one is not provided in our facility. The patient also has the right and is explained the right to defer or refuse any part of the evaluation or treatment including the internal exam. With the patient's consent, PT will use one gloved finger to gently assess the muscles of the pelvic floor, seeing how well it contracts and relaxes and if there is muscle symmetry. After, the patient will get dressed and PT and patient will discuss exam findings and plan of care. PT and patient discuss plan of care, schedule, attendance policy and HEP activities.     PELVIC MMT:   MMT eval  Vaginal 5/5   Internal Anal Sphincter   External Anal Sphincter   Puborectalis   Diastasis Recti   (Blank rows = not tested)        TONE: average  PROLAPSE: none  TODAY'S TREATMENT:                                                                                                                              DATE:  12/19/23: Neuro re-ed: Hooklying diaphragmatic breathing + pelvic floor lengthening and abdominal excursion with inhalation 2x10  Manual therapy:  Manual abdominal massage using cupping device to  promote digestion and decrease muscle tension along large intestine pathway Abdominal scar tissue mobilization using cups to promote tissue mobility and decreased muscle tension around incision sites  Therapeutic exercise: Single knee to chest stretch + diaphragmatic breathing 2x74min  Lower trunk rotations + diaphragmatic breathing 2x10  Supine butterfly stretch +diaphragmatic breathing 2x29min   12/09/2023 EVAL  Manual: abdominal scar massage and cupping  Neuromuscular re-education:diaphragmatic breathing   Therapeutic activities: education on relevant anatomy, diaphragmatic breathing, relationship with diaphragmatic breathing and abdominal lengthening, restricted scars  PATIENT EDUCATION:  Education details: Pt was educated on relevant anatomy, exam findings, HEP, expectations of PT   Person educated: Patient Education method: Explanation, Demonstration, Tactile cues, Verbal cues, and Handouts Education comprehension: verbalized understanding  HOME EXERCISE PROGRAM: Access Code: [email or web address] URL: [email or web address] Date: 12/19/2023 Prepared by: Celena Domino  Exercises - Supine Butterfly Groin Stretch  - 1 x daily - 7 x weekly - 1 sets - hold - Supine Lower Trunk Rotation  - 1 x daily - 7 x weekly - 2 sets - hold - Diaphragmatic Breathing in Child's Pose with Pelvic Floor Relaxation  - 1 x daily - 7 x weekly - 2 sets - 10 reps -  Butterfly Groin Stretch  - 1 x daily - 7 x weekly - 2 sets - hold - Supine Pelvic Floor Stretch - Hands on Knees  - 1 x daily - 7 x weekly - 2 sets - 10 reps - Pelvic Floor Lengthening in Hooklying  - 1 x daily - 7 x weekly - 2 sets - 10 reps - Deep Squat with Pelvic Floor Relaxation  - 1 x daily - 7 x weekly - 2 sets - 10 reps - Diaphragmatic Breathing at 90/90 Supported  - 1 x daily - 7 x weekly - 2 sets - 10 reps - Supine Single Knee to Chest Stretch  - 1 x daily - 7 x weekly - 2 sets - hold  ASSESSMENT:  CLINICAL  IMPRESSION: Patient is a 48 y.o. F who was seen today for physical therapy treatment for pelvic pain. She reports 7/10 pain today at rest in the abdomen. Manual therapy performed to abdominal scars and patient could feel a pulling sensation around her scar sites, no pain. She felt less tension in the abdomen after manual interventions. Downtraining stretches reviewed and progressed with emphasis on diaphragmatic breathing. Patient will benefit from physical therapy to address deficits, reduce pain with sitting and intercourse at times, low back pain and return to the gym  and improve quality of life with better recovery after abdominal surgeries.    OBJECTIVE IMPAIRMENTS: decreased activity tolerance, decreased coordination, decreased endurance, decreased mobility, decreased ROM, decreased strength, increased fascial restrictions, increased muscle spasms, impaired flexibility, impaired tone, improper body mechanics, postural dysfunction, and pain.   ACTIVITY LIMITATIONS: sitting, continence, and caring for others  PARTICIPATION LIMITATIONS: interpersonal relationship and community activity  PERSONAL FACTORS: Time since onset of injury/illness/exacerbation are also affecting patient's functional outcome.   REHAB POTENTIAL: Good  CLINICAL DECISION MAKING: Evolving/moderate complexity  EVALUATION COMPLEXITY: Moderate   GOALS: Goals reviewed with patient? Yes  SHORT TERM GOALS: Target date: 01/06/2024    Pt will be independent with HEP.   Baseline: Goal status: INITIAL  2.  Patient will be I with abdominal scar and cupping in order to improve restricted scars and improve scar mobility Baseline:  Goal status: INITIAL  3.  Patient will be educated on healthy bowel PT recommendations Baseline:  Goal status: INITIAL  4.  Patient will have increasing bristol stool scale type 3-4 bowel movements to at least 5 times/ week Baseline:  Goal status: INITIAL  5.  Patient will be able to sit  for 45 mins without increased low back and glute pain Baseline:  Goal status: INITIAL    LONG TERM GOALS: Target date: 6 months  Pt will be independent with advanced HEP.   Baseline:  Goal status: INITIAL  2.  Pt will have reduced low back pain to max 1/10 in order to be able to do functional activities such as bending, lifting, twisting and walking as needed to be able to take care of her family and participate in job duties.  Baseline: 7/10 Goal status: INITIAL  3.  Patient will demonstrate 4/5 abdominal strength in order to reduce pain and improve transfers Baseline:  Goal status: INITIAL  4.  Pt will report max 1/10 abdominal scar pain with a sit up to more easily get out of bed  Baseline: 7/10 Goal status: INITIAL  5.  Patient will report no pain with intercourse in order to improve relationship with partner Baseline:  Goal status: INITIAL  6.  Patient will be able to sit  as long as needed ( at least 2 hours) without increased pain Baseline:  Goal status: INITIAL  PLAN:  PT FREQUENCY: 1-2x/week  PT DURATION: 6 months   PLANNED INTERVENTIONS: 97110-Therapeutic exercises, 97530- Therapeutic activity, [id number]- Neuromuscular re-education, 97535- Self Care, 02859- Manual therapy, 20560 (1-2 muscles), 20561 (3+ muscles), 02886- Aquatic Therapy, and Biofeedback  PLAN FOR NEXT SESSION: abdominal cupping, pelvic floor downtraining, stretches, abdominal strengthening   Celena Domino, PT, DPT 12/19/23 12:36 PM

## 2023-12-22 DIAGNOSIS — F411 Generalized anxiety disorder: Secondary | ICD-10-CM | POA: Diagnosis not present

## 2023-12-22 DIAGNOSIS — F331 Major depressive disorder, recurrent, moderate: Secondary | ICD-10-CM | POA: Diagnosis not present

## 2023-12-24 DIAGNOSIS — F411 Generalized anxiety disorder: Secondary | ICD-10-CM | POA: Diagnosis not present

## 2023-12-24 DIAGNOSIS — F331 Major depressive disorder, recurrent, moderate: Secondary | ICD-10-CM | POA: Diagnosis not present

## 2023-12-25 ENCOUNTER — Ambulatory Visit: Payer: Self-pay | Admitting: Physical Therapy

## 2023-12-26 ENCOUNTER — Ambulatory Visit

## 2023-12-26 DIAGNOSIS — F331 Major depressive disorder, recurrent, moderate: Secondary | ICD-10-CM | POA: Diagnosis not present

## 2023-12-26 DIAGNOSIS — F411 Generalized anxiety disorder: Secondary | ICD-10-CM | POA: Diagnosis not present

## 2023-12-27 ENCOUNTER — Ambulatory Visit
Admission: RE | Admit: 2023-12-27 | Discharge: 2023-12-27 | Disposition: A | Discharge status: Home / Self Care | Source: Ambulatory Visit | Attending: Family Medicine | Admitting: Family Medicine

## 2023-12-27 ENCOUNTER — Ambulatory Visit

## 2023-12-27 DIAGNOSIS — Z1231 Encounter for screening mammogram for malignant neoplasm of breast: Secondary | ICD-10-CM | POA: Diagnosis not present

## 2023-12-30 DIAGNOSIS — F331 Major depressive disorder, recurrent, moderate: Secondary | ICD-10-CM | POA: Diagnosis not present

## 2023-12-30 DIAGNOSIS — F411 Generalized anxiety disorder: Secondary | ICD-10-CM | POA: Diagnosis not present

## 2024-01-01 DIAGNOSIS — F4389 Other reactions to severe stress: Secondary | ICD-10-CM | POA: Diagnosis not present

## 2024-01-01 DIAGNOSIS — F411 Generalized anxiety disorder: Secondary | ICD-10-CM | POA: Diagnosis not present

## 2024-01-04 DIAGNOSIS — F411 Generalized anxiety disorder: Secondary | ICD-10-CM | POA: Diagnosis not present

## 2024-01-04 DIAGNOSIS — F331 Major depressive disorder, recurrent, moderate: Secondary | ICD-10-CM | POA: Diagnosis not present

## 2024-01-06 ENCOUNTER — Encounter: Payer: Self-pay | Admitting: Physical Therapy

## 2024-01-06 DIAGNOSIS — F331 Major depressive disorder, recurrent, moderate: Secondary | ICD-10-CM | POA: Diagnosis not present

## 2024-01-06 DIAGNOSIS — F411 Generalized anxiety disorder: Secondary | ICD-10-CM | POA: Diagnosis not present

## 2024-01-08 DIAGNOSIS — F411 Generalized anxiety disorder: Secondary | ICD-10-CM | POA: Diagnosis not present

## 2024-01-08 DIAGNOSIS — F331 Major depressive disorder, recurrent, moderate: Secondary | ICD-10-CM | POA: Diagnosis not present

## 2024-01-13 DIAGNOSIS — F411 Generalized anxiety disorder: Secondary | ICD-10-CM | POA: Diagnosis not present

## 2024-01-13 DIAGNOSIS — F331 Major depressive disorder, recurrent, moderate: Secondary | ICD-10-CM | POA: Diagnosis not present

## 2024-01-16 DIAGNOSIS — F411 Generalized anxiety disorder: Secondary | ICD-10-CM | POA: Diagnosis not present

## 2024-01-16 DIAGNOSIS — F331 Major depressive disorder, recurrent, moderate: Secondary | ICD-10-CM | POA: Diagnosis not present

## 2024-01-17 DIAGNOSIS — F411 Generalized anxiety disorder: Secondary | ICD-10-CM | POA: Diagnosis not present

## 2024-01-17 DIAGNOSIS — F4389 Other reactions to severe stress: Secondary | ICD-10-CM | POA: Diagnosis not present

## 2024-01-20 ENCOUNTER — Ambulatory Visit (INDEPENDENT_AMBULATORY_CARE_PROVIDER_SITE_OTHER)

## 2024-01-20 ENCOUNTER — Ambulatory Visit (INDEPENDENT_AMBULATORY_CARE_PROVIDER_SITE_OTHER): Admitting: Podiatry

## 2024-01-20 ENCOUNTER — Ambulatory Visit: Admitting: Podiatry

## 2024-01-20 ENCOUNTER — Encounter: Payer: Self-pay | Admitting: Podiatry

## 2024-01-20 DIAGNOSIS — M7661 Achilles tendinitis, right leg: Secondary | ICD-10-CM | POA: Diagnosis not present

## 2024-01-20 DIAGNOSIS — M7731 Calcaneal spur, right foot: Secondary | ICD-10-CM

## 2024-01-20 DIAGNOSIS — M722 Plantar fascial fibromatosis: Secondary | ICD-10-CM | POA: Diagnosis not present

## 2024-01-20 MED ORDER — TRIAMCINOLONE ACETONIDE 10 MG/ML IJ SUSP
10.0000 mg | Freq: Once | INTRAMUSCULAR | Status: AC
Start: 2024-01-20 — End: 2024-01-20
  Administered 2024-01-20: 10 mg

## 2024-01-20 NOTE — Progress Notes (Signed)
 Patient presents with complaint of pain in the plantar and posterior aspect of the heel right.  Worst pain is on the bottom where it hurts for Melissa [name] in the morning when she stands up or stands up after sitting.  Began several months ago and has been getting worse.  She is on her feet a lot because of work.  She has tried some new shoes.  Does not recall any injury and has not noticed any redness or swelling.   Physical exam:  General appearance: Pleasant, and in no acute distress. AOx3.  Vascular: Pedal pulses: DP 2/4 bilaterally, PT 2/4 bilaterally.  Mild to moderate edema lower legs bilaterally. Capillary fill time immediate bilaterally.  Neurological: Light touch intact feet bilaterally.  Normal Achilles reflex bilaterally.  No clonus or spasticity noted.  Negative Tinel sign tarsal tunnel and porta pedis right  Dermatologic:   Skin normal temperature bilaterally.  Skin normal color, tone, and texture bilaterally.   Musculoskeletal: Tenderness plantar posterior aspect of the heel at the medial plantar calcaneal tubercle.  Plantar and central aspect of the heel plantarly.  Tenderness on the posterior aspect of the heel at the insertion of the Achilles tendon.  No defects of the Achilles tendon noted on the right.  Normal muscle strength lower extremity bilaterally   Radiographs: 3 views foot right: Osteophytic changes posterior aspect calcaneus.  No notes any fractures or dislocations.  No erosive changes noted in the calcaneus.  No evidence any bone tumors.  Normal bone density.  Diagnosis: 1.  Achilles tendinitis right. 2.  Plantar fasciitis right. 3.  Calcaneal spur right.- Plan: -New patient office visit for evaluation and management level 3.  Modifier 25. - Discussed with the Achilles tendinitis and plantar fasciitis.  Recommend injection for the plantar fasciitis.  Gave her an X home exercise instruction she can do for Achilles tendinitis and plantar fasciitis.  Discussed proper  shoes to wear.  Long-term would probably recommend custom orthotics. -RICE -Gave her written exercise instructions she can do at home -injected 3cc 2:1 mixture 0.5 cc Marcaine :Kenolog 10mg /59ml at medial origin plantar fascia at medial plantar calcaneal tubercle right -Will schedule appointment with Trish for custom molded orthotics bilaterally.    Return 2 weeks follow-up injection plantar fascia right

## 2024-01-20 NOTE — Patient Instructions (Signed)

## 2024-01-23 ENCOUNTER — Encounter: Admitting: Physical Therapy

## 2024-01-30 ENCOUNTER — Ambulatory Visit: Attending: Obstetrics and Gynecology | Admitting: Physical Therapy

## 2024-01-30 DIAGNOSIS — F4389 Other reactions to severe stress: Secondary | ICD-10-CM | POA: Diagnosis not present

## 2024-01-30 DIAGNOSIS — F411 Generalized anxiety disorder: Secondary | ICD-10-CM | POA: Diagnosis not present

## 2024-02-03 ENCOUNTER — Ambulatory Visit: Admitting: Podiatry

## 2024-02-06 ENCOUNTER — Encounter: Admitting: Physical Therapy

## 2024-02-12 ENCOUNTER — Ambulatory Visit

## 2024-02-12 NOTE — Progress Notes (Signed)
 Orthotics   Patient was present and evaluated for Custom molded foot orthotics. Patient will benefit from CFO's to provide total contact to BIL MLA's helping to balance and distribute body weight more evenly across BIL feet helping to reduce plantar pressure and pain. Orthotic will also encourage FF / RF alignment  Patient was scanned today and will return for fitting upon receipt  [facility] ABN signed

## 2024-02-13 ENCOUNTER — Ambulatory Visit: Admitting: Physical Therapy

## 2024-02-20 ENCOUNTER — Encounter: Admitting: Physical Therapy

## 2024-02-28 DIAGNOSIS — F4389 Other reactions to severe stress: Secondary | ICD-10-CM | POA: Diagnosis not present

## 2024-03-03 ENCOUNTER — Ambulatory Visit (INDEPENDENT_AMBULATORY_CARE_PROVIDER_SITE_OTHER): Admitting: Obstetrics and Gynecology

## 2024-03-03 ENCOUNTER — Other Ambulatory Visit (HOSPITAL_COMMUNITY)
Admission: RE | Admit: 2024-03-03 | Discharge: 2024-03-03 | Disposition: A | Discharge status: Home / Self Care | Source: Ambulatory Visit | Attending: Obstetrics and Gynecology | Admitting: Obstetrics and Gynecology

## 2024-03-03 ENCOUNTER — Encounter: Payer: Self-pay | Admitting: Obstetrics and Gynecology

## 2024-03-03 ENCOUNTER — Telehealth: Payer: Self-pay

## 2024-03-03 VITALS — BP 133/89 | HR 70 | Wt 243.0 lb

## 2024-03-03 DIAGNOSIS — N760 Acute vaginitis: Secondary | ICD-10-CM | POA: Insufficient documentation

## 2024-03-03 DIAGNOSIS — L292 Pruritus vulvae: Secondary | ICD-10-CM

## 2024-03-03 DIAGNOSIS — N939 Abnormal uterine and vaginal bleeding, unspecified: Secondary | ICD-10-CM | POA: Diagnosis not present

## 2024-03-03 DIAGNOSIS — F4389 Other reactions to severe stress: Secondary | ICD-10-CM | POA: Diagnosis not present

## 2024-03-03 MED ORDER — CLOBETASOL PROPIONATE 0.05 % EX CREA
1.0000 | TOPICAL_CREAM | Freq: Every day | CUTANEOUS | 2 refills | Status: AC
Start: 2024-03-03 — End: ?

## 2024-03-03 MED ORDER — HYDROXYZINE HCL 25 MG PO TABS
25.0000 mg | ORAL_TABLET | Freq: Every evening | ORAL | 0 refills | Status: AC | PRN
Start: 2024-03-03 — End: ?

## 2024-03-03 NOTE — Progress Notes (Unsigned)
 GYNECOLOGY VISIT  Patient name: Melissa [name] MRN [id number]  Date of birth: [birth year] Chief Complaint:   Gynecologic Exam Presents with vaginal itching and menstrual changes History:    #vulvar itching:   Itching only on the outside. Intermittent occurrence. Has tried coconut oil without relief. Concerned about skin dryness. Itching is so bad she is cutting herself from the scratching. New laundry detergent tried - typically using amorall but now using tide pods now. in September no menses and vagina itching bad; tried boric acid which helpd for a while Talked to someone who mentioned it could be perimenopause or STI. More stress recently. Coco butter and coconut oil - with minimal relief. Coritonse cream tried with no improvement. Pain after scratching the skin as well. Burning when in the shower will exacerbate the sensation of itching. 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 Liquid cortixone but burns due to alcohol content. Menses also currently late. Last UPT taken was a few months ago. Not sure of the skin due to not able to see it due to pannus. No increased voiding.  Not waking up due to itching. Putting a heating pad to keep the symptoms at bay or sometimes a wash clot.  No abnormal discharge now. Monogamous relationship. Started in September.   #menstrual changes: concerned about pregnancy due to nausea and sore breast, negative UPT followed by light pink spotting. Unsuual smell with the spotting which as now resolved.Didn't have a period for a a whole month.Menses - soaking 2 pads in 2-3 hours. Eating ice currently. Getting dizzy from time to time. Also feeling very fatigued.  Bloating which she think associated with priro surgery and feeling intemrittend ziessinss. Menses 3d late this month.   The following portions of the patient's history were reviewed and updated as appropriate: allergies, current  medications, past family history, past medical history, past social history, past surgical history and problem list.   Health Maintenance:   Last pap     Component Value Date/Time   DIAGPAP  08/29/2022 1343    - Negative for intraepithelial lesion or malignancy (NILM)   DIAGPAP  03/16/2019 1105    - Negative for intraepithelial lesion or malignancy (NILM)   HPVHIGH Negative 08/29/2022 1343   HPVHIGH Negative 03/16/2019 1105   ADEQPAP  08/29/2022 1343    Satisfactory for evaluation; transformation zone component PRESENT.   ADEQPAP  03/16/2019 1105    Satisfactory for evaluation; transformation zone component PRESENT.    Health Maintenance  Topic Date Due  . Hepatitis B Vaccine (1 of 3 - 19+ 3-dose series) Never done  . HPV Vaccine (1 - 3-dose SCDM series) Never done  . DTaP/Tdap/Td vaccine (2 - Td or Tdap) 05/31/2022  . Flu Shot  12/13/2023  . COVID-19 Vaccine (1 - 2025-26 season) Never done  . Breast Cancer Screening  12/26/2025  . Pap with HPV screening  08/29/2027  . Colon Cancer Screening  07/13/2030  . Hepatitis C Screening  Completed  . HIV Screening  Completed  . Pneumococcal Vaccine  Aged Out  . Meningitis B Vaccine  Aged Out      Review of Systems:  Pertinent items are noted in HPI. Comprehensive review of systems was otherwise negative.   Objective:  Physical Exam BP 133/89   Pulse 70   Wt 243 lb (110.2 kg)   BMI 41.71 kg/m    Physical Exam Vitals and nursing note reviewed.  Exam conducted with a chaperone present.  Constitutional:      Appearance: Normal appearance.  HENT:     Head: Normocephalic and atraumatic.  Pulmonary:     Effort: Pulmonary effort is normal.     Breath sounds: Normal breath sounds.  Genitourinary:    General: Normal vulva.     Exam position: Lithotomy position.     Vagina: Normal.     Cervix: Normal.     Comments: Lichenification o fbilateral labia majora Skin:    General: Skin is warm and dry.  Neurological:     General:  No focal deficit present.     Mental Status: She is alert.  Psychiatric:        Mood and Affect: Mood normal.        Behavior: Behavior normal.        Thought Content: Thought content normal.        Judgment: Judgment normal.      Labs and Imaging No results found.     Assessment & Plan:  Assessment and Plan Assessment & Plan        *** Routine preventative health maintenance measures emphasized.  Carter Quarry, MD Minimally Invasive Gynecologic [facility] for [facility], [facility]

## 2024-03-03 NOTE — Telephone Encounter (Signed)
 Orthotics are here Charges not entered yet No financial form signed or on file Appt set for 10/27

## 2024-03-04 ENCOUNTER — Ambulatory Visit: Payer: Self-pay | Admitting: Obstetrics and Gynecology

## 2024-03-04 LAB — CBC
Hematocrit: 38.9 % (ref 34.0–46.6)
Hemoglobin: 11.9 g/dL (ref 11.1–15.9)
MCH: 25.5 pg — ABNORMAL LOW (ref 26.6–33.0)
MCHC: 30.6 g/dL — ABNORMAL LOW (ref 31.5–35.7)
MCV: 84 fL (ref 79–97)
Platelets: 418 x10E3/uL (ref 150–450)
RBC: 4.66 x10E6/uL (ref 3.77–5.28)
RDW: 16.1 % — ABNORMAL HIGH (ref 11.7–15.4)
WBC: 4.1 x10E3/uL (ref 3.4–10.8)

## 2024-03-04 LAB — CERVICOVAGINAL ANCILLARY ONLY
Bacterial Vaginitis (gardnerella): POSITIVE — AB
Candida Glabrata: NEGATIVE
Candida Vaginitis: NEGATIVE
Chlamydia: NEGATIVE
Comment: NEGATIVE
Comment: NEGATIVE
Comment: NEGATIVE
Comment: NEGATIVE
Comment: NEGATIVE
Comment: NORMAL
Neisseria Gonorrhea: NEGATIVE
Trichomonas: NEGATIVE

## 2024-03-04 LAB — FOLLICLE STIMULATING HORMONE: FSH: 3.2 m[IU]/mL

## 2024-03-04 LAB — HEMOGLOBIN A1C
Est. average glucose Bld gHb Est-mCnc: 108 mg/dL
Hgb A1c MFr Bld: 5.4 % (ref 4.8–5.6)

## 2024-03-05 ENCOUNTER — Telehealth: Payer: Self-pay

## 2024-03-05 ENCOUNTER — Other Ambulatory Visit: Payer: Self-pay

## 2024-03-05 DIAGNOSIS — Z3200 Encounter for pregnancy test, result unknown: Secondary | ICD-10-CM

## 2024-03-05 NOTE — Telephone Encounter (Addendum)
 RN called [facility], spoke with Graylin, to request beta hcg be added to pt lab work from 03/03/24 per Dr. Jeralyn.   Waddell, RN    ----- Message from Carter Jeralyn sent at 03/04/2024  5:58 PM EDT ----- Regarding: add on Hello,  Is it possible to do an add on HCG from the blood sent yesterday? Thanks,  Ajewole

## 2024-03-07 LAB — SPECIMEN STATUS REPORT

## 2024-03-07 LAB — BETA HCG QUANT (REF LAB): hCG Quant: <1 m[IU]/mL

## 2024-03-09 ENCOUNTER — Ambulatory Visit (INDEPENDENT_AMBULATORY_CARE_PROVIDER_SITE_OTHER)

## 2024-03-09 DIAGNOSIS — M7661 Achilles tendinitis, right leg: Secondary | ICD-10-CM | POA: Diagnosis not present

## 2024-03-09 DIAGNOSIS — M2142 Flat foot [pes planus] (acquired), left foot: Secondary | ICD-10-CM | POA: Diagnosis not present

## 2024-03-09 DIAGNOSIS — M2141 Flat foot [pes planus] (acquired), right foot: Secondary | ICD-10-CM

## 2024-03-09 DIAGNOSIS — M722 Plantar fascial fibromatosis: Secondary | ICD-10-CM | POA: Diagnosis not present

## 2024-03-09 DIAGNOSIS — F4389 Other reactions to severe stress: Secondary | ICD-10-CM | POA: Diagnosis not present

## 2024-03-09 NOTE — Progress Notes (Signed)
 Patient presents today to pick up custom molded foot orthotics, diagnosed with PF right and Achilles tendonitis    by Dr. Christine.   Orthotics were dispensed and fit was satisfactory. Reviewed instructions for break-in and wear. Written instructions given to patient.  Patient will follow up as needed.   Lolita Schultze Cped, CFo, CFm

## 2024-03-13 LAB — TSH RFX ON ABNORMAL TO FREE T4

## 2024-03-17 DIAGNOSIS — F411 Generalized anxiety disorder: Secondary | ICD-10-CM | POA: Diagnosis not present

## 2024-03-17 DIAGNOSIS — F4389 Other reactions to severe stress: Secondary | ICD-10-CM | POA: Diagnosis not present

## 2024-04-01 ENCOUNTER — Other Ambulatory Visit: Payer: Self-pay

## 2024-04-01 ENCOUNTER — Ambulatory Visit: Admitting: Obstetrics and Gynecology

## 2024-04-01 ENCOUNTER — Encounter: Payer: Self-pay | Admitting: Obstetrics and Gynecology

## 2024-04-01 VITALS — BP 113/80 | HR 77 | Wt 244.3 lb

## 2024-04-01 DIAGNOSIS — N939 Abnormal uterine and vaginal bleeding, unspecified: Secondary | ICD-10-CM

## 2024-04-01 NOTE — Progress Notes (Signed)
 GYNECOLOGY VISIT  Patient name: Melissa [name] MRN [id number]  Date of birth: 1976/02/08 Chief Complaint:   Follow-up  History:  Discussed the use of AI scribe software for clinical note transcription with the patient, who gave verbal consent to proceed.  History of Present Illness Melissa [name] is a 48 year old female who presents with irregular menstrual bleeding and associated symptoms.  She describes her menstrual bleeding as irregular, starting off light and then becoming medium in flow. The bleeding is sometimes accompanied by a foul odor and headaches. The bleeding can become heavy, requiring the use of multiple pads within a two-hour span or diapers. The last heavy bleeding episode occurred in September. Her periods are unpredictable, varying in heaviness from cycle to cycle. On 11/13 started to have light bleeding/spotting.   She experiences nausea with each period, describing it as 'nausea like throw up, just want to sit right at the tip or the mouth start watering.' She also reports headaches that coincide with her menstrual cycle, which have been worsening.  She wants to have more children and has been trying to conceive since losing a child in 2023. Her partner has type 1 diabetes.  She reports frequent belching and is currently taking Protonix . She suspects the belching may be related to reflux.  She used a cream for itching, which she applied two to three times daily after showers. The cream was effective, and she last used it on Monday of last week. No additional itching since then.   The following portions of the patient's history were reviewed and updated as appropriate: allergies, current medications, past family history, past medical history, past social history, past surgical history and problem list.   Health Maintenance:   Last pap     Component Value Date/Time   DIAGPAP  08/29/2022 1343    - Negative for intraepithelial lesion or malignancy (NILM)   DIAGPAP   03/16/2019 1105    - Negative for intraepithelial lesion or malignancy (NILM)   HPVHIGH Negative 08/29/2022 1343   HPVHIGH Negative 03/16/2019 1105   ADEQPAP  08/29/2022 1343    Satisfactory for evaluation; transformation zone component PRESENT.   ADEQPAP  03/16/2019 1105    Satisfactory for evaluation; transformation zone component PRESENT.    Health Maintenance  Topic Date Due   Hepatitis B Vaccine (1 of 3 - 19+ 3-dose series) Never done   HPV Vaccine (1 - 3-dose SCDM series) Never done   DTaP/Tdap/Td vaccine (2 - Td or Tdap) 05/31/2022   Flu Shot  12/13/2023   COVID-19 Vaccine (1 - 2025-26 season) Never done   Breast Cancer Screening  12/26/2025   Pap with HPV screening  08/29/2027   Colon Cancer Screening  07/13/2030   Hepatitis C Screening  Completed   HIV Screening  Completed   Pneumococcal Vaccine  Aged Out   Meningitis B Vaccine  Aged Out      Review of Systems:  Pertinent items are noted in HPI. Comprehensive review of systems was otherwise negative.   Objective:  Physical Exam BP 113/80   Pulse 77   Wt 244 lb 4.8 oz (110.8 kg)   BMI 41.93 kg/m    Physical Exam Vitals and nursing note reviewed.  Constitutional:      Appearance: Normal appearance.  HENT:     Head: Normocephalic and atraumatic.  Pulmonary:     Effort: Pulmonary effort is normal.  Genitourinary:    Comments: Minimal lichenification of vulvar skin Skin:  General: Skin is warm and dry.  Neurological:     General: No focal deficit present.     Mental Status: She is alert.  Psychiatric:        Mood and Affect: Mood normal.        Behavior: Behavior normal.        Thought Content: Thought content normal.        Judgment: Judgment normal.      Labs and Imaging IMPRESSION: 1. Fibroid uterus as above. Further evaluation with contrast-enhanced MRI of the pelvis may be of clinical benefit in regards to better delineation of the fibroids in relation to the uterine myometrium and  endometrium.   2. Nonvisualization of bilateral ovaries presumably secondary to overlying bowel gas. No suspicious or worrisome adnexal mass.     Assessment & Plan:   Assessment & Plan Abnormal uterine bleeding with menstrual-related headache and nausea Intermittent abnormal uterine bleeding with nausea and headaches suggest hormonal fluctuations as menopause approaches. Hemoglobin normal, iron levels checked to rule out deficiency. Headaches and nausea likely due to estrogen drop-off during menstrual cycles. - Checked iron levels today. - Refilled Zofran  for nausea as needed. - Consider TXA or high-dose NSAID with Zofran  for heavy periods. - Consider aygestin  to stabilize hormone levels and reduce menstrual variability.  Pruritus vulvae, resolved Pruritus vulvae resolved with topical steroid cream. Use topical steroid cream as needed for future episodes.  Female infertility Infertility with desire for more children. Partner has type 1 diabetes. Previous consultation with fertility facility. Considering ovulation induction and potential referral to fertility specialist. Discussed [facility] as an option with in-house financing. - Provided information on [facility] for potential consultation. - Consider ovulation induction and referral to fertility specialist if needed. - Emphasized need for intervention sooner rather than later given waning menses and increasing age  Gastroesophageal reflux disease (GERD) Reports of belching, possibly indicative of GERD. Currently taking Protonix . - Continue Protonix  for GERD management.    Carter Quarry, MD Minimally Invasive Gynecologic [facility] for [facility], [facility]

## 2024-04-01 NOTE — Patient Instructions (Addendum)
 [facility] and Melissa [name] are additional optins

## 2024-04-02 ENCOUNTER — Ambulatory Visit: Payer: Self-pay | Admitting: Obstetrics and Gynecology

## 2024-04-02 LAB — IRON,TIBC AND FERRITIN PANEL
Ferritin: 14 ng/mL — ABNORMAL LOW (ref 15–150)
Iron Saturation: 14 % — ABNORMAL LOW (ref 15–55)
Iron: 57 ug/dL (ref 27–159)
Total Iron Binding Capacity: 396 ug/dL (ref 250–450)
UIBC: 339 ug/dL (ref 131–425)

## 2024-04-08 DIAGNOSIS — F4389 Other reactions to severe stress: Secondary | ICD-10-CM | POA: Diagnosis not present

## 2024-04-22 DIAGNOSIS — F4389 Other reactions to severe stress: Secondary | ICD-10-CM | POA: Diagnosis not present

## 2024-04-22 DIAGNOSIS — F411 Generalized anxiety disorder: Secondary | ICD-10-CM | POA: Diagnosis not present

## 2024-05-06 DIAGNOSIS — J029 Acute pharyngitis, unspecified: Secondary | ICD-10-CM | POA: Diagnosis not present

## 2024-05-06 DIAGNOSIS — M7918 Myalgia, other site: Secondary | ICD-10-CM | POA: Diagnosis not present

## 2024-05-06 DIAGNOSIS — H66001 Acute suppurative otitis media without spontaneous rupture of ear drum, right ear: Secondary | ICD-10-CM | POA: Diagnosis not present

## 2024-05-06 DIAGNOSIS — R051 Acute cough: Secondary | ICD-10-CM | POA: Diagnosis not present

## 2024-05-06 DIAGNOSIS — R519 Headache, unspecified: Secondary | ICD-10-CM | POA: Diagnosis not present

## 2024-05-06 DIAGNOSIS — J101 Influenza due to other identified influenza virus with other respiratory manifestations: Secondary | ICD-10-CM | POA: Diagnosis not present

## 2024-05-12 DIAGNOSIS — R051 Acute cough: Secondary | ICD-10-CM | POA: Diagnosis not present

## 2024-05-12 DIAGNOSIS — J101 Influenza due to other identified influenza virus with other respiratory manifestations: Secondary | ICD-10-CM | POA: Diagnosis not present
